# Patient Record
Sex: Female | Born: 1975 | Race: Black or African American | Hispanic: No | Marital: Married | Smoking: Never smoker
Health system: Southern US, Community
[De-identification: ages and names within clinical notes are randomized; demographics above are authoritative.]

## PROBLEM LIST (undated history)

## (undated) DIAGNOSIS — R599 Enlarged lymph nodes, unspecified: Secondary | ICD-10-CM

## (undated) DIAGNOSIS — N920 Excessive and frequent menstruation with regular cycle: Secondary | ICD-10-CM

## (undated) DIAGNOSIS — M1990 Unspecified osteoarthritis, unspecified site: Secondary | ICD-10-CM

## (undated) DIAGNOSIS — D367 Benign neoplasm of other specified sites: Secondary | ICD-10-CM

## (undated) DIAGNOSIS — I739 Peripheral vascular disease, unspecified: Secondary | ICD-10-CM

## (undated) DIAGNOSIS — I639 Cerebral infarction, unspecified: Secondary | ICD-10-CM

## (undated) DIAGNOSIS — K922 Gastrointestinal hemorrhage, unspecified: Secondary | ICD-10-CM

## (undated) DIAGNOSIS — D649 Anemia, unspecified: Secondary | ICD-10-CM

## (undated) DIAGNOSIS — I10 Essential (primary) hypertension: Secondary | ICD-10-CM

## (undated) DIAGNOSIS — F419 Anxiety disorder, unspecified: Secondary | ICD-10-CM

## (undated) DIAGNOSIS — I82409 Acute embolism and thrombosis of unspecified deep veins of unspecified lower extremity: Secondary | ICD-10-CM

## (undated) DIAGNOSIS — I6522 Occlusion and stenosis of left carotid artery: Secondary | ICD-10-CM

## (undated) HISTORY — PX: OTHER SURGICAL HISTORY: SHX169

## (undated) HISTORY — DX: Occlusion and stenosis of left carotid artery: I65.22

## (undated) HISTORY — PX: BREAST SURGERY: SHX581

## (undated) HISTORY — DX: Anemia, unspecified: D64.9

## (undated) HISTORY — DX: Excessive and frequent menstruation with regular cycle: N92.0

---

## 2004-11-26 DIAGNOSIS — R599 Enlarged lymph nodes, unspecified: Secondary | ICD-10-CM

## 2004-11-26 HISTORY — DX: Enlarged lymph nodes, unspecified: R59.9

## 2005-09-22 ENCOUNTER — Emergency Department (HOSPITAL_COMMUNITY): Admission: EM | Admit: 2005-09-22 | Discharge: 2005-09-22 | Payer: Self-pay | Admitting: Family Medicine

## 2005-10-01 ENCOUNTER — Emergency Department (HOSPITAL_COMMUNITY): Admission: EM | Admit: 2005-10-01 | Discharge: 2005-10-01 | Payer: Self-pay | Admitting: Family Medicine

## 2005-10-09 ENCOUNTER — Emergency Department (HOSPITAL_COMMUNITY): Admission: EM | Admit: 2005-10-09 | Discharge: 2005-10-09 | Payer: Self-pay | Admitting: Family Medicine

## 2005-10-19 ENCOUNTER — Ambulatory Visit (HOSPITAL_COMMUNITY): Admission: RE | Admit: 2005-10-19 | Discharge: 2005-10-19 | Payer: Self-pay | Admitting: Family Medicine

## 2005-10-19 ENCOUNTER — Ambulatory Visit: Payer: Self-pay | Admitting: Internal Medicine

## 2005-10-19 ENCOUNTER — Inpatient Hospital Stay (HOSPITAL_COMMUNITY): Admission: EM | Admit: 2005-10-19 | Discharge: 2005-10-25 | Payer: Self-pay | Admitting: Family Medicine

## 2005-10-22 ENCOUNTER — Encounter (INDEPENDENT_AMBULATORY_CARE_PROVIDER_SITE_OTHER): Payer: Self-pay | Admitting: *Deleted

## 2005-12-10 ENCOUNTER — Ambulatory Visit (HOSPITAL_COMMUNITY): Admission: RE | Admit: 2005-12-10 | Discharge: 2005-12-10 | Payer: Self-pay | Admitting: Hospitalist

## 2005-12-10 ENCOUNTER — Ambulatory Visit: Payer: Self-pay | Admitting: Hospitalist

## 2005-12-14 ENCOUNTER — Ambulatory Visit (HOSPITAL_COMMUNITY): Admission: RE | Admit: 2005-12-14 | Discharge: 2005-12-14 | Payer: Self-pay | Admitting: Cardiology

## 2005-12-14 ENCOUNTER — Encounter: Payer: Self-pay | Admitting: Cardiology

## 2005-12-14 ENCOUNTER — Ambulatory Visit: Payer: Self-pay | Admitting: Cardiology

## 2005-12-20 ENCOUNTER — Emergency Department (HOSPITAL_COMMUNITY): Admission: EM | Admit: 2005-12-20 | Discharge: 2005-12-20 | Payer: Self-pay | Admitting: Emergency Medicine

## 2005-12-21 ENCOUNTER — Inpatient Hospital Stay (HOSPITAL_COMMUNITY): Admission: EM | Admit: 2005-12-21 | Discharge: 2005-12-26 | Payer: Self-pay | Admitting: Emergency Medicine

## 2005-12-21 ENCOUNTER — Ambulatory Visit (HOSPITAL_COMMUNITY): Admission: RE | Admit: 2005-12-21 | Discharge: 2005-12-21 | Payer: Self-pay | Admitting: Emergency Medicine

## 2005-12-21 ENCOUNTER — Ambulatory Visit: Payer: Self-pay | Admitting: Internal Medicine

## 2005-12-31 ENCOUNTER — Ambulatory Visit: Payer: Self-pay | Admitting: Internal Medicine

## 2006-01-08 ENCOUNTER — Ambulatory Visit: Payer: Self-pay | Admitting: Internal Medicine

## 2006-01-14 ENCOUNTER — Ambulatory Visit: Payer: Self-pay | Admitting: Internal Medicine

## 2006-01-28 ENCOUNTER — Ambulatory Visit: Payer: Self-pay | Admitting: Hospitalist

## 2006-02-09 ENCOUNTER — Ambulatory Visit: Payer: Self-pay | Admitting: Internal Medicine

## 2006-02-09 ENCOUNTER — Inpatient Hospital Stay (HOSPITAL_COMMUNITY): Admission: EM | Admit: 2006-02-09 | Discharge: 2006-02-14 | Payer: Self-pay | Admitting: Emergency Medicine

## 2006-02-15 ENCOUNTER — Ambulatory Visit: Payer: Self-pay | Admitting: Internal Medicine

## 2006-02-18 ENCOUNTER — Ambulatory Visit: Payer: Self-pay | Admitting: Internal Medicine

## 2006-02-20 ENCOUNTER — Ambulatory Visit: Payer: Self-pay | Admitting: Internal Medicine

## 2006-02-25 ENCOUNTER — Ambulatory Visit: Payer: Self-pay | Admitting: Hospitalist

## 2006-03-07 ENCOUNTER — Ambulatory Visit: Payer: Self-pay | Admitting: Internal Medicine

## 2006-03-11 ENCOUNTER — Ambulatory Visit: Payer: Self-pay | Admitting: Internal Medicine

## 2006-03-18 ENCOUNTER — Ambulatory Visit: Payer: Self-pay | Admitting: Internal Medicine

## 2006-03-25 ENCOUNTER — Ambulatory Visit: Payer: Self-pay | Admitting: Internal Medicine

## 2006-04-01 ENCOUNTER — Ambulatory Visit: Payer: Self-pay | Admitting: Internal Medicine

## 2006-04-19 ENCOUNTER — Encounter: Admission: RE | Admit: 2006-04-19 | Discharge: 2006-04-19 | Payer: Self-pay

## 2006-04-30 ENCOUNTER — Ambulatory Visit: Payer: Self-pay | Admitting: Internal Medicine

## 2006-05-04 ENCOUNTER — Ambulatory Visit (HOSPITAL_COMMUNITY): Admission: RE | Admit: 2006-05-04 | Discharge: 2006-05-04 | Payer: Self-pay | Admitting: Emergency Medicine

## 2006-05-04 ENCOUNTER — Encounter: Payer: Self-pay | Admitting: Vascular Surgery

## 2006-06-08 ENCOUNTER — Encounter: Payer: Self-pay | Admitting: Vascular Surgery

## 2006-06-08 ENCOUNTER — Emergency Department (HOSPITAL_COMMUNITY): Admission: EM | Admit: 2006-06-08 | Discharge: 2006-06-08 | Payer: Self-pay | Admitting: Emergency Medicine

## 2006-06-10 ENCOUNTER — Ambulatory Visit: Payer: Self-pay | Admitting: Internal Medicine

## 2006-06-10 ENCOUNTER — Ambulatory Visit (HOSPITAL_COMMUNITY): Admission: RE | Admit: 2006-06-10 | Discharge: 2006-06-10 | Payer: Self-pay | Admitting: Internal Medicine

## 2006-09-04 DIAGNOSIS — N83209 Unspecified ovarian cyst, unspecified side: Secondary | ICD-10-CM | POA: Insufficient documentation

## 2006-09-04 DIAGNOSIS — F3289 Other specified depressive episodes: Secondary | ICD-10-CM | POA: Insufficient documentation

## 2006-09-04 DIAGNOSIS — I749 Embolism and thrombosis of unspecified artery: Secondary | ICD-10-CM | POA: Insufficient documentation

## 2006-09-04 DIAGNOSIS — K5900 Constipation, unspecified: Secondary | ICD-10-CM | POA: Insufficient documentation

## 2006-09-04 DIAGNOSIS — M314 Aortic arch syndrome [Takayasu]: Secondary | ICD-10-CM

## 2006-09-04 DIAGNOSIS — M216X9 Other acquired deformities of unspecified foot: Secondary | ICD-10-CM

## 2006-09-04 DIAGNOSIS — F329 Major depressive disorder, single episode, unspecified: Secondary | ICD-10-CM

## 2006-09-04 DIAGNOSIS — D509 Iron deficiency anemia, unspecified: Secondary | ICD-10-CM

## 2006-09-14 ENCOUNTER — Encounter: Payer: Self-pay | Admitting: Vascular Surgery

## 2006-09-14 ENCOUNTER — Ambulatory Visit (HOSPITAL_COMMUNITY): Admission: RE | Admit: 2006-09-14 | Discharge: 2006-09-14 | Payer: Self-pay | Admitting: Family Medicine

## 2006-10-29 ENCOUNTER — Ambulatory Visit (HOSPITAL_COMMUNITY): Admission: RE | Admit: 2006-10-29 | Discharge: 2006-10-29 | Payer: Self-pay | Admitting: Vascular Surgery

## 2006-11-05 ENCOUNTER — Ambulatory Visit (HOSPITAL_COMMUNITY): Admission: RE | Admit: 2006-11-05 | Discharge: 2006-11-05 | Payer: Self-pay | Admitting: Vascular Surgery

## 2006-11-07 ENCOUNTER — Emergency Department (HOSPITAL_COMMUNITY): Admission: EM | Admit: 2006-11-07 | Discharge: 2006-11-08 | Payer: Self-pay | Admitting: Emergency Medicine

## 2006-11-08 ENCOUNTER — Ambulatory Visit (HOSPITAL_COMMUNITY): Admission: RE | Admit: 2006-11-08 | Discharge: 2006-11-08 | Payer: Self-pay | Admitting: *Deleted

## 2007-09-19 ENCOUNTER — Telehealth: Payer: Self-pay | Admitting: *Deleted

## 2008-01-02 ENCOUNTER — Ambulatory Visit: Payer: Self-pay | Admitting: Vascular Surgery

## 2008-01-02 ENCOUNTER — Encounter (INDEPENDENT_AMBULATORY_CARE_PROVIDER_SITE_OTHER): Payer: Self-pay | Admitting: Emergency Medicine

## 2008-01-02 ENCOUNTER — Ambulatory Visit (HOSPITAL_COMMUNITY): Admission: RE | Admit: 2008-01-02 | Discharge: 2008-01-02 | Payer: Self-pay | Admitting: Emergency Medicine

## 2008-02-03 ENCOUNTER — Emergency Department (HOSPITAL_COMMUNITY): Admission: EM | Admit: 2008-02-03 | Discharge: 2008-02-03 | Payer: Self-pay | Admitting: Family Medicine

## 2008-05-18 ENCOUNTER — Emergency Department (HOSPITAL_COMMUNITY): Admission: EM | Admit: 2008-05-18 | Discharge: 2008-05-18 | Payer: Self-pay | Admitting: Emergency Medicine

## 2009-01-11 ENCOUNTER — Emergency Department (HOSPITAL_COMMUNITY): Admission: EM | Admit: 2009-01-11 | Discharge: 2009-01-11 | Payer: Self-pay | Admitting: Emergency Medicine

## 2009-01-16 ENCOUNTER — Emergency Department (HOSPITAL_COMMUNITY): Admission: EM | Admit: 2009-01-16 | Discharge: 2009-01-16 | Payer: Self-pay | Admitting: Emergency Medicine

## 2009-02-02 ENCOUNTER — Ambulatory Visit: Payer: Self-pay | Admitting: Vascular Surgery

## 2010-12-12 ENCOUNTER — Encounter (INDEPENDENT_AMBULATORY_CARE_PROVIDER_SITE_OTHER): Payer: Self-pay | Admitting: Internal Medicine

## 2010-12-12 ENCOUNTER — Encounter (INDEPENDENT_AMBULATORY_CARE_PROVIDER_SITE_OTHER): Payer: Self-pay | Admitting: Emergency Medicine

## 2010-12-12 ENCOUNTER — Inpatient Hospital Stay (HOSPITAL_COMMUNITY)
Admission: EM | Admit: 2010-12-12 | Discharge: 2010-12-13 | Payer: Self-pay | Source: Home / Self Care | Attending: Internal Medicine | Admitting: Internal Medicine

## 2010-12-13 LAB — LIPID PANEL
Cholesterol: 122 mg/dL (ref 0–200)
HDL: 63 mg/dL (ref >39)
LDL Cholesterol: 52 mg/dL (ref 0–99)
Total CHOL/HDL Ratio: 1.9 RATIO
Triglycerides: 35 mg/dL (ref <150)
VLDL: 7 mg/dL (ref 0–40)

## 2010-12-13 LAB — PROTIME-INR
INR: 1.2 (ref 0.00–1.49)
INR: 1.24 (ref 0.00–1.49)
Prothrombin Time: 15.5 seconds — ABNORMAL HIGH (ref 11.6–15.2)
Prothrombin Time: 15.8 seconds — ABNORMAL HIGH (ref 11.6–15.2)

## 2010-12-13 LAB — COMPREHENSIVE METABOLIC PANEL
ALT: <8 U/L (ref 0–35)
AST: 15 U/L (ref 0–37)
Albumin: 2.9 g/dL — ABNORMAL LOW (ref 3.5–5.2)
Alkaline Phosphatase: 66 U/L (ref 39–117)
BUN: 5 mg/dL — ABNORMAL LOW (ref 6–23)
CO2: 25 mEq/L (ref 19–32)
Calcium: 8.6 mg/dL (ref 8.4–10.5)
Chloride: 106 mEq/L (ref 96–112)
Creatinine, Ser: 0.71 mg/dL (ref 0.4–1.2)
GFR calc Af Amer: >60 mL/min (ref >60)
GFR calc non Af Amer: >60 mL/min (ref >60)
Glucose, Bld: 108 mg/dL — ABNORMAL HIGH (ref 70–99)
Potassium: 3.5 mEq/L (ref 3.5–5.1)
Sodium: 139 mEq/L (ref 135–145)
Total Bilirubin: 0.2 mg/dL — ABNORMAL LOW (ref 0.3–1.2)
Total Protein: 8 g/dL (ref 6.0–8.3)

## 2010-12-13 LAB — CK TOTAL AND CKMB (NOT AT ARMC)
CK, MB: 0.6 ng/mL (ref 0.3–4.0)
CK, MB: 0.6 ng/mL (ref 0.3–4.0)
CK, MB: 0.6 ng/mL (ref 0.3–4.0)
Relative Index: INVALID (ref 0.0–2.5)
Relative Index: INVALID (ref 0.0–2.5)
Relative Index: INVALID (ref 0.0–2.5)
Total CK: 90 U/L (ref 7–177)
Total CK: 85 U/L (ref 7–177)
Total CK: 81 U/L (ref 7–177)

## 2010-12-13 LAB — CBC
HCT: 25.7 % — ABNORMAL LOW (ref 36.0–46.0)
Hemoglobin: 7.9 g/dL — ABNORMAL LOW (ref 12.0–15.0)
MCH: 22.3 pg — ABNORMAL LOW (ref 26.0–34.0)
MCHC: 30.7 g/dL (ref 30.0–36.0)
MCV: 72.6 fL — ABNORMAL LOW (ref 78.0–100.0)
Platelets: 525 10*3/uL — ABNORMAL HIGH (ref 150–400)
RBC: 3.54 MIL/uL — ABNORMAL LOW (ref 3.87–5.11)
RDW: 19.4 % — ABNORMAL HIGH (ref 11.5–15.5)
WBC: 11.5 10*3/uL — ABNORMAL HIGH (ref 4.0–10.5)

## 2010-12-13 LAB — IRON AND TIBC
Iron: <10 ug/dL — ABNORMAL LOW (ref 42–135)
UIBC: 303 ug/dL

## 2010-12-13 LAB — DIFFERENTIAL
Basophils Absolute: 0 10*3/uL (ref 0.0–0.1)
Basophils Relative: 0 % (ref 0–1)
Eosinophils Absolute: 0.1 10*3/uL (ref 0.0–0.7)
Eosinophils Relative: 0 % (ref 0–5)
Lymphocytes Relative: 17 % (ref 12–46)
Lymphs Abs: 1.9 10*3/uL (ref 0.7–4.0)
Monocytes Absolute: 0.6 10*3/uL (ref 0.1–1.0)
Monocytes Relative: 5 % (ref 3–12)
Neutro Abs: 8.9 10*3/uL — ABNORMAL HIGH (ref 1.7–7.7)
Neutrophils Relative %: 77 % (ref 43–77)

## 2010-12-13 LAB — CROSSMATCH
ABO/RH(D): B POS
Antibody Screen: NEGATIVE

## 2010-12-13 LAB — FERRITIN: Ferritin: 7 ng/mL — ABNORMAL LOW (ref 10–291)

## 2010-12-13 LAB — TSH: TSH: 1.591 u[IU]/mL (ref 0.350–4.500)

## 2010-12-13 LAB — TROPONIN I
Troponin I: 0.01 ng/mL (ref 0.00–0.06)
Troponin I: 0.01 ng/mL (ref 0.00–0.06)
Troponin I: <0.01 ng/mL (ref 0.00–0.06)

## 2010-12-13 LAB — FOLATE: Folate: 8.4 ng/mL

## 2010-12-13 LAB — APTT: aPTT: 34 seconds (ref 24–37)

## 2010-12-13 LAB — BRAIN NATRIURETIC PEPTIDE: Pro B Natriuretic peptide (BNP): 103 pg/mL — ABNORMAL HIGH (ref 0.0–100.0)

## 2010-12-13 LAB — VITAMIN B12: Vitamin B-12: 464 pg/mL (ref 211–911)

## 2010-12-14 NOTE — Discharge Summary (Signed)
NAMEJULIANAH, [name]                  ACCOUNT NO.:  [id number]  MEDICAL RECORD NO.:  [id number]          PATIENT TYPE:  INP  LOCATION:  5522                         FACILITY:  [facility]  PHYSICIAN:  Lonia Blood, M.D.       DATE OF BIRTH:  [birth year]  DATE OF ADMISSION:  12/12/2010 DATE OF DISCHARGE:  12/13/2010                              DISCHARGE SUMMARY   PRIMARY CARE PHYSICIAN:  This patient goes to [facility].  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 3. Anemia most likely due to combination of iron deficiency and     chronic disease. 4. Recurrent deep venous thrombosis, status post inferior vena cava     filter placement.  The patient is to be on Coumadin, but she took     herself off Coumadin. 5. Chronic insomnia and anxiety.  DISCHARGE MEDICATIONS: 1. Tandem Plus one capsule by mouth three times a day. 2. Aspirin 81 mg daily. 3. Restoril 15 mg by mouth at bedtime as needed for sleep. 4. Xanax 0.5 mg by mouth twice a day as needed for anxiety.  CONDITION AT DISCHARGE:  [name] was discharged in good condition.  VITAL SIGNS AT TIME OF DISCHARGE:  Temperature 97.4, blood pressure 123/72, heart rate 94, respiration 18, saturating 97% on room air.  The patient will follow up with Dr. Alben Deeds from [facility] for rheumatology purposes and Takayasu arteritis. The patient will also follow up with her primary care physician for insomnia and chronic anxiety.  PROCEDURES DURING THIS ADMISSION: 1. The patient underwent CT scan of the chest with intravenous     contrast, which was negative for pulmonary emboli, progressive     circumferential thickening of the thoracic aorta compatible with     Takayasu arteritis. 2. MRA of the head, findings of occluded left internal carotid artery,     left vertebral artery with severe disease, occluded left common     artery  and left subclavian artery.  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 3. 2D echo - preserved EF, papillary musclein the LV HISTORY AND PHYSICAL:  Refer to the dictated H and P done by Dr. Mikeal Hawthorne.  HOSPITAL COURSE: 1. [name] is a 35 year old woman with known Takayasu arteritis,     chronically occluded left carotid and left subclavian, presented to     the emergency room with complaints of pain, headaches, and body     aches.  She had a CT scan of her chest, which showed thickening of     the thoracic aorta.  The patient was given IV steroids and on May 14, 2011, I personally discussed her case with Dr. Alben Deeds     from Rheumatology.  He recommended close followup in the office to     decide how much steroids to give if any in the future. 2. Anemia.  Studies are consistent with iron deficiency with a     ferritin level of 7.  The patient is a  young woman with heavy     menses, so she is going to be prescribed Tandem Plus iron therapy     and folic acid, to be able to make up for the menstrual blood loss. 3. History of recurrent deep venous thrombosis.  The patient has an     IVC filter in place.  She has not taken Coumadin in over 2 years.     Her INR on admission was 1.2.  I have discuss her case with Dr.     Alben Deeds and he is going to assess the patient in the office     and then probably refer her to Hematology as an outpatient. 4. Chronic insomnia and anxiety.  The patient will continue on Xanax     as needed and she was prescribed Restoril for sleep.     Lonia Blood, M.D.     SL/MEDQ  D:  12/13/2010  T:  12/14/2010  Job:  [id number]  cc:   Alben Deeds, MD  Electronically Signed by Lonia Blood M.D. on 12/14/2010 03:32:08 PM

## 2010-12-16 ENCOUNTER — Encounter: Payer: Self-pay | Admitting: Emergency Medicine

## 2010-12-18 LAB — CBC
HCT: 22.7 % — ABNORMAL LOW (ref 36.0–46.0)
MCH: 21.9 pg — ABNORMAL LOW (ref 26.0–34.0)
MCHC: 30.4 g/dL (ref 30.0–36.0)
Platelets: 484 10*3/uL — ABNORMAL HIGH (ref 150–400)
RBC: 3.15 MIL/uL — ABNORMAL LOW (ref 3.87–5.11)
WBC: 14.2 10*3/uL — ABNORMAL HIGH (ref 4.0–10.5)

## 2010-12-18 LAB — ANA: Anti Nuclear Antibody(ANA): NEGATIVE

## 2010-12-18 LAB — COMPREHENSIVE METABOLIC PANEL
ALT: <8 U/L (ref 0–35)
AST: 12 U/L (ref 0–37)
Alkaline Phosphatase: 54 U/L (ref 39–117)
BUN: 4 mg/dL — ABNORMAL LOW (ref 6–23)
Calcium: 8.4 mg/dL (ref 8.4–10.5)
Creatinine, Ser: 0.6 mg/dL (ref 0.4–1.2)
Potassium: 3.6 mEq/L (ref 3.5–5.1)
Total Bilirubin: 0.2 mg/dL — ABNORMAL LOW (ref 0.3–1.2)
Total Protein: 6.7 g/dL (ref 6.0–8.3)

## 2011-03-13 LAB — PROTIME-INR: INR: 1.8 — ABNORMAL HIGH (ref 0.00–1.49)

## 2011-04-10 NOTE — Assessment & Plan Note (Signed)
OFFICE VISIT   [name], [name]  DOB:  [birth year]                                       02/02/2009  ZHYQM#:[id number]   The patient is a 35 year old female with history of hypercoagulable  state and presumed Takayasu arteritis.  She was last seen in November of  2007.  At that time we placed an inferior vena cava filter due to the  fact that she had had multiple DVTs in her lower extremities previously.  She has had intermittent episodes of pain and swelling in her lower  extremities due to these DVTs.  She most likely has some component of  postphlebitic syndrome.  She states that currently she has occasional  pain in her legs.  This is made worse when she is standing on her legs  during the day.  She has been asymptomatic as far as her Takayasu is  concerned.  She has previously known occlusion of the left common  carotid and subclavian artery from her Takayasu.  She has been  asymptomatic from these.   On physical exam today she has 2+ femoral and pedal pulses bilaterally.  Her legs are symmetric in size with no significant edema today.  Abdomen  is soft, nontender with no pulsatile mass.   In summary, the patient has a history of what is thought to be Takayasu  arteritis with occlusion of her left common carotid and left subclavian  artery.  These are essentially asymptomatic.  Additionally, she probably  has some sort of hypercoagulable state which has caused her to have  multiple DVTs in the past.  She has previously had an inferior vena cava  filter placed for protection against pulmonary embolus if she develops  recurrent DVT.  She has some component of postphlebitic syndrome which  consists of intermittent leg swelling, heaviness and pain in her legs if  she is standing for lengthy period of times.  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  Although not debilitating  she will have some nuisance symptoms from this long-term and  this will  probably not improve significantly over time.  Primary treatment for  this will be elevation of her legs or compression stockings if she has  development of severe edema.  The patient would be able to do jobs that  do not require her to stand on her feet for lengthy periods of time or  if she has a job where she can elevate her legs intermittently or wear  compression stockings for some alleviation of her symptoms.  All of this  was discussed with the patient today during her office visit.  She will  follow up with me on an as-needed basis.   Janetta Hora. Fields, MD  Electronically Signed   CEF/MEDQ  D:  02/03/2009  T:  02/03/2009  Job:  [id number]   cc:   Nance Pew

## 2011-04-13 NOTE — Discharge Summary (Signed)
[name], [name]                  ACCOUNT NO.:  [id number]   MEDICAL RECORD NO.:  [id number]          PATIENT TYPE:  INP   LOCATION:  3002                         FACILITY:  [facility]   PHYSICIAN:  C. Ulyess Mort, M.D.DATE OF BIRTH:  Mar 05, 1976   DATE OF ADMISSION:  10/19/2005  DATE OF DISCHARGE:  10/25/2005                                 DISCHARGE SUMMARY   DISCHARGE DIAGNOSES:  1.  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  2.  Normocytic anemia.  3.  Thrombocytopenia (likely secondary to inflammatory process).  4.  Hypoalbuminemia, thought to be secondary to poor p.o. intake and chronic      disease.  5.  Hypokalemia, resolved.  6.  History of deep vein thrombosis on the left while pregnant in 1995      (treated with heparin for 7-1/2 months until she gave birth, and then      Coumadin for less than 1 year.  7.  History of one vaginal birth.  8.  History of multiple emergency room visits in October and November of      2006 for difficulty breathing, body aches, and chest pain associated      with the present illness.   DISCHARGE MEDICATIONS:  1.  Prednisone 60 mg p.o. daily.  2.  Aspirin 325 mg daily.  3.  Calcium with vitamin D 1000 mg daily.  4.  Protonix 40 mg daily.   PROCEDURES:  On October 22, 2005, she underwent bronchoscopy and  mediastinoscopy performed by Dr. Viviann Spare C. Hendrickson to evaluate a  mediastinal mass versus a vasculitic mass, and it was found to be  vasculitic.  Biopsies from the innominate artery and lymph nodes were sent  for analysis.  The biopsy showed no evidence of malignant tumor seen on any  of the lymph nodes, and a biopsy of the innominate artery adventitious,  showing fragments of fibroadipose tissue with mild chronic inflammation.  There was also a biopsy of the mediastinal soft tissue mass which showed  fragments of normal involuted thiamine, and the comments stated that some of  the  inflammatory infiltrate is concentrated about vessels, but there is no  clear cut evidence of vasculitis or infiltration of the vessel walls by the  inflammatory infiltrate.  The tissues are extensively destroyed by freezing  artifact.  There is no evidence of vasculitis.  The tissue has extensively  distorted by freezing artifact, as previously noted.  The case was forwarded  to Dr. Orlando Penner at [facility] for his expert opinion.  He  concurred with their initial evaluation.  He agrees that although the  material submitted was not diagnostic for Takayasu's arteritis, vascular  media where the pathologic changes would be expected to occur, had not  sampled due to obvious clinical limitations.  If lead biopsy is a concern in  the future, frozen section evaluation is not suggested in order to preserve  morphology.  There is no evidence of malignancy in the materials submitted.   On October 19, 2005, a CT angiogram  of the chest was performed which showed  no evidence of acute pulmonary embolism to the chest, but abnormal and  infiltrative mediastinal mass/adenopathy in the left paratracheal, left  hilum, and about the descending thoracic aorta was noted.  Associated  thickening of the thoracic aortic wall and occlusions of the left common  carotid and subclavian arteries were noted.  Again, considerations would  include an infiltrative neoplastic process through the mediastinum such as  small cell lung carcinoma, infiltrate of lymphadenopathy and vasculitic  etiologies including giant cell arteritis, and collagen vascular disease.  On October 22, 2005, a portable chest x-ray showed low volume films without  evidence of pneumothorax with a prominent gastric bubble.   CONSULTATIONS:  1.  Dr. Donata Clay of cardiothoracic surgery and Dr. Chrissie Noa T.  Rowe of      rheumatology.  For a full H&P, please consult the chart, but in brief,      Mr. Marden Noble is a 35 year old African-American  woman with a 5-week history      of upper back pain that was bilateral at first, but then became      predominantly left-sided, for which she visited urgent care multiple      times.  The pain started roughly 5 weeks ago, but on further recall, she      felt like it had been going on longer than that.  It had been constant      since then.  At one time, it worsened  with deep inspiration, but at the      time of presentation, it did not correlate, as well.  She noted an      occasional dry cough, constant fevers x5 weeks, night sweats, no acute      distress and weight loss secondary to loss of sensation of appetite.      She has also felt cold, and her hands stay cold.  She came into the ED      this morning because she had been taking so much Motrin, about 800 mg 3-      4 times a day, simply to control the pain.  It worked well, but she felt      like it was too much to be taking.  Sometimes, she can feel the back      pain retrosternally in her chest, and it will also radiate to her left      shoulder.  She complains that the left-sided pain is so severe she      cannot sleep on her left side.  She has no primary care physician.   SOCIAL HISTORY:  On presentation, this is a woman with no tobacco history,  no alcohol, no drugs.  She is married.  She is a Consulting civil engineer at [employer].  Her  husband is an active duty Medical laboratory scientific officer.   FAMILY HISTORY:  Her mother had a bypass in 2000, and has a cerebrovascular  accident.  Father had any aneurysm and arthritis.  He has one 73 year old  son, and a healthy sister is 71 yards old.   REVIEW OF SYSTEMS:  Positive for everything in the HPI including also  fatigue, diaphoresis, nose bleed, palpitations, headache, and blurry vision.  Her last menstrual period was September 26, 2005.   PHYSICAL EXAMINATION:  VITAL SIGNS:  Temperature ranged between 98.5 and  100.4 in the ED, blood pressure 137-152/76-109, pulse 90-120, respiration rte 16-20, oxygen saturation  99% on room air.  GENERAL:  She is alert  and oriented, in mild distress prior to repeating  Dilaudid in the emergency room, but then her pain decreased.  She was very  pleasant.  Eyes - pupils equal, round and reactive to light and  accommodation.  Extraocular muscles intake.  Sclerae were anicteric.  Oropharynx was clear with good.  NECK:  Supple with no lymphadenopathy.  LUNGS:  Clear to auscultation bilaterally.  No wheezes, no rhonchi.  No  tenderness on her back with percussion.  Please note - the patient had just  received Dilaudid prior to the exam.  Heart rate as tachycardic with no  murmurs, rubs, or gallops.  She had normal sinus rhythm on telemetry.  ABDOMEN:  Nontender, nondistended.  Positive bowel sounds.  EXTREMITIES:  Mild cyanosis in her hands.  No clubbing, no edema  bilaterally.  SKIN:  Cool and dry.  No lymphadenopathy cervically, post-auricularly, and  submandibularly.  MUSCULOSKELETAL:  She had a questionable 3 gm lump beneath the left scapula  that was only noticeable when pointed out by the patient.  It seemed  muscular.  NEUROLOGIC:  She was intact.  Cranial nerves II-XII - strength was 5/5  bilaterally in the upper and lower extremities, and sensation was intact to  light touch bilaterally.  She had a normal gait.  PSYCHIATRIC:  She was appropriate.   LABORATORY DATA:  Sodium was slightly depressed to 133, potassium 3.8,  chloride 103, bicarbonate 25, BUN 3, creatinine 0.7.  Glucose 94, bilirubin  0.4, alkaline phosphatase 66.  AST 13, ALT 8.  Protein 7.7, albumin  depressed at 2.4, calcium 8.7.  PTT 35, PT 14.3, INR 1.1.  D-dimer 1.07.  Hemoglobin 9.8, hematocrit 30.2, MCV 79, white blood cell count 12.7.  ANC  was 10.9, and platelets were elevated at 44.  She had 86% neutrophils, with  10% lymphocytes, and 3% monocytes.  A reticulocyte count was done which  showed 2.7%.  An erythrocyte sedimentation rate was 126, iron was 18,  slightly depressed, ferritin  97, Foley catheter 7.9.  Urinalysis showed only  a specific gravity that was slightly elevated at 1.046.  Urine drug screen  was positive for opiates, but she had received opiates in the emergency  room.   HOSPITAL COURSE:  1.  Mediastinal mass.  The differential diagnosis is large, and with her      history of, we were concerned about neoplasm, either benign or      malignant.  Therefore, cardiovascular surgery was consulted for a      biopsy, which was performed shortly after admission.  The surgeon      reported, after the mediastinoscopy, that the mass did not appear to be      neoplastic, and instead appeared to be vasculitic.  Therefore, we      pursued a rheumatology consultation after drawing various labs to work-      up a possible vasculitic process.  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     Nevertheless, the rarity of this condition, and the fact that this      patient is African-American and not [place], as is usually seen with      Takayasu's', we remained cautious in making this diagnosis.  The      pathology had to be sent out to an expert at [facility]  for      analysis due to its poor quality and the rarity of this disease.      Therefore, we conducted most of our work-up without having tissue      diagnosis.  The specific things that made [place] suspect Takayasu's' were      that the patient actually later on was found to have a decreased      brachial pulse on her left arm and absent carotid pulse on the left, and      that blood pressures were not obtainable on the left arm.  She states      that this has been this way for years.  She also met 3 or more out of 6      of the classification criteria for Takayasu's' arteritis, and if 3 out      of the 6 are met, there is a 90.5% sensitivity and a 97.8% specificity      for this  disease.  Specifically, (1) she is younger than 37 years old,      (2) she does have a systolic blood pressure difference in her arms with      inability to actually get a systolic blood pressure in her left arm, (3)      she has a decreased brachial artery pulse, and (4) there is a question      of claudication of her extremity.  She does have a DVT history, but we      question whether or not there was an arterial component at that time.      There is also in the criterion bruits, whether or not bruits are present      over the subclavian and whether or not an aortogram shows involvement of      the aorta.  Of course, we do not have an aortogram, but the CT does      stress involvement of the aorta, the subclavian artery, and t4he carotid      artery.  Therefore, she may even have more than just 4 out of the 6      criterion, possibly up to 5 or more.  Nevertheless, the pathology came      back to be nonspecific, as it was unable to sample the media of the      arterial wall, which would be the part that would be affected by      Takayasu's'.  Furthermore, the quality of the tissue was disrupted by      the fact that it was frozen.   We checked a vast array of immunologic studies to evaluate the possibility  that she might had Takayasu's' or some other autoimmune disorder, and while  they support this diagnosis for Takayasu's', there are certainly some  positive studies, but those are not entirely in line with the disease as it  is characterized.  I will describe these studies, namely - she had a normal  PT and PTT, which is usually abnormal on antiphospholipid antibody syndrome,  even though she did have positive antiphospholipid antibodies.  The PT TLA  was 45.2, which is elevated.  The PT TLA 4 to 1 mix was 45.5, which is  elevated.  The DRVVT was elevated at 43.2, and lupus anticoagulant was  affected.  Phosphatidyl serine IgG antibody was at level 3, as was the IgM antibody, which  basically means that they are absent.  The same applies for  the IgA antibody, which is  at a level 2, indicating that time was absent.  On the test, it is noted that the detection of anti-phosphatidyl serine  antibodies by ELIZA are recommended for the diagnosis of antiphospholipid  syndrome, and these were negative in her.  She also had negative cardiolipin  antibody IgG at level 8, and IgM, which was also at a level of 8, and IgA,  which was a level of 2 - so all those were negative, as well.   She did have signs of chronic inflammation with her thrombocytosis, her  elevated sedimentation rates, which, when she came in, was 126, and by the  time she was discharged was 106, and that was after IV Solu-Medrol.  Her  antinuclear antibody was negative, and her Smith (ENA) antibody IgG was  negative, as was her double-stranded DNA antibody; both of those were  negative.  The Ro (SSA) and La (SSB) antibodies were likewise negative, as  was the ENA (RNP) antibody.  These scleroderma antibody was also negative.  A protein electrophoresis was performed showing a total protein of 7.5  gm/dl, serum albumin of 16.1%, alpha I of 9%, which is elevated, alpha II of  17.1%, which is elevated, beta of 5.7%, which is normal, beta II of 10%,  which is elevated, and gamma globulin at 25.1%, which is elevated.  They  noted that the possibility of a faint restricted band cannot be completely  excluded in the gamma region.  IgA level was 483, which is elevated.  IgG  was 2060, which is elevated.  IgM was 122, which was normal.  The  interpretation of the immunofixation was that there was no monoclonal  protein identified.  C reactive protein was measured on the day of  discharge, and it was 1.5, which is elevated.  The erythrocyte sedimentation  rate was also unmeasured at that time, and, as previously mentioned, was  106.  Updates on the ANCA total autoantibody and antinuclear antibody tests  came on October 30, 2005, and no antibody was detected of either of these 2  types.   Despite the absence of a fixed diagnosis, the patient was treated for her  autoimmune disease with IV Solu-Medrol, and she was 40 mg q.6h. for 3 days;  it was then switched to prednisone 60 mg daily.  She also was treated with  an aspirin 325 mg p.o. daily, and we encouraged ambulation.  She was given  calcium with vitamin D 500 mg t.i.d. to protect her against the osteoporosis  that can come with high-dose steroid use.  These orders were implemented as  part of the plan discussed with Dr. Phylliss Bob.  He plans to follow up with her as  an outpatient and to initiate methotrexate at that time.  Also, if she is  tolerating her discharge medications, he plans to start Fosamax as further  osteoporosis prophylaxis for her anticipated long-term prednisone treatment.   [name] was unsure as to whether or not she will stay in the area.  She was  given follow up with Dr. Eliseo Gum so that she would have a primary care physician.  She may be moving back to [place], [place],  however.  She is married to a Hotel manager man.  She may find that she can visit  a rheumatologist in the area where she ends up.   Please also note that we started her on a proton pump inhibitor for GI  protection, since she will be on the long-term steroids.  We discussed with  her the side effects of chronic steroid use, and she expressed  understanding.  She was happy to be experiencing relief from her pain.  In  fact, the most relief came from Toradol, and she did use Dilaudid  occasionally for her pain.  At the time of discharge, she was pain free and  quite happy.   FOLLOW UP:  1.  Her follow up appointment with Dr. Phylliss Bob in rheumatology is October 31, 2005 at 11:30 a.m.  2.  Followed by Dr. Eliseo Gum in [facility] on      October 30, 2005 at 2:30 p.m.  3.  Follow up with Dr. Donata Clay in cardiothoracic surgery is  November 09, 2005 at 11 a.m.   IMPRESSION:  1.  Normocytic anemia.  [name] had relatively normal iron panel, except      for decreased iron, but the element of that pain, which is most specific      for iron-deficiency anemia, which is ferritin, was normal.  The ferritin      would be expected to be elevated in an inflammatory illness, and it is      possible that it could be softly elevated in person with baseline iron-      deficiency anemia.  However, she did have normocytic anemia, and it was      felt that her anemia, which had a baseline hemoglobin of about 9.2 to      about 10, largely reflected her chronic illness of this inflammation      that had been going on for at least 5 weeks, if not longer.  Therefore,      she was not sent out on iron, but that may be added as an outpatient, if      necessary.  She was treated with iron as an inpatient.  Of note, her      hemoglobin did drop as low at 8.5 while she was in the hospital, and on      discharge it bounced back up a little bit to 8.7.  She did remain      asymptomatic throughout at this time.  2.  Thrombocytosis.  This is felt to represent the general inflammatory      response, as platelets can be acute phase reactive.  They should be      followed as an outpatient.  They will be expected to fall over time with      treatment of her inflammatory illness with steroids.  3.  Hypoalbuminemia.  The patient did endorse poor p.o. intake for weeks      secondary to the malaise that she experienced with her chronic illness.      She was supplemented with protein drinks and encouraged to take in food.      She compiled willingly, and it was felt that she will respond, as her      inflammatory illness is controlled.   PHYSICAL EXAMINATION:  GENERAL:  On the day of discharge, [name] was  without any pain or discomfort.  She had no shortness of breath and no  overnight events.  She was alert and oriented, in no acute distress,  lying  in bed comfortably. VITAL SIGNS:  T-max was 97.9, blood pressure 126/75, pulse 86, respirations  20, satting 97% on room air.  CHEST:  Her incision from her mediastinoscopy was clean, dry, and  intact.  HEART:  Regular rate and rhythm.  No murmurs, rubs, or gallops.  LUNGS:  Clear to auscultation bilaterally.  ABDOMEN:  No abdominal tenderness.  No masses.  EXTREMITIES:  No edema.  No calf tenderness.  NEUROLOGIC:  She is alert and oriented x3.   LABORATORY DATA:  Hemoglobin 8.7, hematocrit 26.4, white count 19.5,  platelets 715.  Please note that this leukocytosis was noted after high-dose  IV Solu-Medrol was initiated, and was felt to be secondary to this  treatment.  Sodium 141, potassium 3.8, chloride 105, bicarbonate 30, BUN 10,  creatinine 0.7, glucose 120.  The only pending labs on discharge were the  biopsy pathology report, which is described in this discharge summary.      Clearance Coots, M.D.    ______________________________  C. Ulyess Mort, M.D.    IN/MEDQ  D:  11/01/2005  T:  11/01/2005  Job:  [id number]   cc:   Kerin Perna, M.D.  [address]  [place]  [place] [address]   Eliseo Gum, M.D.  Fax: [phone]   Areatha Keas, M.D.  Fax: [phone]

## 2011-04-13 NOTE — Discharge Summary (Signed)
[name], [name]                  ACCOUNT NO.:  [id number]   MEDICAL RECORD NO.:  [id number]          PATIENT TYPE:  INP   LOCATION:  2031                         FACILITY:  [facility]   PHYSICIAN:  Ileana Roup, M.D.  DATE OF BIRTH:  1976/08/21   DATE OF ADMISSION:  02/09/2006  DATE OF DISCHARGE:  02/14/2006                                 DISCHARGE SUMMARY   PRIMARY CARE Jakiyah Stepney:  Eliseo Gum, M.D.   DISCHARGE DIAGNOSES:  1.  Left calf deep venous thrombosis, recurrent (previous deep venous      thrombosis in January 2007 and 1995 during pregnancy).  2.  Left foot drop.  3.  Takayasu's arteritis.  4.  Iron-deficiency anemia.  5.  Depression.  6.  Constipation.  7.  Ovarian cyst.  8.  Menorrhagia.   DISCHARGE MEDICATIONS:  1.  Coumadin 5 mg p.o. daily, taken at 6 p.m.  2.  Calcium carbonate 500 mg p.o. daily.  3.  Folic acid 1 mg p.o. daily.  4.  Methotrexate 12.5 mg p.o. every Thursday.  5.  Prednisone 10 mg p.o. daily.  6.  Colace 100 mg p.o. daily.  7.  Ambien CR 12.5 mg p.o. at bedtime.  8.  Iron sulfate 325 mg p.o. t.i.d.  9.  Percocet 5/325 mg 1-2 tabs q.4h. p.r.n., dispense #30 for pain.   DISPOSITION:  At the time of discharge, [name] was not experiencing any  pain in her left ankle and calf at rest. She did have a moderate amount of  pain when moving, but this was much improved over the time of her admission.  She was able to ambulate well without crutches. The swelling in her left  ankle was improved compared to that at the time of her admission as well.   FOLLOWUP:  1.  The patient is scheduled to come to the internal medicine clinic      tomorrow (February 15, 2006) at 10 a.m. to have a CBC, protime, and INR      drawn. At that time she will also be given a prescription for Lexapro 10      mg p.o. daily. Adjustments to her Coumadin will be made if necessary at      that time.  2.  [name] is scheduled to be seen in the Coumadin Clinic with Dr. Alexandria Lodge   on March 26th at 10:45 a.m.  3.  [name] is scheduled for a follow-up appointment in the outpatient      clinic with Dr. Shannan Harper and Yvonne Kendall, MS-4, to evaluate her      cath on February 20, 2006, at 3 p.m. At that time an appointment for EMG      and nerve conduction velocities will be scheduled with [facility] per Dr. Imagene Gurney recommendations. The patient was      advised to find an OB/GYN in order to assess her menorrhagia now that      she will likely remain on lifetime Coumadin.  4.  The patient was provided with a prescription  for outpatient physical      therapy and occupational therapy. She was also given information about      physical and occupational therapist, as well as home health, by the care      manager. She was instructed to contact her insurance in order to find a      physical and occupational therapist who is covered by her plan.   PROCEDURE PERFORMED:  1.  Doppler ultrasound of the left leg on February 09, 2006, showed a deep      venous thrombosis in the popliteal and posterior tibial veins without      evidence of superficial thrombosis or Baker's cyst.  2.  Doppler ultrasound of the right leg was performed on February 12, 2006,      because the patient began to complain of right knee pain. No evidence of      thrombosis was seen.   CONSULTATIONS:  1.  Dr. Maryclare Bean, interventional radiology.  2.  Dr. Josephina Gip, CVTS.  3.  Dr. Fabienne Bruns, CVTS.  4.  Dr. Avie Echevaria, neurology.   ADMISSION HISTORY AND PHYSICAL:  The patient is a 35 year old female with a  history of DVT and a diagnosis of Takayasu's arteritis who presents with a  two-day history of left calf and ankle swelling as well as pain. The pain is  localized to the lateral left calf and is worsened by any dorsal flexion of  the foot. Yesterday, she was unable to bear any weight on this foot due to  pain. She had some relief with Percocet yesterday evening. She has been  on  Coumadin since her last discharge from [facility] on December 26, 2005, and  was followed in the outpatient clinic by Dr. Alexandria Lodge. She was previously  admitted on December 21, 2005, for similar symptoms and was found to have  extensive DVT extending from the proximal left external iliac vein to the  tibial veins. At that time she was treated by catheter thrombolysis at the  clot. She also reports having had a left lower extremity DVT during her  pregnancy in 1995 which was treated with heparin. For the rest of her  history please see the chart.   PHYSICAL EXAMINATION:  VITAL SIGNS: Pertinent physical findings were vital  signs with temperature of 99.2, blood pressure 145/104 and later 125/75,  pulse 103, respirations 12. Oxygen saturation 97% on room air.  GENERAL: The patient was found lying comfortably in bed, in no acute  distress.  RESPIRATORY: Clear lung sounds bilaterally.  CARDIOVASCULAR: Regular rate and rhythm without murmurs, rubs, or gallops.  EXTREMITIES: Examination of the left leg shows 1+ posterior tibial and  dorsalis pedis pulses. Left calf tenderness along the lateral aspect of the  calf up to the knee was present. Evaluation of the swelling was not possible  because the patient had been wearing a compression stocking.  MUSCULOSKELETAL: Limited dorsal flexion of the left foot primarily because  of limited range of motion and not pain.  NEUROLOGIC: The patient was alert and oriented times three. Her cranial  nerves were grossly intact and she did not display any focal deficits.   ADMISSION LABORATORY:  White blood cell count 8.4, hemoglobin 11.2, platelet  count 199,000. Sodium 137, potassium 3.4, chloride 109, CO2 25, BUN 5,  creatinine 0.8, glucose 5, calcium 7.9, INR 2.1.   HOSPITAL COURSE:  Problem #1:  DVT. The patient was found to have a left  calf DVT despite being  adequately anticoagulated on Coumadin since her last discharge on December 26, 2005. After  consulting with Dr. Bonnielee Haff and Dr.  Hart Rochester, the patient's Coumadin was held and she was started on a heparin  drip. She also received 1 mg of vitamin K to lower her INR in preparation  for possible intervention the following day. The patient was seen on the  morning after her admission by Dr. Bonnielee Haff and Dr. Hart Rochester who felt that an  intervention with thrombolytics was not indicated at that time due to the  localization of the clot distal to the knee. In their opinion, the DVT that  prompted this admission was likely a residual clot from her previous DVT.  The patient was restarted on Coumadin and continued on heparin until the day  of discharge. Given the patient's repeat DVT and history of positive lupus  anticoagulant, the pharmacy recommended that her INR be maintained in a  range from 2.5 to 3.5.  The patient reached an INR of 2.5 on the morning of  February 13, 2006, and had an INR of 3.1 at the time of discharge. She will  return to the clinic tomorrow (February 15, 2006) to have her INR and CBC  checked and will see Dr. Alexandria Lodge in the Coumadin Clinic on Monday. The pain  and swelling in her left calf gradually improved during her hospitalization  to the point where she was able to ambulate well with crutches. She was  given a prescription for 30 Percocet in order to manage her pain symptoms.  At no point during the hospitalization did the patient exhibit any signs or  symptoms of pulmonary embolism, including chest pain and shortness of  breath. On February 12, 2006, the patient began to complain of right knee, with  patellar tenderness. She did not exhibit any signs of septic arthritis or  DVT. A Doppler ultrasound of the right leg was ordered and showed no clot.  This pain was most likely due to joint stiffness from being immobilized for  several days and had resolved at the time of discharge.   Problem #2:  Left foot drop. On admission the patient complained of a  decreased ability to dorsal  flex her left foot ever since the last admission  for deep venous thrombosis in January 2007. At that time she required  thrombectomy and thrombolytic infusion performed by interventional  radiology. Following this procedure a large hematoma formed in the posterior  compartment of her left calf. Please see the discharge summary from the  previous admission for further details. On initial examination the patient  had decreased active and passive dorsal flexion of the left foot, but did  not complain of any pain with these movements. Muscle strength in the left  foot was normal. Dr. Sandria Manly was asked to see the patient to evaluate if her  foot drop was the result of nerve damage. In his opinion, these findings  were most likely due to a calf hematoma rather than peroneal nerve damage.  Please see his dictated consultation note for further details. The patient  was seen by physical therapy who provided her with a boot to minimize her foot drop. She was also evaluated by occupational therapy and was given a  prescription to continue physical therapy and occupational therapy as an  outpatient. At her follow-up appointment next week, an EMG and nerve  conduction velocity studies will be scheduled with Dr. Imagene Gurney office.   Problem #3:  Takayasu's arteritis. Please see the  discharge summary dated  November 01, 2005, for the details of the patient's workup and diagnosis. Due  to weak pulses on the patient's left side secondary to this condition, her  pulses were monitored in the left foot by palpitation and Doppler in order  to ensure adequate perfusion of her foot. The patient was kept on her home  regimen of prednisone and methotrexate as prescribed by her rheumatologist.  She will continue her regular follow-up with Dr. Phylliss Bob. We will defer Dr.  Renaldo Reel recommendations of factor-V Leiden and prothrombin-2 studies, as  these tests may have already been completed in her prior extensive workup by  Dr.  Phylliss Bob.   Problem #4:  Anemia. The patient has a history of iron-deficiency anemia and  required a transfusion for a hemoglobin of 7.7 on her last admission in  January. On this admission, Ms. Josephine Cables hemoglobin was found to be 13.7. On  the subsequent day it had fallen to 11.2. It continued to trend downward  reaching a minimum of 9.5 on the morning of February 13, 2006. At no time did  the patient complain of chest pain, shortness of breath, or lightheadedness.  Of note, [name] began her menstrual period on the day prior to admission  and she stated that her bleeding was considerably heavier than before she  began Coumadin in January. She reported using 8-10 tampons per day for the  first four days of her admission. Haptoglobin and LDH studies were obtained  and were found to be normal at 142 and 138 respectively, which decreases the  likelihood of hemolysis.  Fecal occult blood tests were also negative on  March 21st. The patient will continue on iron sulfate and folic acid after  discharge. At the time of discharge, her menstrual bleeding had decreased  significantly and her hemoglobin was 10.0. She will return to the clinic on  March 23rd for a CBC to ensure that her hemoglobin has not fallen. The  patient was advised to see an OB/GYN to evaluate and possibly reduce her  menorrhagia.   Problem #5:  Depression. On the day following admission, the patient  expressed a depressed mood and recent ups and downs surrounding her  marriage and her husband's recent deployment to [place]. She was started on  Lexapro 10 mg daily, which she will continue as an outpatient. She will be  given a prescription for this when she comes to the clinic tomorrow (February 15, 2006) for her blood test.   DISCHARGE LABORATORIES:  White blood cell count 7.3, hemoglobin 10.0,  hematocrit 28.9, MCV 91.4, platelet count 267,000.  Protime 32.1, INR 3.1.  Heparin level 0.51. Sodium 141, potassium 3.6, chloride 112, CO2  27, glucose 76, BUN 3, creatinine 0.7, calcium 8.3. LDH 138. Haptoglobin 142. Creatine  kinase 47.  Fecal occult blood test negative.      Judie Grieve, MD    ______________________________  Ileana Roup, M.D.    SY/MEDQ  D:  02/14/2006  T:  02/16/2006  Job:  [id number]   cc:   Eliseo Gum, M.D.  Fax: [phone]   Genene Churn. Love, M.D.  Fax: [phone]   Areatha Keas, M.D.  Fax: [phone]

## 2011-04-13 NOTE — Consult Note (Signed)
[name], [name] NO.:  [id number]   MEDICAL RECORD NO.:  [id number]          PATIENT TYPE:  INP   LOCATION:  3002                         FACILITY:  [facility]   PHYSICIAN:  Kerin Perna, M.D.  DATE OF BIRTH:  1976/09/21   DATE OF CONSULTATION:  DATE OF DISCHARGE:                                   CONSULTATION   PHYSICIAN REQUESTING CONSULTATION:  Lowella Bandy, M.D.   CONSULTANT:  Kerin Perna, M.D.   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   CHIEF COMPLAINT:  Chest and back pain.   HISTORY OF PRESENT ILLNESS:  I was asked to evaluate this 35 year old  African-American female for evaluation and therapy of a recently diagnosed  invasive mediastinal mass.  The patient has been experiencing upper chest  and back discomfort with some shortness of breath for the past four weeks.  She had been treated for upper respiratory infection, chest wall pain with  Flexeril and Motrin, and returned with further symptoms.  A CT scan and  chest x-ray demonstrated a superior mediastinal mass measuring 4 x 3 cm in  the left peritracheal region extending tot he AP window.  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  The patient has experienced a dissociated weight loss and  is having night sweats but denies fever.  She states her left hand is  usually cold.   PAST MEDICAL HISTORY:  1.  History of DVT and pulmonary embolus during pregnancy 11 years ago      treated with heparin and postpartum Coumadin, now inactive.  2.  No previous operations.  3.  No known drug allergies.   HOME MEDICATIONS:  Motrin, Flexeril, ibuprofen.   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  She is  married and her husband is in the marines in training in [place].  She has  an 63 year old son from a first marriage.  He is in good health.  She has  never smoked and  she does not use alcohol.   FAMILY HISTORY:  The mother had a heart bypass surgery and the stroke.  Her  father had an abdominal aneurysm.  No history of lymphoma or mediastinal  tumors in the family.   REVIEW OF SYSTEMS:  Constitutional review is positive for night sweats and  weight loss.  ENT review is negative for difficulty swallowing, change in  vision, or dental complaints.  Thoracic review is positive for her upper  posterior back pain and some intermittent palpitations and shortness of  breath.  Cardiac review is negative for a history of murmur or heart  disease.  Urologic review is negative for kidney stones or UTI.  GI review  is negative for change of bowel habits, blood per rectum, or jaundice.  Hematologic review is negative for bleeding disorder or previous adenopathy.  Neurologic review is negative for a stroke, positive for a headache.  Musculoskeletal review is negative for fractures but positive for  back pain.   PHYSICAL EXAMINATION:  GENERAL:  The patient is a pleasant young African-  American female in no acute distress.  VITAL SIGNS:  Her temperature is 99.9, blood pressure 130/70 in the right  arm, pulse 90 and regular, saturation 99% on room air.  HEENT:  Normocephalic.  Pupils equal and reactive.  Pharynx is clear.  Dentition is good.  NECK:  Without JVD, mass, or carotid bruit.  Left carotid pulse is  diminished.  LYMPH:  Lymphatics reveal no palpable supraclavicular or axillary  adenopathy.  THORACIC:  Review reveals no deformity.  She has some tenderness under the  left scapula.  Breath sounds are clear and equal bilaterally.  CARDIAC:  Exam reveals regular rhythm without S3 gallop or murmur.  ABDOMEN:  Abdominal exam is negative for mass, organomegaly, or tenderness.  EXTREMITIES:  Reveal no clubbing, cyanosis, or edema.  Peripheral pulses are  normal except for the left upper extremity which has diminished pulses.  SKIN:  Without rash or lesion.   NEUROLOGIC:  Exam is alert and oriented without focal motor deficit.   LABORATORY DATA:  She is anemic with a hematocrit of 30% and a white count  is 12.7 thousand.  Her sodium is 133, her potassium 3.8.  LFTs are normal  and her albumin is diminished at 2.4.  Coags are normal.   I reviewed the CT scan and she has an infiltrating mediastinal tumor along  the left paratracheal region extending to the AP window.  There is no  evidence of pulmonary emboli.  There is no evidence of parenchymal pulmonary  mass.   IMPRESSION AND PLAN:  The patient has an infiltrating invasive mediastinal  tumor which, by CT scan, does not look resectable.  Biopsy via a  mediastinoscopy or mediastinotomy would be indicated to establish a  diagnosis to direct therapy.  I have discussed the procedure with the  patient and her family and will schedule the operation for November 27.  Thank you for the consultation.      Kerin Perna, M.D.  Electronically Signed     PV/MEDQ  D:  10/20/2005  T:  10/20/2005  Job:  [id number]

## 2011-04-13 NOTE — Op Note (Signed)
[name], DYSART                  ACCOUNT NO.:  [id number]   MEDICAL RECORD NO.:  [id number]          PATIENT TYPE:  INP   LOCATION:  3002                         FACILITY:  [facility]   PHYSICIAN:  Salvatore Decent. Dorris Fetch, M.D.DATE OF BIRTH:  [birth year]   DATE OF PROCEDURE:  10/22/2005  DATE OF DISCHARGE:                                 OPERATIVE REPORT   PREOPERATIVE DIAGNOSIS:  Mediastinal mass versus vasculitis.   POSTOPERATIVE DIAGNOSIS:  Vasculitis.   PROCEDURE:  Bronchoscopy and mediastinoscopy.   SURGEON:  Salvatore Decent. Dorris Fetch, M.D.   ASSISTANT:  None.   ANESTHESIA:  General.   FINDINGS:  Fibrotic reaction in mediastinum.  Small lymph nodes seen did not  appear pathologic.  Palpable mass-like thickening and hardening of the wall  of the innominate artery, frozen section consistent with vasculitis.   CLINICAL NOTE:  [name] is a 35 year old African American female who  presented with a complaint of upper chest and back discomfort and shortness  of breath for 4 weeks.  She had been treated symptomatically and  subsequently a CT scan was done which showed a mass-like area in the  mediastinum.  On close review, this appeared to track the descending,  transverse and arch and descending thoracic aorta.  There was occlusion of  her subclavian on the left as well as the left carotid and there was more of  a mass-like effect of the process in that area.  The patient was advised to  undergo bronchoscopy and mediastinoscopy for diagnosis, with the  differential being vasculitis versus lymphoma versus an invasive germ cell  tumor.  The indications, risks, benefits and alternatives have been  discussed with the patient by Dr. Donata Clay and I once again discussed these  issues with her the morning of surgery.  She understood and accepted the  risks and agrees to proceed.   OPERATIVE NOTE:  [name] was brought to the preop holding area on October 22, 2005.  There, the Anesthesia  Service placed an arterial blood pressure  monitoring catheter as well as lines for intravenous access.  Intravenous  antibiotics were administered.  She was taken to the operating room,  anesthetized and intubated.  A flexible fiberoptic bronchoscopy was  performed via the endotracheal tube; it revealed normal endobronchial  anatomy.  There was some nonspecific thickening of the membranous trachea  and extending into the membranous portion of the right and left main stem  bronchi.  There was no mass lesion seen.   A transverse incision was made 1 fingerbreadths above the sternal notch and  carried through the skin and subcutaneous tissue.  The strap muscles were  separated in the midline and the pretracheal fascia was identified and  incised.  The pretracheal plane was developed bluntly into the mediastinum.  There was a fibrotic reaction in the area and made the blunt dissection  difficult.  The mediastinoscope was inserted and an attempt was made to  systematically inspect the lymph node station.  This also was very difficult  due to intense fibrotic reaction in the area.  The  arterial blood pressure  monitoring catheter was in the right arm and care was taken not to occlude  the innominate artery with the mediastinoscope.  Two small lymph nodes were  identified and biopsied; these were normal to slightly inflammatory in  appearance, but did not appear tumorous.  On withdrawing the  mediastinoscope, the finger was reinserted and palpation revealed a mass-  like thickening of the proximal portion of the innominate artery.  The more  distal portion of the artery had a normal appearance and fill to it.  An  [employer] retractor was placed on the manubrium and the innominate artery  could be seen within the field with direct vision.  On direct inspection, it  was obvious that proximally there was a mass-like thickening of the arterial  wall.  Tissue from this area was taken and sent for  frozen section, and was  consistent with avascular tissue with inflammatory changes consistent with a  vasculitis.  There was no evidence of tumor seen.  This was consistent with  the clinical presentation as well as with the CT scan findings.   Additional tissue was shaved from the innominate artery, taking care not to  compromise the wall of the vessel; this was sent for permanent sections  only.  After inspection for hemostasis, the wound was closed in 2 layers  with a subcuticular closure for the skin.  All sponge, needle and instrument  counts were correct at the end of the procedure and the patient was  transported from the operating room to the postanesthetic care unit,  extubated and in stable condition.           ______________________________  Salvatore Decent Dorris Fetch, M.D.     SCH/MEDQ  D:  10/23/2005  T:  10/24/2005  Job:  [id number]   cc:   C. Ulyess Mort, M.D.  Fax: [phone]

## 2011-04-13 NOTE — Consult Note (Signed)
[name], [name]                  ACCOUNT NO.:  [id number]   MEDICAL RECORD NO.:  [id number]          PATIENT TYPE:  INP   LOCATION:  2031                         FACILITY:  [facility]   PHYSICIAN:  Genene Churn. Love, M.D.    DATE OF BIRTH:  February 20, 1976   DATE OF CONSULTATION:  02/11/2006  DATE OF DISCHARGE:                                   CONSULTATION   This 35 year old right handed black female is seen for evaluation of left  foot drop.   HISTORY OF PRESENT ILLNESS:  [name] has a past history of phlebitis and a  miscarriage during pregnancy.  Her sisters also had a history of  miscarriage.  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  She had elevated  sed rate, elevated CRP, and was seen by rheumatology.  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  She was readmitted December 21, 2005, for deep venous  thrombosis involving the left leg.  During her hospitalization, she was  evaluated with anti-phosopholipid antibodies which were positive but had a  negative anti-cardiolipid antibody test.  She underwent a trans-catheter  thrombolysis for deep venous thrombosis and following the procedure, had a  left calf hematoma and difficulty in dorsiflexing her left foot because of  dicomfort and tightness in her left calf.  She was discharged on Coumadin  therapy and was using a walker and crutches, walking on her left toe mostly  until the last three weeks, when she began walking on her left foot and was  no longer using the walker or the crutches.  Several days prior to  admission, she developed left foot and leg pain and was admitted with  evidence of phlebitis in the popliteal vessels.  She has had pain on  dorsiflexing her left foot with tightness of her left calf.  She has not had  any numbness.  She denies any lower back pain radiating to her legs.  She  denies any posterior knee pain.   PHYSICAL EXAMINATION:  GENERAL:  Well developed female.  VITAL SIGNS:  Blood pressure right arm 130/80, left arm 0, she had a good  common carotid pulse on the right and absent common carotid pulse on the  left, heart rate 96.  NEUROLOGICAL:  Mental status exam reveals she is alert ands oriented x 3,  follows three step commands.  Cranial nerve examination reveals visual  fields full, discs flat, extraocular movements are full, corneal present, no  seventh nerve palsy, tongue midline, uvula midline, gag was present,  sternocleidomastoid and trapezius testing normal.  Motor examination  revealed 5/5 strength proximally and distally in the upper extremities.  She  had an out-stretched hand arm tremor.  In the lower extremities, she was  4+/5 in the left leg in dorsiflexion but very good EHL,  very good perinea  and very good posterior tibialis and gastrocsoleus.  She had reflexes at the  left knee and at the left ankle and no evidence of any web space, decreased  pin prick.  She had significant tightness of her left leg and the calf.   IMPRESSION:  1.  Left foot drop, most likely representing left calf hematoma rather than      perineal nerve palsy, 255.3.  2.  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  3.  History of phlebitis, 345.19, with past history of spontaneous abortion      and phlebitis in pregnancy, positive family history of spontaneous      abortion.   PLAN:  EMG nerve conductions as an outpatient, factor V liden, and  prothrombin II studies for further evaluation.           ______________________________  Genene Churn. Sandria Manly, M.D.     JML/MEDQ  D:  02/11/2006  T:  02/12/2006  Job:  [id number]

## 2011-04-13 NOTE — Discharge Summary (Signed)
[name], [name]                  ACCOUNT NO.:  [id number]   MEDICAL RECORD NO.:  [id number]          PATIENT TYPE:  INP   LOCATION:  6734                         FACILITY:  [facility]   PHYSICIAN:  Duncan Dull, M.D.     DATE OF BIRTH:  [birth year]   DATE OF ADMISSION:  12/21/2005  DATE OF DISCHARGE:  12/26/2005                                 DISCHARGE SUMMARY   DISCHARGE DIAGNOSES:  1.  Extensive deep venous thrombosis of the left lower extremity extending      from the tibial veins to the proximal external iliac vein., status post      thrombolysis and continued anticoagulation.  2.  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  3.  iron deficiency anemia.  4.  Leukocytosis.  5.  Restlessness/insomnia.  6.  Hypokalemia.  7.  Constipation.  8.  Large ovarian cyst.   DISCHARGE MEDICATIONS:  1.  Calcium 500 mg daily.  2.  Folic acid 1 mg daily.  3.  Methotrexate 12.5 mg every Thursday.  4.  Prednisone 20 mg daily.  5.  Coumadin 5 mg daily.  6.  Colace 100 mg b.i.d.  7.  Ambien CR 12.5 mg at bedtime as needed.  8.  Lovenox 70 mg subcu every 12 hours.  9.  Darvocet one to two tablets p.o. every four hours for moderate pain.  10. Percocet one tablet p.o. every four hours  p.r.n. severe pain, not to be      taken with Darvocet.  11. Iron sulfate 325 mg p.o. t.i.d.   CONDITION AT DISCHARGE:  The patient is much improved at the time of her  discharge following thrombolytics.  The patient had resolution of her left  leg DVT and swelling.  She did have some continued pain and difficulty with  ambulation but was able to ambulate with a walker at the time of discharge.  She will follow up with Dr. Alexandria Lodge on February1 at 3 p.m. a check of her INR  and for management of her Coumadin.  She will follow up with outpatient  clinic on Monday, February5 at 11:30 a.m. for followup on her anemia and her  hematoma as well as leg swelling.  She will follow up  with Dr. Phylliss Bob, her  rheumatologist in one to two weeks.   PROCEDURES:  1.  Doppler ultrasound of her left lower extremity showed extensive DVT      extending from the tibial vein proximally towards the proximal external      iliac vein.  2.  A CT angiogram of her left lower extremity done on January27,2007 shows      normal patency of the abdominal aorta, iliac arteries, and lower      extremity arterial supply.  It also showed extensive DVT of the left      lower extremity with an occlusive popliteal and superficial femoral vein      clot.  Nonocclusive clot in the common femoral vein and external iliac      vein.  Superior aspect of the  clot extends to the proximal external      iliac vein.  There are incidental bilateral adnexal cysts noted.  3.  The patient underwent ultrasound guidance placement of a transcatheter      in her popliteal vein by interventional radiology on January28,2007.  4.  A venogram of the left lower extremity done on January28, 2007 shows      transcatheter venous thrombolysis begun for left lower extremity DVT      with initiation of thrombolytics.  5.  A venogram of the left lower extremity done on January29,2007 shows      clearance of the acute component of the left lower extremity DVT.      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   CONSULTATIONS:  1.  Dr. Darrick Penna of [facility].  2.  Dr. Fredia Sorrow of interventional radiology.   BRIEF ADMITTING HISTORY AND PHYSICAL:  [name] is a 35 year old African-  American woman with a history of a DVT in 1995 in her left leg secondary to  pregnancy and treated with heparin during her pregnancy until the birth of  her child when she was switched to Coumadin and maintained on that for less  than one year.  She also has a history of positive lupus anticoagulant and a  history of Takayasu's arteritis. She presents to the ED with a one- to two-  day history of left lower extremity  pain and swelling predominantly in her  calf.  Please see full admission H&P for full details.   Physical exam on admission showed a temperature of 98.5, blood pressure  146/84, pulse of 99, respirations 20.  She was 100% on room air.  Notable  findings on the physical exam were in regards to her left lower extremity.  Her left leg was edematous with tenderness to palpation over the calf.  She  had no palpable cords.  Her Denna Haggard' sign was negative.  She had no pain on  dorsiflexion.  The rest of her exam was unremarkable.   Notable laboratory findings on admission included a white blood cell count  of 15.3 and a hemoglobin of 10.8.  Left leg Dopplers at the time of  admission did show evidence for acute extensive DVT extending from her calf  veins through the popliteal and femoral veins.   HOSPITAL COURSE BY PROBLEM:  1.  Extensive DVT of the left lower extremity.  [name] presented with pain      and swelling in her left lower extremity that was found to be secondary      to a very extensive DVT extending from her tibial veins to her proximal      external iliac vein.  She was initially started on full dose heparin for      anticoagulation. [facility] was consulted and Dr. Darrick Penna evaluated the patient      for possible thrombolytics. Initially there was some concern given her      absent pulses in her left lower extremity that there was some component      of her Takayasu's arteritis contributing to the pain in her extremity      with claudication; however, a CT angiogram of the left lower extremity      and pelvis showed no significant arterial occlusive disease.  Therefore,      it was felt that all her symptoms were attributable to her extensive      DVT.  Dr.  Fields recommended thrombolytics and consulted Dr. Fredia Sorrow of      interventional radiology.  A popliteal transcatheter was placed and      thrombolytics were initiated.  Venogram did show resolution of the acute     component of  her clot.  At that time, thrombolytics were discontinued.      She was resumed on full dose Lovenox and started on Coumadin.  Post      thrombolytic therapy she did develop a hematoma in her left calf at the      site of the transcatheter placement.  She was followed by serial calf      and thigh measurements, and the swelling in her left calf did continue      to decrease.  She did have some difficulty ambulating secondary to the      residual pain in her left lower extremity.  PT was consulted and      recommended a rolling walker which was delivered to the room prior to      her discharge.  The patient was able to ambulate with the walker.  She      was also given a temporary handicap parking sticker.  Because this is      her second DVT, It is felt that she will need lifelong anticoagulation      given the nature of this DVT in a patient with positive lupus      anticoagulant.  She is being discharged on Lovenox bridge therapy while      her Coumadin becomes therapeutic.  She is quite familiar with all of      this, having had a DVT during her pregnancy approximately ten years ago.      She will follow up with Dr. Alexandria Lodge tomorrow to follow her PT and INR.  2.  Takayasu's arteritis. This was diagnosed at a previous hospitalization      in November2006.  She is being followed by Dr. Phylliss Bob of rheumatology.      During this hospitalization she was maintained on her prednisone,      methotrexate and folic acid.  Dr. Phylliss Bob was made aware of the patient's      hospitalization and would like the patient has follow up in one to two      weeks for continued tapering of her prednisone.  #3.  Anemia.  The patient's hemoglobin of 10.8 at admission suggested  response to iron supplementation, given that her hemoglobin in November 2006  was 9.8.  She did develop some bleeding at the site of her catheter during  thrombolysis and developed a large hematoma in the area of her calf.  She  also began her  menstrual cycle while in house and eventually dropped her  hemoglobin to 7.6.  She was tachycardic during this time and did endorse  fatigue with minimal exertion.  For that reason she was transfused two units  of packed red blood cells.  A repeat hemoglobin was 11.9.  The rest of her  anemia workup was negative with the exception of iron deficiency.  She was  guaiac negative; therefore, her iron deficiency anemia was felt to be most  likely secondary to menstruation with a baseline anemia in the background.  She was discharged on iron supplementation, and her CBC should be checked at  the time of her hospital followup.  1.  Leukocytosis.  The patient was admitted with a leukocytosis.  Her white  blood cell count was 15.3 on admission.  She had no clinical signs of      infection, and she was afebrile during the course of her      hospitalization.  Her leukocytosis was felt to be due to her acute DVT.      Her white blood cell count at the time of discharge was 11.7.  Again,     this should be followed up on as an outpatient at the time of her      hospital followup.  2.  Restlessness/insomnia.  The patient came in with a history of      restlessness and insomnia.  Previously she was taking Lunesta which was      not helping.  She was started on Ambien in house and seemed to do well      with this medication.  She was discharged home with a prescription for      Ambien CR.  6..  Hypokalemia.  Initially the patient's potassium was normal; however, it  dropped to 3.0 in the middle of the hospitalization.  Her potassium was  repleted, and at the time of discharge her potassium had normalized to 3.9.  This may be checked at the time of her hospital followup.  There was no  clear etiology to her hypokalemia.  She did have a history of being on Lasix  prior to admission; however, this medication was held while she was in house  and she was not discharged on this medication.  1.  Chronic  constipation.  The patient did say that she has constipation      from time to time and uses a suppository.  She required a suppository in      the hospitalization.  She was discharged on Colace as I did start her on      iron supplementation and this will complicate her constipation.  She may      continue to use suppositories as needed.  2.  Large ovarian cyst noted incidentally on CT angiogram of the pelvis.      There was consideration the cyst on the left may be compressing her      iliac vein contributing to her DVT.  However, she will be on lifelong      anticoagulation so the compression aspect is not as worrisome.  She will      need serial ultrasound followup after discharge.   There are no pending labs at the time of this dictation.   DISCHARGE LABS AND VITAL SIGNS:  Her vital signs on the day of discharge  showed a temperature of 98.4, pulse of 91, respirations 20, blood pressure  124/76.  Her oxygen saturations were 98% on room air.  Her last set of labs  included a white blood cell count of 11.7.  Her post transfusion hemoglobin  was 11.9 and hematocrit was 35.0. Her last BMP showed a potassium of 3.9,  otherwise normal.  Other significant labs during this hospitalization  included an erythropoietin level of 142, an RBC folate of 503, a vitamin B12  level of 461.  Of note, all of these were within normal limits, and the  erythropoietin level is actually elevated.  Her ferritin was 23.  Reticulocyte count was  86.4.  Her immature reticulocyte fraction was 37.2 which was elevated.  Her  fecal Hemoccult test was negative.  Iron was 11, percent saturation was 4,  and total iron binding capacity was 312.  Her PT-INR  on the day of discharge  was 14.6 and 1.1 respectively. Again, there are no pending labs at the time  of this dictation.      Inis Sizer, M.D.      Duncan Dull, M.D.  Electronically Signed    DC/MEDQ  D:  12/29/2005  T:  12/29/2005  Job:  [id number]    cc:   Janetta Hora. Fields, MD  [address], [place] [address]  Jodi Marble. Fredia Sorrow, M.D.  Fax: [phone]

## 2011-04-13 NOTE — Op Note (Signed)
NAMEMARLY, SCHULD                  ACCOUNT NO.:  [id number]   MEDICAL RECORD NO.:  [id number]          PATIENT TYPE:  AMB   LOCATION:  SDS                          FACILITY:  [facility]   PHYSICIAN:  Charles E. Fields, MD  DATE OF BIRTH:  Oct 01, 1976   DATE OF PROCEDURE:  11/05/2006  DATE OF DISCHARGE:                               OPERATIVE REPORT   PROCEDURE:  1. Inferior vena cavogram.  2. Placement of the inferior vena cava filter.   PREOPERATIVE DIAGNOSIS:  Recurrent deep venous thrombosis.Marland Kitchen   POSTOPERATIVE DIAGNOSIS:  Recurrent deep venous thrombosis..   ANESTHESIA:  Local.   INDICATIONS:  The patient is an 35 year old female with history of  multiple DVTs.  She has had a recent episode of superficial  thrombophlebitis and DVT despite therapeutic Coumadin and Lovenox.  She  presents for placement of elective filter.  Filter was placed for  prevention of pulmonary embolus.   OPERATIVE FINDINGS:  1. Trapeze filter, inferior vena cava.   OPERATIVE DETAIL:  After obtaining informed consent, the patient taken  to the PV lab.  The patient placed supine position on the angio table.  Next, local anesthesia was infiltrated in the right femoral region.  A  Majestic needle was used to cannulate the right common femoral vein.  A  0.035 J-tip guidewire was threaded into the right common iliac system  under fluoroscopic guidance.  A 6-French long sheath for the filter was  placed over the guidewire.  However, this would not advance easily  through the right common iliac system and up into the cava.  Therefore a  right iliac contrast injection was performed.  This shows wide patency  of the right common iliac artery as well as the inferior vena cava.  There is a small cul-de-sac that seemed to be where the wire was headed  initially.  Therefore a short right-angled catheter was placed over the  guidewire up towards the iliac system.  A 0.035 Wholey wire was then  easily advanced into the  inferior vena cava under fluoroscopic guidance.  Inferior vena cavogram was then obtained.  This showed the level of the  left renal vein which is patent.  There is no thrombus within the  inferior vena cava.  A trapeze filter was then brought up on the  operative field after the sheath had been advanced into position around  the L4 vertebral body.  The IVC filter was then deployed the usual  fashion.  Sheath and guidewire were removed.  Hemostasis was obtained  with direct pressure.  The patient tolerate procedure well and  complications.  The patient was taken to the holding area in stable  condition.      Janetta Hora. Fields, MD  Electronically Signed    CEF/MEDQ  D:  11/05/2006  T:  11/05/2006  Job:  [id number]

## 2012-11-03 ENCOUNTER — Encounter (HOSPITAL_COMMUNITY): Payer: Self-pay | Admitting: Emergency Medicine

## 2012-11-03 ENCOUNTER — Emergency Department (HOSPITAL_COMMUNITY): Payer: Medicare Other

## 2012-11-03 ENCOUNTER — Emergency Department (HOSPITAL_COMMUNITY)
Admission: EM | Admit: 2012-11-03 | Discharge: 2012-11-03 | Disposition: A | Discharge status: Home / Self Care | Payer: Medicare Other | Attending: Emergency Medicine | Admitting: Emergency Medicine

## 2012-11-03 DIAGNOSIS — D509 Iron deficiency anemia, unspecified: Secondary | ICD-10-CM | POA: Insufficient documentation

## 2012-11-03 DIAGNOSIS — M25559 Pain in unspecified hip: Secondary | ICD-10-CM | POA: Insufficient documentation

## 2012-11-03 DIAGNOSIS — M549 Dorsalgia, unspecified: Secondary | ICD-10-CM | POA: Insufficient documentation

## 2012-11-03 DIAGNOSIS — Z86718 Personal history of other venous thrombosis and embolism: Secondary | ICD-10-CM | POA: Insufficient documentation

## 2012-11-03 DIAGNOSIS — Z79899 Other long term (current) drug therapy: Secondary | ICD-10-CM | POA: Insufficient documentation

## 2012-11-03 DIAGNOSIS — E876 Hypokalemia: Secondary | ICD-10-CM | POA: Insufficient documentation

## 2012-11-03 DIAGNOSIS — M314 Aortic arch syndrome [Takayasu]: Secondary | ICD-10-CM | POA: Insufficient documentation

## 2012-11-03 DIAGNOSIS — R0602 Shortness of breath: Secondary | ICD-10-CM | POA: Insufficient documentation

## 2012-11-03 DIAGNOSIS — R079 Chest pain, unspecified: Secondary | ICD-10-CM

## 2012-11-03 DIAGNOSIS — Z85828 Personal history of other malignant neoplasm of skin: Secondary | ICD-10-CM | POA: Insufficient documentation

## 2012-11-03 DIAGNOSIS — R599 Enlarged lymph nodes, unspecified: Secondary | ICD-10-CM | POA: Insufficient documentation

## 2012-11-03 DIAGNOSIS — Z3202 Encounter for pregnancy test, result negative: Secondary | ICD-10-CM | POA: Insufficient documentation

## 2012-11-03 DIAGNOSIS — I739 Peripheral vascular disease, unspecified: Secondary | ICD-10-CM | POA: Insufficient documentation

## 2012-11-03 DIAGNOSIS — M542 Cervicalgia: Secondary | ICD-10-CM | POA: Insufficient documentation

## 2012-11-03 HISTORY — DX: Peripheral vascular disease, unspecified: I73.9

## 2012-11-03 HISTORY — DX: Enlarged lymph nodes, unspecified: R59.9

## 2012-11-03 HISTORY — DX: Acute embolism and thrombosis of unspecified deep veins of unspecified lower extremity: I82.409

## 2012-11-03 HISTORY — DX: Benign neoplasm of other specified sites: D36.7

## 2012-11-03 LAB — CBC WITH DIFFERENTIAL/PLATELET
Basophils Absolute: 0 10*3/uL (ref 0.0–0.1)
Eosinophils Absolute: 0.1 10*3/uL (ref 0.0–0.7)
Eosinophils Relative: 1 % (ref 0–5)
Lymphocytes Relative: 15 % (ref 12–46)
MCH: 25.4 pg — ABNORMAL LOW (ref 26.0–34.0)
MCV: 78.6 fL (ref 78.0–100.0)
Platelets: 537 10*3/uL — ABNORMAL HIGH (ref 150–400)
RDW: 18.3 % — ABNORMAL HIGH (ref 11.5–15.5)
WBC: 11.1 10*3/uL — ABNORMAL HIGH (ref 4.0–10.5)

## 2012-11-03 LAB — COMPREHENSIVE METABOLIC PANEL
ALT: 5 U/L (ref 0–35)
AST: 10 U/L (ref 0–37)
Albumin: 2.9 g/dL — ABNORMAL LOW (ref 3.5–5.2)
Calcium: 9.1 mg/dL (ref 8.4–10.5)
Sodium: 138 mEq/L (ref 135–145)
Total Protein: 7.9 g/dL (ref 6.0–8.3)

## 2012-11-03 LAB — URINALYSIS, ROUTINE W REFLEX MICROSCOPIC
Glucose, UA: NEGATIVE mg/dL
pH: 7 (ref 5.0–8.0)

## 2012-11-03 LAB — URINE MICROSCOPIC-ADD ON

## 2012-11-03 LAB — D-DIMER, QUANTITATIVE: D-Dimer, Quant: 1.84 ug/mL-FEU — ABNORMAL HIGH (ref 0.00–0.48)

## 2012-11-03 MED ORDER — POTASSIUM CHLORIDE 20 MEQ/15ML (10%) PO LIQD
40.0000 meq | Freq: Once | ORAL | Status: AC
  Administered 2012-11-03: 40 meq via ORAL
  Filled 2012-11-03: qty 30

## 2012-11-03 MED ORDER — POTASSIUM CHLORIDE CRYS ER 20 MEQ PO TBCR: 40.0000 meq | EXTENDED_RELEASE_TABLET | Freq: Once | ORAL | Status: DC

## 2012-11-03 MED ORDER — IOHEXOL 350 MG/ML SOLN
80.0000 mL | Freq: Once | INTRAVENOUS | Status: AC | PRN
  Administered 2012-11-03: 80 mL via INTRAVENOUS

## 2012-11-03 MED ORDER — HYDROCODONE-ACETAMINOPHEN 5-500 MG PO TABS: 1.0000 | ORAL_TABLET | Freq: Four times a day (QID) | ORAL | Status: DC | PRN

## 2012-11-03 MED ORDER — OXYCODONE-ACETAMINOPHEN 5-325 MG PO TABS: 2.0000 | ORAL_TABLET | Freq: Once | ORAL | Status: DC

## 2012-11-03 MED ORDER — KETOROLAC TROMETHAMINE 60 MG/2ML IM SOLN: 60.0000 mg | Freq: Once | INTRAMUSCULAR | Status: DC

## 2012-11-03 MED ORDER — KETOROLAC TROMETHAMINE 30 MG/ML IJ SOLN
INTRAMUSCULAR | Status: AC
  Administered 2012-11-03: 30 mg via INTRAMUSCULAR
  Filled 2012-11-03: qty 2

## 2012-11-03 MED ORDER — PREDNISONE 20 MG PO TABS: 40.0000 mg | ORAL_TABLET | Freq: Every day | ORAL | Status: DC

## 2012-11-03 NOTE — ED Notes (Signed)
PT. REPORTS INTERMITTENT RIGHT CHEST PAIN FOR 2 WEEKS WORSE THIS MORNING , WITH SOB , DENIES COUGH OR CONGESTION ,NO NAUSEA /SLIGHT DIAPHORESIS . PT. TOOK 3 ADVIL WITH SLIGHT RELIEF. RATES PAIN 8/10.

## 2012-11-03 NOTE — ED Notes (Signed)
Pt states she has been feeling R arm, chest, back and neck pain x 2 weeks.  The pain is reproducible and pt cannot sleep on back, but has to sleep on pillows on stomach. States hx of lymphadenopathy with benign biopsy in 2006.  Lymphadenopathy was tx with methotrexate and steroids.

## 2012-11-03 NOTE — ED Notes (Addendum)
PA-C in room at this time. Pt SpO2 will drop to 94% when talking. At rest SpO2 100%

## 2012-11-03 NOTE — ED Provider Notes (Signed)
History     CSN: [id number]  Arrival date & time 11/03/12  0620   First MD Initiated Contact with Patient 11/03/12 [phone]      Chief Complaint  Patient presents with  . Chest Pain    (Consider location/radiation/quality/duration/timing/severity/associated sxs/prior treatment) The history is provided by the patient and medical records.    [name] is a 36 y.o. female  with a hx of benign lymphadenopathy presents to the Emergency Department complaining of gradual, persistent, progressively worsening chest pain onset 2 weeks ago. Associated symptoms include shortness of breath, back pain, right arm pain, and right axilla pain.  Ibuprofen and prednisone (when she can get it) makes it better and nothing makes it worse.  Pt denies fever, chills, headache, abdominal pain, nausea, vomiting, diarrhea, weakness, dizziness, syncope, dysuria, hematuria, blood in stools.  Patient states she has seen a rheumatologist in the past for this problem. She states that for time she took methotrexate and high-dose prednisone which the problematic day she has since stopped taking these medications because she was concerned about the impact on her body. She states she tried to see her primary care doctor last Sunday but they were unable to verify her insurance and therefore were unable to give her prednisone prescription.  Pt reports that this pain and associated symptoms are consistent with symptoms during past "flares" of her swollen lymph nodes.   On record review, Pt with Hx of DVT, IVC filter and non-compliant with her anticoagulation.     Past Medical History  Diagnosis Date  . PVD (peripheral vascular disease)   . Benign tumor of back   . Swelling, lymph nodes 2006    intermittant, benign  . DVT (deep venous thrombosis)     Past Surgical History  Procedure Date  . Ivc filter placement   . Breast surgery     bil breast implants    No family history on file.  History  Substance Use Topics   . Smoking status: Never Smoker   . Smokeless tobacco: Not on file  . Alcohol Use: Yes    OB History    Grav Para Term Preterm Abortions TAB SAB Ect Mult Living                  Review of Systems  Constitutional: Negative for fever, diaphoresis, appetite change, fatigue and unexpected weight change.  HENT: Positive for neck pain. Negative for mouth sores and neck stiffness.   Eyes: Negative for visual disturbance.  Respiratory: Positive for shortness of breath. Negative for cough, chest tightness and wheezing.   Cardiovascular: Positive for chest pain.  Gastrointestinal: Negative for nausea, vomiting, abdominal pain, diarrhea and constipation.  Genitourinary: Negative for dysuria, urgency, frequency and hematuria.  Musculoskeletal: Positive for back pain and arthralgias.  Skin: Negative for rash.  Neurological: Negative for syncope, light-headedness and headaches.  Hematological: Positive for adenopathy.  Psychiatric/Behavioral: Negative for sleep disturbance. The patient is not nervous/anxious.   All other systems reviewed and are negative.    Allergies  Review of patient's allergies indicates no known allergies.  Home Medications   Current Outpatient Rx  Name  Route  Sig  Dispense  Refill  . ALPRAZOLAM 1 MG PO TABS   Oral   Take 1 mg by mouth at bedtime.         . IBUPROFEN 200 MG PO TABS   Oral   Take 600 mg by mouth every 6 (six) hours as needed. For pain         .  ZOLPIDEM TARTRATE 10 MG PO TABS   Oral   Take 10 mg by mouth at bedtime.         Marland Kitchen HYDROCODONE-ACETAMINOPHEN 5-500 MG PO TABS   Oral   Take 1-2 tablets by mouth every 6 (six) hours as needed for pain.   15 tablet   0   . PREDNISONE 20 MG PO TABS   Oral   Take 2 tablets (40 mg total) by mouth daily. Take 40 mg by mouth daily for 3 days, then 20mg  by mouth daily for 3 days, then 10mg  daily for 3 days   12 tablet   0     BP 132/80  Pulse 81  Temp 98.3 F (36.8 C) (Oral)  Resp 24   SpO2 99%  LMP 10/28/2012  Physical Exam  Nursing note and vitals reviewed. Constitutional: She is oriented to person, place, and time. She appears well-developed and well-nourished. No distress.  HENT:  Head: Normocephalic and atraumatic.  Mouth/Throat: Oropharynx is clear and moist. No oropharyngeal exudate.  Eyes: Conjunctivae normal are normal. Pupils are equal, round, and reactive to light. No scleral icterus.  Neck: Normal range of motion. Neck supple.  Cardiovascular: Normal rate, regular rhythm, normal heart sounds and intact distal pulses.  Exam reveals no gallop and no friction rub.   No murmur heard. Pulmonary/Chest: Effort normal. No respiratory distress. She has decreased breath sounds in the right upper field, the right middle field and the right lower field. She has no wheezes. She has no rhonchi. She has no rales. She exhibits no tenderness.  Abdominal: Soft. Bowel sounds are normal. She exhibits no distension and no mass. There is no tenderness. There is no rebound and no guarding.  Musculoskeletal: Normal range of motion. She exhibits no edema and no tenderness.  Lymphadenopathy:    She has no cervical adenopathy.  Neurological: She is alert and oriented to person, place, and time. She exhibits normal muscle tone. Coordination normal.       Speech is clear and goal oriented Moves extremities without ataxia  Skin: Skin is warm and dry. No rash noted. She is not diaphoretic. No erythema. There is pallor.  Psychiatric: She has a normal mood and affect.    ED Course  Procedures (including critical care time)  Labs Reviewed  CBC WITH DIFFERENTIAL - Abnormal; Notable for the following:    WBC 11.1 (*)     Hemoglobin 10.1 (*)     HCT 31.2 (*)     MCH 25.4 (*)     RDW 18.3 (*)     Platelets 537 (*)     Neutrophils Relative 78 (*)     Neutro Abs 8.7 (*)     All other components within normal limits  COMPREHENSIVE METABOLIC PANEL - Abnormal; Notable for the following:     Potassium 3.4 (*)     Glucose, Bld 115 (*)     Albumin 2.9 (*)     Total Bilirubin 0.2 (*)     All other components within normal limits  URINALYSIS, ROUTINE W REFLEX MICROSCOPIC - Abnormal; Notable for the following:    Hgb urine dipstick MODERATE (*)     Leukocytes, UA SMALL (*)     All other components within normal limits  D-DIMER, QUANTITATIVE - Abnormal; Notable for the following:    D-Dimer, Quant 1.84 (*)     All other components within normal limits  URINE MICROSCOPIC-ADD ON - Abnormal; Notable for the following:    Bacteria,  UA FEW (*)     All other components within normal limits  POCT I-STAT TROPONIN I  PREGNANCY, URINE  URINE CULTURE   Dg Chest 2 View  11/03/2012  *RADIOLOGY REPORT*  Clinical Data: Chest pain and shortness of breath.  CHEST - 2 VIEW  Comparison: 12/12/2010  Findings: Normal heart size and pulmonary vascularity.  Breast attenuation over the lower lung fields.  Bilateral breast implants. No focal airspace consolidation.  No blunting of costophrenic angles.  No pneumothorax.  Mediastinal contours appear intact. Tortuous aorta with focal ectasia.  No significant change since previous study.  IMPRESSION: No evidence of active pulmonary disease.  Stable appearance since previous study.   Original Report Authenticated By: Burman Nieves, M.D.    Ct Angio Chest Pe W/cm &/or Wo Cm  11/03/2012  *RADIOLOGY REPORT*  Clinical Data: Chest and back pain.  Takayasu's arteritis.  CT ANGIOGRAPHY CHEST  Technique:  Multidetector CT imaging of the chest using the standard protocol during bolus administration of intravenous contrast. Multiplanar reconstructed images including MIPs were obtained and reviewed to evaluate the vascular anatomy.  Contrast: 80mL OMNIPAQUE IOHEXOL 350 MG/ML SOLN  Comparison: 12/12/2010  Findings: Satisfactory opacification of the pulmonary arteries noted, and there is no evidence of pulmonary emboli.  Again demonstrated is diffuse soft tissue thickening  within the mediastinum surrounding the thoracic aorta.  The extent of soft tissue thickening surrounding the ascending thoracic aorta has increased to 2.0 cm compared to 1.6 cm previously.  This is consistent with worsening vasculitis involving the thoracic aorta. There is no evidence of thoracic aortic dissection or mediastinal hematoma.  No other mediastinal masses or lymphadenopathy identified. 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  No evidence of pleural or pericardial effusion.  Both lungs are clear.  No central endobronchial lesion identified.  IMPRESSION:  1.  No evidence of pulmonary embolism. 2.  Diffuse soft tissue thickening surrounding the thoracic aorta, which shows mild increase in the ascending aorta compared to previous study.  This is consistent with progressive vasculitis of the thoracic aorta. 3.  No evidence of thoracic aortic dissection. 4.  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   Original Report Authenticated By: Myles Rosenthal, M.D.     ECG:  Date: 11/03/2012  Rate: 99  Rhythm: normal sinus rhythm  QRS Axis: normal  Intervals: normal  ST/T Wave abnormalities: normal  Conduction Disutrbances:none  Narrative Interpretation: non-ischemic ECG  Old EKG Reviewed: none available    1. Hypokalemia   2. History of DVT (deep vein thrombosis)   3. TAKAYASU'S ARTERITIS   4. ANEMIA, IRON DEFICIENCY   5. Chest pain       MDM  Luci Bank presents with chest pain similar to past episodes.  Pt with elevated d-dimer.  CT angio pending.  CT angio with findings consistent with progressive vasculitis of the thoracic aorta but no evidence of PE.  No evidence of ACS.  Will give prednisone and pain medication and have pt follow-up with rheumatology.    Dr. Wylie Hail was consulted, evaluated this patient with me and agrees with the plan.     1. Medications: prednisone, vicodin, usual home medications 2.  Treatment: rest, drink plenty of fluids, take medication as prescribed 3. Follow Up: Please followup with your primary doctor for discussion of your diagnoses and further evaluation after today's visit; if you do not have a primary care doctor use the resource guide provided to find  one; f/u with rheumatology per instructions       Dierdre Forth, PA-C 11/03/12 1810

## 2012-11-04 LAB — URINE CULTURE
Colony Count: NO GROWTH
Culture: NO GROWTH

## 2012-11-05 NOTE — ED Provider Notes (Signed)
Medical screening examination/treatment/procedure(s) were performed by non-physician practitioner and as supervising physician I was immediately available for consultation/collaboration.   Letonia Stead L Michael Ventresca, MD 11/05/12 1405 

## 2012-12-05 ENCOUNTER — Emergency Department (HOSPITAL_COMMUNITY): Payer: Medicare Other

## 2012-12-05 ENCOUNTER — Observation Stay (HOSPITAL_COMMUNITY): Payer: Medicare Other

## 2012-12-05 ENCOUNTER — Encounter (HOSPITAL_COMMUNITY): Payer: Self-pay | Admitting: Emergency Medicine

## 2012-12-05 ENCOUNTER — Inpatient Hospital Stay (HOSPITAL_COMMUNITY)
Admission: EM | Admit: 2012-12-05 | Discharge: 2012-12-07 | DRG: 065 | Disposition: A | Discharge status: Home / Self Care | Payer: Medicare Other | Attending: Internal Medicine | Admitting: Internal Medicine

## 2012-12-05 DIAGNOSIS — I749 Embolism and thrombosis of unspecified artery: Secondary | ICD-10-CM

## 2012-12-05 DIAGNOSIS — I6340 Cerebral infarction due to embolism of unspecified cerebral artery: Principal | ICD-10-CM | POA: Diagnosis present

## 2012-12-05 DIAGNOSIS — H53469 Homonymous bilateral field defects, unspecified side: Secondary | ICD-10-CM | POA: Diagnosis present

## 2012-12-05 DIAGNOSIS — M314 Aortic arch syndrome [Takayasu]: Secondary | ICD-10-CM

## 2012-12-05 DIAGNOSIS — Z79899 Other long term (current) drug therapy: Secondary | ICD-10-CM

## 2012-12-05 DIAGNOSIS — F3289 Other specified depressive episodes: Secondary | ICD-10-CM

## 2012-12-05 DIAGNOSIS — N83209 Unspecified ovarian cyst, unspecified side: Secondary | ICD-10-CM

## 2012-12-05 DIAGNOSIS — F329 Major depressive disorder, single episode, unspecified: Secondary | ICD-10-CM

## 2012-12-05 DIAGNOSIS — K5900 Constipation, unspecified: Secondary | ICD-10-CM

## 2012-12-05 DIAGNOSIS — Z8673 Personal history of transient ischemic attack (TIA), and cerebral infarction without residual deficits: Secondary | ICD-10-CM | POA: Diagnosis present

## 2012-12-05 DIAGNOSIS — I739 Peripheral vascular disease, unspecified: Secondary | ICD-10-CM | POA: Diagnosis present

## 2012-12-05 DIAGNOSIS — M216X9 Other acquired deformities of unspecified foot: Secondary | ICD-10-CM

## 2012-12-05 DIAGNOSIS — I6350 Cerebral infarction due to unspecified occlusion or stenosis of unspecified cerebral artery: Secondary | ICD-10-CM

## 2012-12-05 DIAGNOSIS — D509 Iron deficiency anemia, unspecified: Secondary | ICD-10-CM

## 2012-12-05 DIAGNOSIS — R791 Abnormal coagulation profile: Secondary | ICD-10-CM

## 2012-12-05 DIAGNOSIS — Z7901 Long term (current) use of anticoagulants: Secondary | ICD-10-CM

## 2012-12-05 DIAGNOSIS — Z86718 Personal history of other venous thrombosis and embolism: Secondary | ICD-10-CM

## 2012-12-05 DIAGNOSIS — IMO0002 Reserved for concepts with insufficient information to code with codable children: Secondary | ICD-10-CM

## 2012-12-05 DIAGNOSIS — I639 Cerebral infarction, unspecified: Secondary | ICD-10-CM

## 2012-12-05 DIAGNOSIS — G4700 Insomnia, unspecified: Secondary | ICD-10-CM | POA: Diagnosis present

## 2012-12-05 LAB — CBC WITH DIFFERENTIAL/PLATELET
Basophils Absolute: 0 10*3/uL (ref 0.0–0.1)
Basophils Relative: 0 % (ref 0–1)
Eosinophils Absolute: 0 10*3/uL (ref 0.0–0.7)
Eosinophils Relative: 0 % (ref 0–5)
Lymphocytes Relative: 10 % — ABNORMAL LOW (ref 12–46)
MCV: 78.5 fL (ref 78.0–100.0)
Platelets: 402 10*3/uL — ABNORMAL HIGH (ref 150–400)
RDW: 17.8 % — ABNORMAL HIGH (ref 11.5–15.5)
WBC: 14.7 10*3/uL — ABNORMAL HIGH (ref 4.0–10.5)

## 2012-12-05 LAB — BASIC METABOLIC PANEL
CO2: 28 mEq/L (ref 19–32)
Calcium: 9.2 mg/dL (ref 8.4–10.5)
GFR calc Af Amer: >90 mL/min (ref >90)
GFR calc non Af Amer: >90 mL/min (ref >90)
Sodium: 140 mEq/L (ref 135–145)

## 2012-12-05 MED ORDER — PANTOPRAZOLE SODIUM 40 MG IV SOLR
40.0000 mg | INTRAVENOUS | Status: DC
  Administered 2012-12-06: 40 mg via INTRAVENOUS
  Filled 2012-12-05 (×2): qty 40

## 2012-12-05 MED ORDER — MORPHINE SULFATE 4 MG/ML IJ SOLN
4.0000 mg | Freq: Once | INTRAMUSCULAR | Status: AC
  Administered 2012-12-06: 4 mg via INTRAVENOUS
  Filled 2012-12-05: qty 1

## 2012-12-05 MED ORDER — SENNOSIDES-DOCUSATE SODIUM 8.6-50 MG PO TABS
1.0000 | ORAL_TABLET | Freq: Every evening | ORAL | Status: DC | PRN
  Filled 2012-12-05: qty 1

## 2012-12-05 MED ORDER — SODIUM CHLORIDE 0.9 % IV SOLN
INTRAVENOUS | Status: AC
  Administered 2012-12-06: via INTRAVENOUS

## 2012-12-05 NOTE — ED Notes (Signed)
Patient to MRI.

## 2012-12-05 NOTE — ED Provider Notes (Signed)
History     CSN: [id number]  Arrival date & time 12/05/12  1724   First MD Initiated Contact with Patient 12/05/12 1832      Chief Complaint  Patient presents with  . Headache    (Consider location/radiation/quality/duration/timing/severity/associated sxs/prior treatment) HPI  37 year old female with history of Takayasu's arteritis, history of DVT currently on Coumadin, presents complaining of headache. Patient reports she felt sick last week with body aches, generalized fatigue, and decrease in appetite. She has not taken her Coumadin for the past 5 days. For the past 3 days she has had pain to the L side of her head.   Describe pain as gradual in onset, pressure sensation, persistent, but improving. Pt also c/o having difficulty with thinking and concentrating.  Last night while studying she had a brief episode of transient vision loss to the left eye which lasted for about 10 minutes and resolved. Today she noticed that the outer peripheral vision of her right eye is blurry and dark. This has been persistent for the past several hours. Her vision changes is new to her. She does not wear any prescription glasses. She denies any recent trauma patient denies fever, chills, neck stiffness, chest pain, shortness of breath, abdominal pain, nausea, vomiting, or diarrhea, rash.  Past Medical History  Diagnosis Date  . PVD (peripheral vascular disease)   . Benign tumor of back   . Swelling, lymph nodes 2006    intermittant, benign  . DVT (deep venous thrombosis)     Past Surgical History  Procedure Date  . Ivc filter placement   . Breast surgery     bil breast implants    No family history on file.  History  Substance Use Topics  . Smoking status: Never Smoker   . Smokeless tobacco: Not on file  . Alcohol Use: Yes    OB History    Grav Para Term Preterm Abortions TAB SAB Ect Mult Living                  Review of Systems  Constitutional:       10 Systems reviewed and all  are negative for acute change except as noted in the HPI.     Allergies  Review of patient's allergies indicates no known allergies.  Home Medications   Current Outpatient Rx  Name  Route  Sig  Dispense  Refill  . HYDROCODONE-ACETAMINOPHEN 5-500 MG PO TABS   Oral   Take 1-2 tablets by mouth every 6 (six) hours as needed for pain.   15 tablet   0   . PREDNISONE 20 MG PO TABS   Oral   Take 2 tablets (40 mg total) by mouth daily. Take 40 mg by mouth daily for 3 days, then 20mg  by mouth daily for 3 days, then 10mg  daily for 3 days   12 tablet   0     BP 132/65  Pulse 110  Temp 99.5 F (37.5 C) (Oral)  Resp 14  SpO2 97%  LMP 11/04/2012  Physical Exam  Nursing note and vitals reviewed. Constitutional: She is oriented to person, place, and time. She appears well-developed and well-nourished. No distress.  HENT:  Head: Normocephalic and atraumatic.  Right Ear: External ear normal.  Left Ear: External ear normal.  Nose: Nose normal.  Mouth/Throat: Oropharynx is clear and moist. No oropharyngeal exudate.  Eyes: Conjunctivae normal and EOM are normal. Pupils are equal, round, and reactive to light. Right eye exhibits no nystagmus. Left  eye exhibits no nystagmus.  Neck: Neck supple. No JVD present. No tracheal deviation present.       Tenderness to L lateral aspect of neck on palpation without carotid bruits or overlying skin changes  Cardiovascular: Normal rate and regular rhythm.   Pulmonary/Chest: Effort normal and breath sounds normal. No respiratory distress. She exhibits no tenderness.  Abdominal: Soft. There is no tenderness.  Musculoskeletal: Normal range of motion. She exhibits no edema and no tenderness.  Lymphadenopathy:    She has no cervical adenopathy.  Neurological: She is alert and oriented to person, place, and time. She has normal strength. No cranial nerve deficit or sensory deficit. She displays a negative Romberg sign. Coordination and gait normal. GCS eye  subscore is 4. GCS verbal subscore is 5. GCS motor subscore is 6.  Reflex Scores:      Patellar reflexes are 2+ on the right side and 2+ on the left side. Skin: Skin is warm. No rash noted.  Psychiatric: She has a normal mood and affect.    ED Course  Procedures (including critical care time)  Results for orders placed during the hospital encounter of 12/05/12  PROTIME-INR      Component Value Range   Prothrombin Time 17.8 (*) 11.6 - 15.2 seconds   INR 1.51 (*) 0.00 - 1.49  CBC WITH DIFFERENTIAL      Component Value Range   WBC 14.7 (*) 4.0 - 10.5 K/uL   RBC 4.13  3.87 - 5.11 MIL/uL   Hemoglobin 10.3 (*) 12.0 - 15.0 g/dL   HCT 16.1 (*) 09.6 - 04.5 %   MCV 78.5  78.0 - 100.0 fL   MCH 24.9 (*) 26.0 - 34.0 pg   MCHC 31.8  30.0 - 36.0 g/dL   RDW 40.9 (*) 81.1 - 91.4 %   Platelets 402 (*) 150 - 400 K/uL   Neutrophils Relative 83 (*) 43 - 77 %   Neutro Abs 12.3 (*) 1.7 - 7.7 K/uL   Lymphocytes Relative 10 (*) 12 - 46 %   Lymphs Abs 1.4  0.7 - 4.0 K/uL   Monocytes Relative 7  3 - 12 %   Monocytes Absolute 1.0  0.1 - 1.0 K/uL   Eosinophils Relative 0  0 - 5 %   Eosinophils Absolute 0.0  0.0 - 0.7 K/uL   Basophils Relative 0  0 - 1 %   Basophils Absolute 0.0  0.0 - 0.1 K/uL  BASIC METABOLIC PANEL      Component Value Range   Sodium 140  135 - 145 mEq/L   Potassium 3.5  3.5 - 5.1 mEq/L   Chloride 102  96 - 112 mEq/L   CO2 28  19 - 32 mEq/L   Glucose, Bld 108 (*) 70 - 99 mg/dL   BUN 6  6 - 23 mg/dL   Creatinine, Ser 7.82  0.50 - 1.10 mg/dL   Calcium 9.2  8.4 - 95.6 mg/dL   GFR calc non Af Amer >90  >90 mL/min   GFR calc Af Amer >90  >90 mL/min   Ct Head Wo Contrast  12/05/2012  *RADIOLOGY REPORT*  Clinical Data: Headache.  History of Takayasu's arteritis.  CT HEAD WITHOUT CONTRAST  Technique:  Contiguous axial images were obtained from the base of the skull through the vertex without contrast.  Comparison: Brain MRI 12/12/2010.  Findings: In the medial aspect of the left  occipital lobe there is a relatively well-defined area of low attenuation, concerning for late  subacute cerebral ischemia.  No evidence of hemorrhagic transformation.  No acute intracerebral hemorrhage.  No definite mass, mass effect, hydrocephalus or abnormal intra or extra-axial fluid collections.  Visualized paranasal sinuses and mastoids are well pneumatized.  No acute displaced skull fractures are identified.  IMPRESSION: 1.  Findings, as above, concerning for a late subacute ischemia in the medial aspect of the left occipital lobe.  This could be confirmed with contrast enhanced brain MRI if clinically indicated.  These results were called by telephone on 12/05/2012 at 07:42 p.m. to Dr. Laveda Norman, who verbally acknowledged these results.   Original Report Authenticated By: Trudie Reed, M.D.    19:53:14 Visual Acuity BJ Visual Acuity - R Distance: 20/40 ; L Distance: 20/20    1. Subacute stroke, L occipital    MDM  Pt with hx of takayasu arteritis currently taking prednisone presents with L sided headache, difficulty thinking and also R side hemianopsia.  No other focal neuro deficits.  Does have hx of DVT on coumadin, but have not been taking it for the past 5 days.  Will obtain head CT, PT/INR.    8:13 PM Head CT shows late subacute ischemia in the medial aspect of the left occipital lobe.  Result discussed with my attending and with patient.  Will call for admission.    9:06 PM Consult with neuro, Dr. Amada Jupiter appreciated.  He acknowledge and will see pt.  I have consulted [facility], Dr. Lovell Sheehan, who will admit pt to obs, tele, team 10, under her care.  Pt is aware of plan.    BP 124/78  Pulse 104  Temp 98.8 F (37.1 C) (Oral)  Resp 20  SpO2 100%  LMP 11/04/2012  I have reviewed nursing notes and vital signs. I personally reviewed the imaging tests through PACS system  I reviewed available ER/hospitalization records thought the EMR        Fayrene Helper,  [place] 12/05/12 2109

## 2012-12-05 NOTE — ED Notes (Signed)
Patient returned from CT

## 2012-12-05 NOTE — ED Notes (Signed)
Patient states that she has trouble with her peripheral vision to her right eye

## 2012-12-05 NOTE — ED Notes (Addendum)
Pt c/o headache x's 5 days.  St's she had nausea and vomiting on Mon but none now.  Also c/o visual disturbance.  Pt alert and oriented x's 3. Pt describes pain as a pressure

## 2012-12-05 NOTE — Progress Notes (Signed)
ANTICOAGULATION CONSULT NOTE - Initial Consult  Pharmacy Consult for heparin, Coumadin Indication: H/o DVT, r/o CVA   No Known Allergies  Patient Measurements: Height: 5\' 8"  (172.7 cm) Weight: 142 lb (64.411 kg) (Per patient) IBW/kg (Calculated) : 63.9  Heparin Dosing Weight: 64 kg  Vital Signs: Temp: 99.8 F (37.7 C) (01/10 2213) Temp src: Oral (01/10 2213) BP: 133/81 mmHg (01/10 2213) Pulse Rate: 98  (01/10 2213)  Labs:  Basename 12/05/12 1953 12/05/12 1856  HGB 10.3* --  HCT 32.4* --  PLT 402* --  APTT -- --  LABPROT -- 17.8*  INR -- 1.51*  HEPARINUNFRC -- --  CREATININE 0.78 --  CKTOTAL -- --  CKMB -- --  TROPONINI -- --    Estimated Creatinine Clearance: 96.2 ml/min (by C-G formula based on Cr of 0.78).   Medical History: Past Medical History  Diagnosis Date  . PVD (peripheral vascular disease)   . Benign tumor of back   . Swelling, lymph nodes 2006    intermittant, benign  . DVT (deep venous thrombosis)     Medications:  Scheduled:    .  morphine injection  4 mg Intravenous Once  . pantoprazole (PROTONIX) IV  40 mg Intravenous Q24H    Assessment: 37 yo female with h/o DVT on Coumadin admitted with r/o CVA. CT head showed late subacute ischemia. Patient reports not taking Coumadin for past few days. INR (1.51) on admit is below-goal. Pharmacy to manage Coumadin and heparin (no bolus).  Confirmed with Dr. Amada Jupiter okay for heparin using CVA-dosing.   Goal of Therapy:  Heparin level 0.3 - 0.5 units/ml INR 2 - 3  Monitor platelets by anticoagulation protocol: Yes   Plan:  1. Heparin IV infusion of 850 units/hr 2. Heparin level in 6 hours.  3. Daily CBC, heparin level, PT / INR 4. Coumadin 7.5mg  po now. 5. Coumadin education with pharmacist.   Emeline Gins 12/05/2012,11:59 PM

## 2012-12-06 ENCOUNTER — Encounter (HOSPITAL_COMMUNITY): Payer: Self-pay | Admitting: *Deleted

## 2012-12-06 DIAGNOSIS — M314 Aortic arch syndrome [Takayasu]: Secondary | ICD-10-CM

## 2012-12-06 DIAGNOSIS — R791 Abnormal coagulation profile: Secondary | ICD-10-CM

## 2012-12-06 DIAGNOSIS — I6789 Other cerebrovascular disease: Secondary | ICD-10-CM

## 2012-12-06 LAB — PROTIME-INR
INR: 1.27 (ref 0.00–1.49)
Prothrombin Time: 15.6 seconds — ABNORMAL HIGH (ref 11.6–15.2)

## 2012-12-06 LAB — HOMOCYSTEINE: Homocysteine: 7.6 umol/L (ref 4.0–15.4)

## 2012-12-06 LAB — CBC
Platelets: 340 10*3/uL (ref 150–400)
RDW: 18 % — ABNORMAL HIGH (ref 11.5–15.5)
WBC: 12.8 10*3/uL — ABNORMAL HIGH (ref 4.0–10.5)

## 2012-12-06 LAB — HEPARIN LEVEL (UNFRACTIONATED): Heparin Unfractionated: <0.1 IU/mL — ABNORMAL LOW (ref 0.30–0.70)

## 2012-12-06 LAB — HEMOGLOBIN A1C: Hgb A1c MFr Bld: 6.2 % — ABNORMAL HIGH (ref <5.7)

## 2012-12-06 LAB — LIPID PANEL: LDL Cholesterol: 56 mg/dL (ref 0–99)

## 2012-12-06 MED ORDER — PANTOPRAZOLE SODIUM 40 MG PO TBEC
40.0000 mg | DELAYED_RELEASE_TABLET | Freq: Every day | ORAL | Status: DC
  Administered 2012-12-06 – 2012-12-07 (×2): 40 mg via ORAL

## 2012-12-06 MED ORDER — STROKE: EARLY STAGES OF RECOVERY BOOK
Freq: Once | Status: DC
  Filled 2012-12-06: qty 1

## 2012-12-06 MED ORDER — WARFARIN - PHARMACIST DOSING INPATIENT
Freq: Every day | Status: DC
  Administered 2012-12-06: 18:00:00

## 2012-12-06 MED ORDER — ZOLPIDEM TARTRATE 5 MG PO TABS
10.0000 mg | ORAL_TABLET | Freq: Every evening | ORAL | Status: DC | PRN
  Administered 2012-12-06: 5 mg via ORAL
  Filled 2012-12-06: qty 1

## 2012-12-06 MED ORDER — DIAZEPAM 2 MG PO TABS: 1.0000 mg | ORAL_TABLET | Freq: Every evening | ORAL | Status: DC | PRN

## 2012-12-06 MED ORDER — ZOLPIDEM TARTRATE 5 MG PO TABS
5.0000 mg | ORAL_TABLET | Freq: Every evening | ORAL | Status: DC | PRN
  Administered 2012-12-06: 5 mg via ORAL
  Filled 2012-12-06: qty 1

## 2012-12-06 MED ORDER — WARFARIN SODIUM 7.5 MG PO TABS
7.5000 mg | ORAL_TABLET | Freq: Once | ORAL | Status: AC
  Administered 2012-12-06: 7.5 mg via ORAL
  Filled 2012-12-06: qty 1

## 2012-12-06 MED ORDER — MORPHINE SULFATE 2 MG/ML IJ SOLN
2.0000 mg | INTRAMUSCULAR | Status: DC | PRN
  Administered 2012-12-06 (×2): 2 mg via INTRAVENOUS
  Filled 2012-12-06 (×2): qty 1

## 2012-12-06 MED ORDER — HEPARIN (PORCINE) IN NACL 100-0.45 UNIT/ML-% IJ SOLN
1100.0000 [IU]/h | INTRAMUSCULAR | Status: DC
  Administered 2012-12-06: 1050 [IU]/h via INTRAVENOUS
  Administered 2012-12-06: 1100 [IU]/h via INTRAVENOUS
  Administered 2012-12-06: 850 [IU]/h via INTRAVENOUS
  Filled 2012-12-06 (×3): qty 250

## 2012-12-06 NOTE — Progress Notes (Signed)
ANTICOAGULATION CONSULT NOTE - Follow Up Consult  Pharmacy Consult for Heparin, Coumadin Indication: history of recurrent DVTs  No Known Allergies  Patient Measurements: Height: 5\' 8"  (172.7 cm) Weight: 141 lb 8 oz (64.184 kg) IBW/kg (Calculated) : 63.9   Vital Signs: Temp: 98.1 F (36.7 C) (01/11 1500) BP: 118/73 mmHg (01/11 1500) Pulse Rate: 81  (01/11 1500)  Labs:  Basename 12/06/12 1654 12/06/12 0726 12/05/12 1953 12/05/12 1856  HGB -- 8.9* 10.3* --  HCT -- 29.0* 32.4* --  PLT -- 340 402* --  APTT -- -- -- --  LABPROT -- 15.6* -- 17.8*  INR -- 1.27 -- 1.51*  HEPARINUNFRC 0.23* <0.10* -- --  CREATININE -- -- 0.78 --  CKTOTAL -- -- -- --  CKMB -- -- -- --  TROPONINI -- -- -- --    Estimated Creatinine Clearance: 96.2 ml/min (by C-G formula based on Cr of 0.78).   Medications:  Infusions:     . heparin 1,050 Units/hr (12/06/12 0854)    Assessment: 37 year old female admitted with a subacute CVA.  She is on chronic anticoagulation with Coumadin for history of DVT and her INR was subtherapeutic on admission due to several missed doses of Coumadin.  She is being bridged with Heparin (ok per Neuro) at an adjusted therapeutic range due to her recent CVA.  Her heparin level remains sub therapeutic but is much closer to goal on 1050 units/hr, infusing without problem, per nurse and patients headache is much better than this am.    Goal of Therapy:  Heparin level 0.3-0.5 units/ml Monitor platelets by anticoagulation protocol: Yes   Plan:  Increase Heparin to 1100units/hr Recheck Heparin level with am labs  Thank you for allowing pharmacy to be a part of this patients care team.  Lovenia Kim Pharm.D., BCPS Clinical Pharmacist 12/06/2012 5:51 PM Pager: [phone] Phone: [phone]

## 2012-12-06 NOTE — Evaluation (Addendum)
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  Pt has no other residual deficits.  She is functioning at baseline in regards to mobility.  Will not follow and pt needs no other PT.      PT Assessment  Patent does not need any further PT services    Follow Up Recommendations  No PT follow up                Equipment Recommendations  None recommended by PT               Precautions / Restrictions Precautions Precautions: None Restrictions Weight Bearing Restrictions: No   Pertinent Vitals/Pain VSS, No pain      Mobility  Bed Mobility Bed Mobility: Rolling Left;Left Sidelying to Sit Rolling Left: 7: Independent Left Sidelying to Sit: 7: Independent Transfers Transfers: Sit to Stand;Stand to Sit Sit to Stand: 7: Independent Stand to Sit: 7: Independent Ambulation/Gait Ambulation/Gait Assistance: 7: Independent Ambulation Distance (Feet): 85 Feet Assistive device: None Ambulation/Gait Assistance Details: No LOB with challenges Gait Pattern: Step-through pattern Stairs: No Wheelchair Mobility Wheelchair Mobility: No    PT Treatment Interventions: Gait training;Balance training;Patient/family education   PT Goals    Visit Information  Last PT Received On: 12/06/12 Assistance Needed: +1    Subjective Data  Subjective: "I feel like everything is back to normal except my vision is a little messed up in my right eye." Patient Stated Goal: To go home   Prior Functioning  Home Living Lives With: Alone Available Help at Discharge: Available 24 hours/day (pts son going back to college, mom can assist prn) Type of Home: House Home Access: Stairs to enter [employer] of Steps: 3 Entrance Stairs-Rails: None Home Layout: Two level;Bed/bath upstairs Alternate Level Stairs-Number of Steps: flight Alternate Level Stairs-Rails: Right Bathroom Shower/Tub: Health visitor: Standard Home Adaptive Equipment: None Prior Function Level of Independence: Independent Able to Take Stairs?: Yes Driving: Yes Communication Communication: No difficulties Dominant Hand: Right    Cognition  Overall Cognitive Status: Appears within functional limits for tasks assessed/performed Arousal/Alertness: Awake/alert Orientation Level: Appears intact for tasks assessed Behavior During Session: [facility] for tasks performed    Extremity/Trunk Assessment Right Lower Extremity Assessment RLE ROM/Strength/Tone: [facility] for tasks assessed Left Lower Extremity Assessment LLE ROM/Strength/Tone: [facility] for tasks assessed Trunk Assessment Trunk Assessment: Normal   Balance Static Standing Balance Single Leg Stance - Left Leg: 15  Tandem Stance - Left Leg: 35  High Level Balance High Level Balance Activites: Direction changes;Backward walking;Side stepping;Turns;Sudden stops;Head turns High Level Balance Comments: Performs all of the above with independence without LOB  End of Session PT - End of Session Equipment Utilized During Treatment: Gait belt Activity Tolerance: Patient tolerated treatment well Patient left: in bed;with call bell/phone within reach Nurse Communication: Mobility status  GP Functional Assessment Tool Used: Clinical judgment Functional Limitation: Mobility: Walking and moving around Mobility: Walking and Moving Around Current Status (N5621): 0 percent impaired, limited or restricted Mobility: Walking and Moving Around Goal Status (H0865): 0 percent impaired, limited or restricted Mobility: Walking and Moving Around Discharge Status [phone]): 0 percent impaired, limited or restricted   INGOLD,Lashauna Arpin 12/06/2012, 10:08 AM  [facility] [phone] (pager)

## 2012-12-06 NOTE — Progress Notes (Signed)
[facility] PROGRESS NOTE  [name] QIO:[id number] DOB: October 08, 1976 DOA: 12/05/2012 PCP: Egbert Garibaldi, NP  Assessment/Plan: 1. CVA- heparin gtt to coumadin, if patient does ok with heparin will change to lovenox tomm and d/c home.  No PT or SLP follow up needed, await echo, carotids, HgbA1C and FLP, neuro recommends checking hypercoagulable work up  Code Status: full Family Communication: patient at bedside Disposition Plan: home tomm/Monday   Consultants:  neurology  Procedures:    Antibiotics:    HPI/Subjective: Patient c/o headache and not being able to sleep Take valuim and Ambien together every night to sleep at home   Objective: Filed Vitals:   12/06/12 0124 12/06/12 0324 12/06/12 0400 12/06/12 0600  BP: 127/73 133/75 122/74 130/79  Pulse: 88 87 91 92  Temp: 98.3 F (36.8 C)     TempSrc: Oral     Resp: 16 16 20 20   Height: 5\' 8"  (1.727 m)     Weight: 64.411 kg (142 lb) 64.184 kg (141 lb 8 oz)    SpO2: 99% 99%     No intake or output data in the 24 hours ending 12/06/12 1037 Filed Weights   12/05/12 2300 12/06/12 0124 12/06/12 0324  Weight: 64.411 kg (142 lb) 64.411 kg (142 lb) 64.184 kg (141 lb 8 oz)    Exam:   General:  A+Ox3, NAD  Cardiovascular: rrr  Respiratory: clear anterior  Abdomen: +BS, soft, NT/ND  Data Reviewed: Basic Metabolic Panel:  Lab 12/05/12 3244  NA 140  K 3.5  CL 102  CO2 28  GLUCOSE 108*  BUN 6  CREATININE 0.78  CALCIUM 9.2  MG --  PHOS --   Liver Function Tests: No results found for this basename: AST:5,ALT:5,ALKPHOS:5,BILITOT:5,PROT:5,ALBUMIN:5 in the last 168 hours No results found for this basename: LIPASE:5,AMYLASE:5 in the last 168 hours No results found for this basename: AMMONIA:5 in the last 168 hours CBC:  Lab 12/06/12 0726 12/05/12 1953  WBC 12.8* 14.7*  NEUTROABS -- 12.3*  HGB 8.9* 10.3*  HCT 29.0* 32.4*  MCV 79.7 78.5  PLT 340 402*   Cardiac Enzymes: No results found for  this basename: CKTOTAL:5,CKMB:5,CKMBINDEX:5,TROPONINI:5 in the last 168 hours BNP (last 3 results) No results found for this basename: PROBNP:3 in the last 8760 hours CBG: No results found for this basename: GLUCAP:5 in the last 168 hours  No results found for this or any previous visit (from the past 240 hour(s)).   Studies: Dg Chest 2 View  12/05/2012  *RADIOLOGY REPORT*  Clinical Data: Shortness of breath, fever, nausea, vomiting.  CHEST - 2 VIEW  Comparison: 11/03/2012  Findings: Heart is normal size.  Prominence of the proximal descending thoracic aorta.  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  No change in the appearance since prior study.  Lungs are clear.  No effusions.  No acute bony abnormality.  IMPRESSION: No acute cardiopulmonary disease.   Original Report Authenticated By: Charlett Nose, M.D.    Ct Head Wo Contrast  12/05/2012  *RADIOLOGY REPORT*  Clinical Data: Headache.  History of Takayasu's arteritis.  CT HEAD WITHOUT CONTRAST  Technique:  Contiguous axial images were obtained from the base of the skull through the vertex without contrast.  Comparison: Brain MRI 12/12/2010.  Findings: In the medial aspect of the left occipital lobe there is a relatively well-defined area of low attenuation, concerning for late subacute cerebral ischemia.  No evidence  of hemorrhagic transformation.  No acute intracerebral hemorrhage.  No definite mass, mass effect, hydrocephalus or abnormal intra or extra-axial fluid collections.  Visualized paranasal sinuses and mastoids are well pneumatized.  No acute displaced skull fractures are identified.  IMPRESSION: 1.  Findings, as above, concerning for a late subacute ischemia in the medial aspect of the left occipital lobe.  This could be confirmed with contrast enhanced brain MRI if clinically indicated.  These results were called by telephone on 12/05/2012  at 07:42 p.m. to Dr. Laveda Norman, who verbally acknowledged these results.   Original Report Authenticated By: Trudie Reed, M.D.    Mr Brain Wo Contrast  12/06/2012  *RADIOLOGY REPORT*  Clinical Data:  37 year old female with history of Takayasu's arteritis.  Headache, nausea, vomiting.  Visual disturbance, evidence of left PCA infarct on CT.  Comparison: Head CT 12/05/2012.  Brain MRI and MRA 12/12/2010.  MRI HEAD WITHOUT CONTRAST  Technique: Multiplanar, multiecho pulse sequences of the brain and surrounding structures were obtained according to standard protocol without intravenous contrast.  Findings: Confluent restricted diffusion in the dorsal left thalamus and medial left occipital lobe, PCA territory.  No definite brain stem or cerebellar involvement.  Associated cytotoxic edema in the affected parenchyma.  No evidence of associated hemorrhage.  No significant mass effect.  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  Chronic diminutive distal left vertebral artery.  MRA findings are below.  Outside of the left PCA territory, gray and white matter signal is within normal limits for age.  Normal cerebral volume.  No ventriculomegaly.  No midline shift.  Negative pituitary, cervicomedullary junction and visualized cervical spine. Visualized bone marrow signal is within normal limits.  Visualized orbit soft tissues are within normal limits.  Visualized paranasal sinuses and mastoids are clear.  Negative scalp soft tissues.  IMPRESSION: 1.  Confluent acute left PCA infarct affecting the dorsal left thalamus and medial left occipital lobe.  Edema without significant mass effect or evidence of associated hemorrhage. 2.  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 Chronic diminutive left vertebral artery.  See MRA findings below.  MRA HEAD WITHOUT CONTRAST  Technique: Angiographic images of the Circle of  [place] were obtained using MRA technique without  intravenous contrast.  Findings: Stable antegrade flow in the distal right vertebral artery.  Right PICA origin is stable.  Incidental linear flow signal re-identified through the right glossopharyngeal canal.  The right vertebral artery primarily supplies the basilar.  There is some flow signal in a diminutive distal left vertebral artery and the left PICA, stable.  The basilar artery is somewhat gracile but stable without focal stenosis.  Dominant right AICA again noted.  SCA origins and basilar tip are patent.  Fetal type PCA origins, more so the left.  The superior division of the left PCA is occluded.  Previously this was patent.  The left P1 and T2 segments are minimally irregular compared the prior.  Right PCA branches appears stable and within normal limits.  Stable antegrade flow in the distal cervical right ICA and right ICA siphon.  No flow signal detected in the distal cervical left ICA, or in the left ICA siphon proximal to the cavernous segment. Scant reconstituted flow in the cavernous segment and anterior genu.  More robust reconstitution of the supraclinoid left ICA from the left posterior communicating artery as before.  No right ICA stenosis.  Stable and fairly symmetric  appearing flow at both carotid termini. MCA and ACA origins are stable.  The anterior communicating artery is ectatic and gives rise to a medium artery of the corpus callosum.  2-3 mm vessel diameter in this region appears related to ectasia and infundibulum rather than discrete saccular aneurysm.  This finding is stable.  Other visualized ACA branches are stable and within normal limits. Visualized bilateral MCA branches are stable and within normal limits.  IMPRESSION: 1.  Newly occluded left PCA superior division. Minimal irregularity left P1 and P2 segment. 2.  Otherwise stable posterior circulation including chronically diminutive distal left vertebral artery and fetal type  PCA origins. 3.  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 4.  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  No discrete aneurysm.  Attention directed on follow-up.   Original Report Authenticated By: Erskine Speed, M.D.    Mr Mra Head/brain Wo Cm  12/06/2012  *RADIOLOGY REPORT*  Clinical Data:  37 year old female with history of Takayasu's arteritis.  Headache, nausea, vomiting.  Visual disturbance, evidence of left PCA infarct on CT.  Comparison: Head CT 12/05/2012.  Brain MRI and MRA 12/12/2010.  MRI HEAD WITHOUT CONTRAST  Technique: Multiplanar, multiecho pulse sequences of the brain and surrounding structures were obtained according to standard protocol without intravenous contrast.  Findings: Confluent restricted diffusion in the dorsal left thalamus and medial left occipital lobe, PCA territory.  No definite brain stem or cerebellar involvement.  Associated cytotoxic edema in the affected parenchyma.  No evidence of associated hemorrhage.  No significant mass effect.  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  Chronic diminutive distal left vertebral artery.  MRA findings are below.  Outside of the left PCA territory, gray and white matter signal is within normal limits for age.  Normal cerebral volume.  No ventriculomegaly.  No midline shift.  Negative pituitary, cervicomedullary junction and visualized cervical spine. Visualized bone marrow signal is within normal limits.  Visualized orbit soft tissues are within normal limits.  Visualized paranasal sinuses and mastoids are clear.  Negative scalp soft tissues.  IMPRESSION: 1.  Confluent acute left PCA infarct affecting the dorsal left thalamus and medial left  occipital lobe.  Edema without significant mass effect or evidence of associated hemorrhage. 2.  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 Chronic diminutive left vertebral artery.  See MRA findings below.  MRA HEAD WITHOUT CONTRAST  Technique: Angiographic images of the Circle of [place] were obtained using MRA technique without  intravenous contrast.  Findings: Stable antegrade flow in the distal right vertebral artery.  Right PICA origin is stable.  Incidental linear flow signal re-identified through the right glossopharyngeal canal.  The right vertebral artery primarily supplies the basilar.  There is some flow signal in a diminutive distal left vertebral artery and the left PICA, stable.  The basilar artery is somewhat gracile but stable without focal stenosis.  Dominant right AICA again noted.  SCA origins and basilar tip are patent.  Fetal type PCA origins, more so the left.  The superior division of the left PCA is occluded.  Previously this was patent.  The left P1 and T2 segments are minimally irregular compared the prior.  Right PCA branches appears stable and within normal limits.  Stable antegrade flow in  the distal cervical right ICA and right ICA siphon.  No flow signal detected in the distal cervical left ICA, or in the left ICA siphon proximal to the cavernous segment. Scant reconstituted flow in the cavernous segment and anterior genu.  More robust reconstitution of the supraclinoid left ICA from the left posterior communicating artery as before.  No right ICA stenosis.  Stable and fairly symmetric appearing flow at both carotid termini. MCA and ACA origins are stable.  The anterior communicating artery is ectatic and gives rise to a medium artery of the corpus callosum.  2-3 mm vessel diameter in this region appears related to ectasia and infundibulum rather than discrete saccular aneurysm.  This finding is stable.  Other visualized ACA branches are stable and within normal limits.  Visualized bilateral MCA branches are stable and within normal limits.  IMPRESSION: 1.  Newly occluded left PCA superior division. Minimal irregularity left P1 and P2 segment. 2.  Otherwise stable posterior circulation including chronically diminutive distal left vertebral artery and fetal type PCA origins. 3.  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 4.  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  No discrete aneurysm.  Attention directed on follow-up.   Original Report Authenticated By: Erskine Speed, M.D.     Scheduled Meds:   .  stroke: mapping our early stages of recovery book   Does not apply Once  . sodium chloride   Intravenous STAT  . pantoprazole  40 mg Oral Daily  . warfarin  7.5 mg Oral ONCE-1800  . Warfarin - Pharmacist Dosing Inpatient   Does not apply q1800   Continuous Infusions:   . heparin 1,050 Units/hr (12/06/12 0854)    Principal Problem:  *CVA (cerebral infarction) Active Problems:  TAKAYASU'S ARTERITIS  THROMBOSIS, VENOUS  Subtherapeutic international normalized ratio (INR)    Time spent: 35    [facility], [place]  [facility] Pager [phone]. If 8PM-8AM, please contact night-coverage at [email or web address], password [facility] 12/06/2012, 10:37 AM  LOS: 1 day

## 2012-12-06 NOTE — Progress Notes (Signed)
Stroke Team Progress Note  HISTORY  [name] is a 37 y.o. 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 She has a subtheraputic INR at 1.5. She noted yesterday that she had some perippheral vision loss in her right eye. Then yesterday, for about ten minutes she lost all vision in her left eye. She denies any weakness or numbness, but has had some difficulty concentrating. She recently restarted prednisone for her Takayasu's arteritis Slight improvement in her vision today.    OBJECTIVE Most recent Vital Signs: Filed Vitals:   12/06/12 0124 12/06/12 0324 12/06/12 0400 12/06/12 0600  BP: 127/73 133/75 122/74 130/79  Pulse: 88 87 91 92  Temp: 98.3 F (36.8 C)     TempSrc: Oral     Resp: 16 16 20 20   Height: 5\' 8"  (1.727 m)     Weight: 64.411 kg (142 lb) 64.184 kg (141 lb 8 oz)    SpO2: 99% 99%     CBG (last 3)  No results found for this basename: GLUCAP:3 in the last 72 hours  IV Fluid Intake:     . heparin 1,050 Units/hr (12/06/12 0854)    MEDICATIONS    .  stroke: mapping our early stages of recovery book   Does not apply Once  . sodium chloride   Intravenous STAT  . pantoprazole  40 mg Oral Daily  . warfarin  7.5 mg Oral ONCE-1800  . Warfarin - Pharmacist Dosing Inpatient   Does not apply q1800   PRN:  morphine injection, senna-docusate  Diet:  NPO  Activity:  Bedrest CLINICALLY SIGNIFICANT STUDIES Basic Metabolic Panel:  Lab 12/05/12 4540  NA 140  K 3.5  CL 102  CO2 28  GLUCOSE 108*  BUN 6  CREATININE 0.78  CALCIUM 9.2  MG --  PHOS --   Liver Function Tests: No results found for this basename: AST:2,ALT:2,ALKPHOS:2,BILITOT:2,PROT:2,ALBUMIN:2 in the last 168 hours CBC:  Lab 12/06/12 0726 12/05/12 1953  WBC 12.8* 14.7*  NEUTROABS -- 12.3*  HGB 8.9* 10.3*  HCT 29.0* 32.4*  MCV 79.7 78.5  PLT 340 402*   Coagulation:    Lab 12/06/12 0726 12/05/12 1856  LABPROT 15.6* 17.8*  INR 1.27 1.51*   Cardiac Enzymes: No results found for this basename: CKTOTAL:3,CKMB:3,CKMBINDEX:3,TROPONINI:3 in the last 168 hours Urinalysis: No results found for this basename: COLORURINE:2,APPERANCEUR:2,LABSPEC:2,PHURINE:2,GLUCOSEU:2,HGBUR:2,BILIRUBINUR:2,KETONESUR:2,PROTEINUR:2,UROBILINOGEN:2,NITRITE:2,LEUKOCYTESUR:2 in the last 168 hours Lipid Panel    Component Value Date/Time   CHOL 123 12/06/2012 0726   TRIG 67 12/06/2012 0726   HDL 54 12/06/2012 0726   CHOLHDL 2.3 12/06/2012 0726   VLDL 13 12/06/2012 0726   LDLCALC 56 12/06/2012 0726   HgbA1C  No results found for this basename: HGBA1C    Urine Drug Screen:   No results found for this basename: labopia, cocainscrnur, labbenz, amphetmu, thcu, labbarb    Alcohol Level: No results found for this basename: ETH:2 in the last 168 hours  Dg Chest 2 View  12/05/2012  *RADIOLOGY REPORT*  Clinical Data: Shortness of breath, fever, nausea, vomiting.  CHEST - 2 VIEW  Comparison: 11/03/2012  Findings: Heart is normal size.  Prominence of the proximal descending thoracic aorta.  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  No change in the appearance since prior study.  Lungs are  clear.  No effusions.  No acute bony abnormality.  IMPRESSION: No acute cardiopulmonary disease.   Original Report Authenticated By: Charlett Nose, M.D.    Ct Head Wo Contrast  12/05/2012  *RADIOLOGY REPORT*  Clinical Data: Headache.  History of Takayasu's arteritis.  CT HEAD WITHOUT CONTRAST  Technique:  Contiguous axial images were obtained from the base of the skull through the vertex without contrast.  Comparison: Brain MRI 12/12/2010.  Findings: In the medial aspect of the left occipital lobe there is a relatively well-defined area of low attenuation, concerning for late subacute cerebral ischemia.  No evidence of  hemorrhagic transformation.  No acute intracerebral hemorrhage.  No definite mass, mass effect, hydrocephalus or abnormal intra or extra-axial fluid collections.  Visualized paranasal sinuses and mastoids are well pneumatized.  No acute displaced skull fractures are identified.  IMPRESSION: 1.  Findings, as above, concerning for a late subacute ischemia in the medial aspect of the left occipital lobe.  This could be confirmed with contrast enhanced brain MRI if clinically indicated.  These results were called by telephone on 12/05/2012 at 07:42 p.m. to Dr. Laveda Norman, who verbally acknowledged these results.   Original Report Authenticated By: Trudie Reed, M.D.    Mr Brain Wo Contrast  12/06/2012  *RADIOLOGY REPORT*  Clinical Data:  37 year old female with history of Takayasu's arteritis.  Headache, nausea, vomiting.  Visual disturbance, evidence of left PCA infarct on CT.  Comparison: Head CT 12/05/2012.  Brain MRI and MRA 12/12/2010.  MRI HEAD WITHOUT CONTRAST  Technique: Multiplanar, multiecho pulse sequences of the brain and surrounding structures were obtained according to standard protocol without intravenous contrast.  Findings: Confluent restricted diffusion in the dorsal left thalamus and medial left occipital lobe, PCA territory.  No definite brain stem or cerebellar involvement.  Associated cytotoxic edema in the affected parenchyma.  No evidence of associated hemorrhage.  No significant mass effect.  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  Chronic diminutive distal left vertebral artery.  MRA findings are below.  Outside of the left PCA territory, gray and white matter signal is within normal limits for age.  Normal cerebral volume.  No ventriculomegaly.  No midline shift.  Negative pituitary, cervicomedullary junction and visualized cervical spine. Visualized bone marrow signal is within normal  limits.  Visualized orbit soft tissues are within normal limits.  Visualized paranasal sinuses and mastoids are clear.  Negative scalp soft tissues.  IMPRESSION: 1.  Confluent acute left PCA infarct affecting the dorsal left thalamus and medial left occipital lobe.  Edema without significant mass effect or evidence of associated hemorrhage. 2.  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 Chronic diminutive left vertebral artery.  See MRA findings below.  MRA HEAD WITHOUT CONTRAST  Technique: Angiographic images of the Circle of Willis were obtained using MRA technique without  intravenous contrast.  Findings: Stable antegrade flow in the distal right vertebral artery.  Right PICA origin is stable.  Incidental linear flow signal re-identified through the right glossopharyngeal canal.  The right vertebral artery primarily supplies the basilar.  There is some flow signal in a diminutive distal left vertebral artery and the left PICA, stable.  The basilar artery is somewhat gracile but stable without focal stenosis.  Dominant right AICA again noted.  SCA origins and basilar tip are patent.  Fetal type PCA origins, more so the left.  The superior division of the left PCA  is occluded.  Previously this was patent.  The left P1 and T2 segments are minimally irregular compared the prior.  Right PCA branches appears stable and within normal limits.  Stable antegrade flow in the distal cervical right ICA and right ICA siphon.  No flow signal detected in the distal cervical left ICA, or in the left ICA siphon proximal to the cavernous segment. Scant reconstituted flow in the cavernous segment and anterior genu.  More robust reconstitution of the supraclinoid left ICA from the left posterior communicating artery as before.  No right ICA stenosis.  Stable and fairly symmetric appearing flow at both carotid termini. MCA and ACA origins are stable.  The anterior communicating artery is ectatic and gives rise to a  medium artery of the corpus callosum.  2-3 mm vessel diameter in this region appears related to ectasia and infundibulum rather than discrete saccular aneurysm.  This finding is stable.  Other visualized ACA branches are stable and within normal limits. Visualized bilateral MCA branches are stable and within normal limits.  IMPRESSION: 1.  Newly occluded left PCA superior division. Minimal irregularity left P1 and P2 segment. 2.  Otherwise stable posterior circulation including chronically diminutive distal left vertebral artery and fetal type PCA origins. 3.  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 4.  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  No discrete aneurysm.  Attention directed on follow-up.   Original Report Authenticated By: Erskine Speed, M.D.    Mr Mra Head/brain Wo Cm  12/06/2012  *RADIOLOGY REPORT*  Clinical Data:  37 year old female with history of Takayasu's arteritis.  Headache, nausea, vomiting.  Visual disturbance, evidence of left PCA infarct on CT.  Comparison: Head CT 12/05/2012.  Brain MRI and MRA 12/12/2010.  MRI HEAD WITHOUT CONTRAST  Technique: Multiplanar, multiecho pulse sequences of the brain and surrounding structures were obtained according to standard protocol without intravenous contrast.  Findings: Confluent restricted diffusion in the dorsal left thalamus and medial left occipital lobe, PCA territory.  No definite brain stem or cerebellar involvement.  Associated cytotoxic edema in the affected parenchyma.  No evidence of associated hemorrhage.  No significant mass effect.  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  Chronic diminutive distal left vertebral artery.  MRA findings are  below.  Outside of the left PCA territory, gray and white matter signal is within normal limits for age.  Normal cerebral volume.  No ventriculomegaly.  No midline shift.  Negative pituitary, cervicomedullary junction and visualized cervical spine. Visualized bone marrow signal is within normal limits.  Visualized orbit soft tissues are within normal limits.  Visualized paranasal sinuses and mastoids are clear.  Negative scalp soft tissues.  IMPRESSION: 1.  Confluent acute left PCA infarct affecting the dorsal left thalamus and medial left occipital lobe.  Edema without significant mass effect or evidence of associated hemorrhage. 2.  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 Chronic diminutive left vertebral artery.  See MRA findings below.  MRA HEAD WITHOUT CONTRAST  Technique: Angiographic images of the Circle of Willis were obtained using MRA technique without  intravenous contrast.  Findings: Stable antegrade flow in the distal right vertebral artery.  Right PICA origin is stable.  Incidental linear flow signal re-identified through the right glossopharyngeal canal.  The  right vertebral artery primarily supplies the basilar.  There is some flow signal in a diminutive distal left vertebral artery and the left PICA, stable.  The basilar artery is somewhat gracile but stable without focal stenosis.  Dominant right AICA again noted.  SCA origins and basilar tip are patent.  Fetal type PCA origins, more so the left.  The superior division of the left PCA is occluded.  Previously this was patent.  The left P1 and T2 segments are minimally irregular compared the prior.  Right PCA branches appears stable and within normal limits.  Stable antegrade flow in the distal cervical right ICA and right ICA siphon.  No flow signal detected in the distal cervical left ICA, or in the left ICA siphon proximal to the cavernous segment. Scant reconstituted flow in the cavernous segment and anterior genu.  More robust  reconstitution of the supraclinoid left ICA from the left posterior communicating artery as before.  No right ICA stenosis.  Stable and fairly symmetric appearing flow at both carotid termini. MCA and ACA origins are stable.  The anterior communicating artery is ectatic and gives rise to a medium artery of the corpus callosum.  2-3 mm vessel diameter in this region appears related to ectasia and infundibulum rather than discrete saccular aneurysm.  This finding is stable.  Other visualized ACA branches are stable and within normal limits. Visualized bilateral MCA branches are stable and within normal limits.  IMPRESSION: 1.  Newly occluded left PCA superior division. Minimal irregularity left P1 and P2 segment. 2.  Otherwise stable posterior circulation including chronically diminutive distal left vertebral artery and fetal type PCA origins. 3.  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 4.  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  No discrete aneurysm.  Attention directed on follow-up.   Original Report Authenticated By: Erskine Speed, M.D.     CT of the brain  subacute ischemia in  the medial aspect of the left occipital lobe   MRI of the brain  acute left PCA infarct affecting the dorsal left  thalamus and medial left occipital lobe. Edema without significant  mass effect or evidence of associated hemorrhage   MRA of the brain  Newly occluded left PCA superior division. Minimal irregularity left P1 and P2 segment.   2D Echocardiogram    Carotid Doppler    CXR    EKG  normal EKG, normal sinus rhythm, unchanged from previous tracings.   Therapy Recommendations pt/ot  Physical Exam   General: In bed, NAD  CV: RRR  Mental Status:  Patient is awake, alert, oriented to person, place, month, year,  and situation.  Immediate and remote memory are intact.  Patient is able to give a clear and coherent history.  Able to spell world backwards without difficulty   Cranial Nerves:  II: Visual Fields are notable for a right HH.  Pupils are equal, round, and reactive to light. Discs are difficult to visualize.  III,IV, VI: EOMI without ptosis or diploplia.  V: Facial sensation is symmetric to temperature  VII: Facial movement is symmetric.  VIII: hearing is intact to voice  X: Uvula elevates symmetrically  XI: Shoulder shrug is symmetric.  XII: tongue is midline without atrophy or fasciculations.  Motor:  Tone is normal. Bulk is normal. 5/5 strength was present in all four extremities.  Sensory:  Sensation is symmetric to light touch and temperature in the arms and legs.  Deep Tendon Reflexes:  2+ and symmetric in the biceps and patellae.  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 On coumadin from chronic LE dvt's that was sub therapeutic. Pt is on Heparin gtt with bridging to coumadin. She also has IVC filter.     Hospital day # 1  TREATMENT/PLAN  Con't coumadin  Pt states she is back to baseline, if so and she passes speech and swallow eval as well as PT she can most likely be d/c home on lovenox injections with coumadin bridging. Pt is aware of how to do lovenox injections.    She passed her speech eval  Finish rest of stroke work up, make sure it is done such as carotids, 2d echo, HbA1c, lipids.   Would also check hypercoagulable profile.   Will s/off please call with questions.    I have personally obtained a history, examined the patient, evaluated imaging results, and formulated the assessment and plan of care. I agree with the above.   Pauletta Browns

## 2012-12-06 NOTE — Evaluation (Signed)
Speech Language Pathology Evaluation Patient Details Name: [name] MRN: [id number] DOB: February 12, 1976 Today's Date: 12/06/2012 Time: 4098-1191 SLP Time Calculation (min): 17 min  Problem List:  Patient Active Problem List  Diagnosis  . ANEMIA, IRON DEFICIENCY  . DEPRESSION  . TAKAYASU'S ARTERITIS  . THROMBOSIS, VENOUS  . CONSTIPATION  . OVARIAN CYST  . FOOT DROP, LEFT  . CVA (cerebral infarction)  . Subtherapeutic international normalized ratio (INR)   Past Medical History:  Past Medical History  Diagnosis Date  . PVD (peripheral vascular disease)   . Benign tumor of back   . Swelling, lymph nodes 2006    intermittant, benign  . DVT (deep venous thrombosis)    Past Surgical History:  Past Surgical History  Procedure Date  . Ivc filter placement   . Breast surgery     bil breast implants   HPI:  37 y/o female with history Takaysaus's Arteritis and history of recurrent DVTs admitted in ED with reports of 3 days of severe, dull headache in back and left side of her head.  Patient reports transitent loss of vision in her left eye for 1 day prior to admission that lasted 10 minutes.  Reports of increased nausea and vomiting with inability to take her prednisone or coumadin for 4 days.  In ED patient found to have a subacute Left Occipital Infarct.  Patient referred for Cognitive Linguistics Evaluation per stroke protocol.    Assessment / Plan / Recommendation Clinical Impression  Cognitive Linguistic skills judged to be baseline and functional.  No further Speech Pathology services warranted at this time.  ST to sign off as education complete.     SLP Assessment  Patient does not need any further Speech Lanaguage Pathology Services    Follow Up Recommendations  None        SLP Evaluation Prior Functioning  Cognitive/Linguistic Baseline: Within functional limits Type of Home: House Lives With: Alone Available Help at Discharge: Family;Available 24  hours/day Education: Taking college classes on line.    Cognition  Overall Cognitive Status: Appears within functional limits for tasks assessed Arousal/Alertness: Awake/alert    Comprehension  Auditory Comprehension Overall Auditory Comprehension: Appears within functional limits for tasks assessed    Expression Expression Primary Mode of Expression: Verbal Verbal Expression Overall Verbal Expression: Appears within functional limits for tasks assessed   Oral / Motor Oral Motor/Sensory Function Overall Oral Motor/Sensory Function: Appears within functional limits for tasks assessed   GO    Moreen Fowler MS, CCC-SLP [phone] [facility] 12/06/2012, 2:53 PM

## 2012-12-06 NOTE — Consult Note (Signed)
Reason for Consult: Stroke Referring Physician: Fayrene Helper  CC: Peripheral vision loss  History is obtained from:Patient  HPI: [name] is a 37 y.o. 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 She has a subtheraputic INR at 1.5. She noted yesterday that she had some perippheral vision loss in her right eye. Then yesterday, for about ten minutes she lost all vision in her left eye. She denies any weakness or numbness, but has had some difficulty concentrating. She recently restarted prednisone for her Takayasu's arteritis.   ROS: A 14 point ROS was performed and is negative except as noted in the HPI.  Past Medical History  Diagnosis Date  . PVD (peripheral vascular disease)   . Benign tumor of back   . Swelling, lymph nodes 2006    intermittant, benign  . DVT (deep venous thrombosis)     Family History: Mother - CVA  Social History: Tob: denies  Exam: Current vital signs: BP 133/81  Pulse 98  Temp 99.8 F (37.7 C) (Oral)  Resp 20  Ht 5\' 8"  (1.727 m)  Wt 64.411 kg (142 lb)  BMI 21.59 kg/m2  SpO2 100%  LMP 11/04/2012 Vital signs in last 24 hours: Temp:  [98.8 F (37.1 C)-99.8 F (37.7 C)] 99.8 F (37.7 C) (01/10 2213) Pulse Rate:  [98-110] 98  (01/10 2213) Resp:  [14-20] 20  (01/10 2213) BP: (124-133)/(65-81) 133/81 mmHg (01/10 2213) SpO2:  [97 %-100 %] 100 % (01/10 2213) Weight:  [64.411 kg (142 lb)] 64.411 kg (142 lb) (01/10 2300)  General: In bed, NAD CV: RRR Mental Status: Patient is awake, alert, oriented to person, place, month, year, and situation. Immediate and remote memory are intact. Patient is able to give a clear and coherent history. Able to spell world backwards without difficulty  # of quarters in $2.75 given as 7 Cranial Nerves: II: Visual Fields are notable for a right upper quadrantanopia. Pupils are  equal, round, and reactive to light.  Discs are difficult to visualize. III,IV, VI: EOMI without ptosis or diploplia.  V: Facial sensation is symmetric to temperature VII: Facial movement is symmetric.  VIII: hearing is intact to voice X: Uvula elevates symmetrically XI: Shoulder shrug is symmetric. XII: tongue is midline without atrophy or fasciculations.  Motor: Tone is normal. Bulk is normal. 5/5 strength was present in all four extremities.  Sensory: Sensation is symmetric to light touch and temperature in the arms and legs. Deep Tendon Reflexes: 2+ and symmetric in the biceps and patellae.  Cerebellar: FNF  intact bilaterally Gait: Did not assess due to mulitiple monitors in the ER setting  I have reviewed labs in epic and the results pertinent to this consultation are: BMP - wnl CBC - elevated WBC  I have reviewed the images obtained:MRI brain moderate sized infarct in the left PCA territory. Old left ICA occlusion.   Impression: 37 yo F with at least two events, one in anterior circulation(transient vision loss in single eye) and one in the posterior circulation(PCA) in the setting of recently becoming subtheraputic on coumadin. Given the different circulation, this would be suggestive of cardiac source, or possibly PFO with DVT.   Recommendations: 1. HgbA1c, fasting lipid panel 2. MRI, MRA  of the brain without contrast 3. PT consult, OT consult, Speech consult 4. Echocardiogram 5. Carotid dopplers 6. Risk factor modification  7. Telemetry monitoring 8. Frequent neuro checks 9. Given that she has had two likely embolic events in the past 24h, it may be reasonable to start low dose heparin until Theraputic on coumadin. There is some risk of intracranial bleeding, but with a moderate sized infarct, I feel this to be relatively low.   Ritta Slot, MD [facility] [phone]  If 7pm- 7am, please page neurology on call at [phone].

## 2012-12-06 NOTE — Progress Notes (Signed)
ANTICOAGULATION CONSULT NOTE - Follow Up Consult  Pharmacy Consult for Heparin, Coumadin Indication: history of recurrent DVTs  No Known Allergies  Patient Measurements: Height: 5\' 8"  (172.7 cm) Weight: 141 lb 8 oz (64.184 kg) IBW/kg (Calculated) : 63.9   Vital Signs: Temp: 98.3 F (36.8 C) (01/11 0124) Temp src: Oral (01/11 0124) BP: 130/79 mmHg (01/11 0600) Pulse Rate: 92  (01/11 0600)  Labs:  Basename 12/06/12 0726 12/05/12 1953 12/05/12 1856  HGB 8.9* 10.3* --  HCT 29.0* 32.4* --  PLT 340 402* --  APTT -- -- --  LABPROT 15.6* -- 17.8*  INR 1.27 -- 1.51*  HEPARINUNFRC <0.10* -- --  CREATININE -- 0.78 --  CKTOTAL -- -- --  CKMB -- -- --  TROPONINI -- -- --    Estimated Creatinine Clearance: 96.2 ml/min (by C-G formula based on Cr of 0.78).   Medications:  Infusions:    . heparin 850 Units/hr (12/06/12 0029)    Assessment: 37 year old female admitted with a subacute CVA.  She is on chronic anticoagulation with Coumadin for history of DVT and her INR was subtherapeutic on admission due to several missed doses of Coumadin.  She is being bridged with Heparin (ok per Neuro) at an adjusted therapeutic range due to her recent CVA.  Her initial heparin level is subtherapeutic. Her INR remains subtherapeutic as would be expected after missing 4 days of Coumadin.  Will continue with her home dosage.  Goal of Therapy:  Heparin level 0.3-0.5 units/ml Monitor platelets by anticoagulation protocol: Yes   Plan:  Increase Heparin to 1050 units/hr Recheck Heparin level in 6 hours Coumadin 7.5mg  PO x 1 today Daily PT/INR monitoring  Estella Husk, Pharm.D., BCPS Clinical Pharmacist  Phone [phone] Pager [phone] 12/06/2012, 8:46 AM

## 2012-12-06 NOTE — H&P (Signed)
[facility] History and Physical  [name] ZOX:[id number] DOB: 04-Aug-1976 DOA: 12/05/2012  Referring physician:  PCP: Egbert Garibaldi, NP  Specialists:   Chief Complaint: Headache and Loss of Vision  HPI: [name] is a 37 y.o. female with a history of Takayasu's Arteritis and history of recurrent DVTs who presents to the ED with complaints of 3 days of a severe  Dull headache in the back and left side of her head and trouble thinking.   She also had transient loss of vision in her left eye 1 day ago that lasted 10 minutes, and today she had blurry vision and decreased peripheral vision in her right eye.   She reports approximately 4 days ago having increased nausea and vomiting and not being able to take her prednisone or her coumadin therapy, and has not had any since.   In the ED she was found to have a subacute Left occipital infarct. And she was referred for admission.      Review of Systems: The patient denies anorexia, fever, weight loss,, vision loss, decreased hearing, hoarseness, chest pain, syncope, dyspnea on exertion, peripheral edema, balance deficits, hemoptysis, abdominal pain, melena, hematochezia, severe indigestion/heartburn, hematuria, incontinence, genital sores, muscle weakness, suspicious skin lesions, transient blindness, difficulty walking, depression, unusual weight change, abnormal bleeding, enlarged lymph nodes, angioedema, and breast masses.    Past Medical History  Diagnosis Date  . PVD (peripheral vascular disease)   . Benign tumor of back   . Swelling, lymph nodes 2006    intermittant, benign  . DVT (deep venous thrombosis)    Past Surgical History  Procedure Date  . Ivc filter placement   . Breast surgery     bil breast implants    Medications:  HOME MEDS: Prior to Admission medications   Medication Sig Start Date End Date Taking? Authorizing Provider  HYDROcodone-acetaminophen (VICODIN) 5-500 MG per tablet Take 1-2 tablets by  mouth every 6 (six) hours as needed. For pain 11/03/12  Yes Hannah Muthersbaugh, PA-C  pred niSONE (DELTASONE) 20 MG tablet Take 40 mg by mouth daily. 11/03/12  Yes Hannah Muthersbaugh, PA-C  traMADol (ULTRAM) 50 MG tablet Take 50 mg by mouth every 6 (six) hours as needed. For pain   Yes Historical Provider, MD  warfarin (COUMADIN) 7.5 MG tablet Take 7.5 mg by mouth daily.   Yes Historical Provider, MD    Allergies:  No Known Allergies  Social History:   reports that she has never smoked. She has never used smokeless tobacco. She reports that she drinks alcohol. She reports that she does not use illicit drugs.  Family History: Family History  Problem Relation Age of Onset  . Cancer - Other Father   . Coronary artery disease Mother   . Stroke Mother   . Hypertension Mother   . Hypertension Sister      Physical Exam:  GEN:  Pleasant 37 year old well nourished and well developed African American Femaleexamined  and in no acute distress; cooperative with exam Filed Vitals:   12/06/12 0124 12/06/12 0324 12/06/12 0400 12/06/12 0600  BP: 127/73 133/75 122/74 130/79  Pulse: 88 87 91 92  Temp: 98.3 F (36.8 C)     TempSrc: Oral     Resp: 16 16 20 20   Height: 5\' 8"  (1.727 m)     Weight: 64.411 kg (142 lb) 64.184 kg (141 lb 8 oz)    SpO2: 99% 99%     Blood pressure 130/79, pulse 92,  temperature 98.3 F (36.8 C), temperature source Oral, resp. rate 20, height 5\' 8"  (1.727 m), weight 64.184 kg (141 lb 8 oz), last menstrual period 11/04/2012, SpO2 99.00%. PSYCH: SHe is alert and oriented x4; does not appear anxious does not appear depressed; affect is normal HEENT: Normocephalic and Atraumatic, Mucous membranes pink; PERRLA; EOM intact; Fundi:  Benign;  No scleral icterus, Nares: Patent, Oropharynx: Clear, Edentulous or Fair Dentition, Neck:  FROM, no cervical lymphadenopathy nor thyromegaly or carotid bruit; no JVD; Breasts:: Not examined CHEST WALL: No tenderness CHEST: Normal  respiration, clear to auscultation bilaterally HEART: Regular rate and rhythm; no murmurs rubs or gallops BACK: No kyphosis or scoliosis; no CVA tenderness ABDOMEN: Positive Bowel Sounds, Scaphoid, Obese, soft non-tender; no masses, no organomegaly, no pannus; no intertriginous candida. Rectal Exam: Not done EXTREMITIES: No bone or joint deformity; age-appropriate arthropathy of the hands and knees; no cyanosis, clubbing or edema; no ulcerations. Genitalia: not examined PULSES: 2+ and symmetric SKIN: Normal hydration no rash or ulceration CNS: Cranial nerves 2-12 grossly intact no focal neurologic deficit except for decrease peripheral vision in Right Eye, Otherwise, EOMI, and PERRL and fundi Benign,   No pronator drift, Grips 5/5 and symetric,  No ataxia, Cerebellar function intact, DTRs 2/4 and symmetric. Gait was not assessed.         Labs on Admission:  Basic Metabolic Panel:  Lab 12/05/12 1610  NA 140  K 3.5  CL 102  CO2 28  GLUCOSE 108*  BUN 6  CREATININE 0.78  CALCIUM 9.2  MG --  PHOS --   Liver Function Tests: No results found for this basename: AST:5,ALT:5,ALKPHOS:5,BILITOT:5,PROT:5,ALBUMIN:5 in the last 168 hours No results found for this basename: LIPASE:5,AMYLASE:5 in the last 168 hours No results found for this basename: AMMONIA:5 in the last 168 hours CBC:  Lab 12/05/12 1953  WBC 14.7*  NEUTROABS 12.3*  HGB 10.3*  HCT 32.4*  MCV 78.5  PLT 402*   Cardiac Enzymes: No results found for this basename: CKTOTAL:5,CKMB:5,CKMBINDEX:5,TROPONINI:5 in the last 168 hours  BNP (last 3 results) No results found for this basename: PROBNP:3 in the last 8760 hours CBG: No results found for this basename: GLUCAP:5 in the last 168 hours  Radiological Exams on Admission: Dg Chest 2 View  12/05/2012  *RADIOLOGY REPORT*  Clinical Data: Shortness of breath, fever, nausea, vomiting.  CHEST - 2 VIEW  Comparison: 11/03/2012  Findings: Heart is normal size.  Prominence of the  proximal descending thoracic aorta.  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  No change in the appearance since prior study.  Lungs are clear.  No effusions.  No acute bony abnormality.  IMPRESSION: No acute cardiopulmonary disease.   Original Report Authenticated By: Charlett Nose, M.D.    Ct Head Wo Contrast  12/05/2012  *RADIOLOGY REPORT*  Clinical Data: Headache.  History of Takayasu's arteritis.  CT HEAD WITHOUT CONTRAST  Technique:  Contiguous axial images were obtained from the base of the skull through the vertex without contrast.  Comparison: Brain MRI 12/12/2010.  Findings: In the medial aspect of the left occipital lobe there is a relatively well-defined area of low attenuation, concerning for late subacute cerebral ischemia.  No evidence of hemorrhagic transformation.  No acute intracerebral hemorrhage.  No definite mass, mass effect, hydrocephalus or abnormal intra or extra-axial fluid collections.  Visualized paranasal sinuses and mastoids are well pneumatized.  No acute displaced skull fractures  are identified.  IMPRESSION: 1.  Findings, as above, concerning for a late subacute ischemia in the medial aspect of the left occipital lobe.  This could be confirmed with contrast enhanced brain MRI if clinically indicated.  These results were called by telephone on 12/05/2012 at 07:42 p.m. to Dr. Laveda Norman, who verbally acknowledged these results.   Original Report Authenticated By: Trudie Reed, M.D.       Assessment: Principal Problem:  *CVA (cerebral infarction) Active Problems:  TAKAYASU'S ARTERITIS  THROMBOSIS, VENOUS  Subtherapeutic international normalized ratio (INR)    Plan:      Admit to telemetry Bed CVA Protocol, MRI, Carotid US, 2 D-ECHO ordered, Neuro checks, ASA rx IV Heparin for coumadin bridgiing due to subtherapeutic INR, Pharmacy adjustment and management of  coumadin Reconcile Home Medications Neuro saw in ED.       Code Status:  FULL CODE Family Communication:   Mother at Bedside Disposition Plan:  Return to Home on Discharge  Time spent: 74 Minutes  Ron Parker [facility] Pager [phone]  If 7PM-7AM, please contact night-coverage [email or web address] Password [facility] 12/06/2012, 7:22 AM

## 2012-12-06 NOTE — Evaluation (Signed)
Clinical/Bedside Swallow Evaluation Patient Details  Name: [name] MRN: [id number] Date of Birth: 1976/06/08  Today's Date: 12/06/2012 Time: 4098-1191 SLP Time Calculation (min): 23 min  Past Medical History:  Past Medical History  Diagnosis Date  . PVD (peripheral vascular disease)   . Benign tumor of back   . Swelling, lymph nodes 2006    intermittant, benign  . DVT (deep venous thrombosis)    Past Surgical History:  Past Surgical History  Procedure Date  . Ivc filter placement   . Breast surgery     bil breast implants   HPI:  [name] is a 37 y.o. female with a history of Takayasu's Arteritis and history of recurrent DVTs who presents to the ED with complaints of 3 days of a severe  Dull headache in the back and left side of her head and trouble thinking.   She also had transient loss of vision in her left eye 1 day ago that lasted 10 minutes, and today she had blurry vision and decreased peripheral vision in her right eye.   She reports approximately 4 days ago having increased nausea and vomiting and not being able to take her prednisone or her coumadin therapy, and has not had any since.   In the ED she was found to have a subacute Left occipital infarct. And she was referred for admission.  Patient referred for BSE per stroke protocol.   Assessment / Plan / Recommendation Clinical Impression  Oropharyngeal swallow WFL for regular consistency and thin liquids.  No s/s of aspiration noted throughout evaluation.  ST to sign off as no dysphagia indicated and education completed.       Diet Recommendation Regular   Liquid Administration via: Cup;Straw Medication Administration: Whole meds with liquid Supervision: Patient able to self feed    Other  Recommendations Oral Care Recommendations: Oral care BID   Follow Up Recommendations  None                Swallow Study Prior Functional Status  Type of Home: House Lives With: Alone Available Help at  Discharge: Available 24 hours/day (pts son going back to college, mom can assist prn)    General Date of Onset: 12/05/12 HPI: [name] is a 37 y.o. female with a history of Takayasu's Arteritis and history of recurrent DVTs who presents to the ED with complaints of 3 days of a severe  Dull headache in the back and left side of her head and trouble thinking.   She also had transient loss of vision in her left eye 1 day ago that lasted 10 minutes, and today she had blurry vision and decreased peripheral vision in her right eye.   She reports approximately 4 days ago having increased nausea and vomiting and not being able to take her prednisone or her coumadin therapy, and has not had any since.   In the ED she was found to have a subacute Left occipital infarct. And she was referred for admission.   Type of Study: Bedside swallow evaluation Diet Prior to this Study: NPO Temperature Spikes Noted: No Respiratory Status: Room air History of Recent Intubation: No Behavior/Cognition: Alert;Cooperative Patient Positioning: Upright in bed Baseline Vocal Quality: Clear    Oral/Motor/Sensory Function Overall Oral Motor/Sensory Function: Appears within functional limits for tasks assessed   Ice Chips Ice chips: Within functional limits Presentation: Self Fed   Thin Liquid Thin Liquid: Within functional limits Presentation: Cup;Straw    Nectar Thick  Nectar Thick Liquid: Not tested   Honey Thick Honey Thick Liquid: Not tested   Puree Puree: Within functional limits Presentation: Self Fed   Solid   GO    Solid: Within functional limits Presentation: Self Lorretta Harp M.S, CCC-SLP [phone] [facility] 12/06/2012,9:59 AM

## 2012-12-06 NOTE — Progress Notes (Signed)
VASCULAR LAB PRELIMINARY  PRELIMINARY  PRELIMINARY  PRELIMINARY  Carotid Doppler completed.    Preliminary report:  There is no significant ICA stenosis.  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  The left vertebral artery appears occluded. Right vertebral flow is antegrade.  Myrikal Messmer, RVT 12/06/2012, 10:58 AM

## 2012-12-07 LAB — CBC
HCT: 28.3 % — ABNORMAL LOW (ref 36.0–46.0)
Hemoglobin: 9 g/dL — ABNORMAL LOW (ref 12.0–15.0)
MCH: 24.9 pg — ABNORMAL LOW (ref 26.0–34.0)
RBC: 3.62 MIL/uL — ABNORMAL LOW (ref 3.87–5.11)

## 2012-12-07 LAB — PROTIME-INR: INR: 1.66 — ABNORMAL HIGH (ref 0.00–1.49)

## 2012-12-07 MED ORDER — ENOXAPARIN SODIUM 60 MG/0.6ML ~~LOC~~ SOLN
60.0000 mg | Freq: Two times a day (BID) | SUBCUTANEOUS | Status: DC
  Administered 2012-12-07: 60 mg via SUBCUTANEOUS
  Filled 2012-12-07 (×2): qty 0.6

## 2012-12-07 MED ORDER — ENOXAPARIN SODIUM 60 MG/0.6ML ~~LOC~~ SOLN: 60.0000 mg | Freq: Two times a day (BID) | SUBCUTANEOUS | Status: DC

## 2012-12-07 MED ORDER — ZOLPIDEM TARTRATE 10 MG PO TABS: 10.0000 mg | ORAL_TABLET | Freq: Every evening | ORAL | Status: DC | PRN

## 2012-12-07 MED ORDER — WARFARIN SODIUM 7.5 MG PO TABS
7.5000 mg | ORAL_TABLET | Freq: Every day | ORAL | Status: DC
  Filled 2012-12-07: qty 1

## 2012-12-07 MED ORDER — ALPRAZOLAM 0.5 MG PO TABS: 1.0000 mg | ORAL_TABLET | Freq: Every evening | ORAL | Status: DC | PRN

## 2012-12-07 MED ORDER — ALPRAZOLAM 1 MG PO TABS: 1.0000 mg | ORAL_TABLET | Freq: Every evening | ORAL | Status: DC | PRN

## 2012-12-07 NOTE — ED Provider Notes (Signed)
Medical screening examination/treatment/procedure(s) were conducted as a shared visit with non-physician practitioner(s) and myself.  I personally evaluated the patient during the encounter Pt c/o visual changes, on and off headaches in past day. Visual changes constant for past day. No numbness/weakness. Ct/mri, admit.   Suzi Roots, MD 12/07/12 [phone]

## 2012-12-07 NOTE — Progress Notes (Signed)
Pt correctly demonstrated administering Lovenox, she has been on Lovenox multiple times in the past.

## 2012-12-07 NOTE — Progress Notes (Signed)
NCM spoke to pt and states she has drug coverage for her medications. She has administered the Lovenox injections in the past. She goes to [facility] on [address] # [phone]. She goes under [place] with insurance. Spoke to attending MD to have Rx go to pharmacy. [facility] has Lovenox in stock. Pt states she does not need any DME or HH at this time.  Isidoro Donning RN CCM Case Mgmt phone [phone]

## 2012-12-07 NOTE — Discharge Summary (Signed)
Physician Discharge Summary  [name] WUJ:[id number] DOB: October 13, 1976 DOA: 12/05/2012  PCP: Egbert Garibaldi, NP  Admit date: 12/05/2012 Discharge date: 12/07/2012  Time spent: 35 minutes  Recommendations for Outpatient Follow-up:  1. Keep appointment for echo on 1/20 with Dr. Jacinto Halim 2. No driving 3. lovenox- coumadin (has appointment 1/14 with Dr. Jason Fila)  Discharge Diagnoses:  Principal Problem:  *CVA (cerebral infarction) Active Problems:  TAKAYASU'S ARTERITIS  THROMBOSIS, VENOUS  Subtherapeutic international normalized ratio (INR)   Discharge Condition: improved  Diet recommendation: cardiac  Filed Weights   12/06/12 0124 12/06/12 0324 12/07/12 0711  Weight: 64.411 kg (142 lb) 64.184 kg (141 lb 8 oz) 65.182 kg (143 lb 11.2 oz)    History of present illness:  [name] is a 37 y.o. female with a history of Takayasu's Arteritis and history of recurrent DVTs who presents to the ED with complaints of 3 days of a severe Dull headache in the back and left side of her head and trouble thinking. She also had transient loss of vision in her left eye 1 day ago that lasted 10 minutes, and today she had blurry vision and decreased peripheral vision in her right eye. She reports approximately 4 days ago having increased nausea and vomiting and not being able to take her prednisone or her coumadin therapy, and has not had any since. In the ED she was found to have a subacute Left occipital infarct. And she was referred for admission.      Hospital Course:  CVA- lovenox to coumadin bridge, U/s ok, echo has already been arranged at dr. Jacinto Halim- patient threatening to leave today so she could see her son off to college, HgbA1C: 6.2, FLP: LDL 56.- was not taking coumadin secondary to N/V- spoke a length with patient that next time she is unable to take coumadin, she needs to see her PCP or come to the ER Takayasu Arteritis on prednisone- sees Rheumatology on Monday 1/13 Insomnia- Remus Loffler  given as patient was out Headache resolved    Consultations: neuro  Discharge Exam: Filed Vitals:   12/06/12 1500 12/06/12 2100 12/07/12 0026 12/07/12 0711  BP: 118/73 111/72 114/76 117/65  Pulse: 81 85 104 98  Temp: 98.1 F (36.7 C) 98.7 F (37.1 C)  98.8 F (37.1 C)  TempSrc:  Oral  Oral  Resp: 18 18 18    Height:      Weight:    65.182 kg (143 lb 11.2 oz)  SpO2: 100% 98% 98% 100%    General: a+Ox3, NAD Cardiovascular: rrr Respiratory: clear  Discharge Instructions      Discharge Orders    Future Orders Please Complete By Expires   Diet general      Comments:   coumadin   Increase activity slowly      Discharge instructions      Comments:   Keep appointment for echo on 1/20 with Dr. Jacinto Halim PT/INr on TuesdayJudie Grieve bray CBC 1 week re Hgb   Driving Restrictions      Comments:   No driving until seen and cleared by PCP       Medication List     As of 12/07/2012  5:23 PM    TAKE these medications         ALPRAZolam 1 MG tablet   Commonly known as: XANAX   Take 1 tablet (1 mg total) by mouth at bedtime as needed for anxiety.      enoxaparin 60 MG/0.6ML injection   Commonly  known as: LOVENOX   Inject 0.6 mLs (60 mg total) into the skin every 12 (twelve) hours.      HYDROcodone-acetaminophen 5-500 MG per tablet   Commonly known as: VICODIN   Take 1-2 tablets by mouth every 6 (six) hours as needed. For pain      predniSONE 20 MG tablet   Commonly known as: DELTASONE   Take 40 mg by mouth daily.      traMADol 50 MG tablet   Commonly known as: ULTRAM   Take 50 mg by mouth every 6 (six) hours as needed. For pain      warfarin 7.5 MG tablet   Commonly known as: COUMADIN   Take 7.5 mg by mouth daily.      zolpidem 10 MG tablet   Commonly known as: AMBIEN   Take 1 tablet (10 mg total) by mouth at bedtime as needed for sleep.        Follow-up Information    Follow up with Millsaps, Joelene Millin, NP. In 1 week.   Contact information:   [facility] [address] [place] [address] [phone]       Follow up with Loletta Parish. (on Tuesday)       Follow up with Gates Rigg, MD. Schedule an appointment as soon as possible for a visit in 2 months.   Contact information:   [address], [address] [facility] [place] [address] [phone]           The results of significant diagnostics from this hospitalization (including imaging, microbiology, ancillary and laboratory) are listed below for reference.    Significant Diagnostic Studies: Dg Chest 2 View  12/05/2012  *RADIOLOGY REPORT*  Clinical Data: Shortness of breath, fever, nausea, vomiting.  CHEST - 2 VIEW  Comparison: 11/03/2012  Findings: Heart is normal size.  Prominence of the proximal descending thoracic aorta.  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  No change in the appearance since prior study.  Lungs are clear.  No effusions.  No acute bony abnormality.  IMPRESSION: No acute cardiopulmonary disease.   Original Report Authenticated By: Charlett Nose, M.D.    Ct Head Wo Contrast  12/05/2012  *RADIOLOGY REPORT*  Clinical Data: Headache.  History of Takayasu's arteritis.  CT HEAD WITHOUT CONTRAST  Technique:  Contiguous axial images were obtained from the base of the skull through the vertex without contrast.  Comparison: Brain MRI 12/12/2010.  Findings: In the medial aspect of the left occipital lobe there is a relatively well-defined area of low attenuation, concerning for late subacute cerebral ischemia.  No evidence of hemorrhagic transformation.  No acute intracerebral hemorrhage.  No definite mass, mass effect, hydrocephalus or abnormal intra or extra-axial fluid collections.  Visualized paranasal sinuses and mastoids are well pneumatized.  No acute displaced skull fractures are identified.  IMPRESSION: 1.   Findings, as above, concerning for a late subacute ischemia in the medial aspect of the left occipital lobe.  This could be confirmed with contrast enhanced brain MRI if clinically indicated.  These results were called by telephone on 12/05/2012 at 07:42 p.m. to Dr. Laveda Norman, who verbally acknowledged these results.   Original Report Authenticated By: Trudie Reed, M.D.    Mr Brain Wo Contrast  12/06/2012  *RADIOLOGY REPORT*  Clinical Data:  37 year old female with history of Takayasu's arteritis.  Headache, nausea, vomiting.  Visual disturbance, evidence  of left PCA infarct on CT.  Comparison: Head CT 12/05/2012.  Brain MRI and MRA 12/12/2010.  MRI HEAD WITHOUT CONTRAST  Technique: Multiplanar, multiecho pulse sequences of the brain and surrounding structures were obtained according to standard protocol without intravenous contrast.  Findings: Confluent restricted diffusion in the dorsal left thalamus and medial left occipital lobe, PCA territory.  No definite brain stem or cerebellar involvement.  Associated cytotoxic edema in the affected parenchyma.  No evidence of associated hemorrhage.  No significant mass effect.  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  Chronic diminutive distal left vertebral artery.  MRA findings are below.  Outside of the left PCA territory, gray and white matter signal is within normal limits for age.  Normal cerebral volume.  No ventriculomegaly.  No midline shift.  Negative pituitary, cervicomedullary junction and visualized cervical spine. Visualized bone marrow signal is within normal limits.  Visualized orbit soft tissues are within normal limits.  Visualized paranasal sinuses and mastoids are clear.  Negative scalp soft tissues.  IMPRESSION: 1.  Confluent acute left PCA infarct affecting the dorsal left thalamus and medial left occipital lobe.  Edema without significant mass  effect or evidence of associated hemorrhage. 2.  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 Chronic diminutive left vertebral artery.  See MRA findings below.  MRA HEAD WITHOUT CONTRAST  Technique: Angiographic images of the Circle of Willis were obtained using MRA technique without  intravenous contrast.  Findings: Stable antegrade flow in the distal right vertebral artery.  Right PICA origin is stable.  Incidental linear flow signal re-identified through the right glossopharyngeal canal.  The right vertebral artery primarily supplies the basilar.  There is some flow signal in a diminutive distal left vertebral artery and the left PICA, stable.  The basilar artery is somewhat gracile but stable without focal stenosis.  Dominant right AICA again noted.  SCA origins and basilar tip are patent.  Fetal type PCA origins, more so the left.  The superior division of the left PCA is occluded.  Previously this was patent.  The left P1 and T2 segments are minimally irregular compared the prior.  Right PCA branches appears stable and within normal limits.  Stable antegrade flow in the distal cervical right ICA and right ICA siphon.  No flow signal detected in the distal cervical left ICA, or in the left ICA siphon proximal to the cavernous segment. Scant reconstituted flow in the cavernous segment and anterior genu.  More robust reconstitution of the supraclinoid left ICA from the left posterior communicating artery as before.  No right ICA stenosis.  Stable and fairly symmetric appearing flow at both carotid termini. MCA and ACA origins are stable.  The anterior communicating artery is ectatic and gives rise to a medium artery of the corpus callosum.  2-3 mm vessel diameter in this region appears related to ectasia and infundibulum rather than discrete saccular aneurysm.  This finding is stable.  Other visualized ACA branches are stable and within normal limits. Visualized bilateral MCA branches are stable and  within normal limits.  IMPRESSION: 1.  Newly occluded left PCA superior division. Minimal irregularity left P1 and P2 segment. 2.  Otherwise stable posterior circulation including chronically diminutive distal left vertebral artery and fetal type PCA origins. 3.  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 4.  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  No discrete aneurysm.  Attention directed on follow-up.   Original Report Authenticated By: Erskine Speed, M.D.    Mr Mra Head/brain Wo Cm  12/06/2012  *RADIOLOGY REPORT*  Clinical Data:  37 year old female with history of Takayasu's arteritis.  Headache, nausea, vomiting.  Visual disturbance, evidence of left PCA infarct on CT.  Comparison: Head CT 12/05/2012.  Brain MRI and MRA 12/12/2010.  MRI HEAD WITHOUT CONTRAST  Technique: Multiplanar, multiecho pulse sequences of the brain and surrounding structures were obtained according to standard protocol without intravenous contrast.  Findings: Confluent restricted diffusion in the dorsal left thalamus and medial left occipital lobe, PCA territory.  No definite brain stem or cerebellar involvement.  Associated cytotoxic edema in the affected parenchyma.  No evidence of associated hemorrhage.  No significant mass effect.  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  Chronic diminutive distal left vertebral artery.  MRA findings are below.  Outside of the left PCA territory, gray and white matter signal is within normal limits for age.  Normal cerebral volume.  No ventriculomegaly.  No midline shift.  Negative pituitary, cervicomedullary junction and visualized cervical spine. Visualized bone marrow signal is within normal limits.   Visualized orbit soft tissues are within normal limits.  Visualized paranasal sinuses and mastoids are clear.  Negative scalp soft tissues.  IMPRESSION: 1.  Confluent acute left PCA infarct affecting the dorsal left thalamus and medial left occipital lobe.  Edema without significant mass effect or evidence of associated hemorrhage. 2.  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 Chronic diminutive left vertebral artery.  See MRA findings below.  MRA HEAD WITHOUT CONTRAST  Technique: Angiographic images of the Circle of Willis were obtained using MRA technique without  intravenous contrast.  Findings: Stable antegrade flow in the distal right vertebral artery.  Right PICA origin is stable.  Incidental linear flow signal re-identified through the right glossopharyngeal canal.  The right vertebral artery primarily supplies the basilar.  There is some flow signal in a diminutive distal left vertebral artery and the left PICA, stable.  The basilar artery is somewhat gracile but stable without focal stenosis.  Dominant right AICA again noted.  SCA origins and basilar tip are patent.  Fetal type PCA origins, more so the left.  The superior division of the left PCA is occluded.  Previously this was patent.  The left P1 and T2 segments are minimally irregular compared the prior.  Right PCA branches appears stable and within normal limits.  Stable antegrade flow in the distal cervical right ICA and right ICA siphon.  No flow signal detected in the distal cervical left ICA, or in the left ICA siphon proximal to the cavernous segment. Scant reconstituted flow in the cavernous segment and anterior genu.  More robust reconstitution of the supraclinoid left ICA from the left posterior communicating artery as before.  No right ICA stenosis.  Stable and fairly symmetric appearing flow at both carotid termini. MCA and ACA origins are stable.  The anterior communicating artery is ectatic and gives rise to a medium artery  of the corpus callosum.  2-3 mm vessel diameter in this region appears related to ectasia and infundibulum rather than discrete saccular aneurysm.  This finding is stable.  Other visualized ACA branches are stable and within normal limits. Visualized  bilateral MCA branches are stable and within normal limits.  IMPRESSION: 1.  Newly occluded left PCA superior division. Minimal irregularity left P1 and P2 segment. 2.  Otherwise stable posterior circulation including chronically diminutive distal left vertebral artery and fetal type PCA origins. 3.  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 4.  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  No discrete aneurysm.  Attention directed on follow-up.   Original Report Authenticated By: Erskine Speed, M.D.     Microbiology: No results found for this or any previous visit (from the past 240 hour(s)).   Labs: Basic Metabolic Panel:  Lab 12/05/12 4098  NA 140  K 3.5  CL 102  CO2 28  GLUCOSE 108*  BUN 6  CREATININE 0.78  CALCIUM 9.2  MG --  PHOS --   Liver Function Tests: No results found for this basename: AST:5,ALT:5,ALKPHOS:5,BILITOT:5,PROT:5,ALBUMIN:5 in the last 168 hours No results found for this basename: LIPASE:5,AMYLASE:5 in the last 168 hours No results found for this basename: AMMONIA:5 in the last 168 hours CBC:  Lab 12/07/12 0458 12/06/12 0726 12/05/12 1953  WBC 13.0* 12.8* 14.7*  NEUTROABS -- -- 12.3*  HGB 9.0* 8.9* 10.3*  HCT 28.3* 29.0* 32.4*  MCV 78.2 79.7 78.5  PLT 389 340 402*   Cardiac Enzymes: No results found for this basename: CKTOTAL:5,CKMB:5,CKMBINDEX:5,TROPONINI:5 in the last 168 hours BNP: BNP (last 3 results) No results found for this basename: PROBNP:3 in the last 8760 hours CBG: No results found for this basename:  GLUCAP:5 in the last 168 hours     Signed:  Marlin Canary  [facility] 12/07/2012, 5:23 PM

## 2012-12-07 NOTE — Progress Notes (Signed)
ANTICOAGULATION CONSULT NOTE - Follow Up Consult  Pharmacy Consult for Lovenox, Coumadin Indication: history of recurrent DVTs  No Known Allergies  Patient Measurements: Height: 5\' 8"  (172.7 cm) Weight: 143 lb 11.2 oz (65.182 kg) IBW/kg (Calculated) : 63.9   Vital Signs: Temp: 98.8 F (37.1 C) (01/12 0711) Temp src: Oral (01/12 0711) BP: 117/65 mmHg (01/12 0711) Pulse Rate: 98  (01/12 0711)  Labs:  Basename 12/07/12 0458 12/06/12 1654 12/06/12 0726 12/05/12 1953 12/05/12 1856  HGB 9.0* -- 8.9* -- --  HCT 28.3* -- 29.0* 32.4* --  PLT 389 -- 340 402* --  APTT -- -- -- -- --  LABPROT 19.1* -- 15.6* -- 17.8*  INR 1.66* -- 1.27 -- 1.51*  HEPARINUNFRC 0.21* 0.23* <0.10* -- --  CREATININE -- -- -- 0.78 --  CKTOTAL -- -- -- -- --  CKMB -- -- -- -- --  TROPONINI -- -- -- -- --    Estimated Creatinine Clearance: 96.2 ml/min (by C-G formula based on Cr of 0.78).   Medications:  Infusions:     . [DISCONTINUED] heparin 1,100 Units/hr (12/06/12 2151)    Assessment: 37 year old female admitted with a subacute CVA.  She is on chronic anticoagulation with Coumadin for history of DVT and her INR was subtherapeutic on admission due to several missed doses of Coumadin.  She is being bridged with Heparin (ok per Neuro) at an adjusted therapeutic range due to her recent CVA.  Request received per Dr. Benjamine Mola to convert her to Lovenox for discharge.  Her INR has responded well to 2 doses of Coumadin.  Per nurse patient's headache is much better than this am.    Goal of Therapy:  Monitor platelets by anticoagulation protocol: Yes INR 2-3   Plan:  Discontinue Heparin.   Lovenox 60mg  SQ q12h (~1mg /kg q12h).  May give 1st Lovenox dose 1 hour after heparin is stopped. Coumadin 7.5mg  daily If she is discharged, recommend restarting her home Coumadin dose of 7.5mg  daily and checking her INR on Tuesday 1/14.  Estella Husk, Pharm.D., BCPS Clinical Pharmacist  Phone [phone] Pager  [phone] 12/07/2012, 9:50 AM

## 2012-12-08 LAB — BETA-2-GLYCOPROTEIN I ABS, IGG/M/A
Beta-2-Glycoprotein I IgA: 15 A Units (ref <20)
Beta-2-Glycoprotein I IgM: 7 M Units (ref <20)

## 2012-12-08 LAB — LUPUS ANTICOAGULANT PANEL
PTT Lupus Anticoagulant: 58.2 secs — ABNORMAL HIGH (ref 28.0–43.0)
PTTLA 4:1 Mix: 56.5 secs — ABNORMAL HIGH (ref 28.0–43.0)
PTTLA Confirmation: 20.6 secs — ABNORMAL HIGH (ref <8.0)

## 2012-12-08 LAB — CARDIOLIPIN ANTIBODIES, IGG, IGM, IGA: Anticardiolipin IgG: 7 GPL U/mL — ABNORMAL LOW (ref <23)

## 2012-12-08 LAB — PROTEIN S ACTIVITY: Protein S Activity: 82 % (ref 69–129)

## 2012-12-09 LAB — FACTOR 5 LEIDEN

## 2012-12-09 LAB — PROTEIN C, TOTAL: Protein C, Total: 70 % — ABNORMAL LOW (ref 72–160)

## 2012-12-09 LAB — PROTEIN S, TOTAL: Protein S Ag, Total: 70 % (ref 60–150)

## 2012-12-11 ENCOUNTER — Other Ambulatory Visit: Payer: Self-pay

## 2012-12-11 ENCOUNTER — Emergency Department (HOSPITAL_COMMUNITY): Payer: Medicare Other

## 2012-12-11 ENCOUNTER — Emergency Department (HOSPITAL_COMMUNITY)
Admission: EM | Admit: 2012-12-11 | Discharge: 2012-12-11 | Disposition: A | Discharge status: Home Health | Payer: Medicare Other | Attending: Emergency Medicine | Admitting: Emergency Medicine

## 2012-12-11 ENCOUNTER — Encounter (HOSPITAL_COMMUNITY): Payer: Self-pay | Admitting: *Deleted

## 2012-12-11 DIAGNOSIS — Z8739 Personal history of other diseases of the musculoskeletal system and connective tissue: Secondary | ICD-10-CM | POA: Insufficient documentation

## 2012-12-11 DIAGNOSIS — R51 Headache: Secondary | ICD-10-CM | POA: Insufficient documentation

## 2012-12-11 DIAGNOSIS — H53149 Visual discomfort, unspecified: Secondary | ICD-10-CM | POA: Insufficient documentation

## 2012-12-11 DIAGNOSIS — Z8679 Personal history of other diseases of the circulatory system: Secondary | ICD-10-CM | POA: Insufficient documentation

## 2012-12-11 DIAGNOSIS — Z7901 Long term (current) use of anticoagulants: Secondary | ICD-10-CM | POA: Insufficient documentation

## 2012-12-11 DIAGNOSIS — IMO0002 Reserved for concepts with insufficient information to code with codable children: Secondary | ICD-10-CM | POA: Insufficient documentation

## 2012-12-11 DIAGNOSIS — Z79899 Other long term (current) drug therapy: Secondary | ICD-10-CM | POA: Insufficient documentation

## 2012-12-11 DIAGNOSIS — Z86718 Personal history of other venous thrombosis and embolism: Secondary | ICD-10-CM | POA: Insufficient documentation

## 2012-12-11 DIAGNOSIS — Z8673 Personal history of transient ischemic attack (TIA), and cerebral infarction without residual deficits: Secondary | ICD-10-CM | POA: Insufficient documentation

## 2012-12-11 HISTORY — DX: Cerebral infarction, unspecified: I63.9

## 2012-12-11 LAB — CBC WITH DIFFERENTIAL/PLATELET
Basophils Relative: 0 % (ref 0–1)
Eosinophils Absolute: 0.1 10*3/uL (ref 0.0–0.7)
Eosinophils Relative: 0 % (ref 0–5)
Hemoglobin: 11.3 g/dL — ABNORMAL LOW (ref 12.0–15.0)
MCH: 24.9 pg — ABNORMAL LOW (ref 26.0–34.0)
MCHC: 31.9 g/dL (ref 30.0–36.0)
MCV: 78.1 fL (ref 78.0–100.0)
Monocytes Absolute: 0.6 10*3/uL (ref 0.1–1.0)
Monocytes Relative: 5 % (ref 3–12)
Neutrophils Relative %: 78 % — ABNORMAL HIGH (ref 43–77)

## 2012-12-11 LAB — POCT I-STAT, CHEM 8
BUN: 5 mg/dL — ABNORMAL LOW (ref 6–23)
Calcium, Ion: 1.21 mmol/L (ref 1.12–1.23)
Chloride: 104 mEq/L (ref 96–112)
Creatinine, Ser: 0.8 mg/dL (ref 0.50–1.10)
Glucose, Bld: 97 mg/dL (ref 70–99)
TCO2: 26 mmol/L (ref 0–100)

## 2012-12-11 LAB — BASIC METABOLIC PANEL
BUN: 8 mg/dL (ref 6–23)
CO2: 25 mEq/L (ref 19–32)
Chloride: 98 mEq/L (ref 96–112)
Creatinine, Ser: 0.73 mg/dL (ref 0.50–1.10)
Glucose, Bld: 94 mg/dL (ref 70–99)
Potassium: 3.2 mEq/L — ABNORMAL LOW (ref 3.5–5.1)

## 2012-12-11 LAB — PROTIME-INR: Prothrombin Time: 34.2 seconds — ABNORMAL HIGH (ref 11.6–15.2)

## 2012-12-11 MED ORDER — WARFARIN SODIUM 7.5 MG PO TABS: 7.5000 mg | ORAL_TABLET | Freq: Every day | ORAL | Status: DC

## 2012-12-11 MED ORDER — SODIUM CHLORIDE 0.9 % IV BOLUS (SEPSIS): 1000.0000 mL | Freq: Once | INTRAVENOUS | Status: DC

## 2012-12-11 MED ORDER — DIPHENHYDRAMINE HCL 50 MG/ML IJ SOLN: 25.0000 mg | Freq: Once | INTRAMUSCULAR | Status: DC

## 2012-12-11 MED ORDER — DEXAMETHASONE SODIUM PHOSPHATE 10 MG/ML IJ SOLN: 10.0000 mg | Freq: Once | INTRAMUSCULAR | Status: DC

## 2012-12-11 MED ORDER — OXYCODONE-ACETAMINOPHEN 5-325 MG PO TABS
1.0000 | ORAL_TABLET | Freq: Once | ORAL | Status: DC
  Filled 2012-12-11: qty 1

## 2012-12-11 MED ORDER — HYDROCODONE-ACETAMINOPHEN 5-325 MG PO TABS
2.0000 | ORAL_TABLET | Freq: Once | ORAL | Status: AC
  Administered 2012-12-11: 2 via ORAL
  Filled 2012-12-11: qty 2

## 2012-12-11 MED ORDER — METOCLOPRAMIDE HCL 5 MG/ML IJ SOLN: 10.0000 mg | Freq: Once | INTRAMUSCULAR | Status: DC

## 2012-12-11 NOTE — ED Provider Notes (Signed)
History     CSN: [id number]  Arrival date & time 12/11/12  2725   First MD Initiated Contact with Patient 12/11/12 2003      Chief Complaint  Patient presents with  . 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 States headache feels like prior. Was admitted about one week ago. Discharged few days ago. Has subacute left occipital infarct at that time. This headache came on gradually last night. Intermittently improved with home tramadol. No visual changes. +photophobia. Is on her coumadin for h/o DVTs. Just finished lovenox bridge.  Patient is a 37 y.o. female presenting with headaches. The history is provided by the patient.  Headache  This is a new problem. The current episode started yesterday. The problem occurs constantly. The problem has not changed since onset.The headache is associated with nothing. The pain is located in the left unilateral region. The quality of the pain is described as throbbing and dull. The pain is moderate. The pain does not radiate. Pertinent negatives include no fever, no chest pressure, no near-syncope, no palpitations, no shortness of breath, no nausea and no vomiting. Treatments tried: tramadol. The treatment provided moderate relief.    Past Medical History  Diagnosis Date  . PVD (peripheral vascular disease)   . Benign tumor of back   . Swelling, lymph nodes 2006    intermittant, benign  . DVT (deep venous thrombosis)   . Stroke     last week here    Past Surgical History  Procedure Date  . Ivc filter placement   . Breast surgery     bil breast implants    Family History  Problem Relation Age of Onset  . Cancer - Other Father   . Coronary artery disease Mother   . Stroke Mother   . Hypertension Mother   . Hypertension Sister     History  Substance Use Topics    . Smoking status: Never Smoker   . Smokeless tobacco: Never Used  . Alcohol Use: Yes    OB History    Grav Para Term Preterm Abortions TAB SAB Ect Mult Living                  Review of Systems  Constitutional: Negative for fever and chills.  HENT: Negative for congestion, rhinorrhea, neck pain and neck stiffness.   Eyes: Positive for photophobia. Negative for pain and visual disturbance.  Respiratory: Negative for shortness of breath and wheezing.   Cardiovascular: Positive for chest pain (moderate right sided chest pain today. focal. hurts to push on it.). Negative for palpitations, leg swelling and near-syncope.  Gastrointestinal: Negative for nausea, vomiting, diarrhea and constipation.  Genitourinary: Negative for dysuria, flank pain and difficulty urinating.  Musculoskeletal: Negative for back pain.  Skin: Negative for color change and rash.  Neurological: Positive for weakness (generalized. no focal weakness), numbness (brief numbness to digits 4&5 on RUE last night while walking outside in cold. resolved with warmth. This all occurred prior to the headache.) and headaches. Negative for dizziness, facial asymmetry and speech difficulty.    Allergies  Review of patient's allergies indicates no known allergies.  Home Medications   Current Outpatient Rx  Name  Route  Sig  Dispense  Refill  . ALPRAZOLAM 1 MG PO TABS   Oral   Take 1 tablet (1 mg total) by mouth at bedtime as  needed for anxiety.   20 tablet   0   . ENOXAPARIN SODIUM 60 MG/0.6ML [place] SOLN   Subcutaneous   Inject 0.6 mLs (60 mg total) into the skin every 12 (twelve) hours.   10 Syringe   0   . PREDNISONE 20 MG PO TABS   Oral   Take 20 mg by mouth daily.          . TRAMADOL HCL 50 MG PO TABS   Oral   Take 50 mg by mouth every 6 (six) hours as needed. For pain         . WARFARIN SODIUM 7.5 MG PO TABS   Oral   Take 7.5 mg by mouth daily.         Marland Kitchen ZOLPIDEM TARTRATE 10 MG PO TABS   Oral    Take 1 tablet (10 mg total) by mouth at bedtime as needed for sleep.   20 tablet   0   . WARFARIN SODIUM 7.5 MG PO TABS   Oral   Take 1 tablet (7.5 mg total) by mouth daily.   10 tablet   0     BP 146/91  Pulse 114  Temp 98.9 F (37.2 C) (Oral)  Resp 16  SpO2 98%  LMP 11/04/2012  Physical Exam  Constitutional: She is oriented to person, place, and time. She appears well-developed and well-nourished. No distress.  HENT:  Head: Normocephalic and atraumatic.  Right Ear: External ear normal.  Left Ear: External ear normal.  Mouth/Throat: Oropharynx is clear and moist.  Eyes: Conjunctivae normal and EOM are normal. Pupils are equal, round, and reactive to light. Right eye exhibits no discharge. Left eye exhibits no discharge.  Neck: Normal range of motion. Neck supple. No tracheal deviation present.  Cardiovascular: Normal rate, regular rhythm, normal heart sounds and intact distal pulses.   Pulmonary/Chest: Breath sounds normal. No stridor. No respiratory distress. She has no wheezes. She has no rales. She exhibits tenderness (right chest wall. focal).  Abdominal: Soft. She exhibits no distension. There is no tenderness. There is no guarding.  Musculoskeletal: She exhibits no edema and no tenderness.  Neurological: She is alert and oriented to person, place, and time. She has normal strength. No cranial nerve deficit or sensory deficit. She displays a negative Romberg sign. Coordination and gait normal. GCS eye subscore is 4. GCS verbal subscore is 5. GCS motor subscore is 6.  Skin: Skin is warm and dry.    ED Course  Procedures (including critical care time)  Labs Reviewed  CBC WITH DIFFERENTIAL - Abnormal; Notable for the following:    WBC 11.5 (*)     Hemoglobin 11.3 (*)     HCT 35.4 (*)     MCH 24.9 (*)     RDW 18.0 (*)     Platelets 538 (*)     Neutrophils Relative 78 (*)     Neutro Abs 9.0 (*)     All other components within normal limits  APTT - Abnormal; Notable  for the following:    aPTT 59 (*)     All other components within normal limits  PROTIME-INR - Abnormal; Notable for the following:    Prothrombin Time 34.2 (*)     INR 3.65 (*)     All other components within normal limits  BASIC METABOLIC PANEL - Abnormal; Notable for the following:    Potassium 3.2 (*)     All other components within normal limits  POCT I-STAT, CHEM  8 - Abnormal; Notable for the following:    Potassium 3.4 (*)     BUN 5 (*)     All other components within normal limits  POCT I-STAT TROPONIN I   Dg Chest 2 View  12/11/2012  *RADIOLOGY REPORT*  Clinical Data: Chest pain.  CHEST - 2 VIEW  Comparison: 12/05/2012  Findings: IVC filter noted. Lungs clear.  Stable contour deformity of the proximal descending thoracic aorta.   Heart size and pulmonary vascularity normal.  No effusion.  Visualized bones unremarkable.  IMPRESSION: No acute disease   Original Report Authenticated By: D. Andria Rhein, MD    Ct Head Wo Contrast  12/11/2012  *RADIOLOGY REPORT*  Clinical Data: Headache and stroke last week.  Now again complaining of headache, light sensitivity, weakness, and general fatigue.  CT HEAD WITHOUT CONTRAST  Technique:  Contiguous axial images were obtained from the base of the skull through the vertex without contrast.  Comparison: CT head 12/05/2012.  MRI brain 12/05/2012.  Findings: Low attenuation in the left posterior thalamus and medial occipital lobe consistent with area of infarct seen on previous studies.  The area of infarct is less well demarcated than on the previous study.  There is no developing mass effect or midline shift.  No abnormal extra-axial fluid collections.  No evidence of acute intracranial hemorrhage.  No effacement of ventricles or sulci.  No ventricular dilatation.  Gray-white matter junctions are distinct.  Basal cisterns are not effaced.  No depressed skull fractures.  Visualized paranasal sinuses and mastoid air cells are not opacified.  IMPRESSION:  Low attenuation changes in the left thalamus and medial occipital region consistent with known infarct.  Infarct is less distinct than on previous study.  No developing mass effect.  No acute intracranial hemorrhage.   Original Report Authenticated By: Burman Nieves, M.D.      1. Headache      Date: 12/11/2012  Rate: 113  Rhythm: sinus tachycardia  QRS Axis: normal  Intervals: normal  ST/T Wave abnormalities: normal  Conduction Disutrbances:none  Narrative Interpretation:   Old EKG Reviewed: none available    MDM    37 y/o F p/w headache. Onset yesterday. H/o recent stroke. On coumadin. Just finished lovenox bridge today. Upon arrival, HDS, af. NAD. Neuro intact on exam. Well appearing. CT head as above. Nothing acute. INR elevated. Discussed with pharmacy. Plan to hold coumadin tomorrow (patient out of coumadin at home so given 10 day prescription).  Discussed with on call neurology. As CT head negative and patient not subtherapeutic. Doubt further imaging would be of any assistance. Patient given vicodin x1 for pain as she refused IV or IM pain meds.  Patient's chest pain is very focal to right aspect of chest. Reproducible on exam. Not pleuritic. EKG as above. Doubt ACS or PE.  Headache Doubt idiopathic intracranial hypertension, cerebral venous thrombosis, temporal arteritis, skull fracture, epidural hematoma, subdural hematoma, subarachnoid hemorrhage, or other intracranial hemorrhage, concussion, trigeminal neuralgia, cluster headache, eye pathology or other emergent pathology as this is an atypical history and physical, and primary diagnosis is much more likely.     Dc home. Return precautions given. Follow up with neurology and pharmacy. Patient in agreement with plan. Fall precautions discussed.   Labs and imaging reviewed by myself and considered in medical decision making if ordered. Imaging interpreted by radiology.   Discussed case with Dr. Bebe Shaggy who is in  agreement with assessment and plan.  Stevie Kern, MD 12/11/12 [phone]

## 2012-12-11 NOTE — ED Notes (Addendum)
Pt states that she was seen for a HA last week and diagnosed with a CVA. Pt states that last week she was have eye sight problems and left sided facial pain with the HA. Pt states that last night she started having a HA same left side, pt have light sensitivity, pt denies N/V. Pt states weakness and general fatigue. Pt alert and oriented x4. Pt has been taking coumadin and took a tramadol last night for the pain but it did not get rid of the HA just covered it up. Pt discharged from here on Sunday. Pt also feels like her heart is beating differently.

## 2012-12-13 NOTE — ED Provider Notes (Signed)
I have personally seen and examined the patient.  I have discussed the plan of care with the resident.  I have reviewed the documentation on PMH/FH/Soc. History.  I have reviewed the documentation of the resident and agree.  I have reviewed and agree with the ECG interpretation(s) documented by the resident.   Pt well appearing, no distress, no focal neuro deficits on my exam.  Appears appropriate for outpatient management   Joya Gaskins, MD 12/13/12 [phone]

## 2013-02-15 ENCOUNTER — Emergency Department (HOSPITAL_COMMUNITY): Payer: Medicare Other

## 2013-02-15 ENCOUNTER — Inpatient Hospital Stay (HOSPITAL_COMMUNITY)
Admission: EM | Admit: 2013-02-15 | Discharge: 2013-02-21 | DRG: 023 | Disposition: A | Discharge status: Rehabilitation Facility | Payer: Medicare Other | Attending: Neurology | Admitting: Neurology

## 2013-02-15 ENCOUNTER — Encounter (HOSPITAL_COMMUNITY): Payer: Self-pay | Admitting: Family Medicine

## 2013-02-15 DIAGNOSIS — Z7901 Long term (current) use of anticoagulants: Secondary | ICD-10-CM

## 2013-02-15 DIAGNOSIS — D509 Iron deficiency anemia, unspecified: Secondary | ICD-10-CM | POA: Diagnosis present

## 2013-02-15 DIAGNOSIS — I10 Essential (primary) hypertension: Secondary | ICD-10-CM | POA: Diagnosis not present

## 2013-02-15 DIAGNOSIS — Z91199 Patient's noncompliance with other medical treatment and regimen due to unspecified reason: Secondary | ICD-10-CM

## 2013-02-15 DIAGNOSIS — I6529 Occlusion and stenosis of unspecified carotid artery: Secondary | ICD-10-CM | POA: Diagnosis present

## 2013-02-15 DIAGNOSIS — D72829 Elevated white blood cell count, unspecified: Secondary | ICD-10-CM | POA: Diagnosis not present

## 2013-02-15 DIAGNOSIS — E876 Hypokalemia: Secondary | ICD-10-CM | POA: Diagnosis not present

## 2013-02-15 DIAGNOSIS — J95821 Acute postprocedural respiratory failure: Secondary | ICD-10-CM | POA: Diagnosis not present

## 2013-02-15 DIAGNOSIS — G8190 Hemiplegia, unspecified affecting unspecified side: Secondary | ICD-10-CM | POA: Diagnosis present

## 2013-02-15 DIAGNOSIS — R894 Abnormal immunological findings in specimens from other organs, systems and tissues: Secondary | ICD-10-CM | POA: Diagnosis present

## 2013-02-15 DIAGNOSIS — M314 Aortic arch syndrome [Takayasu]: Secondary | ICD-10-CM

## 2013-02-15 DIAGNOSIS — F3289 Other specified depressive episodes: Secondary | ICD-10-CM | POA: Diagnosis present

## 2013-02-15 DIAGNOSIS — F329 Major depressive disorder, single episode, unspecified: Secondary | ICD-10-CM | POA: Diagnosis present

## 2013-02-15 DIAGNOSIS — M216X9 Other acquired deformities of unspecified foot: Secondary | ICD-10-CM | POA: Diagnosis present

## 2013-02-15 DIAGNOSIS — I749 Embolism and thrombosis of unspecified artery: Secondary | ICD-10-CM

## 2013-02-15 DIAGNOSIS — I6340 Cerebral infarction due to embolism of unspecified cerebral artery: Principal | ICD-10-CM | POA: Diagnosis present

## 2013-02-15 DIAGNOSIS — I639 Cerebral infarction, unspecified: Secondary | ICD-10-CM

## 2013-02-15 DIAGNOSIS — D638 Anemia in other chronic diseases classified elsewhere: Secondary | ICD-10-CM | POA: Diagnosis present

## 2013-02-15 DIAGNOSIS — Z9119 Patient's noncompliance with other medical treatment and regimen: Secondary | ICD-10-CM

## 2013-02-15 DIAGNOSIS — J9600 Acute respiratory failure, unspecified whether with hypoxia or hypercapnia: Secondary | ICD-10-CM | POA: Diagnosis not present

## 2013-02-15 DIAGNOSIS — R000 Tachycardia, unspecified: Secondary | ICD-10-CM | POA: Diagnosis not present

## 2013-02-15 DIAGNOSIS — R58 Hemorrhage, not elsewhere classified: Secondary | ICD-10-CM | POA: Diagnosis present

## 2013-02-15 DIAGNOSIS — I776 Arteritis, unspecified: Secondary | ICD-10-CM | POA: Diagnosis present

## 2013-02-15 DIAGNOSIS — IMO0002 Reserved for concepts with insufficient information to code with codable children: Secondary | ICD-10-CM

## 2013-02-15 DIAGNOSIS — M329 Systemic lupus erythematosus, unspecified: Secondary | ICD-10-CM | POA: Diagnosis present

## 2013-02-15 LAB — DIFFERENTIAL
Basophils Relative: 0 % (ref 0–1)
Eosinophils Relative: 0 % (ref 0–5)
Monocytes Absolute: 1.1 10*3/uL — ABNORMAL HIGH (ref 0.1–1.0)
Monocytes Relative: 6 % (ref 3–12)
Neutro Abs: 15.3 10*3/uL — ABNORMAL HIGH (ref 1.7–7.7)

## 2013-02-15 LAB — COMPREHENSIVE METABOLIC PANEL
Albumin: 3.2 g/dL — ABNORMAL LOW (ref 3.5–5.2)
BUN: 8 mg/dL (ref 6–23)
Calcium: 9.1 mg/dL (ref 8.4–10.5)
Chloride: 101 mEq/L (ref 96–112)
Creatinine, Ser: 0.7 mg/dL (ref 0.50–1.10)
GFR calc non Af Amer: >90 mL/min (ref >90)
Total Bilirubin: 0.2 mg/dL — ABNORMAL LOW (ref 0.3–1.2)

## 2013-02-15 LAB — POCT I-STAT, CHEM 8
BUN: 7 mg/dL (ref 6–23)
Calcium, Ion: 1.15 mmol/L (ref 1.12–1.23)
Chloride: 102 mEq/L (ref 96–112)
Creatinine, Ser: 0.8 mg/dL (ref 0.50–1.10)
Glucose, Bld: 111 mg/dL — ABNORMAL HIGH (ref 70–99)
HCT: 39 % (ref 36.0–46.0)
Hemoglobin: 13.3 g/dL (ref 12.0–15.0)
Potassium: 3.7 meq/L (ref 3.5–5.1)
Sodium: 138 mEq/L (ref 135–145)
TCO2: 30 mmol/L (ref 0–100)

## 2013-02-15 LAB — TROPONIN I: Troponin I: <0.3 ng/mL (ref <0.30)

## 2013-02-15 LAB — CBC
HCT: 36.8 % (ref 36.0–46.0)
Hemoglobin: 12 g/dL (ref 12.0–15.0)
MCH: 25.3 pg — ABNORMAL LOW (ref 26.0–34.0)
MCHC: 32.6 g/dL (ref 30.0–36.0)
MCV: 77.6 fL — ABNORMAL LOW (ref 78.0–100.0)
RDW: 17.8 % — ABNORMAL HIGH (ref 11.5–15.5)

## 2013-02-15 LAB — POCT I-STAT TROPONIN I: Troponin i, poc: 0.02 ng/mL (ref 0.00–0.08)

## 2013-02-15 LAB — GLUCOSE, CAPILLARY: Glucose-Capillary: 112 mg/dL — ABNORMAL HIGH (ref 70–99)

## 2013-02-15 LAB — APTT: aPTT: 20 seconds — ABNORMAL LOW (ref 24–37)

## 2013-02-15 MED ORDER — HEPARIN SODIUM (PORCINE) 1000 UNIT/ML IJ SOLN
INTRAMUSCULAR | Status: AC
  Filled 2013-02-15: qty 1

## 2013-02-15 MED ORDER — FENTANYL CITRATE 0.05 MG/ML IJ SOLN
INTRAMUSCULAR | Status: DC | PRN
  Administered 2013-02-15: 25 ug via INTRAVENOUS

## 2013-02-15 MED ORDER — IOHEXOL 350 MG/ML SOLN
100.0000 mL | Freq: Once | INTRAVENOUS | Status: AC | PRN
  Administered 2013-02-15: 100 mL via INTRAVENOUS

## 2013-02-15 MED ORDER — MIDAZOLAM HCL 2 MG/2ML IJ SOLN
INTRAMUSCULAR | Status: AC
  Filled 2013-02-15: qty 2

## 2013-02-15 MED ORDER — MIDAZOLAM HCL 2 MG/2ML IJ SOLN
INTRAMUSCULAR | Status: DC | PRN
  Administered 2013-02-15: 0.5 mg via INTRAVENOUS

## 2013-02-15 MED ORDER — FENTANYL CITRATE 0.05 MG/ML IJ SOLN
INTRAMUSCULAR | Status: AC
  Administered 2013-02-16 (×2): 50 ug via INTRAVENOUS
  Administered 2013-02-16 (×2): 100 ug via INTRAVENOUS
  Administered 2013-02-16: 50 ug via INTRAVENOUS
  Administered 2013-02-16: 150 ug via INTRAVENOUS
  Filled 2013-02-15: qty 2

## 2013-02-15 NOTE — ED Notes (Signed)
EKG given to Dr. Oletta Lamas

## 2013-02-15 NOTE — ED Notes (Signed)
pending CT, CT contacted, pt up to b/r with assistance.

## 2013-02-15 NOTE — ED Notes (Signed)
Dr. Roseanne Reno in to room. Mother at [facility], VSS. C/o HA.

## 2013-02-15 NOTE — ED Notes (Signed)
Swallow screen failed, pt alert, but mildly lethargic, slow to respond, not following some commands.

## 2013-02-15 NOTE — ED Notes (Signed)
lovenox cancelled, pharm notified.

## 2013-02-15 NOTE — ED Notes (Signed)
Dr. Roseanne Reno at [facility], results and plan explained. No changes. Pt layed flat, O2 2L [place] applied, rationale explained. NPO explained.

## 2013-02-15 NOTE — ED Provider Notes (Addendum)
History     CSN: [id number]  Arrival date & time 02/15/13  4098   First MD Initiated Contact with Patient 02/15/13 1839      Chief Complaint  Patient presents with  . Cerebrovascular Accident    (Consider location/radiation/quality/duration/timing/severity/associated sxs/prior treatment) HPI Comments: Level 5 caveat due to possible stroke and difficult history and also change in baseline mentation.  Patient with prior history of stroke in January of this year. Quick review of medical record shows that she had a Cavernous venous thrombosis as well as a PCA strokes by MRI. Patient was previously being treated for a deep venous thrombosis on Coumadin. Per EMS, the patient had apparently driven to a fast food restaurant to bring back food. A neighbor noticed that the patient was staggering and had dropped most of everything that she had bought on the ground. However the patient also reports that she's had a migraine and headache for the last several days. Per EMS it is unclear when her symptoms began and that she was last seen normal. Patient lives alone.  Patient is a 37 y.o. female presenting with Acute Neurological Problem. The history is provided by the patient, medical records and the EMS personnel.  Cerebrovascular Accident    Past Medical History  Diagnosis Date  . PVD (peripheral vascular disease)   . Benign tumor of back   . Swelling, lymph nodes 2006    intermittant, benign  . DVT (deep venous thrombosis)   . Stroke     last week here    Past Surgical History  Procedure Laterality Date  . Ivc filter placement    . Breast surgery      bil breast implants    Family History  Problem Relation Age of Onset  . Cancer - Other Father   . Coronary artery disease Mother   . Stroke Mother   . Hypertension Mother   . Hypertension Sister     History  Substance Use Topics  . Smoking status: Never Smoker   . Smokeless tobacco: Never Used  . Alcohol Use: Yes    OB  History   Grav Para Term Preterm Abortions TAB SAB Ect Mult Living                  Review of Systems  Unable to perform ROS: Mental status change    Allergies  Review of patient's allergies indicates no known allergies.  Home Medications   Current Outpatient Rx  Name  Route  Sig  Dispense  Refill  . ALPRAZolam (XANAX) 1 MG tablet   Oral   Take 1 tablet (1 mg total) by mouth at bedtime as needed for anxiety.   20 tablet   0   . predniSONE (DELTASONE) 20 MG tablet   Oral   Take 20 mg by mouth daily.          Marland Kitchen warfarin (COUMADIN) 7.5 MG tablet   Oral   Take 3.75 mg by mouth daily.          Marland Kitchen zolpidem (AMBIEN) 10 MG tablet   Oral   Take 1 tablet (10 mg total) by mouth at bedtime as needed for sleep.   20 tablet   0     BP 134/75  Pulse 78  Temp(Src) 98.4 F (36.9 C) (Oral)  Resp 10  SpO2 98%  LMP 02/15/2013  Physical Exam  Nursing note and vitals reviewed. Constitutional: She appears well-developed and well-nourished. She appears listless.  Non-toxic appearance. No  distress.  HENT:  Head: Normocephalic and atraumatic.  Eyes: EOM are normal.  Neck: Neck supple.  Cardiovascular: Normal rate and regular rhythm.   Pulmonary/Chest: Effort normal. No respiratory distress.  Abdominal: Soft. She exhibits no distension. There is no tenderness. There is no rebound.  Neurological: She appears listless. A cranial nerve deficit and sensory deficit is present. She exhibits normal muscle tone. GCS eye subscore is 3. GCS verbal subscore is 4. GCS motor subscore is 6.  Left UE weakness 3/5.  Facial droop involving left face  Skin: Skin is warm.  Psychiatric: Her mood appears not anxious. Her affect is blunt. Her affect is not angry. Her speech is delayed and tangential. She is slowed. She does not exhibit a depressed mood. She is inattentive.    ED Course  Procedures (including critical care time)  CRITICAL CARE Performed by: Lear Ng.   Total critical  care time: *30 min Critical care time was exclusive of separately billable procedures and treating other patients.  Critical care was necessary to treat or prevent imminent or life-threatening deterioration.  Critical care was time spent personally by me on the following activities: development of treatment plan with patient and/or surrogate as well as nursing, discussions with consultants, evaluation of patient's response to treatment, examination of patient, obtaining history from patient or surrogate, ordering and performing treatments and interventions, ordering and review of laboratory studies, ordering and review of radiographic studies, pulse oximetry and re-evaluation of patient's condition.   Labs Reviewed  APTT - Abnormal; Notable for the following:    aPTT 20 (*)    All other components within normal limits  CBC - Abnormal; Notable for the following:    WBC 18.1 (*)    MCV 77.6 (*)    MCH 25.3 (*)    RDW 17.8 (*)    All other components within normal limits  DIFFERENTIAL - Abnormal; Notable for the following:    Neutrophils Relative 85 (*)    Neutro Abs 15.3 (*)    Lymphocytes Relative 9 (*)    Monocytes Absolute 1.1 (*)    All other components within normal limits  COMPREHENSIVE METABOLIC PANEL - Abnormal; Notable for the following:    Glucose, Bld 104 (*)    Albumin 3.2 (*)    Total Bilirubin 0.2 (*)    All other components within normal limits  GLUCOSE, CAPILLARY - Abnormal; Notable for the following:    Glucose-Capillary 112 (*)    All other components within normal limits  POCT I-STAT, CHEM 8 - Abnormal; Notable for the following:    Glucose, Bld 111 (*)    All other components within normal limits  PROTIME-INR  TROPONIN I  POCT I-STAT TROPONIN I   No results found.   1. Stroke     EKG performed at time 19:03, shows sinus rhythm at a rate of 78, normal axis, diffuse J-point elevation diffusely. Otherwise normal intervals. Similar in appearance to that from  12/11/2012 except rate is improved. Interpretation is likely normal EKG.  8:41 PM Head CT is still pending. I did discuss briefly with Dr. Roseanne Reno, neurology. He agrees that the patient is not a TPA candidate. He does feel that since she is not a TPA candidate, admission to step down under the medical service would be prudent. Neurology team will continue to follow will followup on CT scanning here.   9:16 PM INR is normal, likely not taking her coumadin.  Dr. Roseanne Reno reports pt will need therapeutic dose lovenox  until bridge to coumadin is achived pending results of CT scans.     9:51 PM Dr. Roseanne Reno reports interventional radiology may be able to perform intervention and to hold lovenox for now.  I expect pt to be admitted to NICU following intervention.  MDM   Patient with symptoms suggestive of right brain stroke. Time of onset is undetermined. Patient reportedly is supposed to be on Coumadin which may make her a non-TPA candidate as does unknown time of onset. Due to her age and her severity of symptoms, I did immediately consult with neurology who recommended CT perfusion studies and that they would evaluate the patient shortly in the emergency department. No airway compromise. Patient will require admission.        Gavin Pound. Oletta Lamas, MD 02/15/13 2116  Gavin Pound. Oletta Lamas, MD 02/15/13 2152

## 2013-02-15 NOTE — ED Notes (Signed)
Back from CT, Dr. Roseanne Reno aware.

## 2013-02-15 NOTE — ED Notes (Signed)
Pt to be going to IR, pending arrival, CN and RRT RN notified.

## 2013-02-15 NOTE — ED Notes (Signed)
To CT

## 2013-02-15 NOTE — ED Notes (Signed)
2nd PIV established after 2 more sticks, unable to obtain blood (clotted), lab at [facility], Dr. Roseanne Reno  (neuro) at [facility], family x2 at [facility], pt alert, NAD, calm, interactive, speech clear, soft spoken,  c/o "feeling cold".

## 2013-02-15 NOTE — ED Notes (Signed)
Per EMS, stroke x 1 month ago. Migraine HA and left side weakness, arm drift, leg weakness an facial droop. Pt ambulated with assistance on scene. Unknown of previous deficits from stroke. sts on blood thinners due to hx of blood clot. BP 144/72. Pulse 84. A&O but lethargic.

## 2013-02-15 NOTE — ED Notes (Addendum)
Report given to Marylene Land, RN in IR, no changes, pt alert, sitting upright, preparing to transfer to IR table, mother at [facility].

## 2013-02-16 ENCOUNTER — Inpatient Hospital Stay (HOSPITAL_COMMUNITY): Payer: Medicare Other

## 2013-02-16 ENCOUNTER — Encounter (HOSPITAL_COMMUNITY): Payer: Self-pay | Admitting: Radiology

## 2013-02-16 ENCOUNTER — Encounter (HOSPITAL_COMMUNITY): Payer: Self-pay | Admitting: Anesthesiology

## 2013-02-16 ENCOUNTER — Inpatient Hospital Stay (HOSPITAL_COMMUNITY): Payer: Medicare Other | Admitting: Anesthesiology

## 2013-02-16 DIAGNOSIS — J95821 Acute postprocedural respiratory failure: Secondary | ICD-10-CM | POA: Diagnosis not present

## 2013-02-16 DIAGNOSIS — I10 Essential (primary) hypertension: Secondary | ICD-10-CM

## 2013-02-16 DIAGNOSIS — J9589 Other postprocedural complications and disorders of respiratory system, not elsewhere classified: Secondary | ICD-10-CM

## 2013-02-16 DIAGNOSIS — M314 Aortic arch syndrome [Takayasu]: Secondary | ICD-10-CM

## 2013-02-16 DIAGNOSIS — I498 Other specified cardiac arrhythmias: Secondary | ICD-10-CM

## 2013-02-16 DIAGNOSIS — I6350 Cerebral infarction due to unspecified occlusion or stenosis of unspecified cerebral artery: Secondary | ICD-10-CM

## 2013-02-16 DIAGNOSIS — I749 Embolism and thrombosis of unspecified artery: Secondary | ICD-10-CM

## 2013-02-16 DIAGNOSIS — R000 Tachycardia, unspecified: Secondary | ICD-10-CM | POA: Diagnosis not present

## 2013-02-16 LAB — BASIC METABOLIC PANEL
BUN: 5 mg/dL — ABNORMAL LOW (ref 6–23)
CO2: 23 mEq/L (ref 19–32)
Chloride: 104 mEq/L (ref 96–112)
GFR calc non Af Amer: >90 mL/min (ref >90)
Glucose, Bld: 122 mg/dL — ABNORMAL HIGH (ref 70–99)
Potassium: 3.1 mEq/L — ABNORMAL LOW (ref 3.5–5.1)
Sodium: 137 mEq/L (ref 135–145)

## 2013-02-16 LAB — LIPID PANEL
Cholesterol: 151 mg/dL (ref 0–200)
LDL Cholesterol: 58 mg/dL (ref 0–99)
Total CHOL/HDL Ratio: 2.5 RATIO
VLDL: 33 mg/dL (ref 0–40)

## 2013-02-16 LAB — BLOOD GAS, ARTERIAL
Acid-base deficit: 1.5 mmol/L (ref 0.0–2.0)
Bicarbonate: 19.4 mEq/L — ABNORMAL LOW (ref 20.0–24.0)
Bicarbonate: 22.5 mEq/L (ref 20.0–24.0)
Drawn by: 347641
FIO2: 0.4 %
PEEP: 5 cmH2O
Patient temperature: 94.6
RATE: 14 resp/min
TCO2: 23.6 mmol/L (ref 0–100)
pCO2 arterial: 33.6 mmHg — ABNORMAL LOW (ref 35.0–45.0)
pH, Arterial: 7.56 — ABNORMAL HIGH (ref 7.350–7.450)
pH, Arterial: 7.432 (ref 7.350–7.450)
pO2, Arterial: 202 mmHg — ABNORMAL HIGH (ref 80.0–100.0)
pO2, Arterial: 187 mmHg — ABNORMAL HIGH (ref 80.0–100.0)

## 2013-02-16 LAB — CBC WITH DIFFERENTIAL/PLATELET
Eosinophils Absolute: 0 10*3/uL (ref 0.0–0.7)
Hemoglobin: 10.6 g/dL — ABNORMAL LOW (ref 12.0–15.0)
Lymphocytes Relative: 21 % (ref 12–46)
Lymphs Abs: 2.7 10*3/uL (ref 0.7–4.0)
Monocytes Relative: 5 % (ref 3–12)
Neutro Abs: 9.4 10*3/uL — ABNORMAL HIGH (ref 1.7–7.7)
Neutrophils Relative %: 74 % (ref 43–77)
Platelets: 295 10*3/uL (ref 150–400)
RBC: 4.18 MIL/uL (ref 3.87–5.11)
WBC: 12.7 10*3/uL — ABNORMAL HIGH (ref 4.0–10.5)

## 2013-02-16 LAB — GLUCOSE, CAPILLARY
Glucose-Capillary: 98 mg/dL (ref 70–99)
Glucose-Capillary: 97 mg/dL (ref 70–99)
Glucose-Capillary: 66 mg/dL — ABNORMAL LOW (ref 70–99)

## 2013-02-16 MED ORDER — IOHEXOL 300 MG/ML  SOLN
150.0000 mL | Freq: Once | INTRAMUSCULAR | Status: AC | PRN
  Administered 2013-02-16: 120 mL via INTRA_ARTERIAL

## 2013-02-16 MED ORDER — ACETAMINOPHEN 500 MG PO TABS: 1000.0000 mg | ORAL_TABLET | Freq: Four times a day (QID) | ORAL | Status: DC | PRN

## 2013-02-16 MED ORDER — ASPIRIN 300 MG RE SUPP
300.0000 mg | Freq: Every day | RECTAL | Status: DC
  Administered 2013-02-16: 300 mg via RECTAL
  Filled 2013-02-16 (×2): qty 1

## 2013-02-16 MED ORDER — CHLORHEXIDINE GLUCONATE CLOTH 2 % EX PADS
6.0000 | MEDICATED_PAD | Freq: Every day | CUTANEOUS | Status: AC
  Administered 2013-02-17 – 2013-02-21 (×5): 6 via TOPICAL

## 2013-02-16 MED ORDER — ONDANSETRON HCL 4 MG/2ML IJ SOLN: 4.0000 mg | Freq: Four times a day (QID) | INTRAMUSCULAR | Status: DC | PRN

## 2013-02-16 MED ORDER — ACETAMINOPHEN 650 MG RE SUPP: 650.0000 mg | Freq: Four times a day (QID) | RECTAL | Status: DC | PRN

## 2013-02-16 MED ORDER — SODIUM CHLORIDE 0.9 % IV SOLN
INTRAVENOUS | Status: DC
  Administered 2013-02-16: 75 mL/h via INTRAVENOUS
  Administered 2013-02-16 – 2013-02-17 (×2): via INTRAVENOUS

## 2013-02-16 MED ORDER — PROPOFOL 10 MG/ML IV EMUL
5.0000 ug/kg/min | INTRAVENOUS | Status: DC
  Administered 2013-02-16: 25 ug/kg/min via INTRAVENOUS
  Filled 2013-02-16: qty 100
  Filled 2013-02-16 (×11): qty 20

## 2013-02-16 MED ORDER — INSULIN ASPART 100 UNIT/ML ~~LOC~~ SOLN
0.0000 [IU] | SUBCUTANEOUS | Status: DC
  Administered 2013-02-18 (×2): 2 [IU] via SUBCUTANEOUS
  Administered 2013-02-18 – 2013-02-19 (×2): 3 [IU] via SUBCUTANEOUS
  Administered 2013-02-20 (×2): 2 [IU] via SUBCUTANEOUS
  Administered 2013-02-21: 3 [IU] via SUBCUTANEOUS
  Administered 2013-02-21: 2 [IU] via SUBCUTANEOUS

## 2013-02-16 MED ORDER — FENTANYL CITRATE 0.05 MG/ML IJ SOLN: 25.0000 ug | INTRAMUSCULAR | Status: DC | PRN

## 2013-02-16 MED ORDER — MUPIROCIN 2 % EX OINT
1.0000 "application " | TOPICAL_OINTMENT | Freq: Two times a day (BID) | CUTANEOUS | Status: DC
  Administered 2013-02-16 – 2013-02-20 (×10): 1 via NASAL
  Filled 2013-02-16: qty 22

## 2013-02-16 MED ORDER — ARTIFICIAL TEARS OP OINT
TOPICAL_OINTMENT | OPHTHALMIC | Status: DC | PRN
  Administered 2013-02-16: 1 via OPHTHALMIC

## 2013-02-16 MED ORDER — NICARDIPINE HCL IN NACL 20-0.86 MG/200ML-% IV SOLN
5.0000 mg/h | INTRAVENOUS | Status: DC
  Administered 2013-02-16: 5 mg/h via INTRAVENOUS

## 2013-02-16 MED ORDER — OXYCODONE HCL 5 MG/5ML PO SOLN: 5.0000 mg | Freq: Once | ORAL | Status: DC | PRN

## 2013-02-16 MED ORDER — SODIUM CHLORIDE 0.9 % IV SOLN
INTRAVENOUS | Status: DC | PRN
  Administered 2013-02-16 (×2): via INTRAVENOUS

## 2013-02-16 MED ORDER — PROPOFOL 10 MG/ML IV EMUL
5.0000 ug/kg/min | INTRAVENOUS | Status: DC
  Administered 2013-02-16: 70 ug/kg/min via INTRAVENOUS
  Administered 2013-02-16: 60 ug/kg/min via INTRAVENOUS
  Administered 2013-02-16: 50 ug/kg/min via INTRAVENOUS
  Administered 2013-02-16: 70 ug/kg/min via INTRAVENOUS
  Administered 2013-02-16 – 2013-02-17 (×2): 60 ug/kg/min via INTRAVENOUS
  Filled 2013-02-16 (×6): qty 100

## 2013-02-16 MED ORDER — METOPROLOL TARTRATE 1 MG/ML IV SOLN
5.0000 mg | Freq: Once | INTRAVENOUS | Status: AC
  Administered 2013-02-16: 5 mg via INTRAVENOUS

## 2013-02-16 MED ORDER — OXYCODONE HCL 5 MG PO TABS: 5.0000 mg | ORAL_TABLET | Freq: Once | ORAL | Status: DC | PRN

## 2013-02-16 MED ORDER — PROPOFOL 10 MG/ML IV BOLUS
INTRAVENOUS | Status: DC | PRN
  Administered 2013-02-16: 200 mg via INTRAVENOUS
  Administered 2013-02-16: 120 mg via INTRAVENOUS

## 2013-02-16 MED ORDER — SUCCINYLCHOLINE CHLORIDE 20 MG/ML IJ SOLN
INTRAMUSCULAR | Status: DC | PRN
  Administered 2013-02-16: 100 mg via INTRAVENOUS

## 2013-02-16 MED ORDER — METHYLPREDNISOLONE SODIUM SUCC 40 MG IJ SOLR
20.0000 mg | Freq: Every day | INTRAMUSCULAR | Status: DC
  Administered 2013-02-16: 13:00:00 via INTRAVENOUS
  Filled 2013-02-16 (×2): qty 0.5

## 2013-02-16 MED ORDER — NITROGLYCERIN 1 MG/10 ML FOR IR/CATH LAB
INTRA_ARTERIAL | Status: AC
  Filled 2013-02-16: qty 10

## 2013-02-16 MED ORDER — CHLORHEXIDINE GLUCONATE 0.12 % MT SOLN
15.0000 mL | Freq: Two times a day (BID) | OROMUCOSAL | Status: DC
  Administered 2013-02-16 – 2013-02-17 (×4): 15 mL via OROMUCOSAL
  Filled 2013-02-16 (×5): qty 15

## 2013-02-16 MED ORDER — PANTOPRAZOLE SODIUM 40 MG IV SOLR
40.0000 mg | Freq: Every day | INTRAVENOUS | Status: DC
  Administered 2013-02-16: 40 mg via INTRAVENOUS
  Filled 2013-02-16 (×2): qty 40

## 2013-02-16 MED ORDER — POTASSIUM CHLORIDE 20 MEQ/15ML (10%) PO LIQD
40.0000 meq | ORAL | Status: AC
  Administered 2013-02-16 (×2): 40 meq
  Filled 2013-02-16 (×2): qty 30

## 2013-02-16 MED ORDER — ROCURONIUM BROMIDE 100 MG/10ML IV SOLN
INTRAVENOUS | Status: DC | PRN
  Administered 2013-02-16 (×2): 50 mg via INTRAVENOUS

## 2013-02-16 MED ORDER — BIOTENE DRY MOUTH MT LIQD
15.0000 mL | Freq: Four times a day (QID) | OROMUCOSAL | Status: DC
  Administered 2013-02-16 – 2013-02-17 (×7): 15 mL via OROMUCOSAL

## 2013-02-16 MED ORDER — METOPROLOL TARTRATE 1 MG/ML IV SOLN
INTRAVENOUS | Status: AC
  Filled 2013-02-16: qty 5

## 2013-02-16 NOTE — Progress Notes (Signed)
Right:  No evidence of hemodynamically significant internal carotid artery stenosis.  Left:  CCA appears occluded.  The ECA appears to be feeding the ICA.  Vertebral artery appears occluded.

## 2013-02-16 NOTE — ED Notes (Signed)
Anesthesia arrived

## 2013-02-16 NOTE — Progress Notes (Signed)
INITIAL NUTRITION ASSESSMENT  DOCUMENTATION CODES Per approved criteria  -Not Applicable   INTERVENTION: - If prolonged intubated expected, recommend initiation of EN within 24-48 hours of intubation - If EN desired, recommend Promote @ 10 ml/hr via EN tube. Add 30 ml PS to tube 5x daily. This regimen + current propofol rate provides 1358 kcal (96%), 90 grams of protein (100%) and 201 ml of free water.    NUTRITION DIAGNOSIS: Inadequate oral intake related to inability to eat as evidenced by NPO status.   Goal: Pt to meet >/= 90% of estimated needs   Monitor:  Vent settings, weight trends   Reason for Assessment: ventilator   37 y.o. female  Admitting Dx: stroke    ASSESSMENT: Pt is 37 yo female with a hx of Takayasu's arteritis, stroke in January 2014 and deep vein thrombosis, presenting with complaint of headache for several days and apparent new onset of left facial and extremity weakness. Onset is unclear. Pt underwent right ICA arteriogram with partial recanalization. She was intubated for the procedure and brought to the ICU for monitoring afterwards.   Dietetic Intern spoke with RN regarding pt. Pt continues to need propofol.  Unclear when pt will be extubated.   Pt is currently sedated, intubated and on ventilator support.  MV: 6.1  Temp: 36.3 Propofol: 23.4 ml/hr providing 618 kcal daily    Height: Ht Readings from Last 1 Encounters:  02/16/13 5\' 8"  (1.727 m)    Weight: Wt Readings from Last 1 Encounters:  02/16/13 153 lb 3.5 oz (69.5 kg)    Ideal Body Weight: 140 lbs   % Ideal Body Weight: 109%  Wt Readings from Last 10 Encounters:  02/16/13 153 lb 3.5 oz (69.5 kg)  12/07/12 143 lb 11.2 oz (65.182 kg)    Usual Body Weight: n/a   % Usual Body Weight: n/a   BMI:  Body mass index is 23.3 kg/(m^2). WNL   Estimated Nutritional Needs: Kcal: 1406  Protein: 90-105 gm Fluid: >1.5 L   Skin: incisions right and left groin   Diet Order:  NPO  EDUCATION NEEDS: -No education needs identified at this time   Intake/Output Summary (Last 24 hours) at 02/16/13 0839 Last data filed at 02/16/13 0800  Gross per 24 hour  Intake 1760.85 ml  Output   1650 ml  Net 110.85 ml    Last BM: none documented    Labs:   Recent Labs Lab 02/15/13 1943 02/15/13 1957 02/16/13 0500  NA 138 138 137  K 3.8 3.7 3.1*  CL 101 102 104  CO2 25  --  23  BUN 8 7 5*  CREATININE 0.70 0.80 0.57  CALCIUM 9.1  --  8.1*  GLUCOSE 104* 111* 122*    CBG (last 3)   Recent Labs  02/15/13 1852 02/16/13 0430  GLUCAP 112* 98    Scheduled Meds: . antiseptic oral rinse  15 mL Mouth Rinse QID  . chlorhexidine  15 mL Mouth Rinse BID  . [START ON 02/17/2013] Chlorhexidine Gluconate Cloth  6 each Topical Q0600  . insulin aspart  0-15 Units Subcutaneous Q4H  . midazolam      . mupirocin ointment  1 application Nasal BID  . nitroGLYCERIN      . pantoprazole (PROTONIX) IV  40 mg Intravenous Daily  . potassium chloride  40 mEq Per Tube Q4H    Continuous Infusions: . sodium chloride 75 mL/hr at 02/16/13 0800  . niCARDipine Stopped (02/16/13 0500)  . propofol 40  mcg/kg/min (02/16/13 0800)    Past Medical History  Diagnosis Date  . PVD (peripheral vascular disease)   . Benign tumor of back   . Swelling, lymph nodes 2006    intermittant, benign  . DVT (deep venous thrombosis)   . Stroke     last week here    Past Surgical History  Procedure Laterality Date  . Ivc filter placement    . Breast surgery      bil breast implants    Belenda Cruise  Dietetic Intern Pager: [phone]   Student note/chart reviewed. Kendell Bane RD, LDN, CNSC [phone] Pager [phone] After Hours Pager

## 2013-02-16 NOTE — Progress Notes (Signed)
Stroke Team Progress Note  HISTORY [name] is an 37 y.o. 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 Onset is unclear. Deficits were noted early this afternoon 02/15/2013 when the mother went over to check on her because of apparent confusion described by neighbors. Patient has been on anticoagulation with Coumadin and has a history of less than ideal compliance. She states that she's been taking Coumadin. However, INR today was 1.14. No speech changes of the noted by the patient nor patient's mother. CT scan of the head showed an area of acute ischemia involving the right MCA territory, the majority of which appear to be penumbra and potentially reversible. 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 Filling defect involving the innominate artery was noted and thought to likely represent thrombus. The innominate artery was patent along with right common carotid artery. There was also occlusion of the right cavernous carotid and right M1 segment indicative embolic phenomena. NIH stroke scale was 8. Patient was not a TPA candidate secondary to delay in arrival as well as stroke within the past 3 months. She was admitted to the neuro ICU for further evaluation and treatment.  SUBJECTIVE   OBJECTIVE Most recent Vital Signs: Filed Vitals:   02/16/13 0700 02/16/13 0745 02/16/13 0800 02/16/13 0822  BP: 134/94 130/106 112/79 158/82  Pulse: 132 115 96 94  Temp:   97.4 F (36.3 C)   TempSrc:   Axillary   Resp: 23 18 14 12   Height:      Weight:      SpO2: 96% 100% 100% 100%   CBG (last 3)   Recent Labs  02/15/13 1852 02/16/13 0430  GLUCAP 112* 98   IV Fluid Intake:   . sodium chloride 75 mL/hr at 02/16/13 0800  . niCARDipine Stopped (02/16/13 0500)  . propofol 70  mcg/kg/min (02/16/13 0919)   MEDICATIONS  . antiseptic oral rinse  15 mL Mouth Rinse QID  . chlorhexidine  15 mL Mouth Rinse BID  . [START ON 02/17/2013] Chlorhexidine Gluconate Cloth  6 each Topical Q0600  . insulin aspart  0-15 Units Subcutaneous Q4H  . methylPREDNISolone (SOLU-MEDROL) injection  20 mg Intravenous Daily  . midazolam      . mupirocin ointment  1 application Nasal BID  . nitroGLYCERIN      . pantoprazole (PROTONIX) IV  40 mg Intravenous Daily   PRN:  acetaminophen, acetaminophen, fentaNYL, ondansetron (ZOFRAN) IV  Diet:  NPO  Activity:  Bedrest DVT Prophylaxis:  SCDs   CLINICALLY SIGNIFICANT STUDIES Basic Metabolic Panel:   Recent Labs Lab 02/15/13 1943 02/15/13 1957 02/16/13 0500  NA 138 138 137  K 3.8 3.7 3.1*  CL 101 102 104  CO2 25  --  23  GLUCOSE 104* 111* 122*  BUN 8 7 5*  CREATININE 0.70 0.80 0.57  CALCIUM 9.1  --  8.1*   Liver Function Tests:   Recent Labs Lab 02/15/13 1943  AST 18  ALT 8  ALKPHOS 80  BILITOT 0.2*  PROT 7.6  ALBUMIN 3.2*   CBC:   Recent Labs Lab 02/15/13 1943 02/15/13 1957 02/16/13 0500  WBC 18.1*  --  12.7*  NEUTROABS 15.3*  --  9.4*  HGB 12.0 13.3 10.6*  HCT 36.8 39.0 31.7*  MCV 77.6*  --  75.8*  PLT 284  --  295   Coagulation:   Recent Labs Lab 02/15/13 1943  LABPROT 14.4  INR 1.14   Cardiac Enzymes:   Recent Labs Lab 02/15/13 1944  TROPONINI <0.30   Urinalysis: No results found for this basename: COLORURINE, APPERANCEUR, LABSPEC, PHURINE, GLUCOSEU, HGBUR, BILIRUBINUR, KETONESUR, PROTEINUR, UROBILINOGEN, NITRITE, LEUKOCYTESUR,  in the last 168 hours Lipid Panel    Component Value Date/Time   CHOL 151 02/16/2013 0500   TRIG 166* 02/16/2013 0500   HDL 60 02/16/2013 0500   CHOLHDL 2.5 02/16/2013 0500   VLDL 33 02/16/2013 0500   LDLCALC 58 02/16/2013 0500   HgbA1C  Lab Results  Component Value Date   HGBA1C 6.2* 12/06/2012   Urine Drug Screen:   No results found for this basename: labopia,   cocainscrnur,  labbenz,  amphetmu,  thcu,  labbarb    Alcohol Level: No results found for this basename: ETH,  in the last 168 hours  CT of the brain  02/16/2013  Low attenuation in the right middle cerebral artery distribution compatible with acute / subacute infarction.  No definite hemorrhage.  Probable small area of contrast staining in the insula.  Redundant loop of the nasogastric tube is present in the hypopharynx.  Adjustment is recommended.     Ct Angio Head W/cm &/or Wo Cm 02/15/2013  CT indicates  low density edema in the right insula suggestive of acute infarct.  Occlusion of the right cavernous carotid and right M1 segment suggestive of acute embolus.  There is a filling defect in the innominate artery which may be the source of embolus.  Chronic occlusion left carotid artery with reconstitution at the skull base and good perfusion of left M1 and A1 segments on the left.  Small left vertebral artery which is occluded proximally but reconstitutes in the mid neck.    CT Angio Neck W/cm &/or Wo/cm 02/15/2013 Chronic occlusion of the left carotid artery, left subclavian artery, and left vertebral artery as seen on prior studies due to arteritis.  Filling defect in the innominate artery may be due to fresh thrombus.  The right carotid bifurcation is widely patent.  There is occlusion of the right cavernous carotid and right M1 segment suggesting an embolus.  There is flow and right middle cerebral artery branches.    CT Cerebral Perfusion W/cm 02/15/2013  Acute infarct right MCA territory.  Findings suggest majority of this may be penumbra and could potentially be reversible.    Cerebral Angiogrampartial recanalization of occluded distal RT ICA Using trevoprovue thrombectomy. device.  MRI of the brain    MRA of the brain    2D Echocardiogram    Carotid Doppler  Right: No evidence of hemodynamically significant internal carotid artery stenosis. Left: CCA appears occluded. The ECA appears to be  feeding the ICA. Vertebral artery appears occluded.   CXR  02/16/2013   1.  Endotracheal tube 45 mm from the carina.  Good position. 2.  Nasogastric tube proximal side port at the gastroesophageal junction.  Consider advancing the NG tube without 3 cm to prevent gastroesophageal reflux. 3.  No active cardiopulmonary disease.   EKG  normal sinus rhythm.   Therapy Recommendations   Physical Exam   Patient sedated on Propofol drip.intubated. Afebrile. Head is nontraumatic. Neck is supple without bruit.  Cardiac exam no murmur or gallop. Lungs are clear to auscultation. Distal pulses are well felt.bilateral groin arterial sheaths present.  Neurological Exam : stuporous.Eyes are closed. Right gaze deviation. Doll's  eye movements absent to the left. Pupils 4 mm equal reactive. Corneal reflexes are present. Has weak cough and gag. Left lower facial weakness. Tongue is midline. She has purposeful and localizing movements on the right side to sternal rub and noxious stimuli. No left upper extremity movement to pain. Partial left lower extremity withdrawal to painful stimuli. Left plantar is upgoing. Right is downgoing. ASSESSMENT [name] is a 37 y.o. female presenting with confusion,  headache for several days and new onset left facial and extremity weakness. Imaging confirms a right MCA territory infarct in the setting of Takayasu's's arteritis and hypercoagulability from lupus anticoagulant. MRI pending. Infarct felt to be  thrombotic with occlusion of the right cavernous carotid and right M1 segment. On warfarin prior to admission; subtherapeutic INR on arrival 1.14 Now on aspirin suppository for secondary stroke prevention. Patient with resultant VDRF, left hemiparesis. Work up underway.   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 Highest documented BY 178/77  Hospital day # 1  TREATMENT/PLAN  Continue aspirin suppository for secondary stroke prevention.  D/c bilateral sheaths  F/u MRI, MRA, 2D, HgbA1c  Restrain LEs post sheath removal due to constant movement to prevent hemorrhage  Annie Main, MSN, RN, ANVP-BC, ANP-BC, GNP-BC [facility] Pager: [phone] 02/16/2013 9:41 AM  I have personally obtained a history, examined the patient, evaluated imaging results, and formulated the assessment and plan of care. I agree with the above. This patient is critically ill and at significant risk of neurological worsening, death and care requires constant monitoring of vital signs, hemodynamics,respiratory and cardiac monitoring,review of multiple databases, neurological assessment, discussion with family, other specialists and medical decision making of high complexity. I spent 30 minutes of neurocritical care time  in the care of  this patient. Delia Heady, MD

## 2013-02-16 NOTE — ED Notes (Signed)
Anesthesia notified.

## 2013-02-16 NOTE — Anesthesia Preprocedure Evaluation (Signed)
Anesthesia Evaluation  Patient identified by MRN, date of birth, ID band Patient confused    Reviewed: Allergy & Precautions, H&P , NPO status , Patient's Chart, lab work & pertinent test results  Airway Mallampati: II  Neck ROM: full    Dental   Pulmonary          Cardiovascular + Peripheral Vascular Disease     Neuro/Psych Depression ACUTE CVA CVA, Residual Symptoms    GI/Hepatic   Endo/Other    Renal/GU      Musculoskeletal   Abdominal   Peds  Hematology  (+) Blood dyscrasia, anemia ,   Anesthesia Other Findings   Reproductive/Obstetrics                           Anesthesia Physical Anesthesia Plan  ASA: II and emergent  Anesthesia Plan: General   Post-op Pain Management:    Induction: Intravenous  Airway Management Planned: Oral ETT  Additional Equipment: Arterial line  Intra-op Plan:   Post-operative Plan: Possible Post-op intubation/ventilation  Informed Consent: I have reviewed the patients History and Physical, chart, labs and discussed the procedure including the risks, benefits and alternatives for the proposed anesthesia with the patient or authorized representative who has indicated his/her understanding and acceptance.     Plan Discussed with: CRNA and Surgeon  Anesthesia Plan Comments:         Anesthesia Quick Evaluation

## 2013-02-16 NOTE — ED Notes (Signed)
Patient is slow to verbal response.  Does answer appropriately and follows commands.  Has left arm weakness and left facial droop. Pupils are equal and reactive.

## 2013-02-16 NOTE — Progress Notes (Signed)
SLP reviewed and agree with student findings.   Oswin Griffith MA, CCC-SLP [phone]    

## 2013-02-16 NOTE — Progress Notes (Signed)
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 altho sub therapeutic New onset confusion and facial droop Cerebral arteriogram with R ICA thrombectomy early this am  Objective: Vital signs in last 24 hours: Temp:  [94.6 F (34.8 C)-98.7 F (37.1 C)] 97.4 F (36.3 C) (03/24 0800) Pulse Rate:  [62-132] 96 (03/24 0800) Resp:  [10-29] 14 (03/24 0800) BP: (80-178)/(40-114) 112/79 mmHg (03/24 0800) SpO2:  [96 %-100 %] 100 % (03/24 0800) Arterial Line BP: (95-212)/(55-102) 126/82 mmHg (03/24 0800) FiO2 (%):  [30 %-40 %] 40 % (03/24 0800) Weight:  [143 lb (64.864 kg)-153 lb 3.5 oz (69.5 kg)] 153 lb 3.5 oz (69.5 kg) (03/24 0315)    Intake/Output from previous day: 03/23 0701 - 03/24 0700 In: 1671.8 [I.V.:1571.8; NG/GT:100] Out: 1650 [Urine:1650] Intake/Output this shift: Total I/O In: 89 [I.V.:89] Out: -   PE: afeb 150s/ 80s cardene off Still on diprovan Pt moving legs and rt arm spontaneously Tries to sit up in bed On vent B groin: no bleeding; no hematoma Sheaths intact B feet: 1+ pulses: rt better than lt K: 3.1    Lab Results:   Recent Labs  02/15/13 1943 02/15/13 1957 02/16/13 0500  WBC 18.1*  --  12.7*  HGB 12.0 13.3 10.6*  HCT 36.8 39.0 31.7*  PLT 284  --  295   BMET  Recent Labs  02/15/13 1943 02/15/13 1957 02/16/13 0500  NA 138 138 137  K 3.8 3.7 3.1*  CL 101 102 104  CO2 25  --  23  GLUCOSE 104* 111* 122*  BUN 8 7 5*  CREATININE 0.70 0.80 0.57  CALCIUM 9.1  --  8.1*   PT/INR  Recent Labs  02/15/13 1943  LABPROT 14.4  INR 1.14   ABG  Recent Labs  02/16/13 0420 02/16/13 0600  PHART 7.560* 7.432  HCO3 19.4* 22.5    Studies/Results: Ct Angio Head W/cm &/or Wo Cm  02/15/2013  *RADIOLOGY REPORT*  Clinical Data:  Acute onset left-sided weakness and confusion. Takayasu arteritis  CT ANGIOGRAPHY HEAD AND NECK  Technique:  Multidetector CT imaging of the head and neck was performed using the standard protocol during bolus  administration of intravenous contrast.  Multiplanar CT image reconstructions including MIPs were obtained to evaluate the vascular anatomy. Carotid stenosis measurements (when applicable) are obtained utilizing NASCET criteria, using the distal internal carotid diameter as the denominator.  Contrast: OMNIPAQUE IOHEXOL 350 MG/ML SOLN  Comparison:  MRI 12/05/2012  CTA NECK  Findings:  There is marked thickening of the aortic arch compatible with arteritis.  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  There is a filling defect in the innominate artery which appears to be thrombus.  The right innominate artery is patent.  The right common carotid artery is patent.  The carotid bifurcation is patent on the right.  There is occlusion of the right cavernous carotid artery which was widely patent on 12/05/2012. There is occlusion of most of the right M1 segment with some patent flow in the midportion.  There is flow in the right middle cerebral artery branches.  The findings suggest an embolus from the right innominate artery and to the right cavernous carotid and right M1 segment.   Review of the MIP images confirms the above findings.  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  Filling defect in the innominate artery may be due to fresh thrombus.  The right carotid bifurcation is widely patent.  There is occlusion of the right cavernous carotid and right M1 segment suggesting an embolus.  There is flow and right middle cerebral artery branches.  CTA HEAD  Findings:  Subtle hypodensity in the right insula compatible with acute infarct.  No associated hemorrhage. 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   Ventricles are not enlarged.  No midline shift.  Small area of chronic infarct left medial occipital lobe and left thalamus. 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  This vessel was patent on 12/05/2012.  There is partial occlusion of the right middle cerebral artery M1 segment.  There is fairly good flow in the right middle cerebral artery branches.  The right A1 segment and A2 segments are patent without filling defect.  The left internal carotid artery is occluded with reconstitution at the skull base via collaterals.  The left A1 segment and left anterior cerebral arteries are patent.  Left middle cerebral artery M1 and branches are patent.  Both vertebral arteries are patent to the basilar.  Small left vertebral reconstitutes in the mid neck.  The basilar is patent. Superior cerebellar and posterior cerebral arteries are patent bilaterally.  Negative for cerebral aneurysm.   Review of the MIP images confirms the above findings.  IMPRESSION: CT indicates  low density edema in the right insula suggestive of acute infarct.  Occlusion of the right cavernous carotid and right M1 segment suggestive of acute embolus.  There is a filling defect in the innominate artery which may be the source of embolus.  Chronic occlusion left carotid artery with reconstitution at the skull base and good perfusion of left M1 and A1 segments on the left.  Small left vertebral artery which is occluded proximally but reconstitutes in the mid neck.  I discussed the findings by telephone with Dr. Corliss Skains  at 2127 hours and with Dr. Noel Christmas at 2145 hours.   Original Report Authenticated By: Janeece Riggers, M.D.    Ct Head Wo Contrast  02/16/2013  *RADIOLOGY REPORT*  Clinical Data: Right CVA.  Post intervention CT.  CT HEAD WITHOUT CONTRAST  Technique:  Contiguous axial images were obtained from the base of the skull through the vertex without contrast.  Comparison: 02/15/2013.  Findings: There is loss of the insular  ribbon on the right, with sub insular edema compatible with acute or subacute infarction. High density is present in the intravascular compartment compatible with recent iodinated contrast administration.  The venous sinuses also demonstrate increased attenuation.  Left cerebral hemisphere appears normal.  Posterior fossa structures appear normal.  There may be a tiny amount of contrast staining along the course of the right MCA (image number 11 series 2).  Petechial hemorrhage considered less likely.  Paranasal sinuses appear within normal limits. No midline shift, hydrocephalus or definite evidence of hemorrhage.  IMPRESSION: Low attenuation in the right middle cerebral artery distribution compatible with acute / subacute infarction.  No definite hemorrhage.  Probable small area of contrast staining in the insula.  Redundant loop of the nasogastric tube is present in the hypopharynx.  Adjustment is recommended.  Finding was called to nurse Barbarann Ehlers at [phone] hours   Original Report Authenticated By: Andreas Newport, M.D.    Ct Angio Neck W/cm &/or Wo/cm  02/15/2013  *RADIOLOGY REPORT*  Clinical Data:  Acute onset left-sided weakness and confusion. Takayasu arteritis  CT ANGIOGRAPHY HEAD AND NECK  Technique:  Multidetector CT imaging of the head and neck was performed using the standard protocol during bolus administration of intravenous contrast.  Multiplanar CT image reconstructions including MIPs were obtained to evaluate the vascular anatomy. Carotid stenosis measurements (when applicable) are obtained utilizing NASCET criteria, using the distal internal carotid diameter as the denominator.  Contrast: OMNIPAQUE IOHEXOL 350 MG/ML SOLN  Comparison:  MRI 12/05/2012  CTA NECK  Findings:  There is marked thickening of the aortic arch compatible with arteritis.  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  There is a filling defect in the innominate artery which appears to be thrombus.  The right innominate artery is patent.  The right common carotid artery is patent.  The carotid bifurcation is patent on the right.  There is occlusion of the right cavernous carotid artery which was widely patent on 12/05/2012. There is occlusion of most of the right M1 segment with some patent flow in the midportion.  There is flow in the right middle cerebral artery branches.  The findings suggest an embolus from the right innominate artery and to the right cavernous carotid and right M1 segment.   Review of the MIP images confirms the above findings.  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  Filling defect in the innominate artery may be due to fresh thrombus.  The right carotid bifurcation is widely patent.  There is occlusion of the right cavernous carotid and right M1 segment suggesting an embolus.  There is flow and right middle cerebral artery branches.  CTA HEAD  Findings:  Subtle hypodensity in the right insula compatible with acute infarct.  No associated hemorrhage. 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  Ventricles are not enlarged.  No midline shift.  Small area of chronic infarct left medial occipital lobe and left thalamus. 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  This vessel was patent on 12/05/2012.  There is partial occlusion of the right middle cerebral artery M1 segment.  There is fairly good flow in the right middle cerebral artery branches.  The right A1 segment and A2 segments are patent without filling defect.  The left internal carotid artery is occluded with reconstitution at the skull base via collaterals.  The left A1 segment and left anterior cerebral arteries are patent.  Left middle  cerebral artery M1 and branches are patent.  Both vertebral arteries are patent to the basilar.  Small left vertebral reconstitutes in the mid neck.  The basilar is patent. Superior cerebellar and posterior cerebral arteries are patent bilaterally.  Negative for cerebral aneurysm.   Review of the MIP images confirms the above findings.  IMPRESSION: CT indicates  low density edema in the right insula suggestive of acute infarct.  Occlusion of the right cavernous carotid and right M1 segment suggestive of acute embolus.  There is a filling defect in the innominate artery which may be the source of embolus.  Chronic occlusion left carotid artery with reconstitution at the skull base and good perfusion of left M1 and A1 segments on the left.  Small left vertebral artery which is occluded proximally but reconstitutes in the mid neck.  I discussed the findings by telephone with Dr. Corliss Skains  at 2127 hours and with Dr. Noel Christmas at 2145 hours.   Original Report Authenticated By: Janeece Riggers, M.D.    Ct Cerebral Perfusion W/cm  02/15/2013  *RADIOLOGY REPORT*  Clinical Data:  Takayasu  arteritis.  Acute onset left-sided weakness and confusion.  CT CEREBRAL PERFUSION WITH CONTRAST  Contrast: OMNIPAQUE IOHEXOL 350 MG/ML SOLN  Comparison: MRI 12/05/2012  Findings: Decreased perfusion in the right MCA territory.  This involves the insula as well as the temporal parietal lobe.  There is decreased cerebral blood volume and decreased cerebral blood flow.  There is increased mean transit time.  Based on quantitative ROI assessment, much this territory may be at risk for infarction but not a complete infarction.  Cerebral blood flow is above 12 ml per 100 grams per minute.  Cerebral blood volume is greater than 2.5 ml per 100 grams.  IMPRESSION: Acute infarct right MCA territory.  Findings suggest majority of this may be penumbra and could potentially be reversible.   Original Report Authenticated By: Janeece Riggers,  M.D.    Portable Chest Xray  02/16/2013  *RADIOLOGY REPORT*  Clinical Data: Endotracheal tube placement.  PORTABLE CHEST - 1 VIEW  Comparison: 12/12/2011 chest radiograph.  Findings: Nasogastric tube is present with the tip in the stomach. The proximal side port is at the level of the left hemidiaphragm and the tube could be advanced about 3 cm to prevent gastroesophageal reflux.  There is no airspace disease or pleural effusion.  The cardiopericardial silhouette appears within normal limits.  Endotracheal tube tip is 45 mm from the carina. Monitoring leads are projected over the chest.  IMPRESSION:  1.  Endotracheal tube 45 mm from the carina.  Good position. 2.  Nasogastric tube proximal side port at the gastroesophageal junction.  Consider advancing the NG tube without 3 cm to prevent gastroesophageal reflux. 3.  No active cardiopulmonary disease.   Original Report Authenticated By: Andreas Newport, M.D.    Dg Abd Portable 1v  02/16/2013  *RADIOLOGY REPORT*  Clinical Data: Gastric tube placement.  PORTABLE ABDOMEN - 1 VIEW  Comparison: Chest radiograph same day.  Findings: Tip of nasogastric tube is in the gastric fundus.  IVC filter is present.  The right femoral vascular sheath is noted. There is also a smaller bore left femoral vascular sheath.  Bowel gas pattern appears within normal limits.  IMPRESSION: Nasogastric tube tip in the stomach.  See chest radiograph today.   Original Report Authenticated By: Andreas Newport, M.D.     Anti-infectives: Anti-infectives   None      Assessment/Plan: s/p  Cerebral arteriogram R ICA thrombectomy with Dr Corliss Skains early this am On vent For sheath pulls today MRI ordered Replete K per Stroke MD Will report to Dr Corliss Skains  LOS: 1 day    [name] A 02/16/2013

## 2013-02-16 NOTE — Procedures (Signed)
S/P rtCommon carotid arteriogram followed by partial recanalization of occluded distal RT ICA  Using trevoprovue thrombectomy. device.

## 2013-02-16 NOTE — Progress Notes (Signed)
PT Cancellation Note  Patient Details Name: [name] MRN: [id number] DOB: 09-25-1976   Cancelled Treatment:    Reason Eval/Treat Not Completed: Patient not medically ready (on vent with sheath) PT/OT to check back tomorrow.   Rollene Rotunda Trinette Vera, PT, DPT [phone]   02/16/2013, 9:10 AM

## 2013-02-16 NOTE — Progress Notes (Signed)
SLP Cancellation Note  Patient Details Name: [name] MRN: [id number] DOB: Sep 17, 1976   Cancelled treatment:       Reason Eval/Treat Not Completed: Medical issues which prohibited therapy. Patient on vent, will f/u when medically ready.  Berdine Dance SLP student Berdine Dance 02/16/2013, 8:20 AM

## 2013-02-16 NOTE — Transfer of Care (Signed)
Immediate Anesthesia Transfer of Care Note  Patient: [name]  Procedure(s) Performed: * No procedures listed *  Patient Location: NICU  Anesthesia Type:General  Level of Consciousness: Patient remains intubated per anesthesia plan  Airway & Oxygen Therapy: Patient remains intubated per anesthesia plan and Patient placed on Ventilator (see vital sign flow sheet for setting)  Post-op Assessment: Report given to PACU RN and Post -op Vital signs reviewed and stable  Post vital signs: Reviewed and stable  Complications: No apparent anesthesia complications

## 2013-02-16 NOTE — Progress Notes (Signed)
Spoke with MRI.  Prefers this patient's MRI to be done tomorrow due to a lot of patient's needing MRI and this one not being urgent.  Spoke with Annie Main, NP on whether or not to schedule CT for am.  She stated it is not needed and MRI is fine to be done tomorrow.

## 2013-02-16 NOTE — Anesthesia Preprocedure Evaluation (Deleted)
Anesthesia Evaluation  Patient identified by MRN, date of birth, ID band  Reviewed: Allergy & Precautions, H&P , Patient's Chart, lab work & pertinent test results, Unable to perform ROS - Chart review only  Airway Mallampati: II TM Distance: >3 FB Neck ROM: Full    Dental  (+) Teeth Intact   Pulmonary          Cardiovascular + Peripheral Vascular Disease     Neuro/Psych CVA    GI/Hepatic   Endo/Other    Renal/GU      Musculoskeletal   Abdominal   Peds  Hematology  (+) Blood dyscrasia, ,   Anesthesia Other Findings   Reproductive/Obstetrics                           Anesthesia Physical Anesthesia Plan  ASA: III and emergent  Anesthesia Plan: General   Post-op Pain Management:    Induction: Intravenous, Rapid sequence and Cricoid pressure planned  Airway Management Planned: Oral ETT  Additional Equipment: Arterial line  Intra-op Plan:   Post-operative Plan: Post-operative intubation/ventilation  Informed Consent:   History available from chart only and Only emergency history available  Plan Discussed with: Anesthesiologist and Surgeon  Anesthesia Plan Comments:        Anesthesia Quick Evaluation

## 2013-02-16 NOTE — Progress Notes (Signed)
eLink Physician-Brief Progress Note Patient Name: [name] DOB: Jul 10, 1976 MRN: [id number]  Date of Service  02/16/2013   HPI/Events of Note  Resp alkalosis on vent   eICU Interventions  Plan: Decrease TV to 450 cc Decrease RR to 10 ABG in 1 hour post vent change   Intervention Category Major Interventions: Acid-Base disturbance - evaluation and management  DETERDING,ELIZABETH 02/16/2013, 4:51 AM

## 2013-02-16 NOTE — Progress Notes (Signed)
eLink Physician-Brief Progress Note Patient Name: [name] DOB: [birth year] MRN: [id number]  Date of Service  02/16/2013   HPI/Events of Note   Hypokalemia  eICU Interventions  Potassium replaced   Intervention Category Minor Interventions: Electrolytes abnormality - evaluation and management  DETERDING,ELIZABETH 02/16/2013, 6:03 AM

## 2013-02-16 NOTE — Anesthesia Postprocedure Evaluation (Signed)
  Anesthesia Post-op Note  Patient: [name]  Procedure(s) Performed: * No procedures listed *  Patient Location: ICU  Anesthesia Type:Regional  Level of Consciousness: sedated  Airway and Oxygen Therapy: Patient remains intubated per anesthesia plan  Post-op Pain: none  Post-op Assessment: Post-op Vital signs reviewed, Patient's Cardiovascular Status Stable and Respiratory Function Stable  Post-op Vital Signs: Reviewed and stable  Complications: No apparent anesthesia complications

## 2013-02-16 NOTE — Preoperative (Signed)
Beta Blockers   Reason not to administer Beta Blockers:Not Applicable 

## 2013-02-16 NOTE — Progress Notes (Signed)
I agree with the following treatment note after reviewing documentation.   Johnston, Dene Landsberg Brynn   OTR/L Pager: [phone] Office: [phone] .   

## 2013-02-16 NOTE — H&P (Addendum)
Admission H&P    Chief Complaint: Headache with new onset left-sided weakness.  HPI: [name] is an 37 y.o. 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 Onset is unclear. Deficits were noted early this afternoon when the mother went over to check on her because of apparent confusion described by neighbors. Patient has been on anticoagulation with Coumadin and has a history of less than ideal compliance. She states that she's been taking Coumadin. However, INR today was 1.14. No speech changes of the noted by the patient nor patient's mother. CT scan of the head showed an area of acute ischemia involving the right MCA territory, the majority of which appear to be penumbra and potentially reversible. 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 Filling defect involving the innominate artery was noted and thought to likely represent thrombus. The innominate artery was patent along with right common carotid artery. There was also occlusion of the right cavernous carotid and right M1 segment indicative embolic phenomena. NIH stroke scale was 8.    LSN: Unclear tPA Given: No: Beyond time under for treatment consideration, and stroke 2 months ago. MRankin: 2  Past Medical History  Diagnosis Date  . PVD (peripheral vascular disease)   . Benign tumor of back   . Swelling, lymph nodes 2006    intermittant, benign  . DVT (deep venous thrombosis)   . Stroke     last week here    Past Surgical History  Procedure Laterality Date  . Ivc filter placement    . Breast surgery      bil breast implants    Family History  Problem Relation Age of Onset  . Cancer - Other Father   . Coronary artery disease Mother   . Stroke Mother   . Hypertension Mother   . Hypertension Sister     Social History:  reports that she has never smoked. She has never used smokeless tobacco. She reports that  drinks alcohol. She reports that she does not use illicit drugs.  Allergies: No Known Allergies  Medications: I have reviewed the patient's current medications.  Prior to Admission:  Xanax 1 mg at bedtime as needed for anxiety  Coumadin 3.75 mg daily  Ambien 10 mg at bedtime as needed  Prednisone 20 mg per day   ROS: Noncontributory except for presenting deficits, including headache for 2-3 days prior to onset of left-sided weakness which apparently started at some point today.  Physical Examination: Blood pressure 138/70, pulse 71, temperature 98.7 F (37.1 C), temperature source Oral, resp. rate 14, last menstrual period 02/15/2013, SpO2 100.00%.  HEENT-  Normocephalic, no lesions, without obvious abnormality.  Normal external eye and conjunctiva.  Normal TM's bilaterally.  Normal auditory canals and external ears. Normal external nose, mucus membranes and septum.  Normal pharynx. Neck supple with no masses, nodes, nodules or enlargement. Cardiovascular - regular rate and rhythm, S1, S2 normal, no murmur, click, rub or gallop Lungs - chest clear, no wheezing, rales, normal symmetric air entry, Heart exam - S1, S2 normal, no murmur, no gallop, rate regular Abdomen - soft, non-tender; bowel sounds normal; no masses,  no organomegaly Extremities - no joint deformities, effusion, or inflammation, no edema and no skin discoloration  Neurologic Examination: Mental Status:  Alert, disoriented to correct month. Slightly slurred  speech without evidence of aphasia. Able to follow commands without difficulty.  Cranial Nerves:  II-Visual fields were normal.  III/IV/VI-Pupils were equal and reacted. Extraocular movements were full and conjugate.  V/VII-no facial numbness; moderate right lower facial weakness  VIII-normal.  X-slightly dysarthric speech; symmetrical palatal movement.   XII-midline tongue extension  Motor: Moderately severe weakness of left upper extremity proximally and to a slightly lesser extent distally; mild weakness with drift of left lower extremity.  Sensory: Normal throughout.  Deep Tendon Reflexes: 1+ and symmetric.  Plantars: Flexor bilaterally  Cerebellar: Normal finger-to-nose testing and impaired on the left, commensurate with left upper extremity weakness.  Carotid auscultation: Bilateral carotid bruits, right greater than left.   Results for orders placed during the hospital encounter of 02/15/13 (from the past 48 hour(s))  GLUCOSE, CAPILLARY     Status: Abnormal   Collection Time    02/15/13  6:52 PM      Result Value Range   Glucose-Capillary 112 (*) 70 - 99 mg/dL  PROTIME-INR     Status: None   Collection Time    02/15/13  7:43 PM      Result Value Range   Prothrombin Time 14.4  11.6 - 15.2 seconds   INR 1.14  0.00 - 1.49  APTT     Status: Abnormal   Collection Time    02/15/13  7:43 PM      Result Value Range   aPTT 20 (*) 24 - 37 seconds  CBC     Status: Abnormal   Collection Time    02/15/13  7:43 PM      Result Value Range   WBC 18.1 (*) 4.0 - 10.5 K/uL   RBC 4.74  3.87 - 5.11 MIL/uL   Hemoglobin 12.0  12.0 - 15.0 g/dL   HCT 40.9  81.1 - 91.4 %   MCV 77.6 (*) 78.0 - 100.0 fL   MCH 25.3 (*) 26.0 - 34.0 pg   MCHC 32.6  30.0 - 36.0 g/dL   RDW 78.2 (*) 95.6 - 21.3 %   Platelets 284  150 - 400 K/uL  DIFFERENTIAL     Status: Abnormal   Collection Time    02/15/13  7:43 PM      Result Value Range   Neutrophils Relative 85 (*) 43 - 77 %   Neutro Abs 15.3 (*) 1.7 - 7.7 K/uL   Lymphocytes Relative 9 (*) 12 - 46 %   Lymphs Abs 1.6  0.7 - 4.0 K/uL   Monocytes Relative 6  3 - 12 %   Monocytes Absolute 1.1 (*) 0.1 - 1.0 K/uL   Eosinophils Relative 0  0 - 5 %   Eosinophils Absolute 0.0  0.0 - 0.7 K/uL   Basophils Relative 0  0 - 1 %   Basophils Absolute 0.0  0.0 - 0.1 K/uL  COMPREHENSIVE METABOLIC PANEL     Status:  Abnormal   Collection Time    02/15/13  7:43 PM      Result Value Range   Sodium 138  135 - 145 mEq/L   Potassium 3.8  3.5 - 5.1 mEq/L   Chloride 101  96 - 112 mEq/L   CO2 25  19 - 32 mEq/L   Glucose, Bld 104 (*) 70 - 99 mg/dL   BUN 8  6 - 23 mg/dL   Creatinine, Ser 0.86  0.50 - 1.10 mg/dL   Calcium 9.1  8.4 - 57.8 mg/dL   Total Protein 7.6  6.0 - 8.3 g/dL   Albumin 3.2 (*) 3.5 - 5.2 g/dL   AST 18  0 - 37 U/L   ALT 8  0 - 35 U/L   Alkaline Phosphatase 80  39 - 117 U/L   Total Bilirubin 0.2 (*) 0.3 - 1.2 mg/dL   GFR calc non Af Amer >90  >90 mL/min   GFR calc Af Amer >90  >90 mL/min   Comment:            The eGFR has been calculated     using the CKD EPI equation.     This calculation has not been     validated in all clinical     situations.     eGFR's persistently     <90 mL/min signify     possible Chronic Kidney Disease.  TROPONIN I     Status: None   Collection Time    02/15/13  7:44 PM      Result Value Range   Troponin I <0.30  <0.30 ng/mL   Comment:            Due to the release kinetics of cTnI,     a negative result within the first hours     of the onset of symptoms does not rule out     myocardial infarction with certainty.     If myocardial infarction is still suspected,     repeat the test at appropriate intervals.  POCT I-STAT TROPONIN I     Status: None   Collection Time    02/15/13  7:55 PM      Result Value Range   Troponin i, poc 0.02  0.00 - 0.08 ng/mL   Comment 3            Comment: Due to the release kinetics of cTnI,     a negative result within the first hours     of the onset of symptoms does not rule out     myocardial infarction with certainty.     If myocardial infarction is still suspected,     repeat the test at appropriate intervals.  POCT I-STAT, CHEM 8     Status: Abnormal   Collection Time    02/15/13  7:57 PM      Result Value Range   Sodium 138  135 - 145 mEq/L   Potassium 3.7  3.5 - 5.1 mEq/L   Chloride 102  96 - 112 mEq/L    BUN 7  6 - 23 mg/dL   Creatinine, Ser 4.54  0.50 - 1.10 mg/dL   Glucose, Bld 098 (*) 70 - 99 mg/dL   Calcium, Ion 1.19  1.47 - 1.23 mmol/L   TCO2 30  0 - 100 mmol/L   Hemoglobin 13.3  12.0 - 15.0 g/dL   HCT 82.9  56.2 - 13.0 %   Ct Angio Head W/cm &/or Wo Cm  02/15/2013  *RADIOLOGY REPORT*  Clinical Data:  Acute onset left-sided weakness and confusion. Takayasu arteritis  CT ANGIOGRAPHY HEAD AND NECK  Technique:  Multidetector CT imaging of the head and neck was performed using the standard protocol during bolus administration of intravenous contrast.  Multiplanar CT image reconstructions including MIPs were obtained to evaluate the vascular anatomy. Carotid stenosis measurements (when applicable) are obtained utilizing NASCET criteria, using the distal internal carotid diameter as the denominator.  Contrast: OMNIPAQUE IOHEXOL 350 MG/ML SOLN  Comparison:  MRI 12/05/2012  CTA NECK  Findings:  There  is marked thickening of the aortic arch compatible with arteritis.  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  There is a filling defect in the innominate artery which appears to be thrombus.  The right innominate artery is patent.  The right common carotid artery is patent.  The carotid bifurcation is patent on the right.  There is occlusion of the right cavernous carotid artery which was widely patent on 12/05/2012. There is occlusion of most of the right M1 segment with some patent flow in the midportion.  There is flow in the right middle cerebral artery branches.  The findings suggest an embolus from the right innominate artery and to the right cavernous carotid and right M1 segment.   Review of the MIP images confirms the above findings.  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  Filling defect in the innominate artery may be due to fresh thrombus.  The right carotid bifurcation is widely patent.  There is occlusion of the right cavernous carotid and right M1 segment suggesting an embolus.  There is flow and right middle cerebral artery branches.  CTA HEAD  Findings:  Subtle hypodensity in the right insula compatible with acute infarct.  No associated hemorrhage. 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  Ventricles are not enlarged.  No midline shift.  Small area of chronic infarct left medial occipital lobe and left thalamus. 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  This vessel was patent on 12/05/2012.  There is partial occlusion of the right middle cerebral artery M1 segment.  There is fairly good flow in the right middle cerebral artery branches.  The right A1 segment and A2 segments are patent without filling defect.  The left internal carotid artery is occluded with reconstitution at the skull base via collaterals.  The left A1 segment and left anterior cerebral arteries are patent.  Left middle cerebral artery M1 and branches are patent.  Both vertebral arteries are patent to the basilar.  Small left vertebral reconstitutes in the mid neck.  The basilar is patent. Superior cerebellar and posterior cerebral arteries are patent bilaterally.  Negative for cerebral aneurysm.   Review of the MIP images confirms the above findings.  IMPRESSION: CT indicates  low density edema in the right insula suggestive of acute infarct.  Occlusion of the right cavernous carotid and right M1 segment suggestive of acute embolus.  There is a filling defect in the innominate artery which may be the source of embolus.  Chronic occlusion left carotid artery with reconstitution at the skull base and good perfusion of left M1 and A1 segments on the left.  Small left vertebral artery which is occluded proximally  but reconstitutes in the mid neck.  I discussed the findings by telephone with Dr. Corliss Skains  at 2127 hours and with Dr. Noel Christmas at 2145 hours.   Original Report Authenticated By: Janeece Riggers, M.D.    Ct Angio Neck W/cm &/or Wo/cm  02/15/2013  *RADIOLOGY REPORT*  Clinical Data:  Acute onset left-sided weakness and confusion. Takayasu arteritis  CT ANGIOGRAPHY HEAD AND NECK  Technique:  Multidetector CT imaging of the head and neck was performed using the standard protocol during bolus administration of  intravenous contrast.  Multiplanar CT image reconstructions including MIPs were obtained to evaluate the vascular anatomy. Carotid stenosis measurements (when applicable) are obtained utilizing NASCET criteria, using the distal internal carotid diameter as the denominator.  Contrast: OMNIPAQUE IOHEXOL 350 MG/ML SOLN  Comparison:  MRI 12/05/2012  CTA NECK  Findings:  There is marked thickening of the aortic arch compatible with arteritis.  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  There is a filling defect in the innominate artery which appears to be thrombus.  The right innominate artery is patent.  The right common carotid artery is patent.  The carotid bifurcation is patent on the right.  There is occlusion of the right cavernous carotid artery which was widely patent on 12/05/2012. There is occlusion of most of the right M1 segment with some patent flow in the midportion.  There is flow in the right middle cerebral artery branches.  The findings suggest an embolus from the right innominate artery and to the right cavernous carotid and right M1 segment.   Review of the MIP images confirms the above findings.  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  Filling defect in the innominate artery may be due to fresh thrombus.  The right carotid bifurcation is widely patent.  There is occlusion of the right cavernous carotid and right M1 segment suggesting an embolus.  There is flow and right middle cerebral artery branches.  CTA HEAD  Findings:  Subtle hypodensity in the right insula compatible with acute infarct.  No associated hemorrhage. 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  Ventricles are not enlarged.  No midline shift.  Small area of chronic infarct left medial occipital lobe and left thalamus. 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  This vessel was patent on 12/05/2012.  There is partial occlusion of the right middle cerebral artery M1 segment.  There is fairly good flow in the right middle cerebral artery branches.  The right A1 segment and A2 segments are patent without filling defect.  The left internal carotid artery is occluded with reconstitution at the skull base via collaterals.  The left A1 segment and left anterior cerebral arteries are patent.  Left middle cerebral artery M1 and branches are patent.  Both vertebral arteries are patent to the basilar.  Small left vertebral reconstitutes in the mid neck.  The basilar is patent. Superior cerebellar and posterior cerebral arteries are patent bilaterally.  Negative for cerebral aneurysm.   Review of the MIP images confirms the above findings.  IMPRESSION: CT indicates  low density edema in the right insula suggestive of acute infarct.  Occlusion of the right cavernous carotid and right M1 segment suggestive of acute embolus.  There is a filling defect in the innominate artery which may be the source of embolus.  Chronic occlusion left carotid artery with reconstitution at the skull base and good perfusion of left M1 and A1 segments on the left.  Small left vertebral artery which is occluded proximally but reconstitutes in the mid  neck.  I discussed the findings by telephone with Dr. Corliss Skains  at 2127 hours and with Dr. Noel Christmas at 2145 hours.   Original Report Authenticated By: Onalee Hua  Chestine Spore, M.D.    Ct Cerebral Perfusion W/cm  02/15/2013  *RADIOLOGY REPORT*  Clinical Data:  Takayasu  arteritis.  Acute onset left-sided weakness and confusion.  CT CEREBRAL PERFUSION WITH CONTRAST  Contrast: OMNIPAQUE IOHEXOL 350 MG/ML SOLN  Comparison: MRI 12/05/2012  Findings: Decreased perfusion in the right MCA territory.  This involves the insula as well as the temporal parietal lobe.  There is decreased cerebral blood volume and decreased cerebral blood flow.  There is increased mean transit time.  Based on quantitative ROI assessment, much this territory may be at risk for infarction but not a complete infarction.  Cerebral blood flow is above 12 ml per 100 grams per minute.  Cerebral blood volume is greater than 2.5 ml per 100 grams.  IMPRESSION: Acute infarct right MCA territory.  Findings suggest majority of this may be penumbra and could potentially be reversible.   Original Report Authenticated By: Janeece Riggers, M.D.     Assessment: 37 y.o. female history of Takayasu's's arteritis and previous stroke presenting with new right MCA territory stroke as well as new thrombus formation involving the innominate artery with likely emboli to the right internal carotid and M1 branch of right MCA. CT shows a mass of the area of ischemia in the right MCA territory is likely penumbra and potentially reversible. Patient's INR was subtherapeutic (1.14).  Stroke Risk Factors - carotid stenosis  Plan:l  1. Interventional radiology for cerebral angiogram and possible thrombectomy  2. Admission to neuro intensive care unit following interventional radiology procedures  3. PT consult, OT consult, Speech consult  4. Prophylactic therapy-Anticoagulation: To be determined by stroke team  5. Risk factor modification  6 Telemetry monitoring    Patient's evaluation and management required complex diagnostic studies as well as complex decision making and consultation with patient and family regarding treatment options. Total critical care time was 90 minutes.  C.R. Roseanne Reno, MD [facility] [phone]  02/16/2013, 12:22 AM

## 2013-02-16 NOTE — Progress Notes (Signed)
PULMONARY  / CRITICAL CARE MEDICINE  Name: [name] MRN: [id number] DOB: Dec 23, 1975    ADMISSION DATE:  02/15/2013 CONSULTATION DATE:  02/17/12  REFERRING MD :  Noel Christmas, Neurology  CHIEF COMPLAINT:  Vent management  BRIEF PATIENT DESCRIPTION:  37 yo with h/o Takayasu's arteritis and stroke a/w with acute stroke in R MCA distribution s/p carotid arteriogram with partial recanalization of the distal R ICA.  Remained on vent post-procedure and PCCM consulted.  SIGNIFICANT EVENTS / STUDIES:  3/24 R ICA arteriogram with partial recanalization  LINES / TUBES: R and L arterial sheath 3/24 >>>  SUBJECTIVE:  Tolerates pressure support.  Remains on propofol.    VITAL SIGNS: Temp:  [94.6 F (34.8 C)-98.7 F (37.1 C)] 97.4 F (36.3 C) (03/24 0800) Pulse Rate:  [62-132] 94 (03/24 0822) Resp:  [10-29] 12 (03/24 0822) BP: (80-178)/(40-114) 158/82 mmHg (03/24 0822) SpO2:  [96 %-100 %] 100 % (03/24 0822) Arterial Line BP: (95-212)/(55-102) 126/82 mmHg (03/24 0800) FiO2 (%):  [30 %-40 %] 40 % (03/24 0822) Weight:  [143 lb (64.864 kg)-153 lb 3.5 oz (69.5 kg)] 153 lb 3.5 oz (69.5 kg) (03/24 0315) VENTILATOR SETTINGS: Vent Mode:  [-] PRVC FiO2 (%):  [30 %-40 %] 40 % Set Rate:  [10 bmp-14 bmp] 10 bmp Vt Set:  [450 mL-510 mL] 450 mL PEEP:  [5 cmH20] 5 cmH20 Plateau Pressure:  [16 cmH20-17 cmH20] 16 cmH20 INTAKE / OUTPUT: Intake/Output     03/23 0701 - 03/24 0700 03/24 0701 - 03/25 0700   I.V. (mL/kg) 1571.8 (22.6) 89 (1.3)   NG/GT 100    Total Intake(mL/kg) 1671.8 (24.1) 89 (1.3)   Urine (mL/kg/hr) 1650    Total Output 1650     Net +21.8 +89          PHYSICAL EXAMINATION: General: Agitated with WUA Neuro: Sedated, non following commands, moves Rt side, moves Lt shoulder HEENT: ETT in place Cardiovascular: regular, tachycardic Lungs: no wheeze Abdomen: soft, non tender Musculoskeletal: no edema Skin:  No rash  LABS:  Recent Labs Lab 02/15/13 1943 02/15/13 1944  02/15/13 1957 02/16/13 0420 02/16/13 0500 02/16/13 0600  HGB 12.0  --  13.3  --  10.6*  --   WBC 18.1*  --   --   --  12.7*  --   PLT 284  --   --   --  295  --   NA 138  --  138  --  137  --   K 3.8  --  3.7  --  3.1*  --   CL 101  --  102  --  104  --   CO2 25  --   --   --  23  --   GLUCOSE 104*  --  111*  --  122*  --   BUN 8  --  7  --  5*  --   CREATININE 0.70  --  0.80  --  0.57  --   CALCIUM 9.1  --   --   --  8.1*  --   AST 18  --   --   --   --   --   ALT 8  --   --   --   --   --   ALKPHOS 80  --   --   --   --   --   BILITOT 0.2*  --   --   --   --   --  PROT 7.6  --   --   --   --   --   ALBUMIN 3.2*  --   --   --   --   --   APTT 20*  --   --   --   --   --   INR 1.14  --   --   --   --   --   TROPONINI  --  <0.30  --   --   --   --   PHART  --   --   --  7.560*  --  7.432  PCO2ART  --   --   --  21.2*  --  33.6*  PO2ART  --   --   --  202.0*  --  187.0*    Recent Labs Lab 02/15/13 1852 02/16/13 0430  GLUCAP 112* 98    Imaging: Ct Angio Head W/cm &/or Wo Cm  02/15/2013  *RADIOLOGY REPORT*  Clinical Data:  Acute onset left-sided weakness and confusion. Takayasu arteritis  CT ANGIOGRAPHY HEAD AND NECK  Technique:  Multidetector CT imaging of the head and neck was performed using the standard protocol during bolus administration of intravenous contrast.  Multiplanar CT image reconstructions including MIPs were obtained to evaluate the vascular anatomy. Carotid stenosis measurements (when applicable) are obtained utilizing NASCET criteria, using the distal internal carotid diameter as the denominator.  Contrast: OMNIPAQUE IOHEXOL 350 MG/ML SOLN  Comparison:  MRI 12/05/2012  CTA NECK  Findings:  There is marked thickening of the aortic arch compatible with arteritis.  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  There is a filling defect in the innominate artery which appears to be thrombus.  The right innominate artery is patent.  The right common carotid artery is patent.  The carotid bifurcation is patent on the right.  There is occlusion of the right cavernous carotid artery which was widely patent on 12/05/2012. There is occlusion of most of the right M1 segment with some patent flow in the midportion.  There is flow in the right middle cerebral artery branches.  The findings suggest an embolus from the right innominate artery and to the right cavernous carotid and right M1 segment.   Review of the MIP images confirms the above findings.  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  Filling defect in the innominate artery may be due to fresh thrombus.  The right carotid bifurcation is widely patent.  There is occlusion of the right cavernous carotid and right M1 segment suggesting an embolus.  There is flow and right middle cerebral artery branches.  CTA HEAD  Findings:  Subtle hypodensity in the right insula compatible with acute infarct.  No associated hemorrhage. 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  Ventricles are not enlarged.  No midline shift.  Small area of chronic infarct left medial occipital lobe and left thalamus. 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  This vessel was patent on 12/05/2012.  There is partial occlusion of the right middle cerebral artery M1 segment.  There is fairly good flow in the right middle cerebral artery branches.  The right A1 segment and A2 segments are patent  without filling defect.  The left internal carotid artery is occluded with reconstitution at the skull base via collaterals.  The left A1 segment and left anterior cerebral arteries are patent.  Left middle cerebral artery M1 and branches are patent.   Both vertebral arteries are patent to the basilar.  Small left vertebral reconstitutes in the mid neck.  The basilar is patent. Superior cerebellar and posterior cerebral arteries are patent bilaterally.  Negative for cerebral aneurysm.   Review of the MIP images confirms the above findings.  IMPRESSION: CT indicates  low density edema in the right insula suggestive of acute infarct.  Occlusion of the right cavernous carotid and right M1 segment suggestive of acute embolus.  There is a filling defect in the innominate artery which may be the source of embolus.  Chronic occlusion left carotid artery with reconstitution at the skull base and good perfusion of left M1 and A1 segments on the left.  Small left vertebral artery which is occluded proximally but reconstitutes in the mid neck.  I discussed the findings by telephone with Dr. Corliss Skains  at 2127 hours and with Dr. Noel Christmas at 2145 hours.   Original Report Authenticated By: Janeece Riggers, M.D.    Ct Head Wo Contrast  02/16/2013  *RADIOLOGY REPORT*  Clinical Data: Right CVA.  Post intervention CT.  CT HEAD WITHOUT CONTRAST  Technique:  Contiguous axial images were obtained from the base of the skull through the vertex without contrast.  Comparison: 02/15/2013.  Findings: There is loss of the insular ribbon on the right, with sub insular edema compatible with acute or subacute infarction. High density is present in the intravascular compartment compatible with recent iodinated contrast administration.  The venous sinuses also demonstrate increased attenuation.  Left cerebral hemisphere appears normal.  Posterior fossa structures appear normal.  There may be a tiny amount of contrast staining along the course of the right MCA (image number 11 series 2).  Petechial hemorrhage considered less likely.  Paranasal sinuses appear within normal limits. No midline shift, hydrocephalus or definite evidence of hemorrhage.  IMPRESSION: Low attenuation in the right  middle cerebral artery distribution compatible with acute / subacute infarction.  No definite hemorrhage.  Probable small area of contrast staining in the insula.  Redundant loop of the nasogastric tube is present in the hypopharynx.  Adjustment is recommended.  Finding was called to nurse Barbarann Ehlers at [phone] hours   Original Report Authenticated By: Andreas Newport, M.D.    Ct Angio Neck W/cm &/or Wo/cm  02/15/2013  *RADIOLOGY REPORT*  Clinical Data:  Acute onset left-sided weakness and confusion. Takayasu arteritis  CT ANGIOGRAPHY HEAD AND NECK  Technique:  Multidetector CT imaging of the head and neck was performed using the standard protocol during bolus administration of intravenous contrast.  Multiplanar CT image reconstructions including MIPs were obtained to evaluate the vascular anatomy. Carotid stenosis measurements (when applicable) are obtained utilizing NASCET criteria, using the distal internal carotid diameter as the denominator.  Contrast: OMNIPAQUE IOHEXOL 350 MG/ML SOLN  Comparison:  MRI 12/05/2012  CTA NECK  Findings:  There is marked thickening of the aortic arch compatible with arteritis.  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  There is a filling defect in the innominate artery which appears to be thrombus.  The  right innominate artery is patent.  The right common carotid artery is patent.  The carotid bifurcation is patent on the right.  There is occlusion of the right cavernous carotid artery which was widely patent on 12/05/2012. There is occlusion of most of the right M1 segment with some patent flow in the midportion.  There is flow in the right middle cerebral artery branches.  The findings suggest an embolus from the right innominate artery and to the right cavernous carotid and right M1 segment.    Review of the MIP images confirms the above findings.  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  Filling defect in the innominate artery may be due to fresh thrombus.  The right carotid bifurcation is widely patent.  There is occlusion of the right cavernous carotid and right M1 segment suggesting an embolus.  There is flow and right middle cerebral artery branches.  CTA HEAD  Findings:  Subtle hypodensity in the right insula compatible with acute infarct.  No associated hemorrhage. 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  Ventricles are not enlarged.  No midline shift.  Small area of chronic infarct left medial occipital lobe and left thalamus. 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  This vessel was patent on 12/05/2012.  There is partial occlusion of the right middle cerebral artery M1 segment.  There is fairly good flow in the right middle cerebral artery branches.  The right A1 segment and A2 segments are patent without filling defect.  The left internal carotid artery is occluded with reconstitution at the skull base via collaterals.  The left A1 segment and left anterior cerebral arteries are patent.  Left middle cerebral artery M1 and branches are patent.  Both vertebral arteries are patent to the basilar.  Small left vertebral reconstitutes in the mid neck.  The basilar is patent. Superior cerebellar and posterior cerebral arteries are patent bilaterally.  Negative for cerebral aneurysm.   Review of the MIP images confirms the above findings.  IMPRESSION: CT indicates  low density edema in the right insula suggestive of acute infarct.  Occlusion of the right cavernous carotid and right M1 segment suggestive of acute embolus.  There is a filling defect in the innominate artery which may be the source of embolus.  Chronic occlusion left carotid artery with  reconstitution at the skull base and good perfusion of left M1 and A1 segments on the left.  Small left vertebral artery which is occluded proximally but reconstitutes in the mid neck.  I discussed the findings by telephone with Dr. Corliss Skains  at 2127 hours and with Dr. Noel Christmas at 2145 hours.   Original Report Authenticated By: Janeece Riggers, M.D.    Ct Cerebral Perfusion W/cm  02/15/2013  *RADIOLOGY REPORT*  Clinical Data:  Takayasu  arteritis.  Acute onset left-sided weakness and confusion.  CT CEREBRAL PERFUSION WITH CONTRAST  Contrast: OMNIPAQUE IOHEXOL 350 MG/ML SOLN  Comparison: MRI 12/05/2012  Findings: Decreased perfusion in the right MCA territory.  This involves the insula as well as the temporal parietal lobe.  There is decreased cerebral blood volume and decreased cerebral blood flow.  There is increased mean transit time.  Based on quantitative ROI assessment, much this territory may be at risk for infarction but not a complete infarction.  Cerebral blood flow is above 12 ml per 100 grams per minute.  Cerebral blood volume  is greater than 2.5 ml per 100 grams.  IMPRESSION: Acute infarct right MCA territory.  Findings suggest majority of this may be penumbra and could potentially be reversible.   Original Report Authenticated By: Janeece Riggers, M.D.    Portable Chest Xray  02/16/2013  *RADIOLOGY REPORT*  Clinical Data: Endotracheal tube placement.  PORTABLE CHEST - 1 VIEW  Comparison: 12/12/2011 chest radiograph.  Findings: Nasogastric tube is present with the tip in the stomach. The proximal side port is at the level of the left hemidiaphragm and the tube could be advanced about 3 cm to prevent gastroesophageal reflux.  There is no airspace disease or pleural effusion.  The cardiopericardial silhouette appears within normal limits.  Endotracheal tube tip is 45 mm from the carina. Monitoring leads are projected over the chest.  IMPRESSION:  1.  Endotracheal tube 45 mm from the carina.   Good position. 2.  Nasogastric tube proximal side port at the gastroesophageal junction.  Consider advancing the NG tube without 3 cm to prevent gastroesophageal reflux. 3.  No active cardiopulmonary disease.   Original Report Authenticated By: Andreas Newport, M.D.    Dg Abd Portable 1v  02/16/2013  *RADIOLOGY REPORT*  Clinical Data: Gastric tube placement.  PORTABLE ABDOMEN - 1 VIEW  Comparison: Chest radiograph same day.  Findings: Tip of nasogastric tube is in the gastric fundus.  IVC filter is present.  The right femoral vascular sheath is noted. There is also a smaller bore left femoral vascular sheath.  Bowel gas pattern appears within normal limits.  IMPRESSION: Nasogastric tube tip in the stomach.  See chest radiograph today.   Original Report Authenticated By: Andreas Newport, M.D.       ASSESSMENT / PLAN:  NEUROLOGIC A:  Acute R CVA with h/o CVA P:   - Propofol for sedation  - remove arterial sheaths 3/24 per neuro IR - Post stroke management per Neurology - f/u MRI brain 3/24  PULMONARY A: Respiratory failure post-procedure P:   - Pressure support wean as tolerated >> okay for extubation when neuro status more stable - f/u CXR  CARDIOVASCULAR A: Hypertension with acute stroke. Hx of DVT on chronic coumadin. Sinus tachycardia developed 3/24. P:  - Nicardipine gtt per neuro - lopressor 5 mg IV x 1 on 3/24 for tachycardia - resume anti-coagulation when okay with neuro  RHEUMATOLOGY  A: Hx of Takayasu's arteritis >> on chronic prednisone as outpt. P: - add IV solumedrol 20 mg daily 3/24  RENAL  A: Hypokalemia P: - F/u and replace electrolytes as needed  HEMATOLOGY  A: Anemia of chronic disease P: - F/u CBC intermittently  GASTROINTESTINAL A:  Nutrition P:   - swallow evaluation after extubation  INFECTIOUS DISEASE  A: MRSA swab positive P: - Contact isolation  D/w Dr. Pearlean Brownie  CC time 35 minutes.  Coralyn Helling, MD [facility] Pulmonary/Critical  Care 02/16/2013, 9:25 AM Pager:  [phone] After 3pm call: [phone]

## 2013-02-16 NOTE — Consult Note (Signed)
PULMONARY  / CRITICAL CARE MEDICINE  Name: [name] MRN: [id number] DOB: 1976/05/28    ADMISSION DATE:  02/15/2013 CONSULTATION DATE:  02/17/12  REFERRING MD :  Noel Christmas, Neurology  CHIEF COMPLAINT:  Vent management  BRIEF PATIENT DESCRIPTION: 37 yo with h/o Takayasu's arteritis and stroke a/w with acute stroke in R MCA distribution s/p carotid arteriogram with partial recanalization of the distal R ICA.  SIGNIFICANT EVENTS / STUDIES:  3/24 R ICA arteriogram with partial recanalization  LINES / TUBES: R and L arterial sheath 3/24 >>>  CULTURES: N/A  ANTIBIOTICS: N/A  HISTORY OF PRESENT ILLNESS:  37 yo F with h/o stroke brought to the ED with several day of headache and left facial droop with weakness.  She is on coumadin but INR was subtherapeutic.  CT head suggested area of acute ischemia in R MCA region.  She was not considered for TPA given duration of symptoms. She was did undergo R ICA arteriogram with partial recanalization.  She was intubated for the procedure and brought to the ICU for monitoring afterwards.  PAST MEDICAL HISTORY :  Past Medical History  Diagnosis Date  . PVD (peripheral vascular disease)   . Benign tumor of back   . Swelling, lymph nodes 2006    intermittant, benign  . DVT (deep venous thrombosis)   . Stroke     last week here   Past Surgical History  Procedure Laterality Date  . Ivc filter placement    . Breast surgery      bil breast implants   Prior to Admission medications   Medication Sig Start Date End Date Taking? Authorizing Provider  ALPRAZolam Prudy Feeler) 1 MG tablet Take 1 tablet (1 mg total) by mouth at bedtime as needed for anxiety. 12/07/12  Yes Joseph Art, DO  predniSONE (DELTASONE) 20 MG tablet Take 20 mg by mouth daily.  11/03/12  Yes Hannah Muthersbaugh, PA-C  warfarin (COUMADIN) 7.5 MG tablet Take 3.75 mg by mouth daily.    Yes Historical Provider, MD  zolpidem (AMBIEN) 10 MG tablet Take 1 tablet (10 mg total) by  mouth at bedtime as needed for sleep. 12/07/12  Yes Joseph Art, DO   No Known Allergies  FAMILY HISTORY:  Family History  Problem Relation Age of Onset  . Cancer - Other Father   . Coronary artery disease Mother   . Stroke Mother   . Hypertension Mother   . Hypertension Sister    SOCIAL HISTORY:  reports that she has never smoked. She has never used smokeless tobacco. She reports that  drinks alcohol. She reports that she does not use illicit drugs.  REVIEW OF SYSTEMS:  Unable to obtain as patient is sedated and intubated  SUBJECTIVE:   VITAL SIGNS: Temp:  [98.4 F (36.9 C)-98.7 F (37.1 C)] 98.7 F (37.1 C) (03/23 2021) Pulse Rate:  [62-103] 71 (03/24 0010) Resp:  [10-25] 14 (03/24 0010) BP: (118-178)/(57-91) 138/70 mmHg (03/24 0010) SpO2:  [98 %-100 %] 100 % (03/24 0010) FiO2 (%):  [40 %] 40 % (03/24 0302) Weight:  [64.864 kg (143 lb)] 64.864 kg (143 lb) (03/24 0110) HEMODYNAMICS:   VENTILATOR SETTINGS: Vent Mode:  [-] PRVC FiO2 (%):  [40 %] 40 % Set Rate:  [14 bmp] 14 bmp Vt Set:  [510 mL] 510 mL PEEP:  [5 cmH20] 5 cmH20 Plateau Pressure:  [17 cmH20] 17 cmH20 INTAKE / OUTPUT: Intake/Output     03/23 0701 - 03/24 0700   I.V. (mL/kg)  1100 (17)   Total Intake(mL/kg) 1100 (17)   Urine (mL/kg/hr) 400   Total Output 400   Net +700         PHYSICAL EXAMINATION: General:  Lying in bet, intubated and sedated Neuro:  Sedated, semi purposeful movements of R>L extremities HEENT:  Pupils pinpoint with slight reaction Cardiovascular: RRR Lungs:  CTAB Abdomen: soft, NT, ND Musculoskeletal:  Joints wnl Skin:  No rash  LABS:  Recent Labs Lab 02/15/13 1943 02/15/13 1944 02/15/13 1957  HGB 12.0  --  13.3  WBC 18.1*  --   --   PLT 284  --   --   NA 138  --  138  K 3.8  --  3.7  CL 101  --  102  CO2 25  --   --   GLUCOSE 104*  --  111*  BUN 8  --  7  CREATININE 0.70  --  0.80  CALCIUM 9.1  --   --   AST 18  --   --   ALT 8  --   --   ALKPHOS 80  --    --   BILITOT 0.2*  --   --   PROT 7.6  --   --   ALBUMIN 3.2*  --   --   APTT 20*  --   --   INR 1.14  --   --   TROPONINI  --  <0.30  --     Recent Labs Lab 02/15/13 1852  GLUCAP 112*    CXR: No pulmonary disease. ETT ~ 4 cm from carina.  CT Head (post procedure) Low attenuation in the right middle cerebral artery distribution  compatible with acute / subacute infarction. No definite  hemorrhage. Probable small area of contrast staining in the  insula.    ASSESSMENT / PLAN:  PULMONARY A: Respiratory failure post-procedure P:   - Check ABG - Cont vent support - Hold propofol and eval for extubation in the AM.  CARDIOVASCULAR A: Hypertension with acute stroke P:  - Nicardipine gtt, wean as tolerated   GASTROINTESTINAL A:  No issues P:   - Hold tube feeds for possible quick extubation - Will need swallow eval prior to starting diet.   NEUROLOGIC A:  Acute R CVA with h/o CVA P:   - S/p VIR procedure - Propofol for sedation - Post stroke management per Neurology.  TODAY'S SUMMARY: Vent support overnight following VIR R ICA arteriogram with intervention  I have personally obtained a history, examined the patient, evaluated laboratory and imaging results, formulated the assessment and plan and placed orders. CRITICAL CARE: The patient is critically ill with multiple organ systems failure and requires high complexity decision making for assessment and support, frequent evaluation and titration of therapies, application of advanced monitoring technologies and extensive interpretation of multiple databases. Critical Care Time devoted to patient care services described in this note is 45 minutes.   Calden Dorsey M.D. Pulmonary and Critical Care Medicine [facility] Pager: [phone]  02/16/2013, 4:20 AM

## 2013-02-16 NOTE — Progress Notes (Signed)
4 fr LFA sheath removed at 1000.  Manual pressure with hemostasis pad used.  Hemostasis obtained at 1010.  No hematoma noted in Lt Groin.  Tegaderm with gauze.   Ophia Shamoon RT R CV 9 Fr RFA sheath removed at 955 by Magda Bernheim RT R. Manual pressure with hemostasis pad used.  Hemostasis obtained at 1020.  While reviewing site with RN, re-bleed occurred.  Manual pressure resumed.  Distal pulses intact bilaterally. Hemostasis obtained in Rt Groin at 1035.  Groin reviewed with Katie RN.   Upon arrival, PT HOB 30 degrees.  Pt very active, moving both legs, and bending right knee.  IR staff requested additional sedation along with restraints.  10 lb sandbag and pressure dressing applied to Rt groin.  Shella Lahman RT R CV [employer] RT R

## 2013-02-17 ENCOUNTER — Inpatient Hospital Stay (HOSPITAL_COMMUNITY): Payer: Medicare Other

## 2013-02-17 LAB — BASIC METABOLIC PANEL
BUN: 6 mg/dL (ref 6–23)
Chloride: 107 mEq/L (ref 96–112)
GFR calc non Af Amer: >90 mL/min (ref >90)
Glucose, Bld: 106 mg/dL — ABNORMAL HIGH (ref 70–99)
Potassium: 3.7 mEq/L (ref 3.5–5.1)

## 2013-02-17 LAB — CBC
HCT: 28.3 % — ABNORMAL LOW (ref 36.0–46.0)
Hemoglobin: 9 g/dL — ABNORMAL LOW (ref 12.0–15.0)
MCH: 24.4 pg — ABNORMAL LOW (ref 26.0–34.0)
MCHC: 31.8 g/dL (ref 30.0–36.0)
MCV: 76.7 fL — ABNORMAL LOW (ref 78.0–100.0)

## 2013-02-17 LAB — GLUCOSE, CAPILLARY
Glucose-Capillary: 105 mg/dL — ABNORMAL HIGH (ref 70–99)
Glucose-Capillary: 115 mg/dL — ABNORMAL HIGH (ref 70–99)
Glucose-Capillary: 114 mg/dL — ABNORMAL HIGH (ref 70–99)

## 2013-02-17 MED ORDER — PREDNISONE 5 MG/5ML PO SOLN
20.0000 mg | Freq: Every day | ORAL | Status: DC
  Filled 2013-02-17 (×2): qty 20

## 2013-02-17 MED ORDER — PANTOPRAZOLE SODIUM 40 MG PO PACK
40.0000 mg | PACK | ORAL | Status: DC
  Filled 2013-02-17: qty 20

## 2013-02-17 MED ORDER — ASPIRIN 325 MG PO TABS
325.0000 mg | ORAL_TABLET | Freq: Every day | ORAL | Status: DC
  Filled 2013-02-17: qty 1

## 2013-02-17 MED ORDER — SODIUM CHLORIDE 0.9 % IV BOLUS (SEPSIS)
500.0000 mL | Freq: Once | INTRAVENOUS | Status: AC
  Administered 2013-02-17: 500 mL via INTRAVENOUS

## 2013-02-17 MED ORDER — FENTANYL CITRATE 0.05 MG/ML IJ SOLN
25.0000 ug | INTRAMUSCULAR | Status: DC | PRN
  Administered 2013-02-17 (×2): 50 ug via INTRAVENOUS
  Administered 2013-02-18: 25 ug via INTRAVENOUS
  Filled 2013-02-17 (×3): qty 2

## 2013-02-17 MED ORDER — METHYLPREDNISOLONE SODIUM SUCC 40 MG IJ SOLR
20.0000 mg | Freq: Every day | INTRAMUSCULAR | Status: DC
  Administered 2013-02-17 – 2013-02-18 (×2): 20 mg via INTRAVENOUS
  Filled 2013-02-17 (×2): qty 0.5

## 2013-02-17 MED ORDER — HYDROCORTISONE SOD SUCCINATE 100 MG IJ SOLR
100.0000 mg | Freq: Once | INTRAMUSCULAR | Status: AC
  Administered 2013-02-17: 100 mg via INTRAVENOUS
  Filled 2013-02-17: qty 2

## 2013-02-17 MED ORDER — ASPIRIN 300 MG RE SUPP
300.0000 mg | Freq: Every day | RECTAL | Status: DC
  Administered 2013-02-17: 300 mg via RECTAL
  Filled 2013-02-17 (×4): qty 1

## 2013-02-17 MED ORDER — PANTOPRAZOLE SODIUM 40 MG IV SOLR
40.0000 mg | Freq: Every day | INTRAVENOUS | Status: DC
  Administered 2013-02-17: 40 mg via INTRAVENOUS
  Filled 2013-02-17 (×2): qty 40

## 2013-02-17 MED ORDER — ASPIRIN 325 MG PO TABS
325.0000 mg | ORAL_TABLET | Freq: Every day | ORAL | Status: DC
  Administered 2013-02-18 – 2013-02-19 (×2): 325 mg via ORAL
  Filled 2013-02-17 (×4): qty 1

## 2013-02-17 NOTE — Progress Notes (Signed)
eLink Physician-Brief Progress Note Patient Name: [name] DOB: 03/27/1976 MRN: [id number]  Date of Service  02/17/2013   HPI/Events of Note  Patient remains intubated s/p R MCA stroke and removal of thrombus.  Is maxed out on propofol for sedation and continued to move non-affected side.  Does not follow commands.  Is tachycardic and hypertensive on cardene gtt.   eICU Interventions  Plan: Suspect some of these findings are related to inadequate sedation/pain control - Order for fentanyl 25 to 100 mcg IV q2 hours prn. Will continue to monitor.   Intervention Category Minor Interventions: Routine modifications to care plan (e.g. PRN medications for pain, fever)  DETERDING,ELIZABETH 02/17/2013, 2:54 AM

## 2013-02-17 NOTE — Progress Notes (Signed)
Stroke Team Progress Note  HISTORY [name] is an 37 y.o. 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 Onset is unclear. Deficits were noted early this afternoon 02/15/2013 when the mother went over to check on her because of apparent confusion described by neighbors. Patient has been on anticoagulation with Coumadin and has a history of less than ideal compliance. She states that she's been taking Coumadin. However, INR today was 1.14. No speech changes of the noted by the patient nor patient's mother. CT scan of the head showed an area of acute ischemia involving the right MCA territory, the majority of which appear to be penumbra and potentially reversible. 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 Filling defect involving the innominate artery was noted and thought to likely represent thrombus. The innominate artery was patent along with right common carotid artery. There was also occlusion of the right cavernous carotid and right M1 segment indicative embolic phenomena. NIH stroke scale was 8. Patient was not a TPA candidate secondary to delay in arrival as well as stroke within the past 3 months. She was admitted to the neuro ICU for further evaluation and treatment.  SUBJECTIVE Family at the bedside. Pt off sedation and wants extubation.  OBJECTIVE Most recent Vital Signs: Filed Vitals:   02/17/13 0600 02/17/13 0700 02/17/13 0751 02/17/13 0800  BP: 91/69 86/64 87/72  96/77  Pulse: 78 76 93 94  Temp:      TempSrc:      Resp: 11 11 15 15   Height:      Weight:      SpO2: 100% 100% 100% 100%   CBG (last 3)   Recent Labs  02/16/13 1933 02/17/13 0017 02/17/13 0352  GLUCAP 71 89 98   IV Fluid Intake:   . sodium chloride 50 mL/hr at 02/17/13 0824  . niCARDipine  Stopped (02/16/13 0500)  . propofol 10 mcg/kg/min (02/17/13 0755)   MEDICATIONS  . antiseptic oral rinse  15 mL Mouth Rinse QID  . aspirin  325 mg Oral Daily  . chlorhexidine  15 mL Mouth Rinse BID  . Chlorhexidine Gluconate Cloth  6 each Topical Q0600  . insulin aspart  0-15 Units Subcutaneous Q4H  . mupirocin ointment  1 application Nasal BID  . pantoprazole sodium  40 mg Per Tube Q24H  . predniSONE  20 mg Per Tube Q breakfast   PRN:  acetaminophen, acetaminophen, fentaNYL, ondansetron (ZOFRAN) IV  Diet:  NPO  Activity:  Bedrest DVT Prophylaxis:  SCDs   CLINICALLY SIGNIFICANT STUDIES Basic Metabolic Panel:   Recent Labs Lab 02/16/13 0500 02/17/13 0350  NA 137 139  K 3.1* 3.7  CL 104 107  CO2 23 21  GLUCOSE 122* 106*  BUN 5* 6  CREATININE 0.57 0.76  CALCIUM 8.1* 8.3*   Liver Function Tests:   Recent Labs Lab 02/15/13 1943  AST 18  ALT 8  ALKPHOS 80  BILITOT 0.2*  PROT 7.6  ALBUMIN 3.2*   CBC:   Recent Labs Lab 02/15/13 1943  02/16/13 0500 02/17/13 0350  WBC 18.1*  --  12.7* 16.0*  NEUTROABS 15.3*  --  9.4*  --   HGB 12.0  < > 10.6* 9.0*  HCT 36.8  < > 31.7* 28.3*  MCV 77.6*  --  75.8* 76.7*  PLT 284  --  295 275  < > = values in this interval not displayed. Coagulation:   Recent Labs Lab 02/15/13 1943  LABPROT 14.4  INR 1.14   Cardiac Enzymes:   Recent Labs Lab 02/15/13 1944  TROPONINI <0.30   Urinalysis: No results found for this basename: COLORURINE, APPERANCEUR, LABSPEC, PHURINE, GLUCOSEU, HGBUR, BILIRUBINUR, KETONESUR, PROTEINUR, UROBILINOGEN, NITRITE, LEUKOCYTESUR,  in the last 168 hours Lipid Panel    Component Value Date/Time   CHOL 151 02/16/2013 0500   TRIG 166* 02/16/2013 0500   HDL 60 02/16/2013 0500   CHOLHDL 2.5 02/16/2013 0500   VLDL 33 02/16/2013 0500   LDLCALC 58 02/16/2013 0500   HgbA1C  Lab Results  Component Value Date   HGBA1C 6.0* 02/16/2013   Urine Drug Screen:   No results found for this basename: labopia,   cocainscrnur,  labbenz,  amphetmu,  thcu,  labbarb    Alcohol Level: No results found for this basename: ETH,  in the last 168 hours  CT of the brain  02/16/2013  Low attenuation in the right middle cerebral artery distribution compatible with acute / subacute infarction.  No definite hemorrhage.  Probable small area of contrast staining in the insula.  Redundant loop of the nasogastric tube is present in the hypopharynx.  Adjustment is recommended.     Ct Angio Head W/cm &/or Wo Cm 02/15/2013  CT indicates  low density edema in the right insula suggestive of acute infarct.  Occlusion of the right cavernous carotid and right M1 segment suggestive of acute embolus.  There is a filling defect in the innominate artery which may be the source of embolus.  Chronic occlusion left carotid artery with reconstitution at the skull base and good perfusion of left M1 and A1 segments on the left.  Small left vertebral artery which is occluded proximally but reconstitutes in the mid neck.    CT Angio Neck W/cm &/or Wo/cm 02/15/2013 Chronic occlusion of the left carotid artery, left subclavian artery, and left vertebral artery as seen on prior studies due to arteritis.  Filling defect in the innominate artery may be due to fresh thrombus.  The right carotid bifurcation is widely patent.  There is occlusion of the right cavernous carotid and right M1 segment suggesting an embolus.  There is flow and right middle cerebral artery branches.    CT Cerebral Perfusion W/cm 02/15/2013  Acute infarct right MCA territory.  Findings suggest majority of this may be penumbra and could potentially be reversible.    Cerebral Angiogrampartial recanalization of occluded distal RT ICA Using trevoprovue thrombectomy. device.  MRI of the brain    MRA of the brain    2D Echocardiogram    Carotid Doppler  Right: No evidence of hemodynamically significant internal carotid artery stenosis. Left: CCA appears occluded. The ECA appears to be  feeding the ICA. Vertebral artery appears occluded.   CXR  02/17/2013 1. Stable support apparatus. 2. Mild right basilar atelectasis.  02/16/2013   1.  Endotracheal tube 45 mm from the carina.  Good position. 2.  Nasogastric tube proximal side port at the gastroesophageal junction.  Consider advancing the NG tube without 3 cm to prevent gastroesophageal reflux. 3.  No active cardiopulmonary disease.   EKG  normal sinus rhythm.   Therapy Recommendations   Physical Exam   Patient off Propofol drip.intubated. Afebrile. Head is nontraumatic. Neck is supple without bruit.  Cardiac exam no murmur or gallop. Lungs are clear to auscultation. Distal pulses are well felt.bilateral groin  arterial sheaths present.  Neurological Exam :restless.Eyes are open Right gaze deviation. Doll's eye movements absent to the left. Pupils 4 mm equal reactive. Corneal reflexes are present. Has weak cough and gag. Left lower facial weakness. Tongue is midline. She has purposeful and localizing movements on the right side to sternal rub and noxious stimuli. No left upper extremity movement to pain. Partial left lower extremity withdrawal to painful stimuli. Left plantar is upgoing. Right is downgoing.  ASSESSMENT [name] is a 37 y.o. female presenting with confusion,  headache for several days and new onset left facial and extremity weakness. Imaging confirms a right MCA territory infarct in the setting of Takayasu's's arteritis and hypercoagulability from lupus anticoagulant. MRI pending. Infarct felt to be  thrombotic with occlusion of the right cavernous carotid and right M1 segment. On warfarin prior to admission; subtherapeutic INR on arrival 1.14 Now on aspirin suppository for secondary stroke prevention. Patient with resultant VDRF, left hemiparesis. Work up underway.   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 Urine cx neg. CXR R atx.  Microcytic anemia, Hgb 9  Hypertension, placed on cardene drip, discontinued during the night. Highest documented BY 178/77  MRSA positive nasal swab, on isolation  LDL 58  HgbA1c 6.0  Hospital day # 2  TREATMENT/PLAN  Continue aspirin suppository for secondary stroke prevention. Consider anticoagulation in the future.  Ok for extubation now  After extubation, MRI, MRA  After extubation, check swallow, therapy evals  Annie Main, MSN, RN, ANVP-BC, ANP-BC, GNP-BC [facility] Pager: [phone] 02/17/2013 8:59 AM  I have personally obtained a history, examined the patient, evaluated imaging results, and formulated the assessment and plan of care. I agree with the above. This patient is critically ill and at significant risk of neurological worsening, death and care requires constant monitoring of vital signs, hemodynamics,respiratory and cardiac monitoring,review of multiple databases, neurological assessment, discussion with family, other specialists and medical decision making of high complexity. I spent 33 minutes of neurocritical care time  in the care of  this patient.  Delia Heady, MD

## 2013-02-17 NOTE — Progress Notes (Signed)
SLP Cancellation Note  Patient Details Name: [name] MRN: [id number] DOB: 05-02-1976   Cancelled treatment:       Reason Eval/Treat Not Completed: Medical issues which prohibited therapy. Patient on vent, will f/u went medically ready  Berdine Dance SLP student Berdine Dance 02/17/2013, 8:04 AM

## 2013-02-17 NOTE — Progress Notes (Signed)
UR completed 

## 2013-02-17 NOTE — Progress Notes (Signed)
OT PT Cancellation Note  Patient Details Name: [name] MRN: [id number] DOB: 1976/08/27   Cancelled Treatment:    Reason Eval/Treat Not Completed: Patient not medically ready (currently on strict bedrest)  Lucile Shutters Pager: [phone]  02/17/2013, 7:57 AM

## 2013-02-17 NOTE — Progress Notes (Signed)
SLP reviewed and agree with student findings.   Lucy Boardman MA, CCC-SLP [phone]    

## 2013-02-17 NOTE — Progress Notes (Signed)
NUTRITION FOLLOW UP  Intervention:   - Diet advancement per MD - RD to continue to monitor   Nutrition Dx:   Inadequate oral intake related to inability to eat as evidenced by NPO status. - ongoing   Goal:   Pt to meet >/= 90% of estimated needs - unmet   Monitor:   Toleration of diet advancement, po intake, weight trends   Assessment:   Pt is 37 yo female with a hx of Takayasu's arteritis, stroke in January 2014 and deep vein thrombosis, presenting with complaint of headache for several days and apparent new onset of left facial and extremity weakness. Onset is unclear. Pt underwent right ICA arteriogram with partial recanalization. She was intubated for the procedure and brought to the ICU for monitoring afterwards.   3/25 Pt extubated this morning.    Height: Ht Readings from Last 1 Encounters:  02/16/13 5\' 8"  (1.727 m)    Weight Status:   Wt Readings from Last 1 Encounters:  02/16/13 153 lb 3.5 oz (69.5 kg)    Re-estimated needs:  Kcal: 1700 - 1900  Protein: 90-105 gm  Fluid: >1.5 L   Skin:   Diet Order: NPO   Intake/Output Summary (Last 24 hours) at 02/17/13 1105 Last data filed at 02/17/13 0900  Gross per 24 hour  Intake 2044.38 ml  Output   1400 ml  Net 644.38 ml    Last BM: none documented    Labs:   Recent Labs Lab 02/15/13 1943 02/15/13 1957 02/16/13 0500 02/17/13 0350  NA 138 138 137 139  K 3.8 3.7 3.1* 3.7  CL 101 102 104 107  CO2 25  --  23 21  BUN 8 7 5* 6  CREATININE 0.70 0.80 0.57 0.76  CALCIUM 9.1  --  8.1* 8.3*  GLUCOSE 104* 111* 122* 106*    CBG (last 3)   Recent Labs  02/17/13 0017 02/17/13 0352 02/17/13 0804  GLUCAP 89 98 99    Scheduled Meds: . antiseptic oral rinse  15 mL Mouth Rinse QID  . aspirin  325 mg Oral Daily   Or  . aspirin  300 mg Rectal Daily  . chlorhexidine  15 mL Mouth Rinse BID  . Chlorhexidine Gluconate Cloth  6 each Topical Q0600  . insulin aspart  0-15 Units Subcutaneous Q4H  .  methylPREDNISolone (SOLU-MEDROL) injection  20 mg Intravenous Daily  . mupirocin ointment  1 application Nasal BID  . pantoprazole (PROTONIX) IV  40 mg Intravenous QHS    Continuous Infusions: . sodium chloride 50 mL/hr at 02/17/13 0900  . niCARDipine Stopped (02/16/13 0500)    Belenda Cruise  Dietetic Intern Pager: [phone]    Student note/chart reviewed. Kendell Bane RD, LDN, CNSC [phone] Pager [phone] After Hours Pager

## 2013-02-17 NOTE — Progress Notes (Signed)
Subjective: R ICA thrombectomy 3/24 with Dr Corliss Skains Extubated Responds slightly  Objective: Vital signs in last 24 hours: Temp:  [98.3 F (36.8 C)-98.7 F (37.1 C)] 98.3 F (36.8 C) (03/25 0357) Pulse Rate:  [76-132] 82 (03/25 1100) Resp:  [0-29] 16 (03/25 1100) BP: (73-167)/(47-113) 89/66 mmHg (03/25 1100) SpO2:  [97 %-100 %] 100 % (03/25 1100) FiO2 (%):  [39.1 %-40.5 %] 40.1 % (03/25 0900)    Intake/Output from previous day: 03/24 0701 - 03/25 0700 In: 2201 [I.V.:2201] Out: 1800 [Urine:1800] Intake/Output this shift: Total I/O In: 324 [I.V.:324] Out: -   PE:  afeb UOP 1.8 L yellow 80-90s/60s She will squeeze rt hand well to command Moves rt leg and foot to command well Only withdraws from pain on left arm/foot B groins are NT; soft; no bleeding; no hematoma 1+ pulses feet Will not look AT me Will not open mouth  Lab Results:   Recent Labs  02/16/13 0500 02/17/13 0350  WBC 12.7* 16.0*  HGB 10.6* 9.0*  HCT 31.7* 28.3*  PLT 295 275   BMET  Recent Labs  02/16/13 0500 02/17/13 0350  NA 137 139  K 3.1* 3.7  CL 104 107  CO2 23 21  GLUCOSE 122* 106*  BUN 5* 6  CREATININE 0.57 0.76  CALCIUM 8.1* 8.3*   PT/INR  Recent Labs  02/15/13 1943  LABPROT 14.4  INR 1.14   ABG  Recent Labs  02/16/13 0420 02/16/13 0600  PHART 7.560* 7.432  HCO3 19.4* 22.5    Studies/Results: Ct Angio Head W/cm &/or Wo Cm  02/15/2013  *RADIOLOGY REPORT*  Clinical Data:  Acute onset left-sided weakness and confusion. Takayasu arteritis  CT ANGIOGRAPHY HEAD AND NECK  Technique:  Multidetector CT imaging of the head and neck was performed using the standard protocol during bolus administration of intravenous contrast.  Multiplanar CT image reconstructions including MIPs were obtained to evaluate the vascular anatomy. Carotid stenosis measurements (when applicable) are obtained utilizing NASCET criteria, using the distal internal carotid diameter as the denominator.   Contrast: OMNIPAQUE IOHEXOL 350 MG/ML SOLN  Comparison:  MRI 12/05/2012  CTA NECK  Findings:  There is marked thickening of the aortic arch compatible with arteritis.  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  There is a filling defect in the innominate artery which appears to be thrombus.  The right innominate artery is patent.  The right common carotid artery is patent.  The carotid bifurcation is patent on the right.  There is occlusion of the right cavernous carotid artery which was widely patent on 12/05/2012. There is occlusion of most of the right M1 segment with some patent flow in the midportion.  There is flow in the right middle cerebral artery branches.  The findings suggest an embolus from the right innominate artery and to the right cavernous carotid and right M1 segment.   Review of the MIP images confirms the above findings.  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  Filling defect in the innominate artery may be due to fresh thrombus.  The right carotid bifurcation is widely patent.  There is occlusion of the right cavernous carotid and right M1 segment suggesting an embolus.  There is  flow and right middle cerebral artery branches.  CTA HEAD  Findings:  Subtle hypodensity in the right insula compatible with acute infarct.  No associated hemorrhage. 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  Ventricles are not enlarged.  No midline shift.  Small area of chronic infarct left medial occipital lobe and left thalamus. 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  This vessel was patent on 12/05/2012.  There is partial occlusion of the right middle cerebral artery M1 segment.  There is  fairly good flow in the right middle cerebral artery branches.  The right A1 segment and A2 segments are patent without filling defect.  The left internal carotid artery is occluded with reconstitution at the skull base via collaterals.  The left A1 segment and left anterior cerebral arteries are patent.  Left middle cerebral artery M1 and branches are patent.  Both vertebral arteries are patent to the basilar.  Small left vertebral reconstitutes in the mid neck.  The basilar is patent. Superior cerebellar and posterior cerebral arteries are patent bilaterally.  Negative for cerebral aneurysm.   Review of the MIP images confirms the above findings.  IMPRESSION: CT indicates  low density edema in the right insula suggestive of acute infarct.  Occlusion of the right cavernous carotid and right M1 segment suggestive of acute embolus.  There is a filling defect in the innominate artery which may be the source of embolus.  Chronic occlusion left carotid artery with reconstitution at the skull base and good perfusion of left M1 and A1 segments on the left.  Small left vertebral artery which is occluded proximally but reconstitutes in the mid neck.  I discussed the findings by telephone with Dr. Corliss Skains  at 2127 hours and with Dr. Noel Christmas at 2145 hours.   Original Report Authenticated By: Janeece Riggers, M.D.    Ct Head Wo Contrast  02/16/2013  *RADIOLOGY REPORT*  Clinical Data: Right CVA.  Post intervention CT.  CT HEAD WITHOUT CONTRAST  Technique:  Contiguous axial images were obtained from the base of the skull through the vertex without contrast.  Comparison: 02/15/2013.  Findings: There is loss of the insular ribbon on the right, with sub insular edema compatible with acute or subacute infarction. High density is present in the intravascular compartment compatible with recent iodinated contrast administration.  The venous sinuses also demonstrate increased attenuation.  Left cerebral hemisphere appears  normal.  Posterior fossa structures appear normal.  There may be a tiny amount of contrast staining along the course of the right MCA (image number 11 series 2).  Petechial hemorrhage considered less likely.  Paranasal sinuses appear within normal limits. No midline shift, hydrocephalus or definite evidence of hemorrhage.  IMPRESSION: Low attenuation in the right middle cerebral artery distribution compatible with acute / subacute infarction.  No definite hemorrhage.  Probable small area of contrast staining in the insula.  Redundant loop of the nasogastric tube is present in the hypopharynx.  Adjustment is recommended.  Finding was called to nurse Barbarann Ehlers at [phone] hours   Original Report Authenticated By: Andreas Newport, M.D.    Ct Angio Neck W/cm &/or Wo/cm  02/15/2013  *RADIOLOGY REPORT*  Clinical Data:  Acute onset left-sided weakness and confusion. Takayasu arteritis  CT ANGIOGRAPHY HEAD AND NECK  Technique:  Multidetector CT imaging of the head and neck was performed using the standard protocol during bolus administration of intravenous contrast.  Multiplanar CT image reconstructions including MIPs were obtained to evaluate the vascular  anatomy. Carotid stenosis measurements (when applicable) are obtained utilizing NASCET criteria, using the distal internal carotid diameter as the denominator.  Contrast: OMNIPAQUE IOHEXOL 350 MG/ML SOLN  Comparison:  MRI 12/05/2012  CTA NECK  Findings:  There is marked thickening of the aortic arch compatible with arteritis.  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  There is a filling defect in the innominate artery which appears to be thrombus.  The right innominate artery is patent.  The right common carotid artery is patent.  The carotid bifurcation is patent on the right.  There  is occlusion of the right cavernous carotid artery which was widely patent on 12/05/2012. There is occlusion of most of the right M1 segment with some patent flow in the midportion.  There is flow in the right middle cerebral artery branches.  The findings suggest an embolus from the right innominate artery and to the right cavernous carotid and right M1 segment.   Review of the MIP images confirms the above findings.  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  Filling defect in the innominate artery may be due to fresh thrombus.  The right carotid bifurcation is widely patent.  There is occlusion of the right cavernous carotid and right M1 segment suggesting an embolus.  There is flow and right middle cerebral artery branches.  CTA HEAD  Findings:  Subtle hypodensity in the right insula compatible with acute infarct.  No associated hemorrhage. 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  Ventricles are not enlarged.  No midline shift.  Small area of chronic infarct left medial occipital lobe and left thalamus. 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  This vessel was patent on 12/05/2012.  There is partial occlusion of the right middle cerebral artery M1 segment.  There is fairly good flow in the right middle cerebral artery branches.  The right A1 segment and A2 segments are patent without filling defect.  The left internal carotid artery is occluded with reconstitution at the skull base via collaterals.  The left A1 segment and left anterior cerebral arteries are patent.  Left middle cerebral artery M1 and branches are patent.  Both vertebral arteries are patent to the basilar.  Small left vertebral reconstitutes in the mid neck.  The basilar is patent. Superior cerebellar and posterior cerebral arteries are patent bilaterally.  Negative for cerebral aneurysm.   Review of the MIP  images confirms the above findings.  IMPRESSION: CT indicates  low density edema in the right insula suggestive of acute infarct.  Occlusion of the right cavernous carotid and right M1 segment suggestive of acute embolus.  There is a filling defect in the innominate artery which may be the source of embolus.  Chronic occlusion left carotid artery with reconstitution at the skull base and good perfusion of left M1 and A1 segments on the left.  Small left vertebral artery which is occluded proximally but reconstitutes in the mid neck.  I discussed the findings by telephone with Dr. Corliss Skains  at 2127 hours and with Dr. Noel Christmas at 2145 hours.   Original Report Authenticated By: Janeece Riggers, M.D.    Ct Cerebral Perfusion W/cm  02/15/2013  *RADIOLOGY REPORT*  Clinical Data:  Takayasu  arteritis.  Acute onset left-sided weakness and confusion.  CT CEREBRAL PERFUSION WITH CONTRAST  Contrast: OMNIPAQUE IOHEXOL 350 MG/ML SOLN  Comparison: MRI 12/05/2012  Findings: Decreased perfusion in the right MCA territory.  This involves the insula as well as the temporal parietal lobe.  There is decreased cerebral blood volume and decreased cerebral blood flow.  There is increased mean transit time.  Based on quantitative ROI assessment, much this territory may be at risk for infarction but not a complete infarction.  Cerebral blood flow is above 12 ml per 100 grams per minute.  Cerebral blood volume is greater than 2.5 ml per 100 grams.  IMPRESSION: Acute infarct right MCA territory.  Findings suggest majority of this may be penumbra and could potentially be reversible.   Original Report Authenticated By: Janeece Riggers, M.D.    Dg Chest Port 1 View  02/17/2013  *RADIOLOGY REPORT*  Clinical Data: Follow up atelectasis.  PORTABLE CHEST - 1 VIEW  Comparison: 02/16/2013.  Findings: Endotracheal tube and nasogastric tube appears similar to prior.  Mild right basilar atelectasis appears similar.  No airspace disease.  No  effusion.  No pneumothorax.  IMPRESSION:  1.  Stable support apparatus. 2.  Mild right basilar atelectasis.   Original Report Authenticated By: Andreas Newport, M.D.    Portable Chest Xray  02/16/2013  *RADIOLOGY REPORT*  Clinical Data: Endotracheal tube placement.  PORTABLE CHEST - 1 VIEW  Comparison: 12/12/2011 chest radiograph.  Findings: Nasogastric tube is present with the tip in the stomach. The proximal side port is at the level of the left hemidiaphragm and the tube could be advanced about 3 cm to prevent gastroesophageal reflux.  There is no airspace disease or pleural effusion.  The cardiopericardial silhouette appears within normal limits.  Endotracheal tube tip is 45 mm from the carina. Monitoring leads are projected over the chest.  IMPRESSION:  1.  Endotracheal tube 45 mm from the carina.  Good position. 2.  Nasogastric tube proximal side port at the gastroesophageal junction.  Consider advancing the NG tube without 3 cm to prevent gastroesophageal reflux. 3.  No active cardiopulmonary disease.   Original Report Authenticated By: Andreas Newport, M.D.    Dg Abd Portable 1v  02/16/2013  *RADIOLOGY REPORT*  Clinical Data: Gastric tube placement.  PORTABLE ABDOMEN - 1 VIEW  Comparison: Chest radiograph same day.  Findings: Tip of nasogastric tube is in the gastric fundus.  IVC filter is present.  The right femoral vascular sheath is noted. There is also a smaller bore left femoral vascular sheath.  Bowel gas pattern appears within normal limits.  IMPRESSION: Nasogastric tube tip in the stomach.  See chest radiograph today.   Original Report Authenticated By: Andreas Newport, M.D.     Anti-infectives: Anti-infectives   None      Assessment/Plan: s/p CVA; R ICA thrombectomy 3/24 Extubated Will report to Dr Corliss Skains   LOS: 2 days    [name] A 02/17/2013

## 2013-02-17 NOTE — Clinical Documentation Improvement (Signed)
GENERIC DOCUMENTATION CLARIFICATION QUERY  THIS DOCUMENT IS NOT A PERMANENT PART OF THE MEDICAL RECORD  Please update your documentation within the medical record to reflect your response to this query.                                                                                        02/17/13   Dear Jasmine December,  In a better effort to capture your patient's severity of illness, reflect appropriate length of stay and utilization of resources, a review of the patient medical record has revealed the following indicators.   Based on your clinical judgment, please clarify and document in a progress note and/or discharge summary the clinical condition associated with the following supporting information: In responding to this query please exercise your independent judgment.  The fact that a query is asked, does not imply that any particular answer is desired or expected.  Hello Jasmine December!  [name] was admitted acute CVA. Per CT Head 3/24: "There is loss of the insular ribbon on the right, with  sub insular edema compatible with acute or subacute infarction.". If possible, please help clarify the suspected diagnosis causing related to these findings. Thank you!  Possible Clinical Conditions?  - Sub Insular Edema  - Cerebral Edema   - Other condition (please document in the progress notes and/or discharge summary)  - Cannot Clinically determine at this time   Supporting Information:  - Acute CVA "H&P"   - "Headache with new onset left-sided weakness." H&P    Reviewed: additional documentation in the medical record , see note 3/26   Thank You,  Saul Fordyce  Clinical Documentation Specialist: [phone] Pager  Health Information Management [place]

## 2013-02-17 NOTE — Progress Notes (Addendum)
PULMONARY  / CRITICAL CARE MEDICINE  Name: [name] MRN: [id number] DOB: 1976-08-13    ADMISSION DATE:  02/15/2013 CONSULTATION DATE:  02/17/12  REFERRING MD :  Noel Christmas, Neurology  CHIEF COMPLAINT:  Vent management  BRIEF PATIENT DESCRIPTION:  37 yo with h/o Takayasu's arteritis and stroke a/w with acute stroke in R MCA distribution s/p carotid arteriogram with partial recanalization of the distal R ICA.  Remained on vent post-procedure and PCCM consulted.  SIGNIFICANT EVENTS / STUDIES:  3/24 R ICA arteriogram with partial recanalization  LINES / TUBES: R and L arterial sheath 3/24 >> 3/24 ETT 3/24 >>   SUBJECTIVE:  Tolerates pressure support.  Remains on propofol.    VITAL SIGNS: Temp:  [98.3 F (36.8 C)-98.7 F (37.1 C)] 98.3 F (36.8 C) (03/25 0357) Pulse Rate:  [76-127] 93 (03/25 0751) Resp:  [0-34] 15 (03/25 0751) BP: (73-167)/(47-105) 87/72 mmHg (03/25 0751) SpO2:  [98 %-100 %] 100 % (03/25 0751) Arterial Line BP: (124)/(79) 124/79 mmHg (03/24 0900) FiO2 (%):  [39.1 %-40.5 %] 40 % (03/25 0751) VENTILATOR SETTINGS: Vent Mode:  [-] CPAP;PSV FiO2 (%):  [39.1 %-40.5 %] 40 % Set Rate:  [10 bmp] 10 bmp Vt Set:  [450 mL] 450 mL PEEP:  [5 cmH20] 5 cmH20 Pressure Support:  [5 cmH20] 5 cmH20 Plateau Pressure:  [12 cmH20-16 cmH20] 13 cmH20 INTAKE / OUTPUT: Intake/Output     03/24 0701 - 03/25 0700 03/25 0701 - 03/26 0700   I.V. (mL/kg) 2201 (31.7)    NG/GT     Total Intake(mL/kg) 2201 (31.7)    Urine (mL/kg/hr) 1800 (1.1)    Total Output 1800     Net +401            PHYSICAL EXAMINATION: General: No distress Neuro: Sedated, not following commands HEENT: ETT in place Cardiovascular: s1s2 regular, no murmur Lungs: no wheeze Abdomen: soft, non tender Musculoskeletal: no edema Skin:  No rash  LABS:  Recent Labs Lab 02/15/13 1943 02/15/13 1944 02/15/13 1957 02/16/13 0420 02/16/13 0500 02/16/13 0600 02/17/13 0350  HGB 12.0  --  13.3  --   10.6*  --  9.0*  WBC 18.1*  --   --   --  12.7*  --  16.0*  PLT 284  --   --   --  295  --  275  NA 138  --  138  --  137  --  139  K 3.8  --  3.7  --  3.1*  --  3.7  CL 101  --  102  --  104  --  107  CO2 25  --   --   --  23  --  21  GLUCOSE 104*  --  111*  --  122*  --  106*  BUN 8  --  7  --  5*  --  6  CREATININE 0.70  --  0.80  --  0.57  --  0.76  CALCIUM 9.1  --   --   --  8.1*  --  8.3*  AST 18  --   --   --   --   --   --   ALT 8  --   --   --   --   --   --   ALKPHOS 80  --   --   --   --   --   --   BILITOT 0.2*  --   --   --   --   --   --  PROT 7.6  --   --   --   --   --   --   ALBUMIN 3.2*  --   --   --   --   --   --   APTT 20*  --   --   --   --   --   --   INR 1.14  --   --   --   --   --   --   TROPONINI  --  <0.30  --   --   --   --   --   PHART  --   --   --  7.560*  --  7.432  --   PCO2ART  --   --   --  21.2*  --  33.6*  --   PO2ART  --   --   --  202.0*  --  187.0*  --     Recent Labs Lab 02/16/13 1239 02/16/13 1617 02/16/13 1933 02/17/13 0017 02/17/13 0352  GLUCAP 66* 90 71 89 98    Imaging: Ct Angio Head W/cm &/or Wo Cm  02/15/2013  *RADIOLOGY REPORT*  Clinical Data:  Acute onset left-sided weakness and confusion. Takayasu arteritis  CT ANGIOGRAPHY HEAD AND NECK  Technique:  Multidetector CT imaging of the head and neck was performed using the standard protocol during bolus administration of intravenous contrast.  Multiplanar CT image reconstructions including MIPs were obtained to evaluate the vascular anatomy. Carotid stenosis measurements (when applicable) are obtained utilizing NASCET criteria, using the distal internal carotid diameter as the denominator.  Contrast: OMNIPAQUE IOHEXOL 350 MG/ML SOLN  Comparison:  MRI 12/05/2012  CTA NECK  Findings:  There is marked thickening of the aortic arch compatible with arteritis.  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  There is a filling defect in the innominate artery which appears to be thrombus.  The right innominate artery is patent.  The right common carotid artery is patent.  The carotid bifurcation is patent on the right.  There is occlusion of the right cavernous carotid artery which was widely patent on 12/05/2012. There is occlusion of most of the right M1 segment with some patent flow in the midportion.  There is flow in the right middle cerebral artery branches.  The findings suggest an embolus from the right innominate artery and to the right cavernous carotid and right M1 segment.   Review of the MIP images confirms the above findings.  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  Filling defect in the innominate artery may be due to fresh thrombus.  The right carotid bifurcation is widely patent.  There is occlusion of the right cavernous carotid and right M1 segment suggesting an embolus.  There is flow and right middle cerebral artery branches.  CTA HEAD  Findings:  Subtle hypodensity in the right insula compatible with acute infarct.  No associated hemorrhage. 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  Ventricles are not enlarged.  No midline shift.  Small area of chronic infarct left medial occipital lobe and left thalamus. 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  This vessel was patent on 12/05/2012.  There is  partial occlusion of the right middle cerebral artery M1 segment.  There is fairly good flow in the right middle cerebral artery branches.  The right A1 segment and A2 segments are patent without filling defect.  The left internal carotid artery is occluded with reconstitution at the skull base via collaterals.  The left A1 segment and left anterior cerebral arteries  are patent.  Left middle cerebral artery M1 and branches are patent.  Both vertebral arteries are patent to the basilar.  Small left vertebral reconstitutes in the mid neck.  The basilar is patent. Superior cerebellar and posterior cerebral arteries are patent bilaterally.  Negative for cerebral aneurysm.   Review of the MIP images confirms the above findings.  IMPRESSION: CT indicates  low density edema in the right insula suggestive of acute infarct.  Occlusion of the right cavernous carotid and right M1 segment suggestive of acute embolus.  There is a filling defect in the innominate artery which may be the source of embolus.  Chronic occlusion left carotid artery with reconstitution at the skull base and good perfusion of left M1 and A1 segments on the left.  Small left vertebral artery which is occluded proximally but reconstitutes in the mid neck.  I discussed the findings by telephone with Dr. Corliss Skains  at 2127 hours and with Dr. Noel Christmas at 2145 hours.   Original Report Authenticated By: Janeece Riggers, M.D.    Ct Head Wo Contrast  02/16/2013  *RADIOLOGY REPORT*  Clinical Data: Right CVA.  Post intervention CT.  CT HEAD WITHOUT CONTRAST  Technique:  Contiguous axial images were obtained from the base of the skull through the vertex without contrast.  Comparison: 02/15/2013.  Findings: There is loss of the insular ribbon on the right, with sub insular edema compatible with acute or subacute infarction. High density is present in the intravascular compartment compatible with recent iodinated contrast administration.  The venous sinuses also demonstrate increased attenuation.  Left cerebral hemisphere appears normal.  Posterior fossa structures appear normal.  There may be a tiny amount of contrast staining along the course of the right MCA (image number 11 series 2).  Petechial hemorrhage considered less likely.  Paranasal sinuses appear within normal limits. No midline shift, hydrocephalus or definite  evidence of hemorrhage.  IMPRESSION: Low attenuation in the right middle cerebral artery distribution compatible with acute / subacute infarction.  No definite hemorrhage.  Probable small area of contrast staining in the insula.  Redundant loop of the nasogastric tube is present in the hypopharynx.  Adjustment is recommended.  Finding was called to nurse Barbarann Ehlers at [phone] hours   Original Report Authenticated By: Andreas Newport, M.D.    Ct Angio Neck W/cm &/or Wo/cm  02/15/2013  *RADIOLOGY REPORT*  Clinical Data:  Acute onset left-sided weakness and confusion. Takayasu arteritis  CT ANGIOGRAPHY HEAD AND NECK  Technique:  Multidetector CT imaging of the head and neck was performed using the standard protocol during bolus administration of intravenous contrast.  Multiplanar CT image reconstructions including MIPs were obtained to evaluate the vascular anatomy. Carotid stenosis measurements (when applicable) are obtained utilizing NASCET criteria, using the distal internal carotid diameter as the denominator.  Contrast: OMNIPAQUE IOHEXOL 350 MG/ML SOLN  Comparison:  MRI 12/05/2012  CTA NECK  Findings:  There is marked thickening of the aortic arch compatible with arteritis.  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 There is chronic  occlusion of the left subclavian artery and left vertebral artery as noted on the prior MRA.  There is a filling defect in the innominate artery which appears to be thrombus.  The right innominate artery is patent.  The right common carotid artery is patent.  The carotid bifurcation is patent on the right.  There is occlusion of the right cavernous carotid artery which was widely patent on 12/05/2012. There is occlusion of most of the right M1 segment with some patent flow in the midportion.  There is flow in the right middle cerebral artery branches.  The findings suggest an embolus from the right  innominate artery and to the right cavernous carotid and right M1 segment.   Review of the MIP images confirms the above findings.  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  Filling defect in the innominate artery may be due to fresh thrombus.  The right carotid bifurcation is widely patent.  There is occlusion of the right cavernous carotid and right M1 segment suggesting an embolus.  There is flow and right middle cerebral artery branches.  CTA HEAD  Findings:  Subtle hypodensity in the right insula compatible with acute infarct.  No associated hemorrhage. 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  Ventricles are not enlarged.  No midline shift.  Small area of chronic infarct left medial occipital lobe and left thalamus. 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  This vessel was patent on 12/05/2012.  There is partial occlusion of the right middle cerebral artery M1 segment.  There is fairly good flow in the right middle cerebral artery branches.  The right A1 segment and A2 segments are patent without filling defect.  The left internal carotid artery is occluded with reconstitution at the skull base via collaterals.  The left A1 segment and left anterior cerebral arteries are patent.  Left middle cerebral artery M1 and branches are patent.  Both vertebral arteries are patent to the basilar.  Small left vertebral reconstitutes in the mid neck.  The basilar is patent. Superior cerebellar and posterior cerebral arteries are patent bilaterally.  Negative for cerebral aneurysm.   Review of the MIP images confirms the above findings.  IMPRESSION: CT indicates  low density edema in the right insula suggestive of acute infarct.  Occlusion of the right cavernous carotid and right M1 segment suggestive of acute embolus.  There is a filling defect in the innominate artery which may be the  source of embolus.  Chronic occlusion left carotid artery with reconstitution at the skull base and good perfusion of left M1 and A1 segments on the left.  Small left vertebral artery which is occluded proximally but reconstitutes in the mid neck.  I discussed the findings by telephone with Dr. Corliss Skains  at 2127 hours and with Dr. Noel Christmas at 2145 hours.   Original Report Authenticated By: Janeece Riggers, M.D.    Ct Cerebral Perfusion W/cm  02/15/2013  *RADIOLOGY REPORT*  Clinical Data:  Takayasu  arteritis.  Acute onset left-sided weakness and confusion.  CT CEREBRAL PERFUSION WITH CONTRAST  Contrast: OMNIPAQUE IOHEXOL 350 MG/ML SOLN  Comparison: MRI 12/05/2012  Findings: Decreased perfusion in the right MCA territory.  This involves the insula as well as the temporal parietal lobe.  There is decreased cerebral blood volume and decreased cerebral blood flow.  There is increased mean transit time.  Based on quantitative  ROI assessment, much this territory may be at risk for infarction but not a complete infarction.  Cerebral blood flow is above 12 ml per 100 grams per minute.  Cerebral blood volume is greater than 2.5 ml per 100 grams.  IMPRESSION: Acute infarct right MCA territory.  Findings suggest majority of this may be penumbra and could potentially be reversible.   Original Report Authenticated By: Janeece Riggers, M.D.    Dg Chest Port 1 View  02/17/2013  *RADIOLOGY REPORT*  Clinical Data: Follow up atelectasis.  PORTABLE CHEST - 1 VIEW  Comparison: 02/16/2013.  Findings: Endotracheal tube and nasogastric tube appears similar to prior.  Mild right basilar atelectasis appears similar.  No airspace disease.  No effusion.  No pneumothorax.  IMPRESSION:  1.  Stable support apparatus. 2.  Mild right basilar atelectasis.   Original Report Authenticated By: Andreas Newport, M.D.    Portable Chest Xray  02/16/2013  *RADIOLOGY REPORT*  Clinical Data: Endotracheal tube placement.  PORTABLE CHEST - 1 VIEW   Comparison: 12/12/2011 chest radiograph.  Findings: Nasogastric tube is present with the tip in the stomach. The proximal side port is at the level of the left hemidiaphragm and the tube could be advanced about 3 cm to prevent gastroesophageal reflux.  There is no airspace disease or pleural effusion.  The cardiopericardial silhouette appears within normal limits.  Endotracheal tube tip is 45 mm from the carina. Monitoring leads are projected over the chest.  IMPRESSION:  1.  Endotracheal tube 45 mm from the carina.  Good position. 2.  Nasogastric tube proximal side port at the gastroesophageal junction.  Consider advancing the NG tube without 3 cm to prevent gastroesophageal reflux. 3.  No active cardiopulmonary disease.   Original Report Authenticated By: Andreas Newport, M.D.    Dg Abd Portable 1v  02/16/2013  *RADIOLOGY REPORT*  Clinical Data: Gastric tube placement.  PORTABLE ABDOMEN - 1 VIEW  Comparison: Chest radiograph same day.  Findings: Tip of nasogastric tube is in the gastric fundus.  IVC filter is present.  The right femoral vascular sheath is noted. There is also a smaller bore left femoral vascular sheath.  Bowel gas pattern appears within normal limits.  IMPRESSION: Nasogastric tube tip in the stomach.  See chest radiograph today.   Original Report Authenticated By: Andreas Newport, M.D.       ASSESSMENT / PLAN:  NEUROLOGIC A:  Acute R CVA with h/o CVA P:   - Propofol for sedation  - Post stroke management per Neurology - f/u MRI brain 3/25  PULMONARY A: Respiratory failure post-procedure P:   - Pressure support wean as tolerated >> mental status precludes extubation - f/u CXR  CARDIOVASCULAR A: Hypertension with acute stroke. Hx of DVT on chronic coumadin. Sinus tachycardia developed 3/24 >> improved 3/25. P:  - Nicardipine gtt as needed - resume anti-coagulation when okay with neuro - f/u Echo  RHEUMATOLOGY  A: Hx of Takayasu's arteritis >> on chronic prednisone as  outpt. P: - change solumedrol to prednisone 20 mg per tube 3/25  RENAL  A: Hypokalemia >> improved 3/25. P: - F/u and replace electrolytes as needed  HEMATOLOGY  A: Anemia of chronic disease P: - F/u CBC  - transfuse for Hb < 7  GASTROINTESTINAL A:  Nutrition P:   - add tube feeds 3/25 - swallow evaluation after extubation  INFECTIOUS DISEASE  A: MRSA swab positive P: - Contact isolation  CC time 35 minutes.  Coralyn Helling, MD [facility] Pulmonary/Critical Care 02/17/2013,  8:05 AM Pager:  [phone] After 3pm call: [phone]  Pt mental status much improved, and now following commands off sedation.  Tolerating SBT.  D/w Dr. Pearlean Brownie.  Okay to proceed with extubation.  Coralyn Helling, MD [facility] Pulmonary/Critical Care 02/17/2013, 9:07 AM Pager:  [phone] After 3pm call: [phone]

## 2013-02-17 NOTE — Progress Notes (Signed)
Teryn Boerema B. Lakesia Dahle, PT, DPT #[phone]   

## 2013-02-17 NOTE — Significant Event (Signed)
Called by RN. Pt is asymptomatic per RN. Noted to be on chronic steroids.  Plan: Hydrocortisone 100 mg X 1 NS 500 cc bolus X 1  Billy Fischer, MD ; [facility] [phone].  After 5:30 PM or weekends, call [phone]

## 2013-02-17 NOTE — Procedures (Signed)
Extubation Procedure Note  Patient Details:   Name: [name] DOB: 06/07/1976 MRN: [id number]   Pt extubated to RA [facility] BS if needed) after successful SBT.  Pt unable to speak due to stroke.  Pt has good cough.  Will continue to monitor.   Evaluation  O2 sats: stable throughout Complications: No apparent complications Patient did tolerate procedure well. Bilateral Breath Sounds: Clear;Diminished Suctioning: Oral;Airway No  Alisi Lupien Apple 02/17/2013, 9:34 AM

## 2013-02-18 ENCOUNTER — Inpatient Hospital Stay (HOSPITAL_COMMUNITY): Payer: Medicare Other

## 2013-02-18 DIAGNOSIS — I6789 Other cerebrovascular disease: Secondary | ICD-10-CM

## 2013-02-18 LAB — CBC
MCH: 24.9 pg — ABNORMAL LOW (ref 26.0–34.0)
MCHC: 32.2 g/dL (ref 30.0–36.0)
Platelets: 276 10*3/uL (ref 150–400)
RDW: 18 % — ABNORMAL HIGH (ref 11.5–15.5)

## 2013-02-18 LAB — GLUCOSE, CAPILLARY: Glucose-Capillary: 94 mg/dL (ref 70–99)

## 2013-02-18 LAB — URINE MICROSCOPIC-ADD ON

## 2013-02-18 LAB — URINALYSIS, ROUTINE W REFLEX MICROSCOPIC
Bilirubin Urine: NEGATIVE
Glucose, UA: NEGATIVE mg/dL
Protein, ur: NEGATIVE mg/dL
Specific Gravity, Urine: 1.017 (ref 1.005–1.030)
Urobilinogen, UA: 1 mg/dL (ref 0.0–1.0)
pH: 6 (ref 5.0–8.0)

## 2013-02-18 LAB — BASIC METABOLIC PANEL
BUN: 7 mg/dL (ref 6–23)
Calcium: 8.9 mg/dL (ref 8.4–10.5)
GFR calc Af Amer: >90 mL/min (ref >90)
GFR calc non Af Amer: >90 mL/min (ref >90)
Glucose, Bld: 109 mg/dL — ABNORMAL HIGH (ref 70–99)

## 2013-02-18 MED ORDER — SODIUM CHLORIDE 0.9 % IV BOLUS (SEPSIS)
500.0000 mL | Freq: Once | INTRAVENOUS | Status: AC
  Administered 2013-02-18: 500 mL via INTRAVENOUS

## 2013-02-18 MED ORDER — ACETAMINOPHEN 325 MG PO TABS
650.0000 mg | ORAL_TABLET | ORAL | Status: DC | PRN
  Administered 2013-02-18 – 2013-02-19 (×3): 650 mg via ORAL
  Filled 2013-02-18 (×4): qty 2

## 2013-02-18 MED ORDER — PREDNISONE 20 MG PO TABS
20.0000 mg | ORAL_TABLET | Freq: Every day | ORAL | Status: DC
  Administered 2013-02-19 – 2013-02-21 (×3): 20 mg via ORAL
  Filled 2013-02-18 (×4): qty 1

## 2013-02-18 NOTE — Evaluation (Signed)
Clinical/Bedside Swallow Evaluation Patient Details  Name: [name] MRN: [id number] Date of Birth: 1976-03-14  Today's Date: 02/18/2013 Time: 4098-1191 SLP Time Calculation (min): 15 min  Past Medical History:  Past Medical History  Diagnosis Date  . PVD (peripheral vascular disease)   . Benign tumor of back   . Swelling, lymph nodes 2006    intermittant, benign  . DVT (deep venous thrombosis)   . Stroke     last week here   Past Surgical History:  Past Surgical History  Procedure Laterality Date  . Ivc filter placement    . Breast surgery      bil breast implants   HPI:  36 yo with h/o Takayasu's arteritis and stroke a/w with acute stroke in R MCA distribution s/p carotid arteriogram with partial recanalization of the distal R ICA.  Remained on vent post-procedure 3/24-2/25.    Assessment / Plan / Recommendation Clinical Impression  Patient presents with a mild-moderate primarily oral phase dysphagia characterized by reduced left sided labial seal and oral weakness resulting in anterior labial spillage of bolus and a delayed but functional oral transit. SLP cueing provided (moderate) for increased awareness of labial spillage, worsened due to left sided inattention. Recommend initiation of a mechanical soft diet, thin liquids with aspiration precautions and full supervision. Prognosis to advance good. SLP will f/u.     Aspiration Risk  Mild    Diet Recommendation Dysphagia 3 (Mechanical Soft);Thin liquid   Liquid Administration via: Cup;Straw Medication Administration: Whole meds with puree Supervision: Patient able to self feed;Full supervision/cueing for compensatory strategies Compensations: Slow rate;Small sips/bites;Check for pocketing;Check for anterior loss Postural Changes and/or Swallow Maneuvers: Seated upright 90 degrees    Other  Recommendations Oral Care Recommendations: Oral care BID   Follow Up Recommendations  Inpatient Rehab    Frequency and  Duration min 2x/week  2 weeks   Pertinent Vitals/Pain None reported    SLP Swallow Goals Patient will utilize recommended strategies during swallow to increase swallowing safety with: Minimal assistance Swallow Study Goal #2 - Progress:  (new goal)   Swallow Study Prior Functional Status  Vocation: Student    General HPI: 38 yo with h/o Takayasu's arteritis and stroke a/w with acute stroke in R MCA distribution s/p carotid arteriogram with partial recanalization of the distal R ICA.  Remained on vent post-procedure 3/24-2/25.  Type of Study: Bedside swallow evaluation Previous Swallow Assessment: none Diet Prior to this Study: NPO Temperature Spikes Noted: No Respiratory Status: Room air History of Recent Intubation: Yes Length of Intubations (days): 1 days Date extubated: 02/17/13 Behavior/Cognition: Alert;Cooperative;Distractible;Requires cueing;Decreased sustained attention Oral Cavity - Dentition: Adequate natural dentition Self-Feeding Abilities: Able to feed self Patient Positioning: Upright in bed Baseline Vocal Quality: Clear Volitional Cough: Cognitively unable to elicit Volitional Swallow: Able to elicit    Oral/Motor/Sensory Function Overall Oral Motor/Sensory Function: Impaired Labial ROM: Reduced left Labial Symmetry: Abnormal symmetry left Labial Strength: Reduced Labial Sensation: Reduced Lingual ROM: Reduced left Lingual Symmetry: Abnormal symmetry left Lingual Strength: Within Functional Limits Lingual Sensation: Within Functional Limits Facial ROM: Reduced left Facial Symmetry: Left droop Facial Strength: Within Functional Limits Facial Sensation: Reduced Velum:  (NT) Mandible: Within Functional Limits   Ice Chips Ice chips: Within functional limits Presentation: Spoon   Thin Liquid Thin Liquid: Impaired Presentation: Cup;Self Fed;Straw Oral Phase Impairments: Reduced labial seal Oral Phase Functional Implications: Left anterior spillage     Nectar Thick Nectar Thick Liquid: Not tested   Honey Thick Honey Thick  Liquid: Not tested   Puree Puree: Impaired Presentation: Self Fed Oral Phase Impairments: Reduced labial seal Oral Phase Functional Implications: Left anterior spillage;Right anterior spillage   Solid   GO   Sara Jerde MA, CCC-SLP [phone]  Solid: Impaired Presentation: Self Fed Oral Phase Impairments: Impaired anterior to posterior transit (mild but functional) Oral Phase Functional Implications: Left anterior spillage;Oral residue (mild, clears with min cues)       [name] 02/18/2013,12:02 PM

## 2013-02-18 NOTE — Evaluation (Signed)
Speech Language Pathology Evaluation Patient Details Name: [name] MRN: [id number] DOB: June 29, 1976 Today's Date: 02/18/2013 Time: 0940-1010 SLP Time Calculation (min): 30 min  Problem List:  Patient Active Problem List  Diagnosis  . ANEMIA, IRON DEFICIENCY  . DEPRESSION  . TAKAYASU'S ARTERITIS  . THROMBOSIS, VENOUS  . CONSTIPATION  . OVARIAN CYST  . FOOT DROP, LEFT  . CVA (cerebral infarction)  . Subtherapeutic international normalized ratio (INR)  . Postprocedural respiratory failure  . Essential hypertension, malignant  . Sinus tachycardia   Past Medical History:  Past Medical History  Diagnosis Date  . PVD (peripheral vascular disease)   . Benign tumor of back   . Swelling, lymph nodes 2006    intermittant, benign  . DVT (deep venous thrombosis)   . Stroke     last week here   Past Surgical History:  Past Surgical History  Procedure Laterality Date  . Ivc filter placement    . Breast surgery      bil breast implants   HPI:  37 yo with h/o Takayasu's arteritis and stroke a/w with acute stroke in R MCA distribution s/p carotid arteriogram with partial recanalization of the distal R ICA.  Remained on vent post-procedure 3/24-2/25.    Assessment / Plan / Recommendation Clinical Impression  Patient presents with cognitive linguistic impairements effecting initiation, attention, awareness, problem solving, judgement, and speech intelligibility. Diagnostic treatment included SLP providing max verbal cueing for initiation of verbal output, max cues for attention to left visual field and sustained attention to a basic functional ADL. Noted that frequency and appropriateness of verbal communication and interactions increased as evaluation and treatment progressed. Patient will benefit from skilled SLP f/u in the acute care setting as well as CIR after d/c.     SLP Assessment  Patient needs continued Speech Lanaguage Pathology Services    Follow Up Recommendations  Inpatient Rehab    Frequency and Duration min 2x/week  2 weeks   Pertinent Vitals/Pain None reported   SLP Goals  SLP Goals Potential to Achieve Goals: Good Progress/Goals/Alternative treatment plan discussed with pt/caregiver and they: Agree SLP Goal #1: Patient will initiate verbal communication with communication partner in response to questions with min clinician prompting. SLP Goal #1 - Progress:  (new goal) SLP Goal #2: Patient will attend to left visual field during a functional ADL with moderate cues.  SLP Goal #2 - Progress:  (new goal) SLP Goal #3: Patient will sustain attention to basic functional ADL for 15 minutes with moderate cues.  SLP Goal #3 - Progress:  (new goal) SLP Goal #4: Patient will demonstrate intellectual awareness of cognitive-linguistic impairements with moderate cues.  SLP Goal #4 - Progress:  (new goal)  SLP Evaluation Prior Functioning  Cognitive/Linguistic Baseline: Within functional limits Education:  (currently in PHD program) Vocation: Student   Cognition  Overall Cognitive Status: Impaired Arousal/Alertness: Lethargic Orientation Level: Oriented X4 Attention: Focused;Sustained Focused Attention: Impaired Focused Attention Impairment: Verbal basic;Functional basic Sustained Attention: Impaired Sustained Attention Impairment: Verbal basic;Functional basic Memory: Impaired Memory Impairment: Decreased recall of new information;Decreased short term memory Decreased Short Term Memory: Verbal complex Awareness: Impaired Awareness Impairment: Intellectual impairment (primarily of cognitive related deficit) Problem Solving: Impaired Problem Solving Impairment: Functional basic Executive Function: Initiating;Self Monitoring;Self Correcting;Decision Making Decision Making: Impaired Decision Making Impairment: Functional basic Initiating: Impaired Initiating Impairment: Functional basic Self Monitoring: Impaired Self Monitoring Impairment:  Functional basic Self Correcting: Impaired Self Correcting Impairment: Functional basic Behaviors: Impulsive (periods of decreased initiation as well  as impulsivity) Safety/Judgment: Impaired    Comprehension  Auditory Comprehension Overall Auditory Comprehension: Impaired Yes/No Questions: Within Functional Limits Commands: Impaired One Step Basic Commands: 75-100% accurate Two Step Basic Commands: 75-100% accurate Interfering Components: Attention Reading Comprehension Reading Status: Not tested    Expression Expression Primary Mode of Expression: Verbal Verbal Expression Overall Verbal Expression: Appears within functional limits for tasks assessed Other Verbal Expression Comments: no aphasia noted, poor initiation  Written Expression Dominant Hand: Right Written Expression: Not tested   Oral / Motor Oral Motor/Sensory Function Overall Oral Motor/Sensory Function: Impaired Labial ROM: Reduced left Labial Symmetry: Abnormal symmetry left Labial Strength: Reduced Labial Sensation: Reduced Lingual ROM: Reduced left Lingual Symmetry: Abnormal symmetry left Lingual Strength: Within Functional Limits Lingual Sensation: Within Functional Limits Facial ROM: Reduced left Facial Symmetry: Left droop Facial Strength: Within Functional Limits Facial Sensation: Reduced Velum:  (NT) Mandible: Within Functional Limits Motor Speech Overall Motor Speech: Impaired Respiration: Within functional limits Phonation: Normal Resonance: Within functional limits Articulation: Impaired Level of Impairment: Sentence Intelligibility: Intelligibility reduced Word: 50-74% accurate Motor Planning: Witnin functional limits Motor Speech Errors: Not applicable Effective Techniques: Increased vocal intensity;Slow rate   GO   Ferdinand Lango MA, CCC-SLP [phone]   Ferdinand Lango Meryl 02/18/2013, 12:10 PM

## 2013-02-18 NOTE — Progress Notes (Signed)
Pt. Unable to void; in and out cathed for 500 cc clear yellow urine. Will monitor.

## 2013-02-18 NOTE — Progress Notes (Signed)
  Echocardiogram 2D Echocardiogram has been performed.  [name] [name] 02/18/2013, 4:47 PM

## 2013-02-18 NOTE — Progress Notes (Signed)
Pt. C/o headache not relieved by Tylenol, but upon assessing pt., pt. very drowsy. Alveda Reasons, PA paged x 2 to determine if pt. Can travel off monitor and to see about pain mgmt. No return call received yet. D/W on-coming RN.

## 2013-02-18 NOTE — Progress Notes (Signed)
PULMONARY  / CRITICAL CARE MEDICINE  Name: [name] MRN: [id number] DOB: 31-Jan-1976    ADMISSION DATE:  02/15/2013 CONSULTATION DATE:  02/17/12  REFERRING MD :  Noel Christmas, Neurology  CHIEF COMPLAINT:  Vent management  BRIEF PATIENT DESCRIPTION:  37 yo with h/o Takayasu's arteritis and stroke a/w with acute stroke in R MCA distribution s/p carotid arteriogram with partial recanalization of the distal R ICA.  Remained on vent post-procedure and PCCM consulted.  SIGNIFICANT EVENTS / STUDIES:  3/24 R ICA arteriogram with partial recanalization 3/25 MRI brain >> acute Rt MCA infarct w/o hemorrhage, scattered small areas of acute infarct left frontal and parietal lobe.  LINES / TUBES: R and L arterial sheath 3/24 >> 3/24 ETT 3/24 >> 3/25  SUBJECTIVE:  Feels anxious.  Denies chest pain, dyspnea, abdominal pain.  Headache better.  VITAL SIGNS: Temp:  [97.6 F (36.4 C)-99.2 F (37.3 C)] 98.1 F (36.7 C) (03/26 0404) Pulse Rate:  [68-132] 84 (03/26 0900) Resp:  [12-29] 16 (03/26 0900) BP: (81-117)/(46-87) 101/76 mmHg (03/26 0900) SpO2:  [96 %-100 %] 100 % (03/26 0900) FiO2 (%):  [39.6 %] 39.6 % (03/25 0930) VENTILATOR SETTINGS: Vent Mode:  [-]  FiO2 (%):  [39.6 %] 39.6 % INTAKE / OUTPUT: Intake/Output     03/25 0701 - 03/26 0700 03/26 0701 - 03/27 0700   I.V. (mL/kg) 1324 (19.1) 100 (1.4)   IV Piggyback 1000    Total Intake(mL/kg) 2324 (33.4) 100 (1.4)   Urine (mL/kg/hr) 1120 (0.7)    Total Output 1120     Net +1204 +100          PHYSICAL EXAMINATION: General: No distress Neuro: alert follows commands, not moving Lt arm HEENT: no sinus tenderness Cardiovascular: s1s2 regular, no murmur Lungs: no wheeze Abdomen: soft, non tender Musculoskeletal: no edema Skin:  No rash  LABS:  Recent Labs Lab 02/15/13 1943 02/15/13 1944  02/16/13 0420 02/16/13 0500 02/16/13 0600 02/17/13 0350 02/18/13 0405  HGB 12.0  --   < >  --  10.6*  --  9.0* 7.7*  WBC 18.1*   --   --   --  12.7*  --  16.0* 20.0*  PLT 284  --   --   --  295  --  275 276  NA 138  --   < >  --  137  --  139 140  K 3.8  --   < >  --  3.1*  --  3.7 3.9  CL 101  --   < >  --  104  --  107 107  CO2 25  --   --   --  23  --  21 21  GLUCOSE 104*  --   < >  --  122*  --  106* 109*  BUN 8  --   < >  --  5*  --  6 7  CREATININE 0.70  --   < >  --  0.57  --  0.76 0.65  CALCIUM 9.1  --   --   --  8.1*  --  8.3* 8.9  AST 18  --   --   --   --   --   --   --   ALT 8  --   --   --   --   --   --   --   ALKPHOS 80  --   --   --   --   --   --   --  BILITOT 0.2*  --   --   --   --   --   --   --   PROT 7.6  --   --   --   --   --   --   --   ALBUMIN 3.2*  --   --   --   --   --   --   --   APTT 20*  --   --   --   --   --   --   --   INR 1.14  --   --   --   --   --   --   --   TROPONINI  --  <0.30  --   --   --   --   --   --   PHART  --   --   --  7.560*  --  7.432  --   --   PCO2ART  --   --   --  21.2*  --  33.6*  --   --   PO2ART  --   --   --  202.0*  --  187.0*  --   --   < > = values in this interval not displayed.  Recent Labs Lab 02/17/13 1622 02/17/13 1945 02/17/13 2343 02/18/13 0402 02/18/13 0806  GLUCAP 115* 114* 123* 108* 88    Imaging: Mr Brain Wo Contrast  02/17/2013  *RADIOLOGY REPORT*  Clinical Data: Stroke.  Takayasu's  arteritis  MRI HEAD WITHOUT CONTRAST  Technique:  Multiplanar, multiecho pulse sequences of the brain and surrounding structures were obtained according to standard protocol without intravenous contrast.  Comparison: CT 02/16/2013.  MRI 12/05/2012  Findings: Acute infarct in the right MCA territory involving the right insula, right basal ganglia, and right parietal lobe.  This is approximate one third of the MCA territory.  Small areas of acute infarct in the left frontal parietal lobe.  No brain stem infarct.  Ventricle size is normal.  Small chronic infarct left occipital lobe, as noted on the prior MRI.  Negative for intracranial hemorrhage.  No  significant midline shift.  No evidence of neoplasm.  Paranasal sinuses are clear.  IMPRESSION: Acute infarct right MCA territory without hemorrhage.  Scattered small areas of acute infarct left frontal and parietal lobe.   Original Report Authenticated By: Janeece Riggers, M.D.    Dg Chest Port 1 View  02/18/2013  *RADIOLOGY REPORT*  Clinical Data: Follow-up evaluation of atelectasis.  PORTABLE CHEST - 1 VIEW  Comparison: Chest x-ray 02/17/2013.  Findings: Study is limited by lack of visualization of the right costophrenic sulcus.  With this limitation in mind, there is no acute consolidative airspace disease and no definite pleural effusions.  Mild pulmonary venous crowding secondary to low lung volumes, without frank pulmonary edema.  Heart size is normal. The patient is rotated to the left on today's exam, resulting in distortion of the mediastinal contours and reduced diagnostic sensitivity and specificity for mediastinal pathology.  Previously noted nasogastric and endotracheal tubes have been removed. Surgical clips are noted at the level of the thoracic inlet slightly to the left of midline.  IMPRESSION: 1.  Low lung volumes without radiographic evidence of acute cardiopulmonary disease.   Original Report Authenticated By: Trudie Reed, M.D.    Dg Chest Port 1 View  02/17/2013  *RADIOLOGY REPORT*  Clinical Data: Follow up atelectasis.  PORTABLE CHEST - 1 VIEW  Comparison: 02/16/2013.  Findings: Endotracheal  tube and nasogastric tube appears similar to prior.  Mild right basilar atelectasis appears similar.  No airspace disease.  No effusion.  No pneumothorax.  IMPRESSION:  1.  Stable support apparatus. 2.  Mild right basilar atelectasis.   Original Report Authenticated By: Andreas Newport, M.D.       ASSESSMENT / PLAN:  NEUROLOGIC A:  Acute Rt MCA CVA with h/o CVA P:   - Post stroke management per Neurology  PULMONARY A: Respiratory failure post-procedure >> resolved. P:   - Oxygen as  needed for SpO2 > 92% - bronchial hygiene  CARDIOVASCULAR A: Hypertension with acute stroke. Hx of DVT on chronic coumadin. Sinus tachycardia developed 3/24 >> resolved 3/26. Hypotension 3/25 likely from hypovolemia >> resolved 3/26. P:  - resume anti-coagulation when okay with neuro - f/u Echo - even fluid balance  RHEUMATOLOGY A: Hx of Takayasu's arteritis >> on chronic prednisone as outpt. P: - Change solumedrol to prednisone when able to take oral meds  RENAL A: Hypokalemia >> resolved 3/26. P: - F/u and replace electrolytes as needed  HEMATOLOGY A: Anemia of chronic disease and hemodilution. P: - F/u CBC  - transfuse for Hb < 7  GASTROINTESTINAL A:  Nutrition P:   - swallow evaluation pending  INFECTIOUS DISEASE A: MRSA swab positive P: - Contact isolation  Okay for transfer out of ICU from PCCM standpoint.  PCCM will sign off.  Recommend consulting [facility] hospitalist if further medical assistance needed.   Coralyn Helling, MD [facility] Pulmonary/Critical Care 02/18/2013, 9:12 AM Pager:  [phone] After 3pm call: [phone]

## 2013-02-18 NOTE — Evaluation (Signed)
Occupational Therapy Evaluation Patient Details Name: GENENE KILMAN MRN: [id number] DOB: 1976/07/11 Today's Date: 02/18/2013 Time: 4098-1191 OT Time Calculation (min): 32 min  OT Assessment / Plan / Recommendation Clinical Impression  37 yo female with with R MCA, Recent L PCA, hx Lupus, and hx of Takayasu's Arteritis. Pt pta studying for doctorate from online program per patient. Pt with Lt side hemiplegia and lt inattention. Recommend CIR for d/c planning. Ot to follow acutely.    OT Assessment  Patient needs continued OT Services    Follow Up Recommendations  CIR    Barriers to Discharge      Equipment Recommendations  3 in 1 bedside comode;Wheelchair (measurements OT);Wheelchair cushion (measurements OT)    Recommendations for Other Services Rehab consult  Frequency  Min 2X/week    Precautions / Restrictions Precautions Precautions: Fall Restrictions Weight Bearing Restrictions: No   Pertinent Vitals/Pain     ADL  Eating/Feeding: Minimal assistance (cues to initiate and sustain task) Where Assessed - Eating/Feeding: Wheelchair Grooming: Moderate assistance;Wash/dry face Where Assessed - Grooming: Supported sitting (ignoring Lt side of mouth) Toilet Transfer: +2 Total assistance Toilet Transfer: Patient Percentage: 30% Equipment Used: Gait belt;Wheelchair Transfers/Ambulation Related to ADLs: pt completed stand pivot to the right to w/c for transfer to new room. pt with no AROM noted on Lt side ADL Comments: Pt sitting on EOB with Rt posterior lean with (A) to remain sitting. pt with right gaze deviation and neck rotation to the right. pt scanning to midline and jumping back to the right. pt looking to the left to locate therapist standing on the left. Pt with incr response when therapist standing on Rt. pt needed directive cues at time during session to progress patient. pt with incr verbalizations with prolonged sitting    OT Diagnosis: Generalized weakness;Cognitive  deficits;Disturbance of vision;Hemiplegia non-dominant side  OT Problem List: Decreased strength;Decreased activity tolerance;Decreased range of motion;Impaired balance (sitting and/or standing);Impaired vision/perception;Decreased coordination;Decreased cognition;Decreased safety awareness;Decreased knowledge of use of DME or AE;Decreased knowledge of precautions;Impaired UE functional use OT Treatment Interventions: Self-care/ADL training;Neuromuscular education;DME and/or AE instruction;Therapeutic activities;Cognitive remediation/compensation;Visual/perceptual remediation/compensation;Patient/family education;Balance training   OT Goals Acute Rehab OT Goals OT Goal Formulation: With patient Time For Goal Achievement: 03/04/13 Potential to Achieve Goals: Good ADL Goals Pt Will Perform Grooming: with set-up;Sitting, chair;Supported;with cueing (comment type and amount) ADL Goal: Grooming - Progress: Goal set today Pt Will Perform Upper Body Bathing: with min assist;Sitting, chair;Supported ADL Goal: Upper Body Bathing - Progress: Goal set today Pt Will Perform Upper Body Dressing: with min assist;Sitting, chair;Supported ADL Goal: Patent attorney - Progress: Goal set today Pt Will Transfer to Toilet: with max assist;Ambulation;3-in-1 ADL Goal: Toilet Transfer - Progress: Goal set today Miscellaneous OT Goals Miscellaneous OT Goal #1: Pt will locate 2 objects on Lt side with 4 trials during adl task OT Goal: Miscellaneous Goal #1 - Progress: Goal set today Miscellaneous OT Goal #2: Pt will sit EOB Supervision for ~5 minutes as precursor to adls OT Goal: Miscellaneous Goal #2 - Progress: Goal set today  Visit Information  Last OT Received On: 02/18/13 Assistance Needed: +2 PT/OT Co-Evaluation/Treatment: Yes    Subjective Data  Subjective: "I am getting my doctorate" Patient Stated Goal: reports son is at [employer] freshman- no clear goal stated   Prior  Functioning     Home Living Additional Comments: pt only responding to some questions.  pt indicates she is currently enrolled in school online and has an 88yr old son who  is enrolled at [employer].  Unclear if he lives with pt or if anyone lives with pt.   Prior Function Level of Independence: Independent Driving: No Vocation: Student Comments: pt indicates she didn't need A with anything PTA.  Communication Communication: Other (comment) ((Minimal verbalizations. Needs strong encouragement) Dominant Hand: Right         Vision/Perception Vision - History Patient Visual Report: Other (comment) (rt deviation) Vision - Assessment Additional Comments: due to cogntive deficits and participation unabel to complete visual assessment. Ot to continue to assess. Pt with rt deviation and scan only to midline   Cognition  Cognition Overall Cognitive Status: Impaired Area of Impairment: Attention;Following commands;Safety/judgement Arousal/Alertness: Lethargic Behavior During Session: Flat affect Current Attention Level: Sustained Following Commands: Follows one step commands inconsistently Safety/Judgement: Decreased safety judgement for tasks assessed;Decreased awareness of need for assistance Cognition - Other Comments: Difficult to assess cognition as pt only occasionally answering questions.  At times pt seems cognitively intact, but other times pt seems to have decreased cognition.      Extremity/Trunk Assessment Right Upper Extremity Assessment RUE ROM/Strength/Tone: WFL for tasks assessed;Due to impaired cognition RUE Sensation: WFL - Light Touch RUE Coordination: WFL - gross motor;WFL - fine motor (holding cracker and pincher grasp) Left Upper Extremity Assessment LUE ROM/Strength/Tone: Deficits;Due to impaired cognition LUE ROM/Strength/Tone Deficits: no AROM noted during session LUE Sensation: Deficits LUE Sensation Deficits: reports tactile input LUE  Coordination: Deficits Trunk Assessment Trunk Assessment: Kyphotic     Mobility Bed Mobility Bed Mobility: Supine to Sit;Sitting - Scoot to Edge of Bed Supine to Sit: 1: +2 Total assist Supine to Sit: Patient Percentage: 30% Sitting - Scoot to Edge of Bed: 1: +2 Total assist Sitting - Scoot to Edge of Bed: Patient Percentage: 0% Details for Bed Mobility Assistance: cueing for step-by-step through bed mobility.   Transfers Sit to Stand: 1: +2 Total assist;From bed Sit to Stand: Patient Percentage: 30% Stand to Sit: 1: +2 Total assist;With armrests;With upper extremity assist Stand to Sit: Patient Percentage: 30% Details for Transfer Assistance: cues for use of R UE on armrests, attempted to cue for movement of LEs, however pt unable to move L LE or unweight R LE.       Exercise     Balance Balance Balance Assessed: Yes Static Sitting Balance Static Sitting - Balance Support: Right upper extremity supported;Feet supported Static Sitting - Level of Assistance: 3: Mod assist Static Sitting - Comment/# of Minutes: pt able to sit EOB with ModA on L side. pt tends to lean to R side and trunks is rotated towards R. pt able to bring gaze to midline during sitting, but unable to cross midline to L side.    End of Session OT - End of Session Activity Tolerance: Patient limited by fatigue Patient left: in chair;with nursing in room Nurse Communication: Mobility status;Precautions  GO     Lucile Shutters 02/18/2013, 2:52 PM Pager: [phone]

## 2013-02-18 NOTE — Progress Notes (Signed)
Subjective: CVA; R ICA thrombectomy 3/24 Pt doing better Has moved to 4N02 We have been asked to evaluate pt regarding high wbc and drop in hgb  Objective: Vital signs in last 24 hours: Temp:  [97.3 F (36.3 C)-99.2 F (37.3 C)] 97.3 F (36.3 C) (03/26 1411) Pulse Rate:  [68-109] 80 (03/26 1411) Resp:  [12-23] 16 (03/26 1411) BP: (81-133)/(46-80) 133/75 mmHg (03/26 1411) SpO2:  [97 %-100 %] 100 % (03/26 1411)    Intake/Output from previous day: 03/25 0701 - 03/26 0700 In: 2324 [I.V.:1324; IV Piggyback:1000] Out: 1120 [Urine:1120] Intake/Output this shift: Total I/O In: 100 [I.V.:100] Out: -   PE:  VSS; afeb Hgb: 7.7 (9 and 10.6 previously) Wbc 20 today: prob secondary to steroid meds UA pending Rt groin sl tender; no bleeding; no hematoma Lt groin NT; no bleeding no hematoma B feet with 1+ pulses Rt extr moving well Lt arm - no movement Lt leg does have some spontaneous movement - not to command Pt communicating- few words but writing and motioning Discussed with Dr [name]  Lab Results:   Recent Labs  02/17/13 0350 02/18/13 0405  WBC 16.0* 20.0*  HGB 9.0* 7.7*  HCT 28.3* 23.9*  PLT 275 276   BMET  Recent Labs  02/17/13 0350 02/18/13 0405  NA 139 140  K 3.7 3.9  CL 107 107  CO2 21 21  GLUCOSE 106* 109*  BUN 6 7  CREATININE 0.76 0.65  CALCIUM 8.3* 8.9   PT/INR  Recent Labs  02/15/13 1943  LABPROT 14.4  INR 1.14   ABG  Recent Labs  02/16/13 0420 02/16/13 0600  PHART 7.560* 7.432  HCO3 19.4* 22.5    Studies/Results: Mr Brain Wo Contrast  02/17/2013  *RADIOLOGY REPORT*  Clinical Data: Stroke.  Takayasu's  arteritis  MRI HEAD WITHOUT CONTRAST  Technique:  Multiplanar, multiecho pulse sequences of the brain and surrounding structures were obtained according to standard protocol without intravenous contrast.  Comparison: CT 02/16/2013.  MRI 12/05/2012  Findings: Acute infarct in the right MCA territory involving the right insula,  right basal ganglia, and right parietal lobe.  This is approximate one third of the MCA territory.  Small areas of acute infarct in the left frontal parietal lobe.  No brain stem infarct.  Ventricle size is normal.  Small chronic infarct left occipital lobe, as noted on the prior MRI.  Negative for intracranial hemorrhage.  No significant midline shift.  No evidence of neoplasm.  Paranasal sinuses are clear.  IMPRESSION: Acute infarct right MCA territory without hemorrhage.  Scattered small areas of acute infarct left frontal and parietal lobe.   Original Report Authenticated By: [name], M.D.    Dg Chest Port 1 View  02/18/2013  *RADIOLOGY REPORT*  Clinical Data: Follow-up evaluation of atelectasis.  PORTABLE CHEST - 1 VIEW  Comparison: Chest x-ray 02/17/2013.  Findings: Study is limited by lack of visualization of the right costophrenic sulcus.  With this limitation in mind, there is no acute consolidative airspace disease and no definite pleural effusions.  Mild pulmonary venous crowding secondary to low lung volumes, without frank pulmonary edema.  Heart size is normal. The patient is rotated to the left on today's exam, resulting in distortion of the mediastinal contours and reduced diagnostic sensitivity and specificity for mediastinal pathology.  Previously noted nasogastric and endotracheal tubes have been removed. Surgical clips are noted at the level of the thoracic inlet slightly to the left of midline.  IMPRESSION: 1.  Low lung volumes  without radiographic evidence of acute cardiopulmonary disease.   Original Report Authenticated By: Trudie Reed, M.D.    Dg Chest Port 1 View  02/17/2013  *RADIOLOGY REPORT*  Clinical Data: Follow up atelectasis.  PORTABLE CHEST - 1 VIEW  Comparison: 02/16/2013.  Findings: Endotracheal tube and nasogastric tube appears similar to prior.  Mild right basilar atelectasis appears similar.  No airspace disease.  No effusion.  No pneumothorax.  IMPRESSION:  1.   Stable support apparatus. 2.  Mild right basilar atelectasis.   Original Report Authenticated By: Andreas Newport, M.D.     Anti-infectives: Anti-infectives   None      Assessment/Plan: s/p * No surgery found * R ICA thrombectomy 3/24 in IR Pt  Better Will check CT Pelvis w/o cx to r/o retroperitoneal bleed  LOS: 3 days    [name] A 02/18/2013

## 2013-02-18 NOTE — Progress Notes (Signed)
Rehab Admissions Coordinator Note:  Patient was screened by Meryl Dare for appropriateness for an Inpatient Acute Rehab Consult.  At this time, we are recommending Inpatient Rehab consult.  Meryl Dare 02/18/2013, 1:54 PM  I can be reached at [phone].

## 2013-02-18 NOTE — Progress Notes (Signed)
Stroke Team Progress Note  HISTORY [name] is an 37 y.o. 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 Onset is unclear. Deficits were noted early this afternoon 02/15/2013 when the mother went over to check on her because of apparent confusion described by neighbors. Patient has been on anticoagulation with Coumadin and has a history of less than ideal compliance. She states that she's been taking Coumadin. However, INR today was 1.14. No speech changes of the noted by the patient nor patient's mother. CT scan of the head showed an area of acute ischemia involving the right MCA territory, the majority of which appear to be penumbra and potentially reversible. 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 Filling defect involving the innominate artery was noted and thought to likely represent thrombus. The innominate artery was patent along with right common carotid artery. There was also occlusion of the right cavernous carotid and right M1 segment indicative embolic phenomena. NIH stroke scale was 8. Patient was not a TPA candidate secondary to delay in arrival as well as stroke within the past 3 months. She was admitted to the neuro ICU for further evaluation and treatment.  SUBJECTIVE Patient without complaints to MD speaking to her during rounds. No family at bedside. RN reports patient with behavior abnormalities - asking for pain medicine, then ignoring and refusing to speak to RN.  OBJECTIVE Most recent Vital Signs: Filed Vitals:   02/18/13 0618 02/18/13 0700 02/18/13 0800 02/18/13 0900  BP: 103/63 84/56 81/54  101/76  Pulse: 80 70 68 84  Temp:      TempSrc:      Resp: 13 13 13 16   Height:      Weight:      SpO2: 100% 100% 100% 100%   CBG (last 3)   Recent Labs   02/17/13 2343 02/18/13 0402 02/18/13 0806  GLUCAP 123* 108* 88   IV Fluid Intake:   . sodium chloride 50 mL/hr at 02/18/13 0900  . niCARDipine Stopped (02/16/13 0500)   MEDICATIONS  . antiseptic oral rinse  15 mL Mouth Rinse QID  . aspirin  325 mg Oral Daily   Or  . aspirin  300 mg Rectal Daily  . chlorhexidine  15 mL Mouth Rinse BID  . Chlorhexidine Gluconate Cloth  6 each Topical Q0600  . insulin aspart  0-15 Units Subcutaneous Q4H  . methylPREDNISolone (SOLU-MEDROL) injection  20 mg Intravenous Daily  . mupirocin ointment  1 application Nasal BID  . pantoprazole (PROTONIX) IV  40 mg Intravenous QHS   PRN:  acetaminophen, acetaminophen, fentaNYL, ondansetron (ZOFRAN) IV  Diet:  NPO  Activity:  Bedrest DVT Prophylaxis:  SCDs   CLINICALLY SIGNIFICANT STUDIES Basic Metabolic Panel:   Recent Labs Lab 02/17/13 0350 02/18/13 0405  NA 139 140  K 3.7 3.9  CL 107 107  CO2 21 21  GLUCOSE 106* 109*  BUN 6 7  CREATININE 0.76 0.65  CALCIUM 8.3* 8.9   Liver Function Tests:   Recent Labs Lab 02/15/13 1943  AST 18  ALT 8  ALKPHOS 80  BILITOT 0.2*  PROT 7.6  ALBUMIN 3.2*   CBC:   Recent Labs Lab 02/15/13 1943  02/16/13 0500 02/17/13 0350 02/18/13 0405  WBC 18.1*  --  12.7* 16.0* 20.0*  NEUTROABS 15.3*  --  9.4*  --   --  HGB 12.0  < > 10.6* 9.0* 7.7*  HCT 36.8  < > 31.7* 28.3* 23.9*  MCV 77.6*  --  75.8* 76.7* 77.3*  PLT 284  --  295 275 276  < > = values in this interval not displayed. Coagulation:   Recent Labs Lab 02/15/13 1943  LABPROT 14.4  INR 1.14   Cardiac Enzymes:   Recent Labs Lab 02/15/13 1944  TROPONINI <0.30   Urinalysis: No results found for this basename: COLORURINE, APPERANCEUR, LABSPEC, PHURINE, GLUCOSEU, HGBUR, BILIRUBINUR, KETONESUR, PROTEINUR, UROBILINOGEN, NITRITE, LEUKOCYTESUR,  in the last 168 hours Lipid Panel    Component Value Date/Time   CHOL 151 02/16/2013 0500   TRIG 166* 02/16/2013 0500   HDL 60 02/16/2013 0500    CHOLHDL 2.5 02/16/2013 0500   VLDL 33 02/16/2013 0500   LDLCALC 58 02/16/2013 0500   HgbA1C  Lab Results  Component Value Date   HGBA1C 6.0* 02/16/2013   Urine Drug Screen:   No results found for this basename: labopia,  cocainscrnur,  labbenz,  amphetmu,  thcu,  labbarb    Alcohol Level: No results found for this basename: ETH,  in the last 168 hours  CT of the brain  02/16/2013  Low attenuation in the right middle cerebral artery distribution compatible with acute / subacute infarction.  No definite hemorrhage.  Probable small area of contrast staining in the insula.  Redundant loop of the nasogastric tube is present in the hypopharynx.  Adjustment is recommended.     Ct Angio Head W/cm &/or Wo Cm 02/15/2013  CT indicates  low density edema in the right insula suggestive of acute infarct.  Occlusion of the right cavernous carotid and right M1 segment suggestive of acute embolus.  There is a filling defect in the innominate artery which may be the source of embolus.  Chronic occlusion left carotid artery with reconstitution at the skull base and good perfusion of left M1 and A1 segments on the left.  Small left vertebral artery which is occluded proximally but reconstitutes in the mid neck.    CT Angio Neck W/cm &/or Wo/cm 02/15/2013 Chronic occlusion of the left carotid artery, left subclavian artery, and left vertebral artery as seen on prior studies due to arteritis.  Filling defect in the innominate artery may be due to fresh thrombus.  The right carotid bifurcation is widely patent.  There is occlusion of the right cavernous carotid and right M1 segment suggesting an embolus.  There is flow and right middle cerebral artery branches.    CT Cerebral Perfusion W/cm 02/15/2013  Acute infarct right MCA territory.  Findings suggest majority of this may be penumbra and could potentially be reversible.    Cerebral Angiogrampartial recanalization of occluded distal RT ICA Using trevoprovue thrombectomy.  device.  MRI of the brain 02/17/2013  Acute infarct right MCA territory without hemorrhage. Scattered small areas of acute infarct left frontal and parietal lobe.  MRA of the brain  See angio  2D Echocardiogram    Carotid Doppler  Right: No evidence of hemodynamically significant internal carotid artery stenosis. Left: CCA appears occluded. The ECA appears to be feeding the ICA. Vertebral artery appears occluded.   CXR  02/17/2013 1. Stable support apparatus. 2. Mild right basilar atelectasis.  02/16/2013   1.  Endotracheal tube 45 mm from the carina.  Good position. 2.  Nasogastric tube proximal side port at the gastroesophageal junction.  Consider advancing the NG tube without 3 cm to prevent gastroesophageal reflux. 3.  No active cardiopulmonary disease.   EKG  normal sinus rhythm.   Therapy Recommendations   Physical Exam   Afebrile. Head is nontraumatic. Neck is supple without bruit.  Cardiac exam no murmur or gallop. Lungs are clear to auscultation. Distal pulses are well felt.bilateral groin arterial sheaths present.  Neurological Exam quiet speaks little.Eyes are open Right gaze deviation. Doll's eye movements absent to the left. Pupils 4 mm equal reactive. Corneal reflexes are present. Has weak cough and gag. Left lower facial weakness. Tongue is midline. She has purposeful and localizing movements on the right side to sternal rub and noxious stimuli. No left upper extremity movement to pain. Partial left lower extremity withdrawal to painful stimuli. Left plantar is upgoing. Right is downgoing.  ASSESSMENT [name] is a 37 y.o. female presenting with confusion,  headache for several days and new onset left facial and extremity weakness. Initial imaging confirms a right MCA territory infarct in the setting of Takayasu's's arteritis and hypercoagulability from lupus anticoagulant. MRI clarified left frontal and parietal lobe embolic infarcts. Infarct felt to be due to with  occlusion of the right cavernous carotid and right M1 segment associated with lupus anticoagulant. On warfarin prior to admission; subtherapeutic INR on arrival 1.14 Now on aspirin suppository for secondary stroke prevention. Patient with resultant left hemiparesis. Work up underway.   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 Urine cx neg. CXR R atx.? etoiology  Microcytic anemia, Hgb 7.7  Hypertension, resolved  MRSA positive nasal swab, on isolation  LDL 58  HgbA1c 6.0  Hospital day # 3  TREATMENT/PLAN  Continue aspirin suppository for secondary stroke prevention. Resume anticoagulation once able to swallow  Check swallow  OOB  Resume home meds once able to swallow  Transfer to the floor  UA, CXR  CBC, BMET in am  Radiology to check groin given drop in Hgb and increased WBC  F/u 2D echo  Annie Main, MSN, RN, ANVP-BC, ANP-BC, GNP-BC [facility] Pager: [phone] 02/18/2013 9:18 AM  I have personally obtained a history, examined the patient, evaluated imaging results, and formulated the assessment and plan of care. I agree with the above.   Delia Heady, MD

## 2013-02-18 NOTE — Evaluation (Signed)
Physical Therapy Evaluation Patient Details Name: [name] MRN: [id number] DOB: 1976/11/13 Today's Date: 02/18/2013 Time: 4098-1191 PT Time Calculation (min): 29 min  PT Assessment / Plan / Recommendation Clinical Impression  pt presents with R MCA, Recent L PCA, hx Lupus, and hx of Takayasu's Arteritis.  pt will occasionally answering questions and other times simply stares off to R side.  pt would benefit from CIR at D/C to maximize Independence.      PT Assessment  Patient needs continued PT services    Follow Up Recommendations  CIR    Does the patient have the potential to tolerate intense rehabilitation      Barriers to Discharge None      Equipment Recommendations  None recommended by PT    Recommendations for Other Services Rehab consult   Frequency Min 4X/week    Precautions / Restrictions Precautions Precautions: Fall Restrictions Weight Bearing Restrictions: No   Pertinent Vitals/Pain C/o headache.  RN aware.        Mobility  Bed Mobility Bed Mobility: Supine to Sit;Sitting - Scoot to Edge of Bed Supine to Sit: 1: +2 Total assist Supine to Sit: Patient Percentage: 30% Sitting - Scoot to Edge of Bed: 1: +2 Total assist Sitting - Scoot to Edge of Bed: Patient Percentage: 0% Details for Bed Mobility Assistance: cueing for step-by-step through bed mobility.   Transfers Transfers: Sit to Stand;Stand to Sit;Stand Pivot Transfers Sit to Stand: 1: +2 Total assist;From bed Sit to Stand: Patient Percentage: 30% Stand to Sit: 1: +2 Total assist;With armrests;With upper extremity assist (to W/C) Stand to Sit: Patient Percentage: 30% Stand Pivot Transfers: 1: +2 Total assist Stand Pivot Transfers: Patient Percentage: 30% Details for Transfer Assistance: cues for use of R UE on armrests, attempted to cue for movement of LEs, however pt unable to move L LE or unweight R LE.   Ambulation/Gait Ambulation/Gait Assistance: Not tested (comment) Stairs:  No Wheelchair Mobility Wheelchair Mobility: No Modified Rankin (Stroke Patients Only) Pre-Morbid Rankin Score: No symptoms Modified Rankin: Severe disability    Exercises     PT Diagnosis: Difficulty walking;Hemiplegia non-dominant side  PT Problem List: Decreased strength;Decreased range of motion;Decreased activity tolerance;Decreased balance;Decreased mobility;Decreased coordination;Decreased cognition;Decreased knowledge of use of DME;Decreased safety awareness;Cardiopulmonary status limiting activity;Pain;Impaired sensation PT Treatment Interventions: DME instruction;Gait training;Stair training;Functional mobility training;Therapeutic activities;Therapeutic exercise;Balance training;Neuromuscular re-education;Cognitive remediation;Patient/family education   PT Goals Acute Rehab PT Goals PT Goal Formulation: With patient Time For Goal Achievement: 03/04/13 Potential to Achieve Goals: Good Pt will go Supine/Side to Sit: with min assist PT Goal: Supine/Side to Sit - Progress: Goal set today Pt will Sit at Edge of Bed: with supervision;3-5 min PT Goal: Sit at Edge Of Bed - Progress: Goal set today Pt will go Sit to Supine/Side: with min assist PT Goal: Sit to Supine/Side - Progress: Goal set today Pt will go Sit to Stand: with min assist PT Goal: Sit to Stand - Progress: Goal set today Pt will go Stand to Sit: with min assist PT Goal: Stand to Sit - Progress: Goal set today Pt will Transfer Bed to Chair/Chair to Bed: with min assist PT Transfer Goal: Bed to Chair/Chair to Bed - Progress: Goal set today Pt will Stand: with min assist;3 - 5 min;with bilateral upper extremity support PT Goal: Stand - Progress: Goal set today Pt will Ambulate: 1 - 15 feet;with min assist;with least restrictive assistive device PT Goal: Ambulate - Progress: Goal set today  Visit Information  Last PT Received  On: 02/18/13 Assistance Needed: +2 PT/OT Co-Evaluation/Treatment: Yes    Subjective  Data  Subjective: I'm working on my PhD.   Patient Stated Goal: Back to school   Prior Functioning  Home Living Additional Comments: pt only responding to some questions.  pt indicates she is currently enrolled in school online and has an 39yr old son who is enrolled at [employer].  Unclear if he lives with pt or if anyone lives with pt.   Prior Function Level of Independence: Independent Driving: No Vocation: Student Comments: pt indicates she didn't need A with anything PTA.   Communication Communication: Other (comment) (Minimal verbalizations.  Needs strong encouragement.  )    Cognition  Cognition Overall Cognitive Status: Impaired Area of Impairment: Attention;Following commands;Safety/judgement Arousal/Alertness: Awake/alert Behavior During Session: Flat affect Current Attention Level: Sustained Following Commands: Follows one step commands inconsistently Safety/Judgement: Decreased safety judgement for tasks assessed;Decreased awareness of need for assistance Cognition - Other Comments: Difficult to assess cognition as pt only occasionally answering questions.  At times pt seems cognitively intact, but other times pt seems to have decreased cognition.      Extremity/Trunk Assessment Right Lower Extremity Assessment RLE ROM/Strength/Tone: Deficits RLE ROM/Strength/Tone Deficits: Actively moves R LE against gravity, but doesn't follow all directions to allow for testing.   RLE Sensation: WFL - Light Touch Left Lower Extremity Assessment LLE ROM/Strength/Tone: Deficits LLE ROM/Strength/Tone Deficits: Not observed moving L LE, however RN notes that pt has been observed moving L LE when she didn't realize staff were watching.   LLE Sensation: WFL - Light Touch Trunk Assessment Trunk Assessment: Kyphotic   Balance Balance Balance Assessed: Yes Static Sitting Balance Static Sitting - Balance Support: Right upper extremity supported;Feet supported Static Sitting -  Level of Assistance: 3: Mod assist Static Sitting - Comment/# of Minutes: pt able to sit EOB with ModA on L side.  pt tends to lean to R side and trunks is rotated towards R.  pt able to bring gaze to midline during sitting, but unable to cross midline to L side.    End of Session PT - End of Session Activity Tolerance: Patient limited by fatigue Patient left:  (in W/C with RN) Nurse Communication: Mobility status  GP     Gatsby Chismar, Alison Murray, PT [phone] 02/18/2013, 11:57 AM

## 2013-02-18 NOTE — Progress Notes (Signed)
Pts BP maintaining systolic < 90 with MAP of 57. Dr Herma Carson notified; orders given to give 500 cc bolus NS. Will continue to monitor pt.  Cyndie Chime [place]

## 2013-02-19 ENCOUNTER — Inpatient Hospital Stay (HOSPITAL_COMMUNITY): Payer: Medicare Other

## 2013-02-19 ENCOUNTER — Encounter (HOSPITAL_COMMUNITY): Payer: Self-pay | Admitting: Radiology

## 2013-02-19 DIAGNOSIS — M329 Systemic lupus erythematosus, unspecified: Secondary | ICD-10-CM

## 2013-02-19 DIAGNOSIS — I6340 Cerebral infarction due to embolism of unspecified cerebral artery: Secondary | ICD-10-CM

## 2013-02-19 LAB — GLUCOSE, CAPILLARY
Glucose-Capillary: 107 mg/dL — ABNORMAL HIGH (ref 70–99)
Glucose-Capillary: 92 mg/dL (ref 70–99)
Glucose-Capillary: 94 mg/dL (ref 70–99)

## 2013-02-19 LAB — BASIC METABOLIC PANEL
CO2: 25 mEq/L (ref 19–32)
Calcium: 9 mg/dL (ref 8.4–10.5)
Chloride: 106 mEq/L (ref 96–112)
Creatinine, Ser: 0.72 mg/dL (ref 0.50–1.10)
Glucose, Bld: 81 mg/dL (ref 70–99)

## 2013-02-19 LAB — CBC
HCT: 26.3 % — ABNORMAL LOW (ref 36.0–46.0)
MCH: 24.5 pg — ABNORMAL LOW (ref 26.0–34.0)
MCV: 77.6 fL — ABNORMAL LOW (ref 78.0–100.0)
RDW: 18.1 % — ABNORMAL HIGH (ref 11.5–15.5)
WBC: 15.8 10*3/uL — ABNORMAL HIGH (ref 4.0–10.5)

## 2013-02-19 MED ORDER — WARFARIN - PHARMACIST DOSING INPATIENT: Freq: Every day | Status: DC

## 2013-02-19 MED ORDER — WARFARIN VIDEO: Freq: Once | Status: DC

## 2013-02-19 MED ORDER — COUMADIN BOOK
Freq: Once | Status: AC
  Administered 2013-02-19: 18:00:00
  Filled 2013-02-19: qty 1

## 2013-02-19 MED ORDER — WARFARIN SODIUM 6 MG PO TABS
6.0000 mg | ORAL_TABLET | Freq: Once | ORAL | Status: DC
  Filled 2013-02-19: qty 1

## 2013-02-19 MED ORDER — BOOST / RESOURCE BREEZE PO LIQD
1.0000 | Freq: Two times a day (BID) | ORAL | Status: DC
  Administered 2013-02-19 – 2013-02-21 (×5): 1 via ORAL

## 2013-02-19 NOTE — Consult Note (Cosign Needed)
Sonja Wilson EdD 

## 2013-02-19 NOTE — Progress Notes (Signed)
Stroke Team Progress Note  HISTORY [name] is an 37 y.o. 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 Onset is unclear. Deficits were noted early this afternoon 02/15/2013 when the mother went over to check on her because of apparent confusion described by neighbors. Patient has been on anticoagulation with Coumadin and has a history of less than ideal compliance. She states that she's been taking Coumadin. However, INR today was 1.14. No speech changes of the noted by the patient nor patient's mother. CT scan of the head showed an area of acute ischemia involving the right MCA territory, the majority of which appear to be penumbra and potentially reversible. 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 Filling defect involving the innominate artery was noted and thought to likely represent thrombus. The innominate artery was patent along with right common carotid artery. There was also occlusion of the right cavernous carotid and right M1 segment indicative embolic phenomena. NIH stroke scale was 8. Patient was not a TPA candidate secondary to delay in arrival as well as stroke within the past 3 months. She was admitted to the neuro ICU for further evaluation and treatment.  SUBJECTIVE Patient up in gerichair at the bedside.  OBJECTIVE Most recent Vital Signs: Filed Vitals:   02/18/13 2200 02/19/13 0200 02/19/13 0600 02/19/13 0838  BP: 142/74 102/72 103/61 133/75  Pulse: 81 69 65 60  Temp: 98.3 F (36.8 C) 98.1 F (36.7 C) 97.7 F (36.5 C) 96.8 F (36 C)  TempSrc:    Axillary  Resp: 20 20 18 14   Height:      Weight:  68.6 kg (151 lb 3.8 oz)    SpO2: 98% 97% 100% 100%   CBG (last 3)   Recent Labs  02/19/13 0040 02/19/13 0409 02/19/13 0834  GLUCAP 107* 92 84    IV Fluid Intake:      MEDICATIONS  . aspirin  325 mg Oral Daily   Or  . aspirin  300 mg Rectal Daily  . Chlorhexidine Gluconate Cloth  6 each Topical Q0600  . insulin aspart  0-15 Units Subcutaneous Q4H  . mupirocin ointment  1 application Nasal BID  . predniSONE  20 mg Oral Daily   PRN:  acetaminophen, fentaNYL  Diet:  Dysphagia 3 thin liquids Activity: OOB DVT Prophylaxis:  SCDs   CLINICALLY SIGNIFICANT STUDIES Basic Metabolic Panel:   Recent Labs Lab 02/18/13 0405 02/19/13 0505  NA 140 141  K 3.9 3.3*  CL 107 106  CO2 21 25  GLUCOSE 109* 81  BUN 7 9  CREATININE 0.65 0.72  CALCIUM 8.9 9.0   Liver Function Tests:   Recent Labs Lab 02/15/13 1943  AST 18  ALT 8  ALKPHOS 80  BILITOT 0.2*  PROT 7.6  ALBUMIN 3.2*   CBC:   Recent Labs Lab 02/15/13 1943  02/16/13 0500  02/18/13 0405 02/19/13 0505  WBC 18.1*  --  12.7*  < > 20.0* 15.8*  NEUTROABS 15.3*  --  9.4*  --   --   --   HGB 12.0  < > 10.6*  < > 7.7* 8.3*  HCT 36.8  < > 31.7*  < > 23.9* 26.3*  MCV 77.6*  --  75.8*  < > 77.3* 77.6*  PLT 284  --  295  < >  276 314  < > = values in this interval not displayed. Coagulation:   Recent Labs Lab 02/15/13 1943  LABPROT 14.4  INR 1.14   Cardiac Enzymes:   Recent Labs Lab 02/15/13 1944  TROPONINI <0.30   Urinalysis:   Recent Labs Lab 02/18/13 1536  COLORURINE YELLOW  LABSPEC 1.017  PHURINE 6.0  GLUCOSEU NEGATIVE  HGBUR MODERATE*  BILIRUBINUR NEGATIVE  KETONESUR 40*  PROTEINUR NEGATIVE  UROBILINOGEN 1.0  NITRITE NEGATIVE  LEUKOCYTESUR NEGATIVE   Lipid Panel    Component Value Date/Time   CHOL 151 02/16/2013 0500   TRIG 166* 02/16/2013 0500   HDL 60 02/16/2013 0500   CHOLHDL 2.5 02/16/2013 0500   VLDL 33 02/16/2013 0500   LDLCALC 58 02/16/2013 0500   HgbA1C  Lab Results  Component Value Date   HGBA1C 6.0* 02/16/2013   Urine Drug Screen:   No results found for this basename: labopia,  cocainscrnur,  labbenz,  amphetmu,  thcu,   labbarb    Alcohol Level: No results found for this basename: ETH,  in the last 168 hours  CT of the brain  02/16/2013  Low attenuation in the right middle cerebral artery distribution compatible with acute / subacute infarction.  No definite hemorrhage.  Probable small area of contrast staining in the insula.  Redundant loop of the nasogastric tube is present in the hypopharynx.  Adjustment is recommended.     Ct Angio Head W/cm &/or Wo Cm 02/15/2013  CT indicates  low density edema in the right insula suggestive of acute infarct.  Occlusion of the right cavernous carotid and right M1 segment suggestive of acute embolus.  There is a filling defect in the innominate artery which may be the source of embolus.  Chronic occlusion left carotid artery with reconstitution at the skull base and good perfusion of left M1 and A1 segments on the left.  Small left vertebral artery which is occluded proximally but reconstitutes in the mid neck.    CT Angio Neck W/cm &/or Wo/cm 02/15/2013 Chronic occlusion of the left carotid artery, left subclavian artery, and left vertebral artery as seen on prior studies due to arteritis.  Filling defect in the innominate artery may be due to fresh thrombus.  The right carotid bifurcation is widely patent.  There is occlusion of the right cavernous carotid and right M1 segment suggesting an embolus.  There is flow and right middle cerebral artery branches.    CT Cerebral Perfusion W/cm 02/15/2013  Acute infarct right MCA territory.  Findings suggest majority of this may be penumbra and could potentially be reversible.    Cerebral Angiogram partial recanalization of occluded distal RT ICA Using trevoprovue thrombectomy. device.  MRI of the brain 02/17/2013  Acute infarct right MCA territory without hemorrhage. Scattered small areas of acute infarct left frontal and parietal lobe.  MRA of the brain  See angio  2D Echocardiogram  EF 60% with no source of embolus.   Carotid Doppler   Right: No evidence of hemodynamically significant internal carotid artery stenosis. Left: CCA appears occluded. The ECA appears to be feeding the ICA. Vertebral artery appears occluded.   CT Pelvis 02/18/2013 Mild hemorrhage around the puncture site in the right common femoral artery. Tiny amount of retroperitoneal hemorrhage in the pelvis. No large hematoma.   CXR  02/18/2013 no acute disease 02/17/2013 1. Stable support apparatus. 2. Mild right basilar atelectasis.  02/16/2013   1.  Endotracheal tube 45 mm from the carina.  Good position. 2.  Nasogastric tube proximal side port at the gastroesophageal junction.  Consider advancing the NG tube without 3 cm to prevent gastroesophageal reflux. 3.  No active cardiopulmonary disease.   EKG  normal sinus rhythm.   Therapy Recommendations CIR  Physical Exam   Afebrile. Head is nontraumatic. Neck is supple without bruit.  Cardiac exam no murmur or gallop. Lungs are clear to auscultation. Distal pulses are well felt.bilateral groin arterial sheaths present.  Neurological Exam quiet speaks little.Eyes are closed Right gaze deviation. Doll's eye movements absent to the left. Pupils 4 mm equal reactive. Corneal reflexes are present. Has weak cough and gag. Left lower facial weakness. Tongue is midline. She has purposeful and localizing movements on the right side to sternal rub and noxious stimuli. No left upper extremity movement to pain. Partial left lower extremity withdrawal to painful stimuli. Left plantar is upgoing. Right is downgoing.  ASSESSMENT [name] is a 37 y.o. female presenting with confusion,  headache for several days and new onset left facial and extremity weakness. Initial imaging confirms a right MCA territory infarct in the setting of Takayasu's's arteritis and hypercoagulability from lupus anticoagulant. MRI clarified left frontal and parietal lobe embolic infarcts. Infarct felt to be due to with occlusion of the right cavernous  carotid and right M1 segment associated with lupus anticoagulant. On warfarin prior to admission; subtherapeutic INR on arrival 1.14 Now on aspirin suppository for secondary stroke prevention. Patient with resultant left hemiparesis. Work up underway.   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 Urine cx neg. CXR neg. ? Reaction to steroids (prescribed at home PTA, likely non complaint as she was not taking her warfarin as well)  Microcytic anemia, improved to Hgb 7.7  Small right retroperitoneal hemorrhage  Hypertension, resolved  MRSA positive nasal swab, on isolation  LDL 58  HgbA1c 6.0  Hospital day # 4  TREATMENT/PLAN  Resume warfarin for secondary stroke prevention. May bridge with aspirin.   Await disposition - CIR consult in place  [facility], MSN, RN, ANVP-BC, ANP-BC, [facility] Pager: [phone] 02/19/2013 10:37 AM  I have personally obtained a history, examined the patient, evaluated imaging results, and formulated the assessment and plan of care. I agree with the above.  Delia Heady, MD

## 2013-02-19 NOTE — Progress Notes (Addendum)
Physical Therapy Treatment Patient Details Name: [name] MRN: [id number] DOB: August 31, 1976 Today's Date: 02/19/2013 Time: 0936-1000 PT Time Calculation (min): 24 min  PT Assessment / Plan / Recommendation Comments on Treatment Session   Pt cont's to require significant +2 (A) for all mobility at this date.     Follow Up Recommendations  CIR     Does the patient have the potential to tolerate intense rehabilitation     Barriers to Discharge        Equipment Recommendations  None recommended by PT    Recommendations for Other Services Rehab consult  Frequency Min 4X/week   Plan      Precautions / Restrictions Precautions Precautions: Fall Restrictions Weight Bearing Restrictions: No       Mobility  Bed Mobility Bed Mobility: Sitting - Scoot to Edge of Bed;Supine to Sit Supine to Sit: 1: +2 Total assist;HOB elevated;With rails Supine to Sit: Patient Percentage: 30% Sitting - Scoot to Edge of Bed: 1: +2 Total assist Sitting - Scoot to Edge of Bed: Patient Percentage: 0% Details for Bed Mobility Assistance: Max directional cues for sequencing & technique.  Pt reached across for rail on Lt side with Rt UE but poor problem solving with use of UE to assist with sitting upright.     Transfers Transfers: Sit to Stand;Stand to Sit;Stand Pivot Transfers Sit to Stand: 1: +2 Total assist;From bed Sit to Stand: Patient Percentage: 30% Stand to Sit: 1: +2 Total assist;With armrests;To chair/3-in-1 Stand to Sit: Patient Percentage: 30% Stand Pivot Transfers: 1: +2 Total assist Stand Pivot Transfers: Patient Percentage: 30% Details for Transfer Assistance: cues for use of R UE on armrests, attempted to cue for movement of LEs, however pt unable to move L LE or unweight R LE.   Ambulation/Gait Ambulation/Gait Assistance: Not tested (comment) Stairs: No Wheelchair Mobility Wheelchair Mobility: No Modified Rankin (Stroke Patients Only) Pre-Morbid Rankin Score: No  symptoms Modified Rankin: Severe disability      PT Goals Acute Rehab PT Goals Time For Goal Achievement: 03/04/13 Potential to Achieve Goals: Good Pt will go Supine/Side to Sit: with min assist PT Goal: Supine/Side to Sit - Progress: Not progressing Pt will Sit at [facility] of Bed: with supervision;3-5 min PT Goal: Sit at Edge Of Bed - Progress: Not progressing Pt will go Sit to Supine/Side: with min assist Pt will go Sit to Stand: with min assist PT Goal: Sit to Stand - Progress: Not progressing Pt will go Stand to Sit: with min assist PT Goal: Stand to Sit - Progress: Not progressing Pt will Transfer Bed to Chair/Chair to Bed: with min assist Pt will Stand: with min assist;3 - 5 min;with bilateral upper extremity support Pt will Ambulate: 1 - 15 feet;with min assist;with least restrictive assistive device  Visit Information  Last PT Received On: 02/19/13 Assistance Needed: +2    Subjective Data  Subjective: "Am I getting therapy tomorrow?"   Cognition  Cognition Overall Cognitive Status: Impaired Area of Impairment: Attention;Following commands;Safety/judgement Arousal/Alertness: Awake/alert Behavior During Session: Flat affect Current Attention Level: Sustained Following Commands: Follows one step commands with increased time Safety/Judgement: Decreased awareness of need for assistance;Decreased safety judgement for tasks assessed;Decreased awareness of safety precautions Cognition - Other Comments: Difficult to assess cognition as pt only occasionally answering questions.  At times pt seems cognitively intact, but other times pt seems to have decreased cognition.      Balance  Static Sitting Balance Static Sitting - Balance Support: Right upper extremity supported;Feet  supported Static Sitting - Level of Assistance: 2: Max assist Static Sitting - Comment/# of Minutes: Pt with poor postural/cervical control/LOB in all directions.    End of Session PT - End of  Session Activity Tolerance: Patient limited by fatigue Patient left: in chair;with call bell/phone within reach Nurse Communication: Mobility status     Verdell Face, [place] [phone] 02/19/2013

## 2013-02-19 NOTE — Progress Notes (Signed)
NUTRITION FOLLOW UP  Intervention:   - Resource Breeze supplement BID. Each supplement provides 250 kcal and 9 grams of protein.  - RD to continue to monitor.   Nutrition Dx:   Inadequate oral intake now related to decreased appetite as evidenced by 25-45% po intake. - ongoing   Goal:   Pt to meet >/= 90% of estimated needs - unmet   Monitor:   Toleration of diet advancement, po intake, weight trends   Assessment:   Pt is 37 yo female with a hx of Takayasu's arteritis, stroke in January 2014 and deep vein thrombosis, presenting with complaint of headache for several days and apparent new onset of left facial and extremity weakness. Onset is unclear. Pt underwent right ICA arteriogram with partial recanalization. She was intubated for the procedure and brought to the ICU for monitoring afterwards.   Pt extubated the morning of 3/25. Pt now advanced to dysphagia 3 diet.  Pt currently with 25-45% po intake.  Dietetic intern spoke with pt and family. Pt reports her appetite is returning and she is not interested in an Ensure to help her appetite. Family is concerned pt is not drinking enough. Pt interested in trying [employer] supplement.  Height: Ht Readings from Last 1 Encounters:  02/16/13 5\' 8"  (1.727 m)    Weight Status:   Wt Readings from Last 1 Encounters:  02/19/13 151 lb 3.8 oz (68.6 kg)    Re-estimated needs:  Kcal: 1700-1900 Protein: 90-105 gm  Fluid: >1.5 L   Skin: incision right & left groin   Diet Order: Dysphagia 3, thin liquids    Intake/Output Summary (Last 24 hours) at 02/19/13 1525 Last data filed at 02/19/13 1500  Gross per 24 hour  Intake    360 ml  Output    500 ml  Net   -140 ml    Last BM: none documented    Labs:   Recent Labs Lab 02/17/13 0350 02/18/13 0405 02/19/13 0505  NA 139 140 141  K 3.7 3.9 3.3*  CL 107 107 106  CO2 21 21 25   BUN 6 7 9   CREATININE 0.76 0.65 0.72  CALCIUM 8.3* 8.9 9.0  GLUCOSE 106* 109* 81    CBG  (last 3)   Recent Labs  02/19/13 0409 02/19/13 0834 02/19/13 1120  GLUCAP 92 84 94    Scheduled Meds: . aspirin  325 mg Oral Daily   Or  . aspirin  300 mg Rectal Daily  . Chlorhexidine Gluconate Cloth  6 each Topical Q0600  . insulin aspart  0-15 Units Subcutaneous Q4H  . mupirocin ointment  1 application Nasal BID  . predniSONE  20 mg Oral Daily    Continuous Infusions:   Belenda Cruise  Dietetic Intern Pager: [phone]    Student note/chart reviewed. Kendell Bane RD, LDN, CNSC [phone] Pager [phone] After Hours Pager

## 2013-02-19 NOTE — Progress Notes (Signed)
Speech Language Pathology Treatment Patient Details Name: KIKI BIVENS MRN: [id number] DOB: Nov 19, 1976 Today's Date: 02/19/2013 Time: 8295-6213 SLP Time Calculation (min): 41 min  Assessment / Plan / Recommendation Clinical Impression  Treatment focused on cognition and skilled observation of diet tolerance. Pt with no overt s/s of aspiration, however continues to exhibit left anterior spillage with thin liquids and requires min-mod SLP verbal cues for use of strategies (check for L anterior loss). SLP recommends pt remain on current diet with f/u for possible diet advancement and apspiration precautions. SLP also provided pt/family education on cognitive-linguistic strategies for langauge facilitation as well as review of compensatory strategies to aid in pt's immediate communicative needs while in acute setting. Pt showing mod improvements in cognition from initial evaluation 3/26. SLP will f/u for both diet tolerance and cognitive-linguistic tx at next date.    SLP Plan  Continue with current plan of care    Pertinent Vitals/Pain None reported  SLP Goals  SLP Goals Potential to Achieve Goals: Good Progress/Goals/Alternative treatment plan discussed with pt/caregiver and they: Agree SLP Goal #1: Patient will initiate verbal communication with communication partner in response to questions with min clinician prompting. SLP Goal #1 - Progress: Progressing toward goal SLP Goal #2: Patient will attend to left visual field during a functional ADL with moderate cues.  SLP Goal #2 - Progress: Progressing toward goal SLP Goal #3: Patient will sustain attention to basic functional ADL for 15 minutes with moderate cues.  SLP Goal #3 - Progress: Progressing toward goal SLP Goal #4: Patient will demonstrate intellectual awareness of cognitive-linguistic impairements with moderate cues.  SLP Goal #4 - Progress: Progressing toward goal  General Temperature Spikes Noted: No Respiratory Status: Room  air Behavior/Cognition: Alert;Cooperative;Pleasant mood;Decreased sustained attention Oral Cavity - Dentition: Adequate natural dentition Patient Positioning: Upright in chair  Oral Cavity - Oral Hygiene     Treatment Treatment focused on: Cognition;Dysarthria;Patient/family/caregiver education (skilled observation of diet tolerance) Family/Caregiver Educated: mother, sister   GO   Berdine Dance SLP student   Berdine Dance 02/19/2013, 4:26 PM

## 2013-02-19 NOTE — Progress Notes (Signed)
ANTICOAGULATION CONSULT NOTE - Initial Consult  Pharmacy Consult for Coumadin Indication: history stroke, DVT, lupus anticoagulant  No Known Allergies  Patient Measurements: Height: 5\' 8"  (172.7 cm) Weight: 151 lb 3.8 oz (68.6 kg) IBW/kg (Calculated) : 63.9  Vital Signs: Temp: 97.5 F (36.4 C) (03/27 1435) Temp src: Axillary (03/27 1435) BP: 134/62 mmHg (03/27 1435) Pulse Rate: 91 (03/27 1435)  Labs:  Recent Labs  02/17/13 0350 02/18/13 0405 02/19/13 0505  HGB 9.0* 7.7* 8.3*  HCT 28.3* 23.9* 26.3*  PLT 275 276 314  CREATININE 0.76 0.65 0.72    Estimated Creatinine Clearance: 96.2 ml/min (by C-G formula based on Cr of 0.72).   Medical History: Past Medical History  Diagnosis Date  . PVD (peripheral vascular disease)   . Benign tumor of back   . Swelling, lymph nodes 2006    intermittant, benign  . DVT (deep venous thrombosis)   . Stroke     last week here    Medications:  Prescriptions prior to admission  Medication Sig Dispense Refill  . ALPRAZolam (XANAX) 1 MG tablet Take 1 tablet (1 mg total) by mouth at bedtime as needed for anxiety.  20 tablet  0  . predniSONE (DELTASONE) 20 MG tablet Take 20 mg by mouth daily.       Marland Kitchen warfarin (COUMADIN) 7.5 MG tablet Take 3.75 mg by mouth daily.       Marland Kitchen zolpidem (AMBIEN) 10 MG tablet Take 1 tablet (10 mg total) by mouth at bedtime as needed for sleep.  20 tablet  0    Assessment: 37 y/o female with a history of stroke, DVT, and lupus anticoagulant on chronic Coumadin admitted with new stroke and now s/p R ICA thrombectomy on 3/24. Patient also has a history of noncompliance with an admit INR of 1.14. Pharmacy consulted to resume Coumadin for stroke prevention. Home dose of Coumadin is 3.75 mg po daily. She has a mild small hemorrhage at the R femoral site and a tiny retroperitoneal hemorrhage with no large hematoma. H/H are low, platelets are stable.  Goal of Therapy:  INR 2-3 Monitor platelets by anticoagulation  protocol: Yes   Plan:  -Coumadin 6 mg po tonight -INR daily -Coumadin book and video -Monitor for signs/symptoms of bleeding -Consider reducing aspirin dose to 81 mg when INR >2   [facility], Pharm.D., BCPS Clinical Pharmacist Pager: [phone] 02/19/2013 4:06 PM

## 2013-02-19 NOTE — Progress Notes (Signed)
Agree with student findings. Shakirra Buehler L. Babita Amaker, MA CCC/SLP Pager [phone]  

## 2013-02-19 NOTE — Consult Note (Signed)
Physical Medicine and Rehabilitation Consult Reason for Consult: CVA Referring Physician: Dr. Pearlean Brownie   HPI: [name] is a 37 y.o. 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 Admitted 02/16/2013 with complaints of headache and left-sided weakness with altered mental status. INR on admission of 1.14 with noted poor compliance on Coumadin. MRI of the brain showed acute infarct right MCA territory without hemorrhage as well as scattered small areas of acute infarct left frontal and parietal lobe. Patient did not receive TPA. Echocardiogram with ejection fraction of 60% without embolism. 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 There was a filling defect involving the innominate artery thought likely to represent thrombus. There was also occlusion of the right cavernous carotid and right M1 segment indicate if embolic phenomenon. Underwent ICA thrombectomy 02/16/2013 per interventional radiology. Patient presently on aspirin therapy per neurology services for stroke prevention question resume anti-coagulation. Currently maintained on a dysphagia 3 thin liquid diet. Physical and occupational therapy evaluations completed 02/18/2013 with recommendations of physical medicine rehabilitation consult to consider inpatient rehabilitation services.   Review of Systems  Unable to perform ROS  Past Medical History  Diagnosis Date  . PVD (peripheral vascular disease)   . Benign tumor of back   . Swelling, lymph nodes 2006    intermittant, benign  . DVT (deep venous thrombosis)   . Stroke     last week here   Past Surgical History  Procedure Laterality Date  . Ivc filter placement    . Breast surgery      bil breast implants   Family History  Problem Relation Age of Onset  .  Cancer - Other Father   . Coronary artery disease Mother   . Stroke Mother   . Hypertension Mother   . Hypertension Sister    Social History:  reports that she has never smoked. She has never used smokeless tobacco. She reports that she drinks about 3.0 ounces of alcohol per week. She reports that she does not use illicit drugs. Allergies: No Known Allergies Medications Prior to Admission  Medication Sig Dispense Refill  . ALPRAZolam (XANAX) 1 MG tablet Take 1 tablet (1 mg total) by mouth at bedtime as needed for anxiety.  20 tablet  0  . predniSONE (DELTASONE) 20 MG tablet Take 20 mg by mouth daily.       Marland Kitchen warfarin (COUMADIN) 7.5 MG tablet Take 3.75 mg by mouth daily.       Marland Kitchen zolpidem (AMBIEN) 10 MG tablet Take 1 tablet (10 mg total) by mouth at bedtime as needed for sleep.  20 tablet  0    Home: Home Living Additional Comments: pt only responding to some questions.  pt indicates she is currently enrolled in school online and has an 85yr old son who is enrolled at [employer].  Unclear if he lives with pt or if anyone lives with pt.    Functional History: Prior Function Driving: No Vocation: Student Comments: pt indicates she didn't need A with anything PTA.  Functional Status:  Mobility: Bed Mobility Bed Mobility: Supine to Sit;Sitting - Scoot to Edge of Bed Supine to Sit: 1: +2 Total assist Supine to Sit: Patient Percentage: 30% Sitting - Scoot to Edge of Bed: 1: +2 Total assist Sitting - Scoot to Edge of Bed: Patient Percentage:  0% Transfers Transfers: Sit to Stand;Stand to Sit;Stand Pivot Transfers Sit to Stand: 1: +2 Total assist;From bed Sit to Stand: Patient Percentage: 30% Stand to Sit: 1: +2 Total assist;With armrests;With upper extremity assist Stand to Sit: Patient Percentage: 30% Stand Pivot Transfers: 1: +2 Total assist Stand Pivot Transfers: Patient Percentage: 30% Ambulation/Gait Ambulation/Gait Assistance: Not tested (comment) Stairs: No Wheelchair  Mobility Wheelchair Mobility: No  ADL: ADL Eating/Feeding: Minimal assistance (cues to initiate and sustain task) Where Assessed - Eating/Feeding: Wheelchair Grooming: Moderate assistance;Wash/dry face Where Assessed - Grooming: Supported sitting (ignoring Lt side of mouth) Toilet Transfer: +2 Total assistance Equipment Used: Gait belt;Wheelchair Transfers/Ambulation Related to ADLs: pt completed stand pivot to the right to w/c for transfer to new room. pt with no AROM noted on Lt side ADL Comments: Pt sitting on EOB with Rt posterior lean with (A) to remain sitting. pt with right gaze deviation and neck rotation to the right. pt scanning to midline and jumping back to the right. pt looking to the left to locate therapist standing on the left. Pt with incr response when therapist standing on Rt. pt needed directive cues at time during session to progress patient. pt with incr verbalizations with prolonged sitting  Cognition: Cognition Overall Cognitive Status: Impaired Arousal/Alertness: Lethargic Orientation Level: Oriented X4 Attention: Focused;Sustained Focused Attention: Impaired Focused Attention Impairment: Verbal basic;Functional basic Sustained Attention: Impaired Sustained Attention Impairment: Verbal basic;Functional basic Memory: Impaired Memory Impairment: Decreased recall of new information;Decreased short term memory Decreased Short Term Memory: Verbal complex Awareness: Impaired Awareness Impairment: Intellectual impairment (primarily of cognitive related deficit) Problem Solving: Impaired Problem Solving Impairment: Functional basic Executive Function: Initiating;Self Monitoring;Self Correcting;Decision Making Decision Making: Impaired Decision Making Impairment: Functional basic Initiating: Impaired Initiating Impairment: Functional basic Self Monitoring: Impaired Self Monitoring Impairment: Functional basic Self Correcting: Impaired Self Correcting Impairment:  Functional basic Behaviors: Impulsive (periods of decreased initiation as well as impulsivity) Safety/Judgment: Impaired Cognition Overall Cognitive Status: Impaired Area of Impairment: Attention;Following commands;Safety/judgement Arousal/Alertness: Lethargic Behavior During Session: Flat affect Current Attention Level: Sustained Following Commands: Follows one step commands inconsistently Safety/Judgement: Decreased safety judgement for tasks assessed;Decreased awareness of need for assistance Cognition - Other Comments: Difficult to assess cognition as pt only occasionally answering questions.  At times pt seems cognitively intact, but other times pt seems to have decreased cognition.    Blood pressure 102/72, pulse 69, temperature 98.1 F (36.7 C), temperature source Oral, resp. rate 20, height 5\' 8"  (1.727 m), weight 68.6 kg (151 lb 3.8 oz), last menstrual period 02/15/2013, SpO2 97.00%. Physical Exam  HENT:  Head: Normocephalic.  Eyes:  Pupils reactive to light  Neck: Neck supple. No thyromegaly present.  Cardiovascular: Normal rate and regular rhythm.   Pulmonary/Chest: Effort normal and breath sounds normal. No respiratory distress.  Abdominal: Soft. Bowel sounds are normal. She exhibits no distension.  Neurological:  Patient is lethargic but arousable. She would fall asleep during exam and was nonverbal during my time in the room. She would not follow commands consistently. Appeared disinterested. POOR insight and awareness. Perseverates on results of CT. Left inattention. Saw no volitional movement in left arm or leg. Does sense pain on left.  Skin: Skin is warm and dry.  Psychiatric:  Flat     Results for orders placed during the hospital encounter of 02/15/13 (from the past 24 hour(s))  GLUCOSE, CAPILLARY     Status: None   Collection Time    02/18/13  8:06 AM      Result Value  Range   Glucose-Capillary 88  70 - 99 mg/dL  GLUCOSE, CAPILLARY     Status: None    Collection Time    02/18/13 11:38 AM      Result Value Range   Glucose-Capillary 94  70 - 99 mg/dL  URINALYSIS, ROUTINE W REFLEX MICROSCOPIC     Status: Abnormal   Collection Time    02/18/13  3:36 PM      Result Value Range   Color, Urine YELLOW  YELLOW   APPearance CLEAR  CLEAR   Specific Gravity, Urine 1.017  1.005 - 1.030   pH 6.0  5.0 - 8.0   Glucose, UA NEGATIVE  NEGATIVE mg/dL   Hgb urine dipstick MODERATE (*) NEGATIVE   Bilirubin Urine NEGATIVE  NEGATIVE   Ketones, ur 40 (*) NEGATIVE mg/dL   Protein, ur NEGATIVE  NEGATIVE mg/dL   Urobilinogen, UA 1.0  0.0 - 1.0 mg/dL   Nitrite NEGATIVE  NEGATIVE   Leukocytes, UA NEGATIVE  NEGATIVE  URINE MICROSCOPIC-ADD ON     Status: Abnormal   Collection Time    02/18/13  3:36 PM      Result Value Range   Squamous Epithelial / LPF RARE  RARE   WBC, UA 0-2  <3 WBC/hpf   RBC / HPF 11-20  <3 RBC/hpf   Bacteria, UA RARE  RARE   Casts HYALINE CASTS (*) NEGATIVE   Urine-Other MUCOUS PRESENT     Ct Pelvis Wo Contrast  02/19/2013  *RADIOLOGY REPORT*  Clinical Data: Recent catheterization.  Retroperitoneal hematoma.  CT PELVIS WITHOUT CONTRAST  Technique:  Multidetector CT imaging of the pelvis was performed following the standard protocol without intravenous contrast.  Comparison: None.  Findings: There is a small amount of hemorrhage around the right common femoral artery and neurovascular bundle.  Tiny amount of retroperitoneal hemorrhage is present in the pelvis without cranial extension along the iliopsoas.  There is respiratory motion artifact present on the examination.  Small amount of free fluid is present in the anatomic pelvis.  Retroverted, retroflexed uterus. Benign appearing cystic lesion in the left adnexa for which no further evaluation is required.  Air is present within the urinary bladder, likely iatrogenic.  Bones appear within normal limits.  Compared to prior plain films, the catheters have been removed.  IMPRESSION: Mild  hemorrhage around the puncture site in the right common femoral artery.  Tiny amount of retroperitoneal hemorrhage in the pelvis.  No large hematoma.   Original Report Authenticated By: Andreas Newport, M.D.    Mr Brain Wo Contrast  02/17/2013  *RADIOLOGY REPORT*  Clinical Data: Stroke.  Takayasu's  arteritis  MRI HEAD WITHOUT CONTRAST  Technique:  Multiplanar, multiecho pulse sequences of the brain and surrounding structures were obtained according to standard protocol without intravenous contrast.  Comparison: CT 02/16/2013.  MRI 12/05/2012  Findings: Acute infarct in the right MCA territory involving the right insula, right basal ganglia, and right parietal lobe.  This is approximate one third of the MCA territory.  Small areas of acute infarct in the left frontal parietal lobe.  No brain stem infarct.  Ventricle size is normal.  Small chronic infarct left occipital lobe, as noted on the prior MRI.  Negative for intracranial hemorrhage.  No significant midline shift.  No evidence of neoplasm.  Paranasal sinuses are clear.  IMPRESSION: Acute infarct right MCA territory without hemorrhage.  Scattered small areas of acute infarct left frontal and parietal lobe.   Original Report Authenticated By: Janeece Riggers,  M.D.    Dg Chest Port 1 View  02/18/2013  *RADIOLOGY REPORT*  Clinical Data: Right side chest pain.  PORTABLE CHEST - 1 VIEW  Comparison: Plain film chest 02/18/2013 AT chest 11/03/2012.  Findings: Bilateral breast implants are noted.  Lungs are clear. Heart size is normal.  No pneumothorax or pleural effusion.  IMPRESSION: No acute disease.   Original Report Authenticated By: Holley Dexter, M.D.    Dg Chest Port 1 View  02/18/2013  *RADIOLOGY REPORT*  Clinical Data: Follow-up evaluation of atelectasis.  PORTABLE CHEST - 1 VIEW  Comparison: Chest x-ray 02/17/2013.  Findings: Study is limited by lack of visualization of the right costophrenic sulcus.  With this limitation in mind, there is no acute  consolidative airspace disease and no definite pleural effusions.  Mild pulmonary venous crowding secondary to low lung volumes, without frank pulmonary edema.  Heart size is normal. The patient is rotated to the left on today's exam, resulting in distortion of the mediastinal contours and reduced diagnostic sensitivity and specificity for mediastinal pathology.  Previously noted nasogastric and endotracheal tubes have been removed. Surgical clips are noted at the level of the thoracic inlet slightly to the left of midline.  IMPRESSION: 1.  Low lung volumes without radiographic evidence of acute cardiopulmonary disease.   Original Report Authenticated By: Trudie Reed, M.D.     Assessment/Plan: Diagnosis: Right MCA infarct, thromboembolic 1. Does the need for close, 24 hr/day medical supervision in concert with the patient's rehab needs make it unreasonable for this patient to be served in a less intensive setting? Potentially 2. Co-Morbidities requiring supervision/potential complications: anemia,depression, takayasus's arteritis 3. Due to bladder management, bowel management, safety, skin/wound care, disease management, medication administration, pain management and patient education, does the patient require 24 hr/day rehab nursing? Yes 4. Does the patient require coordinated care of a physician, rehab nurse, PT (1-2 hrs/day, 5 days/week), OT (1-2 hrs/day, 5 days/week) and SLP (1-2 hrs/day, 5 days/week) to address physical and functional deficits in the context of the above medical diagnosis(es)? Yes Addressing deficits in the following areas: balance, endurance, locomotion, strength, transferring, bowel/bladder control, bathing, dressing, feeding, grooming, toileting, cognition, speech, language, swallowing and psychosocial support 5. Can the patient actively participate in an intensive therapy program of at least 3 hrs of therapy per day at least 5 days per week? Potentially 6. The potential for  patient to make measurable gains while on inpatient rehab is good 7. Anticipated functional outcomes upon discharge from inpatient rehab are min to mod assist with PT, min to mod asssist with OT, supervision to minimal assist with SLP. 8. Estimated rehab length of stay to reach the above functional goals is: 3 weeks 9. Does the patient have adequate social supports to accommodate these discharge functional goals? Potentially 10. Anticipated D/C setting: Home 11. Anticipated post D/C treatments: Outpt therapy 12. Overall Rehab/Functional Prognosis: good and fair  RECOMMENDATIONS: This patient's condition is appropriate for continued rehabilitative care in the following setting: CIR Patient has agreed to participate in recommended program. Potentially Note that insurance prior authorization may be required for reimbursement for recommended care.  Comment: Pt with limited insight and awareness. Will need substantial assistance still even upon discharge from CIR. Who will provide this?  Rehab RN to follow up.   Ranelle Oyster, MD, [place] [facility]     02/19/2013

## 2013-02-19 NOTE — Progress Notes (Signed)
Patient ID: [name], female   DOB: 21-Nov-1976, 37 y.o.   MRN: [id number]   Hbg 8.3 (7.7) Wbc 15.8 (20) CT 3/26: Mild small hemorrhage at Rt femoral site And tiny retroperitoneal hemorrhage No large hematoma  Will follow

## 2013-02-19 NOTE — Progress Notes (Signed)
Pt states that "coumadin gives her bad headaches "migraines they are so bad that she passes out", doesn't want to take coumadin.  Will notify physician.

## 2013-02-19 NOTE — Progress Notes (Signed)
Pts family at bedside having questions as to patients prognosis, and dx.  Call placed to [facility], and she said that at this point the doctor is busy,  But that they will be making rounds in am, and that basically waiting for patient to find placement in rehab.  Perla Echavarria RN

## 2013-02-20 LAB — GLUCOSE, CAPILLARY
Glucose-Capillary: 128 mg/dL — ABNORMAL HIGH (ref 70–99)
Glucose-Capillary: 91 mg/dL (ref 70–99)
Glucose-Capillary: 91 mg/dL (ref 70–99)
Glucose-Capillary: 91 mg/dL (ref 70–99)
Glucose-Capillary: 144 mg/dL — ABNORMAL HIGH (ref 70–99)

## 2013-02-20 LAB — PROTIME-INR
INR: 1.14 (ref 0.00–1.49)
Prothrombin Time: 14.4 seconds (ref 11.6–15.2)

## 2013-02-20 MED ORDER — DABIGATRAN ETEXILATE MESYLATE 150 MG PO CAPS
150.0000 mg | ORAL_CAPSULE | Freq: Two times a day (BID) | ORAL | Status: DC
  Filled 2013-02-20 (×2): qty 1

## 2013-02-20 MED ORDER — DIPHENHYDRAMINE HCL 25 MG PO CAPS
25.0000 mg | ORAL_CAPSULE | Freq: Every evening | ORAL | Status: DC | PRN
  Administered 2013-02-20 (×2): 25 mg via ORAL
  Filled 2013-02-20 (×2): qty 1

## 2013-02-20 MED ORDER — RIVAROXABAN 20 MG PO TABS
20.0000 mg | ORAL_TABLET | Freq: Every day | ORAL | Status: DC
  Administered 2013-02-20 – 2013-02-21 (×2): 20 mg via ORAL
  Filled 2013-02-20 (×3): qty 1

## 2013-02-20 NOTE — PMR Pre-admission (Signed)
PMR Admission Coordinator Pre-Admission Assessment  Patient: [name] is an 37 y.o., female MRN: [id number] DOB: 11/14/1976 Height: 5\' 8"  (172.7 cm) Weight: 70.1 kg (154 lb 8.7 oz)              Insurance Information HMO:      PPO:       PCP:       IPA:       80/20:       OTHER:   PRIMARY: Medicare A/B      Policy#: [id number]      Subscriber: Cindra Presume CM Name:       Phone#:       Fax#:   Pre-Cert#:        Employer: Disabled.  Unemployed Benefits:  Phone #:       Name: [place] Date: 08/27/11     Deduct: $1216      Out of Pocket Max: none      Life Max: unlimited CIR: 100%      SNF: 100 days Outpatient: 80%     Co-Pay: 20% Home Health: 100%      Co-Pay: none DME: 80%     Co-Pay: 20% Providers: patient's choice  Insurance note:  Was previously in the [employer].  Tricare not active any longer.  Has Humana for prescriptions only.  Last name under medicare is Yvone Neu.  Emergency Contact Information Contact Information   Name Relation Home Work Mobile   [place] Mother [phone]  [phone]   [place],Mr. Son [phone]       Current Medical History  Patient Admitting Diagnosis:  R MCA infarct  History of Present Illness: A 37 y.o. 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 Admitted 02/16/2013 with complaints of headache and left-sided weakness with altered mental status. INR on admission of 1.14 with noted poor compliance on Coumadin. MRI of the brain showed acute infarct right MCA territory without hemorrhage as well as scattered small areas of acute infarct left frontal and parietal lobe. Patient did not receive TPA. Echocardiogram with ejection fraction of 60% without embolism. 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 There was a filling defect  involving the innominate artery thought likely to represent thrombus. There was also occlusion of the right cavernous carotid and right M1 segment indicate if embolic phenomenon. Underwent ICA thrombectomy 02/16/2013 per interventional radiology. Patient presently on aspirin therapy per neurology services for stroke prevention question resume anti-coagulation. Currently maintained on a dysphagia 3 thin liquid diet. Physical and occupational therapy evaluations completed 02/18/2013 with recommendations of physical medicine rehabilitation consult to consider inpatient rehabilitation services.     Total: 8=NIH  Past Medical History  Past Medical History  Diagnosis Date  . PVD (peripheral vascular disease)   . Benign tumor of back   . Swelling, lymph nodes 2006    intermittant, benign  . DVT (deep venous thrombosis)   . Stroke     last week here   Family History  family history includes Cancer - Other in her father; Coronary artery disease in her mother; Hypertension in her mother and sister; and Stroke in her mother.  Prior Rehab/Hospitalizations: No previous rehab admissions.   Current Medications  Current facility-administered medications:acetaminophen (TYLENOL) tablet 650 mg, 650 mg, Oral, Q4H PRN, Layne Benton, NP, 650 mg at 02/19/13 2032;  Chlorhexidine Gluconate Cloth 2 % PADS 6 each, 6 each, Topical, Q0600, Zigmund Gottron, MD, 6 each at 02/20/13 0604;  diphenhydrAMINE (BENADRYL) capsule 25 mg, 25 mg, Oral, QHS PRN, Thana Farr, MD, 25 mg at 02/20/13 0014 feeding supplement (RESOURCE BREEZE) liquid 1 Container, 1 Container, Oral, BID BM, Heather Cornelison Pitts, RD, 1 Container at 02/20/13 1123;  fentaNYL (SUBLIMAZE) injection 25-100 mcg, 25-100 mcg, Intravenous, Q2H PRN, Zigmund Gottron, MD, 25 mcg at 02/18/13 0433;  insulin aspart (novoLOG) injection 0-15 Units, 0-15 Units, Subcutaneous, Q4H, Zigmund Gottron, MD, 2 Units at 02/20/13 0015 mupirocin ointment  (BACTROBAN) 2 % 1 application, 1 application, Nasal, BID, Zigmund Gottron, MD, 1 application at 02/20/13 1121;  predniSONE (DELTASONE) tablet 20 mg, 20 mg, Oral, Daily, Layne Benton, NP, 20 mg at 02/20/13 1122;  Rivaroxaban (XARELTO) tablet 20 mg, 20 mg, Oral, Q supper, Micki Riley, MD  Patients Current Diet: Dysphagia  Precautions / Restrictions Precautions Precautions: Fall Restrictions Weight Bearing Restrictions: No   Prior Activity Level Community (5-7x/wk): Went out 5 X a week.  Home Assistive Devices / Equipment Home Assistive Devices/Equipment: None  Prior Functional Level Prior Function Level of Independence: Independent Driving: No Vocation: Student Comments: pt indicates she didn't need A with anything PTA.   Current Functional Level Cognition  Arousal/Alertness: Awake/alert Overall Cognitive Status: Impaired Overall Cognitive Status: Impaired Current Attention Level: Sustained Orientation Level: Oriented X4 Following Commands: Follows one step commands with increased time Safety/Judgement: Decreased awareness of need for assistance;Decreased safety judgement for tasks assessed;Decreased awareness of safety precautions Cognition - Other Comments: Difficult to assess cognition as pt only occasionally answering questions.  At times pt seems cognitively intact, but other times pt seems to have decreased cognition.   Attention: Focused;Sustained Focused Attention: Impaired Focused Attention Impairment: Verbal basic;Functional basic Sustained Attention: Impaired Sustained Attention Impairment: Verbal basic;Functional basic Memory: Impaired Memory Impairment: Decreased recall of new information;Decreased short term memory Decreased Short Term Memory: Verbal complex Awareness: Impaired Awareness Impairment: Intellectual impairment (primarily of cognitive related deficit) Problem Solving: Impaired Problem Solving Impairment: Functional basic Executive  Function: Initiating;Self Monitoring;Self Correcting;Decision Making Decision Making: Impaired Decision Making Impairment: Functional basic Initiating: Impaired Initiating Impairment: Functional basic Self Monitoring: Impaired Self Monitoring Impairment: Functional basic Self Correcting: Impaired Self Correcting Impairment: Functional basic Behaviors: Impulsive (periods of decreased initiation as well as impulsivity) Safety/Judgment: Impaired    Extremity Assessment (includes Sensation/Coordination)  RUE ROM/Strength/Tone: WFL for tasks assessed;Due to impaired cognition RUE Sensation: WFL - Light Touch RUE Coordination: WFL - gross motor;WFL - fine motor (holding cracker and pincher grasp)  RLE ROM/Strength/Tone: Deficits RLE ROM/Strength/Tone Deficits: Actively moves R LE against gravity, but doesn't follow all directions to allow for testing.   RLE Sensation: WFL - Light Touch    ADLs  Eating/Feeding: Minimal assistance (cues to initiate and sustain task) Where Assessed - Eating/Feeding: Wheelchair Grooming: Moderate assistance;Wash/dry face Where Assessed - Grooming: Supported sitting (ignoring Lt side of mouth) Toilet Transfer: +2 Total assistance Toilet Transfer: Patient Percentage: 30% Equipment Used: Gait belt;Wheelchair Transfers/Ambulation Related to ADLs: pt completed stand pivot to the right to w/c for transfer to new room. pt with no AROM noted on Lt side ADL Comments: Pt sitting on EOB with Rt posterior lean with (A) to remain sitting. pt with right gaze deviation and neck rotation to the right. pt scanning to midline and jumping back to the right. pt looking to the left to locate therapist standing on the left. Pt with incr  response when therapist standing on Rt. pt needed directive cues at time during session to progress patient. pt with incr verbalizations with prolonged sitting    Mobility  Bed Mobility: Sitting - Scoot to Edge of Bed;Supine to Sit Supine to Sit:  1: +2 Total assist;HOB elevated;With rails Supine to Sit: Patient Percentage: 30% Sitting - Scoot to Edge of Bed: 1: +2 Total assist Sitting - Scoot to Edge of Bed: Patient Percentage: 0%    Transfers  Transfers: Sit to Stand;Stand to [employer] Transfers Sit to Stand: 1: +2 Total assist;From bed Sit to Stand: Patient Percentage: 30% Stand to Sit: 1: +2 Total assist;With armrests;To chair/3-in-1 Stand to Sit: Patient Percentage: 30% Stand Pivot Transfers: 1: +2 Total assist Stand Pivot Transfers: Patient Percentage: 30%    Ambulation / Gait / Stairs / Wheelchair Mobility  Ambulation/Gait Ambulation/Gait Assistance: Not tested (comment) Stairs: No Wheelchair Mobility Wheelchair Mobility: No    Posture / Games developer Sitting - Balance Support: Right upper extremity supported;Feet supported Static Sitting - Level of Assistance: 2: Max assist Static Sitting - Comment/# of Minutes: Pt with poor postural/cervical control/LOB in all directions.      Special needs/care consideration BiPAP/CPAP No CPM No Continuous Drip IV No Dialysis No        Life Vest No Oxygen No Special Bed No Trach Size No Wound Vac (area) No       Skin No                              Bowel mgmt:No BM since admission Bladder mgmt: Voiding on bedpan with some accidents. Diabetic mgmt No   Previous Home Environment Living Arrangements: Alone (Has an 44 yo son who is in college.) Lives With: Alone Available Help at Discharge: Family;Available 24 hours/day Type of Home: Apartment Home Layout: Two level;1/2 bath on main level (Lives in a townhouse.) Home Access: Stairs to enter [employer] of Steps: 2 at front and none at the back. Home Care Services: No Additional Comments: pt only responding to some questions.  pt indicates she is currently enrolled in school online and has an 34yr old son who is enrolled at [employer].  Unclear if he lives with pt or if  anyone lives with pt.    Discharge Living Setting Plans for Discharge Living Setting: House;Lives with (comment) (Will go home with mother.) Type of Home at Discharge: House Discharge Home Layout: Able to live on main level with bedroom/bathroom Discharge Home Access: Stairs to enter Entrance Stairs-Number of Steps: 4 Do you have any problems obtaining your medications?: No  Social/Family/Support Systems Patient Roles: Parent Contact Information: Consuella Lose - mother Anticipated Caregiver: Mom Anticipated Caregiver's Contact Information: Corrie Dandy - (h) [phone] ([phone] Ability/Limitations of Caregiver: Mother works from her home and can assist.  Can provide supervision.  Mom has had previous CVA. Caregiver Availability: 24/7 Discharge Plan Discussed with Primary Caregiver: Yes Is Caregiver In Agreement with Plan?: Yes Does Caregiver/Family have Issues with Lodging/Transportation while Pt is in Rehab?: No  Goals/Additional Needs Patient/Family Goal for Rehab: PT/OT min to mod A, ST S/Min A goals Expected length of stay: 3 weeks Cultural Considerations: None Dietary Needs: Dys 3, thin liquids Equipment Needs: TBD Pt/Family Agrees to Admission and willing to participate: Yes Program Orientation Provided & Reviewed with Pt/Caregiver Including Roles  & Responsibilities: Yes   Decrease burden of Care through IP rehab admission: Diet advancement,  Decrease number of caregivers and Patient/family education  Possible need for SNF placement upon discharge: Yes, if patient does not progress sufficiently to point where mother can manage at home.  Patient Condition: This patient's condition remains as documented in the Consult dated 02/19/13, in which the Rehabilitation Physician determined and documented that the patient's condition is appropriate for intensive rehabilitative care in an inpatient rehabilitation facility.  Bed not available on 03/28, so planning inpatient rehab admission for  02/21/13.  Preadmission Screen Completed By:  Trish Mage, 02/20/2013 1:24 PM ______________________________________________________________________   Discussed status with Dr. Riley Kill on 02/20/13 at 1321 and received telephone approval for admission tomorrow, Saturday, 02/21/13.  Admission Coordinator:  Trish Mage, time1325/Date03/28/14

## 2013-02-20 NOTE — Progress Notes (Signed)
Rehab admissions - I am planning to admit patient to inpatient rehab tomorrow, Saturday.  After discussion with mom, sister, and patient, I have determined that patient has Medicare and not [employer].  Her Humana is for prescription drug coverage only.  Call me for questions.  #[phone]

## 2013-02-20 NOTE — Progress Notes (Signed)
Stroke Team Progress Note  HISTORY [name] is an 37 y.o. 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 Onset is unclear. Deficits were noted early this afternoon 02/15/2013 when the mother went over to check on her because of apparent confusion described by neighbors. Patient has been on anticoagulation with Coumadin and has a history of less than ideal compliance. She states that she's been taking Coumadin. However, INR today was 1.14. No speech changes of the noted by the patient nor patient's mother. CT scan of the head showed an area of acute ischemia involving the right MCA territory, the majority of which appear to be penumbra and potentially reversible. 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 Filling defect involving the innominate artery was noted and thought to likely represent thrombus. The innominate artery was patent along with right common carotid artery. There was also occlusion of the right cavernous carotid and right M1 segment indicative embolic phenomena. NIH stroke scale was 8. Patient was not a TPA candidate secondary to delay in arrival as well as stroke within the past 3 months. She was admitted to the neuro ICU for further evaluation and treatment.  SUBJECTIVE Patient's sister from [place] is at the bedside.spoke to sister at length about prognosis and plan of care.  OBJECTIVE Most recent Vital Signs: Filed Vitals:   02/20/13 0200 02/20/13 0600 02/20/13 0802 02/20/13 0930  BP: 117/90 125/62 133/67 121/73  Pulse: 78 62 65 65  Temp: 99 F (37.2 C) 98 F (36.7 C)  98.2 F (36.8 C)  TempSrc:    Axillary  Resp: 18 18 16 18   Height:      Weight:  70.1 kg (154 lb 8.7 oz)    SpO2: 98% 99% 99% 100%   CBG (last 3)   Recent Labs  02/20/13 0009  02/20/13 0412 02/20/13 0805  GLUCAP 133* 91 91   IV Fluid Intake:      MEDICATIONS  . aspirin  325 mg Oral Daily   Or  . aspirin  300 mg Rectal Daily  . Chlorhexidine Gluconate Cloth  6 each Topical Q0600  . feeding supplement  1 Container Oral BID BM  . insulin aspart  0-15 Units Subcutaneous Q4H  . mupirocin ointment  1 application Nasal BID  . predniSONE  20 mg Oral Daily  . warfarin  6 mg Oral ONCE-1800  . warfarin   Does not apply Once  . Warfarin - Pharmacist Dosing Inpatient   Does not apply q1800   PRN:  acetaminophen, diphenhydrAMINE, fentaNYL  Diet:  Dysphagia 3 thin liquids Activity: OOB DVT Prophylaxis:  SCDs   CLINICALLY SIGNIFICANT STUDIES Basic Metabolic Panel:   Recent Labs Lab 02/18/13 0405 02/19/13 0505  NA 140 141  K 3.9 3.3*  CL 107 106  CO2 21 25  GLUCOSE 109* 81  BUN 7 9  CREATININE 0.65 0.72  CALCIUM 8.9 9.0   Liver Function Tests:   Recent Labs Lab 02/15/13 1943  AST 18  ALT 8  ALKPHOS 80  BILITOT 0.2*  PROT 7.6  ALBUMIN 3.2*   CBC:   Recent Labs Lab 02/15/13 1943  02/16/13 0500  02/18/13 0405 02/19/13 0505  WBC 18.1*  --  12.7*  < > 20.0* 15.8*  NEUTROABS 15.3*  --  9.4*  --   --   --  HGB 12.0  < > 10.6*  < > 7.7* 8.3*  HCT 36.8  < > 31.7*  < > 23.9* 26.3*  MCV 77.6*  --  75.8*  < > 77.3* 77.6*  PLT 284  --  295  < > 276 314  < > = values in this interval not displayed. Coagulation:   Recent Labs Lab 02/15/13 1943 02/20/13 0445  LABPROT 14.4 14.4  INR 1.14 1.14   Cardiac Enzymes:   Recent Labs Lab 02/15/13 1944  TROPONINI <0.30   Urinalysis:   Recent Labs Lab 02/18/13 1536  COLORURINE YELLOW  LABSPEC 1.017  PHURINE 6.0  GLUCOSEU NEGATIVE  HGBUR MODERATE*  BILIRUBINUR NEGATIVE  KETONESUR 40*  PROTEINUR NEGATIVE  UROBILINOGEN 1.0  NITRITE NEGATIVE  LEUKOCYTESUR NEGATIVE   Lipid Panel    Component Value Date/Time   CHOL 151 02/16/2013 0500   TRIG 166* 02/16/2013 0500   HDL 60 02/16/2013 0500    CHOLHDL 2.5 02/16/2013 0500   VLDL 33 02/16/2013 0500   LDLCALC 58 02/16/2013 0500   HgbA1C  Lab Results  Component Value Date   HGBA1C 6.0* 02/16/2013   Urine Drug Screen:   No results found for this basename: labopia,  cocainscrnur,  labbenz,  amphetmu,  thcu,  labbarb    Alcohol Level: No results found for this basename: ETH,  in the last 168 hours  CT of the brain  02/16/2013  Low attenuation in the right middle cerebral artery distribution compatible with acute / subacute infarction.  No definite hemorrhage.  Probable small area of contrast staining in the insula.  Redundant loop of the nasogastric tube is present in the hypopharynx.  Adjustment is recommended.     Ct Angio Head W/cm &/or Wo Cm 02/15/2013  CT indicates  low density edema in the right insula suggestive of acute infarct.  Occlusion of the right cavernous carotid and right M1 segment suggestive of acute embolus.  There is a filling defect in the innominate artery which may be the source of embolus.  Chronic occlusion left carotid artery with reconstitution at the skull base and good perfusion of left M1 and A1 segments on the left.  Small left vertebral artery which is occluded proximally but reconstitutes in the mid neck.    CT Angio Neck W/cm &/or Wo/cm 02/15/2013 Chronic occlusion of the left carotid artery, left subclavian artery, and left vertebral artery as seen on prior studies due to arteritis.  Filling defect in the innominate artery may be due to fresh thrombus.  The right carotid bifurcation is widely patent.  There is occlusion of the right cavernous carotid and right M1 segment suggesting an embolus.  There is flow and right middle cerebral artery branches.    CT Cerebral Perfusion W/cm 02/15/2013  Acute infarct right MCA territory.  Findings suggest majority of this may be penumbra and could potentially be reversible.    Cerebral Angiogram partial recanalization of occluded distal RT ICA Using trevoprovue  thrombectomy. device.  MRI of the brain 02/17/2013  Acute infarct right MCA territory without hemorrhage. Scattered small areas of acute infarct left frontal and parietal lobe.  MRA of the brain  See angio  2D Echocardiogram  EF 60% with no source of embolus.   Carotid Doppler  Right: No evidence of hemodynamically significant internal carotid artery stenosis. Left: CCA appears occluded. The ECA appears to be feeding the ICA. Vertebral artery appears occluded.   CT Pelvis 02/18/2013 Mild hemorrhage around the puncture site in the right  common femoral artery. Tiny amount of retroperitoneal hemorrhage in the pelvis. No large hematoma.   CXR  02/18/2013 no acute disease 02/17/2013 1. Stable support apparatus. 2. Mild right basilar atelectasis.  02/16/2013   1.  Endotracheal tube 45 mm from the carina.  Good position. 2.  Nasogastric tube proximal side port at the gastroesophageal junction.  Consider advancing the NG tube without 3 cm to prevent gastroesophageal reflux. 3.  No active cardiopulmonary disease.   EKG  normal sinus rhythm.   Therapy Recommendations CIR  Physical Exam   Afebrile. Head is nontraumatic. Neck is supple without bruit.  Cardiac exam no murmur or gallop. Lungs are clear to auscultation. Distal pulses are well felt.bilateral groin arterial sheaths present.  Neurological Exam quiet speaks little.Eyes are closed Right gaze deviation. Doll's eye movements absent to the left. Pupils 4 mm equal reactive. Corneal reflexes are present. Has weak cough and gag. Left lower facial weakness. Tongue is midline. She has purposeful and localizing movements on the right side to sternal rub and noxious stimuli. No left upper extremity movement to pain. Partial left lower extremity withdrawal to painful stimuli. Left plantar is upgoing. Right is downgoing.  ASSESSMENT [name] is a 38 y.o. female presenting with confusion,  headache for several days and new onset left facial and  extremity weakness. Initial imaging confirms a right MCA territory infarct in the setting of Takayasu's's arteritis and hypercoagulability from lupus anticoagulant. MRI clarified left frontal and parietal lobe embolic infarcts. Infarct felt to be due to with occlusion of the right cavernous carotid and right M1 segment associated with lupus anticoagulant. On warfarin prior to admission; subtherapeutic INR on arrival 1.14 Now on warfarin that she is refusing to take for secondary stroke prevention. Patient with resultant left hemiparesis. Work up completed.   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 Urine cx neg. CXR neg. ? Reaction to steroids (prescribed at home PTA, likely non complaint as she was not taking her warfarin as well)  Microcytic anemia, improved to Hgb 7.7  Small right retroperitoneal hemorrhage  Hypertension, resolved  MRSA positive nasal swab, on isolation  LDL 58  HgbA1c 6.0  Hospital day # 5  TREATMENT/PLAN  Change anticoagulant to  xarelto 15 mg daily for secondary stroke prevention as patient refused to take warfarin. She is willing to try another agent.  Discontinue telemetry  Await disposition - CIR consult in place  D/w patient and sister and answered questions.  Annie Main, MSN, RN, ANVP-BC, ANP-BC, [facility] Pager: [phone] 02/20/2013 10:18 AM  I have personally obtained a history, examined the patient, evaluated imaging results,  and formulated the assessment and plan of care. I agree with the above.  Delia Heady, MD

## 2013-02-20 NOTE — Progress Notes (Signed)
Physical Therapy Treatment Patient Details Name: CAMIAH HUMM MRN: [id number] DOB: [birth year] Today's Date: 02/20/2013 Time: 4098-1191 PT Time Calculation (min): 24 min  PT Assessment / Plan / Recommendation Comments on Treatment Session  pt presents with R MCA and recent L PCA infarcts.  pt needs encouragement to participate and continues to have very flat affect.  ? cognitive deficits vs affect and behavior.  pt did demo improvement with mobility today.      Follow Up Recommendations  CIR     Does the patient have the potential to tolerate intense rehabilitation     Barriers to Discharge        Equipment Recommendations  None recommended by PT    Recommendations for Other Services Rehab consult  Frequency Min 4X/week   Plan Discharge plan remains appropriate;Frequency remains appropriate    Precautions / Restrictions Precautions Precautions: Fall Restrictions Weight Bearing Restrictions: No   Pertinent Vitals/Pain Did not indicate pain.      Mobility  Bed Mobility Bed Mobility: Supine to Sit;Sitting - Scoot to Edge of Bed Supine to Sit: 2: Max assist;HOB elevated Sitting - Scoot to [place] of Bed: 2: Max assist Details for Bed Mobility Assistance: Max directional cues for sequencing & technique.  Pt reached across for rail on Lt side with Rt UE but poor problem solving with use of UE to assist with sitting upright.     Transfers Transfers: Sit to Stand;Stand to Sit;Stand Pivot Transfers Sit to Stand: 1: +2 Total assist;With upper extremity assist;From bed Sit to Stand: Patient Percentage: 40% Stand to Sit: 1: +2 Total assist;With upper extremity assist;To chair/3-in-1 Stand to Sit: Patient Percentage: 40% Stand Pivot Transfers: 1: +2 Total assist Stand Pivot Transfers: Patient Percentage: 30% Details for Transfer Assistance: cues for use of R UE on armrests, step-by-step movements through SPT.   Ambulation/Gait Ambulation/Gait Assistance: Not tested (comment) Stairs:  No Wheelchair Mobility Wheelchair Mobility: No Modified Rankin (Stroke Patients Only) Pre-Morbid Rankin Score: No symptoms Modified Rankin: Severe disability    Exercises     PT Diagnosis:    PT Problem List:   PT Treatment Interventions:     PT Goals Acute Rehab PT Goals Time For Goal Achievement: 03/04/13 Potential to Achieve Goals: Good PT Goal: Supine/Side to Sit - Progress: Progressing toward goal PT Goal: Sit at Edge Of Bed - Progress: Progressing toward goal PT Goal: Sit to Stand - Progress: Progressing toward goal PT Goal: Stand to Sit - Progress: Progressing toward goal PT Transfer Goal: Bed to Chair/Chair to Bed - Progress: Progressing toward goal  Visit Information  Last PT Received On: 02/20/13 Assistance Needed: +2    Subjective Data  Subjective: I'll get up if you want me to.     Cognition  Cognition Overall Cognitive Status: Impaired Area of Impairment: Attention;Following commands;Safety/judgement Arousal/Alertness: Awake/alert Behavior During Session: Flat affect Current Attention Level: Sustained Following Commands: Follows one step commands with increased time Safety/Judgement: Decreased awareness of need for assistance;Decreased safety judgement for tasks assessed;Decreased awareness of safety precautions Cognition - Other Comments: Difficult to assess cognition as pt only occasionally answering questions.  At times pt seems cognitively intact, but other times pt seems to have decreased cognition.      Balance  Balance Balance Assessed: Yes Static Sitting Balance Static Sitting - Balance Support: Right upper extremity supported;Feet supported Static Sitting - Level of Assistance: 3: Mod assist;2: Max assist Static Sitting - Comment/# of Minutes: pt fluctuates between Mod and MaxA to maintain sitting balance.  MAx cueing needed  to correct balance and maintain longer than only a few seconds.  Attempted reaching with R UE towards R side and anteriorly.     End of Session PT - End of Session Equipment Utilized During Treatment: Gait belt Activity Tolerance: Patient limited by fatigue Patient left: in chair;with call bell/phone within reach;with family/visitor present Nurse Communication: Mobility status   GP     Sunny Schlein, [place] [phone] 02/20/2013, 2:27 PM

## 2013-02-20 NOTE — H&P (Signed)
Physical Medicine and Rehabilitation Admission H&P    Chief Complaint  Patient presents with  . Cerebrovascular Accident  : HPI: [name] is a 37 y.o. 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 Admitted 02/16/2013 with complaints of headache and left-sided weakness with altered mental status. INR on admission of 1.14 with noted poor compliance on Coumadin. MRI of the brain showed acute infarct right MCA territory without hemorrhage as well as scattered small areas of acute infarct left frontal and parietal lobe. Patient did not receive TPA. Echocardiogram with ejection fraction of 60% without embolism. 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 There was a filling defect involving the innominate artery thought likely to represent thrombus. There was also occlusion of the right cavernous carotid and right M1 segment indicate of embolic phenomenon. Underwent ICA thrombectomy 02/16/2013 per interventional radiology. Patient had been placed on aspirin therapy per neurology services and Coumadin later resumed of which patient adamantly refused and patient was placed thus patient was placed on Xarelto for stroke prevention 02/20/2013 . Currently maintained on a dysphagia 3 thin liquid diet. Physical and occupational therapy evaluations completed 02/18/2013 with recommendations of physical medicine rehabilitation consult to consider inpatient rehabilitation services. Patient was felt to be a good candidate for inpatient rehabilitation services and was admitted for comprehensive rehabilitation program today.   Review of Systems  Neurological. Headaches/dizziness/weakness Unable to perform ROS   Past Medical History  Diagnosis Date  . PVD (peripheral vascular disease)   . Benign  tumor of back   . Swelling, lymph nodes 2006    intermittant, benign  . DVT (deep venous thrombosis)   . Stroke     last week here   Past Surgical History  Procedure Laterality Date  . Ivc filter placement    . Breast surgery      bil breast implants   Family History  Problem Relation Age of Onset  . Cancer - Other Father   . Coronary artery disease Mother   . Stroke Mother   . Hypertension Mother   . Hypertension Sister    Social History:  reports that she has never smoked. She has never used smokeless tobacco. She reports that she drinks about 3.0 ounces of alcohol per week. She reports that she does not use illicit drugs. Allergies: No Known Allergies Medications Prior to Admission  Medication Sig Dispense Refill  . ALPRAZolam (XANAX) 1 MG tablet Take 1 tablet (1 mg total) by mouth at bedtime as needed for anxiety.  20 tablet  0  . predniSONE (DELTASONE) 20 MG tablet Take 20 mg by mouth daily.       Marland Kitchen warfarin (COUMADIN) 7.5 MG tablet Take 3.75 mg by mouth daily.       Marland Kitchen zolpidem (AMBIEN) 10 MG tablet Take 1 tablet (10 mg total) by mouth at bedtime as needed for sleep.  20 tablet  0    Home: Home Living Additional Comments: pt only responding to some questions.  pt indicates she is currently enrolled in school online and has an 17yr old son who is enrolled at [employer].  Unclear if he lives with pt or if anyone lives with pt.     Functional History: Prior Function Driving: No Vocation: Student Comments: pt indicates she didn't need A with anything PTA.  Functional Status:  Mobility: Bed Mobility Bed Mobility: Sitting - Scoot to Edge of Bed;Supine to Sit Supine to Sit: 1: +2 Total assist;HOB elevated;With rails Supine to Sit: Patient Percentage: 30% Sitting - Scoot to Edge of Bed: 1: +2 Total assist Sitting - Scoot to Edge of Bed: Patient Percentage: 0% Transfers Transfers: Sit to Stand;Stand to [employer] Transfers Sit to Stand: 1: +2 Total  assist;From bed Sit to Stand: Patient Percentage: 30% Stand to Sit: 1: +2 Total assist;With armrests;To chair/3-in-1 Stand to Sit: Patient Percentage: 30% Stand Pivot Transfers: 1: +2 Total assist Stand Pivot Transfers: Patient Percentage: 30% Ambulation/Gait Ambulation/Gait Assistance: Not tested (comment) Stairs: No Wheelchair Mobility Wheelchair Mobility: No  ADL: ADL Eating/Feeding: Minimal assistance (cues to initiate and sustain task) Where Assessed - Eating/Feeding: Wheelchair Grooming: Moderate assistance;Wash/dry face Where Assessed - Grooming: Supported sitting (ignoring Lt side of mouth) Toilet Transfer: +2 Total assistance Equipment Used: Gait belt;Wheelchair Transfers/Ambulation Related to ADLs: pt completed stand pivot to the right to w/c for transfer to new room. pt with no AROM noted on Lt side ADL Comments: Pt sitting on EOB with Rt posterior lean with (A) to remain sitting. pt with right gaze deviation and neck rotation to the right. pt scanning to midline and jumping back to the right. pt looking to the left to locate therapist standing on the left. Pt with incr response when therapist standing on Rt. pt needed directive cues at time during session to progress patient. pt with incr verbalizations with prolonged sitting  Cognition: Cognition Overall Cognitive Status: Impaired Arousal/Alertness: Awake/alert Orientation Level: Oriented X4 Attention: Focused;Sustained Focused Attention: Impaired Focused Attention Impairment: Verbal basic;Functional basic Sustained Attention: Impaired Sustained Attention Impairment: Verbal basic;Functional basic Memory: Impaired Memory Impairment: Decreased recall of new information;Decreased short term memory Decreased Short Term Memory: Verbal complex Awareness: Impaired Awareness Impairment: Intellectual impairment (primarily of cognitive related deficit) Problem Solving: Impaired Problem Solving Impairment: Functional  basic Executive Function: Initiating;Self Monitoring;Self Correcting;Decision Making Decision Making: Impaired Decision Making Impairment: Functional basic Initiating: Impaired Initiating Impairment: Functional basic Self Monitoring: Impaired Self Monitoring Impairment: Functional basic Self Correcting: Impaired Self Correcting Impairment: Functional basic Behaviors: Impulsive (periods of decreased initiation as well as impulsivity) Safety/Judgment: Impaired Cognition Overall Cognitive Status: Impaired Area of Impairment: Attention;Following commands;Safety/judgement Arousal/Alertness: Awake/alert Behavior During Session: Flat affect Current Attention Level: Sustained Following Commands: Follows one step commands with increased time Safety/Judgement: Decreased awareness of need for assistance;Decreased safety judgement for tasks assessed;Decreased awareness of safety precautions Cognition - Other Comments: Difficult to assess cognition as pt only occasionally answering questions.  At times pt seems cognitively intact, but other times pt seems to have decreased cognition.    Physical Exam: Blood pressure 121/73, pulse 65, temperature 98.2 F (36.8 C), temperature source Axillary, resp. rate 18, height 5\' 8"  (1.727 m), weight 70.1 kg (154 lb 8.7 oz), last menstrual period 02/15/2013, SpO2 100.00%.  HENT: oral mucosa generally pink and moist. Head: Normocephalic.  Eyes:  Pupils reactive to light  Neck: Neck supple. No thyromegaly present.  Cardiovascular: Normal rate and regular rhythm. No murmur or gallop Pulmonary/Chest: Effort normal and breath sounds normal. No respiratory distress. No wheezes or rales Abdominal: Soft. Bowel sounds are normal. She exhibits no distension.  Neurological: alert, flat  Patient  needed Max verbal cues for initiation. She would not follow commands consistently. Appeared disinterested. POOR insight and awareness.   Left inattention. Still no volitional  movement in left arm or leg. Does sense pain on left but has difficulty  discerning fine touch. No sensory deficits over face. Trace tone in left leg. dtr's 2++, a beat or two of clonus left foot.  Skin: Skin is warm and dry.  Psychiatric:  Flat    Results for orders placed during the hospital encounter of 02/15/13 (from the past 48 hour(s))  GLUCOSE, CAPILLARY     Status: None   Collection Time    02/18/13 11:38 AM      Result Value Range   Glucose-Capillary 94  70 - 99 mg/dL  URINALYSIS, ROUTINE W REFLEX MICROSCOPIC     Status: Abnormal   Collection Time    02/18/13  3:36 PM      Result Value Range   Color, Urine YELLOW  YELLOW   APPearance CLEAR  CLEAR   Specific Gravity, Urine 1.017  1.005 - 1.030   pH 6.0  5.0 - 8.0   Glucose, UA NEGATIVE  NEGATIVE mg/dL   Hgb urine dipstick MODERATE (*) NEGATIVE   Bilirubin Urine NEGATIVE  NEGATIVE   Ketones, ur 40 (*) NEGATIVE mg/dL   Protein, ur NEGATIVE  NEGATIVE mg/dL   Urobilinogen, UA 1.0  0.0 - 1.0 mg/dL   Nitrite NEGATIVE  NEGATIVE   Leukocytes, UA NEGATIVE  NEGATIVE  URINE MICROSCOPIC-ADD ON     Status: Abnormal   Collection Time    02/18/13  3:36 PM      Result Value Range   Squamous Epithelial / LPF RARE  RARE   WBC, UA 0-2  <3 WBC/hpf   RBC / HPF 11-20  <3 RBC/hpf   Bacteria, UA RARE  RARE   Casts HYALINE CASTS (*) NEGATIVE   Urine-Other MUCOUS PRESENT    GLUCOSE, CAPILLARY     Status: Abnormal   Collection Time    02/18/13  4:38 PM      Result Value Range   Glucose-Capillary 155 (*) 70 - 99 mg/dL  GLUCOSE, CAPILLARY     Status: Abnormal   Collection Time    02/18/13  7:59 PM      Result Value Range   Glucose-Capillary 125 (*) 70 - 99 mg/dL  GLUCOSE, CAPILLARY     Status: Abnormal   Collection Time    02/19/13 12:40 AM      Result Value Range   Glucose-Capillary 107 (*) 70 - 99 mg/dL  GLUCOSE, CAPILLARY     Status: None   Collection Time    02/19/13  4:09 AM      Result Value Range   Glucose-Capillary 92  70  - 99 mg/dL  BASIC METABOLIC PANEL     Status: Abnormal   Collection Time    02/19/13  5:05 AM      Result Value Range   Sodium 141  135 - 145 mEq/L   Potassium 3.3 (*) 3.5 - 5.1 mEq/L   Chloride 106  96 - 112 mEq/L   CO2 25  19 - 32 mEq/L   Glucose, Bld 81  70 - 99 mg/dL   BUN 9  6 - 23 mg/dL   Creatinine, Ser 4.09  0.50 - 1.10 mg/dL   Calcium 9.0  8.4 - 81.1 mg/dL   GFR calc non Af Amer >90  >90 mL/min   GFR calc Af Amer >90  >90 mL/min   Comment:            The eGFR has been calculated     using the CKD EPI equation.     This calculation has not been  validated in all clinical     situations.     eGFR's persistently     <90 mL/min signify     possible Chronic Kidney Disease.  CBC     Status: Abnormal   Collection Time    02/19/13  5:05 AM      Result Value Range   WBC 15.8 (*) 4.0 - 10.5 K/uL   RBC 3.39 (*) 3.87 - 5.11 MIL/uL   Hemoglobin 8.3 (*) 12.0 - 15.0 g/dL   HCT 16.1 (*) 09.6 - 04.5 %   MCV 77.6 (*) 78.0 - 100.0 fL   MCH 24.5 (*) 26.0 - 34.0 pg   MCHC 31.6  30.0 - 36.0 g/dL   RDW 40.9 (*) 81.1 - 91.4 %   Platelets 314  150 - 400 K/uL  GLUCOSE, CAPILLARY     Status: None   Collection Time    02/19/13  8:34 AM      Result Value Range   Glucose-Capillary 84  70 - 99 mg/dL  GLUCOSE, CAPILLARY     Status: None   Collection Time    02/19/13 11:20 AM      Result Value Range   Glucose-Capillary 94  70 - 99 mg/dL  GLUCOSE, CAPILLARY     Status: Abnormal   Collection Time    02/19/13  4:13 PM      Result Value Range   Glucose-Capillary 176 (*) 70 - 99 mg/dL  GLUCOSE, CAPILLARY     Status: Abnormal   Collection Time    02/19/13  8:22 PM      Result Value Range   Glucose-Capillary 128 (*) 70 - 99 mg/dL  GLUCOSE, CAPILLARY     Status: Abnormal   Collection Time    02/20/13 12:09 AM      Result Value Range   Glucose-Capillary 133 (*) 70 - 99 mg/dL  GLUCOSE, CAPILLARY     Status: None   Collection Time    02/20/13  4:12 AM      Result Value Range    Glucose-Capillary 91  70 - 99 mg/dL  PROTIME-INR     Status: None   Collection Time    02/20/13  4:45 AM      Result Value Range   Prothrombin Time 14.4  11.6 - 15.2 seconds   INR 1.14  0.00 - 1.49  GLUCOSE, CAPILLARY     Status: None   Collection Time    02/20/13  8:05 AM      Result Value Range   Glucose-Capillary 91  70 - 99 mg/dL   Ct Pelvis Wo Contrast  02/19/2013  *RADIOLOGY REPORT*  Clinical Data: Recent catheterization.  Retroperitoneal hematoma.  CT PELVIS WITHOUT CONTRAST  Technique:  Multidetector CT imaging of the pelvis was performed following the standard protocol without intravenous contrast.  Comparison: None.  Findings: There is a small amount of hemorrhage around the right common femoral artery and neurovascular bundle.  Tiny amount of retroperitoneal hemorrhage is present in the pelvis without cranial extension along the iliopsoas.  There is respiratory motion artifact present on the examination.  Small amount of free fluid is present in the anatomic pelvis.  Retroverted, retroflexed uterus. Benign appearing cystic lesion in the left adnexa for which no further evaluation is required.  Air is present within the urinary bladder, likely iatrogenic.  Bones appear within normal limits.  Compared to prior plain films, the catheters have been removed.  IMPRESSION: Mild hemorrhage around the puncture site in the right  common femoral artery.  Tiny amount of retroperitoneal hemorrhage in the pelvis.  No large hematoma.   Original Report Authenticated By: Andreas Newport, M.D.    Dg Chest Port 1 View  02/18/2013  *RADIOLOGY REPORT*  Clinical Data: Right side chest pain.  PORTABLE CHEST - 1 VIEW  Comparison: Plain film chest 02/18/2013 AT chest 11/03/2012.  Findings: Bilateral breast implants are noted.  Lungs are clear. Heart size is normal.  No pneumothorax or pleural effusion.  IMPRESSION: No acute disease.   Original Report Authenticated By: Holley Dexter, M.D.     Post Admission  Physician Evaluation: 1. Functional deficits secondary  to thromboembolic right MCA infarct. 2. Patient is admitted to receive collaborative, interdisciplinary care between the physiatrist, rehab nursing staff, and therapy team. 3. Patient's level of medical complexity and substantial therapy needs in context of that medical necessity cannot be provided at a lesser intensity of care such as a SNF. 4. Patient has experienced substantial functional loss from his/her baseline which was documented above under the "Functional History" and "Functional Status" headings.  Judging by the patient's diagnosis, physical exam, and functional history, the patient has potential for functional progress which will result in measurable gains while on inpatient rehab.  These gains will be of substantial and practical use upon discharge  in facilitating mobility and self-care at the household level. 5. Physiatrist will provide 24 hour management of medical needs as well as oversight of the therapy plan/treatment and provide guidance as appropriate regarding the interaction of the two. 6. 24 hour rehab nursing will assist with bladder management, bowel management, safety, skin/wound care, disease management, medication administration, pain management and patient education  and help integrate therapy concepts, techniques,education, etc. 7. PT will assess and treat for/with: Lower extremity strength, range of motion, stamina, balance, functional mobility, safety, adaptive techniques and equipment, NMR, cognitive perceptual awareness, visual perceptual awareness ,education.   Goals are: minimal to moderate assist. 8. OT will assess and treat for/with: ADL's, functional mobility, safety, upper extremity strength, adaptive techniques and equipment, NMR, cognitive perceptual awareness, education.   Goals are: min to mod assist. 9. SLP will assess and treat for/with: swallowing, cognition, .  Goals are: supervision ?+. 10. Case  Management and Social Worker will assess and treat for psychological issues and discharge planning. 11. Team conference will be held weekly to assess progress toward goals and to determine barriers to discharge. 12. Patient will receive at least 3 hours of therapy per day at least 5 days per week. 13. ELOS: 3-4 weeks      Prognosis:  good   Medical Problem List and Plan: 1. Thromboembolic right MCA infarct 2. DVT Prophylaxis/Anticoagulation: IVC filter/ Xarelto. Monitor for any signs of bleeding 3. Neuropsych: This patient is not capable of making decisions on his/her own behalf.  -will need constant positive reinforcement and education  -mother appears quite positive and supportive however 4. History of lupus. Continue prednisone (pt denies dx) 5. MRSA positive nasal swab. Contact precautions 6. History of medical noncompliance. Counseling 7. Microcytic anemia. Latest hemoglobin 8.3. Followup CBC  Ranelle Oyster, MD, [place] [facility]  02/20/2013

## 2013-02-21 ENCOUNTER — Inpatient Hospital Stay (HOSPITAL_COMMUNITY): Payer: Medicare Other

## 2013-02-21 ENCOUNTER — Encounter (HOSPITAL_COMMUNITY): Payer: Self-pay | Admitting: *Deleted

## 2013-02-21 ENCOUNTER — Inpatient Hospital Stay (HOSPITAL_COMMUNITY)
Admission: AD | Admit: 2013-02-21 | Discharge: 2013-03-20 | DRG: 945 | Disposition: A | Discharge status: Home Health | Payer: Medicare Other | Source: Intra-hospital | Attending: Physical Medicine & Rehabilitation | Admitting: Physical Medicine & Rehabilitation

## 2013-02-21 DIAGNOSIS — Z9119 Patient's noncompliance with other medical treatment and regimen: Secondary | ICD-10-CM

## 2013-02-21 DIAGNOSIS — I639 Cerebral infarction, unspecified: Secondary | ICD-10-CM

## 2013-02-21 DIAGNOSIS — K5909 Other constipation: Secondary | ICD-10-CM

## 2013-02-21 DIAGNOSIS — Z22322 Carrier or suspected carrier of Methicillin resistant Staphylococcus aureus: Secondary | ICD-10-CM

## 2013-02-21 DIAGNOSIS — Z91199 Patient's noncompliance with other medical treatment and regimen due to unspecified reason: Secondary | ICD-10-CM

## 2013-02-21 DIAGNOSIS — R030 Elevated blood-pressure reading, without diagnosis of hypertension: Secondary | ICD-10-CM

## 2013-02-21 DIAGNOSIS — R209 Unspecified disturbances of skin sensation: Secondary | ICD-10-CM

## 2013-02-21 DIAGNOSIS — Z823 Family history of stroke: Secondary | ICD-10-CM

## 2013-02-21 DIAGNOSIS — Z7901 Long term (current) use of anticoagulants: Secondary | ICD-10-CM

## 2013-02-21 DIAGNOSIS — Z5189 Encounter for other specified aftercare: Principal | ICD-10-CM

## 2013-02-21 DIAGNOSIS — Z79899 Other long term (current) drug therapy: Secondary | ICD-10-CM

## 2013-02-21 DIAGNOSIS — Z86718 Personal history of other venous thrombosis and embolism: Secondary | ICD-10-CM

## 2013-02-21 DIAGNOSIS — D509 Iron deficiency anemia, unspecified: Secondary | ICD-10-CM

## 2013-02-21 DIAGNOSIS — I749 Embolism and thrombosis of unspecified artery: Secondary | ICD-10-CM

## 2013-02-21 DIAGNOSIS — IMO0002 Reserved for concepts with insufficient information to code with codable children: Secondary | ICD-10-CM

## 2013-02-21 DIAGNOSIS — I6340 Cerebral infarction due to embolism of unspecified cerebral artery: Secondary | ICD-10-CM

## 2013-02-21 DIAGNOSIS — Z8249 Family history of ischemic heart disease and other diseases of the circulatory system: Secondary | ICD-10-CM

## 2013-02-21 DIAGNOSIS — I739 Peripheral vascular disease, unspecified: Secondary | ICD-10-CM

## 2013-02-21 DIAGNOSIS — M329 Systemic lupus erythematosus, unspecified: Secondary | ICD-10-CM

## 2013-02-21 DIAGNOSIS — F411 Generalized anxiety disorder: Secondary | ICD-10-CM

## 2013-02-21 DIAGNOSIS — G8190 Hemiplegia, unspecified affecting unspecified side: Secondary | ICD-10-CM

## 2013-02-21 LAB — GLUCOSE, CAPILLARY
Glucose-Capillary: 139 mg/dL — ABNORMAL HIGH (ref 70–99)
Glucose-Capillary: 105 mg/dL — ABNORMAL HIGH (ref 70–99)

## 2013-02-21 MED ORDER — RIVAROXABAN 20 MG PO TABS
20.0000 mg | ORAL_TABLET | Freq: Every day | ORAL | Status: DC
  Administered 2013-02-22 – 2013-03-19 (×26): 20 mg via ORAL
  Filled 2013-02-21 (×29): qty 1

## 2013-02-21 MED ORDER — ONDANSETRON HCL 4 MG/2ML IJ SOLN: 4.0000 mg | Freq: Four times a day (QID) | INTRAMUSCULAR | Status: DC | PRN

## 2013-02-21 MED ORDER — MUPIROCIN 2 % EX OINT
1.0000 "application " | TOPICAL_OINTMENT | Freq: Two times a day (BID) | CUTANEOUS | Status: AC
  Administered 2013-02-21 – 2013-02-26 (×10): 1 via NASAL
  Filled 2013-02-21: qty 22

## 2013-02-21 MED ORDER — ZOLPIDEM TARTRATE 5 MG PO TABS
5.0000 mg | ORAL_TABLET | Freq: Every day | ORAL | Status: DC
  Administered 2013-02-21 – 2013-02-22 (×2): 5 mg via ORAL
  Filled 2013-02-21 (×2): qty 1

## 2013-02-21 MED ORDER — ONDANSETRON HCL 4 MG PO TABS: 4.0000 mg | ORAL_TABLET | Freq: Four times a day (QID) | ORAL | Status: DC | PRN

## 2013-02-21 MED ORDER — PREDNISONE 20 MG PO TABS
20.0000 mg | ORAL_TABLET | Freq: Every day | ORAL | Status: DC
  Administered 2013-02-22 – 2013-03-20 (×27): 20 mg via ORAL
  Filled 2013-02-21 (×32): qty 1

## 2013-02-21 MED ORDER — SORBITOL 70 % SOLN
30.0000 mL | Freq: Every day | Status: DC | PRN
  Administered 2013-02-25 – 2013-02-27 (×3): 30 mL via ORAL
  Filled 2013-02-21 (×3): qty 30

## 2013-02-21 MED ORDER — ACETAMINOPHEN 325 MG PO TABS
325.0000 mg | ORAL_TABLET | ORAL | Status: DC | PRN
  Administered 2013-02-22 – 2013-03-18 (×12): 650 mg via ORAL
  Filled 2013-02-21 (×13): qty 2

## 2013-02-21 MED ORDER — ALUM & MAG HYDROXIDE-SIMETH 200-200-20 MG/5ML PO SUSP
30.0000 mL | Freq: Four times a day (QID) | ORAL | Status: DC | PRN
  Administered 2013-02-21: 30 mL via ORAL
  Filled 2013-02-21: qty 30

## 2013-02-21 NOTE — Discharge Summary (Signed)
Stroke Discharge Summary  Patient ID: [name]   MRN: [id number]      DOB: 05/30/1976  Sims of Admission: 02/15/2013 Sims of Discharge: 02/21/2013  Attending Physician:  Darcella Cheshire, MD, Stroke MD  Consulting Physician(s):    pulmonary/intensive care - physical medicine and rehabilitation physician  Patient's PCP:  Egbert Garibaldi, NP  Discharge Diagnoses:  Active Problems:   Postprocedural respiratory failure   Essential hypertension, malignant   Sinus tachycardia BMI  Body mass index is 22.3 kg/(m^2).  Past Medical History  Diagnosis Sims  . PVD (peripheral vascular disease)   . Benign tumor of back   . Swelling, lymph nodes 2006    intermittant, benign  . DVT (deep venous thrombosis)   . Stroke     last week here   Past Surgical History  Procedure Laterality Sims  . Ivc filter placement    . Breast surgery      bil breast implants    Medications to be continued on Rehab . feeding supplement  1 Container Oral BID BM  . insulin aspart  0-15 Units Subcutaneous Q4H  . predniSONE  20 mg Oral Daily  . rivaroxaban  20 mg Oral Q supper    LABORATORY STUDIES CBC    Component Value Sims/Time   WBC 15.8* 02/19/2013 0505   RBC 3.39* 02/19/2013 0505   HGB 8.3* 02/19/2013 0505   HCT 26.3* 02/19/2013 0505   PLT 314 02/19/2013 0505   MCV 77.6* 02/19/2013 0505   MCH 24.5* 02/19/2013 0505   MCHC 31.6 02/19/2013 0505   RDW 18.1* 02/19/2013 0505   LYMPHSABS 2.7 02/16/2013 0500   MONOABS 0.6 02/16/2013 0500   EOSABS 0.0 02/16/2013 0500   BASOSABS 0.0 02/16/2013 0500   CMP    Component Value Sims/Time   NA 141 02/19/2013 0505   K 3.3* 02/19/2013 0505   CL 106 02/19/2013 0505   CO2 25 02/19/2013 0505   GLUCOSE 81 02/19/2013 0505   BUN 9 02/19/2013 0505   CREATININE 0.72 02/19/2013 0505   CALCIUM 9.0 02/19/2013 0505   PROT 7.6 02/15/2013 1943   ALBUMIN 3.2* 02/15/2013 1943   AST 18 02/15/2013 1943   ALT 8 02/15/2013 1943   ALKPHOS 80 02/15/2013 1943   BILITOT 0.2*  02/15/2013 1943   GFRNONAA >90 02/19/2013 0505   GFRAA >90 02/19/2013 0505   COAGS Lab Results  Component Value Sims   INR 1.14 02/20/2013   INR 1.14 02/15/2013   INR 3.65* 12/11/2012   Lipid Panel    Component Value Sims/Time   CHOL 151 02/16/2013 0500   TRIG 166* 02/16/2013 0500   HDL 60 02/16/2013 0500   CHOLHDL 2.5 02/16/2013 0500   VLDL 33 02/16/2013 0500   LDLCALC 58 02/16/2013 0500   HgbA1C  Lab Results  Component Value Sims   HGBA1C 6.0* 02/16/2013   Cardiac Panel (last 3 results) No results found for this basename: CKTOTAL, CKMB, TROPONINI, RELINDX,  in the last 72 hours Urinalysis    Component Value Sims/Time   COLORURINE YELLOW 02/18/2013 1536   APPEARANCEUR CLEAR 02/18/2013 1536   LABSPEC 1.017 02/18/2013 1536   PHURINE 6.0 02/18/2013 1536   GLUCOSEU NEGATIVE 02/18/2013 1536   HGBUR MODERATE* 02/18/2013 1536   BILIRUBINUR NEGATIVE 02/18/2013 1536   KETONESUR 40* 02/18/2013 1536   PROTEINUR NEGATIVE 02/18/2013 1536   UROBILINOGEN 1.0 02/18/2013 1536   NITRITE NEGATIVE 02/18/2013 1536   LEUKOCYTESUR NEGATIVE 02/18/2013 1536   Urine Drug  Screen  No results found for this basename: labopia, cocainscrnur, labbenz, amphetmu, thcu, labbarb    Alcohol Level No results found for this basename: eth    SIGNIFICANT DIAGNOSTIC STUDIES CT scan of the head performed 02/21/2013 CT scan of the pelvis performed 02/19/2013 Portable chest x-ray performed 02/18/2013 Portable chest x-ray performed 02/17/2013 MRI of the brain performed 02/16/2013 Portable chest x-ray 02/16/2013 Portable abdominal x-ray 02/16/2013 CT of the head 02/16/2013 Cerebral angiogram 02/16/2013  History of Present Illness  [name] is an 37 y.o. female with a history of a hypercoagulable state with Takayasu's arteritis, a stroke in January 2014 and deep vein thrombosis, who presented with complaint of headache for several days and apparent new onset of left facial and extremity weakness. Time of onset is unclear.  Deficits were noted early on the afternoon of 02/15/2013 when the mother went over to check on her because of apparent confusion described by neighbors. Patient had been on anticoagulation with Coumadin and has a history of less than ideal compliance. She states that she had been taking Coumadin. However, INR on the day of admission was 1.14. No speech changes were noted by the patient nor patient's mother. CT scan of the head showed an area of acute ischemia involving the right MCA territory, the majority of which appear to be penumbra and potentially reversible. 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 Filling defect involving the innominate artery was noted and thought to likely represent thrombus. The innominate artery was patent along with right common carotid artery. There was also occlusion of the right cavernous carotid and right M1 segment indicative embolic phenomena. NIH stroke scale was 8. Patient was not a TPA candidate secondary to delay in arrival as well as stroke within the past 3 months. She was admitted to the neuro ICU for further evaluation and treatment. She was later transferred to 4 N.  Hospital Course   The patient was admitted to [facility] on 02/16/2013 with complaints of headache and left-sided weakness as well as altered mental status. She has a history of lupus /Takayasu's arteritis, and had been on Coumadin for a previous CVA in January of 2014 as well as a DVT in January of 2014. Her INR was 1.14 on admission. This was felt to be secondary to poor compliance. An MRI of the brain showed an acute infarct in right middle cerebral artery territory. She did not receive TPA secondary to late presentation. On 02/16/2013 the patient underwent a right internal carotid artery thrombectomy performed by Dr. Corliss Skains. The patient was sedated and placed on a ventilator. She was eventually extubated on 02/17/2013. She continued  to show improvement. Decision was made to treat her with Xarelto instead of Coumadin. She was evaluated by the inpatient rehabilitation physicians and felt to be an excellent candidate for admission.  Today the patient was noted to be somewhat lethargic and a stat CT scan was performed which showed evolutionary changes from her right middle cerebral artery territory infarct with increased right cerebral sulcal effacement/edema and early leftward midline shift of 4 mm. This was discussed with doctor Nona Dell who felt that the patient could still be transferred to the rehabilitation unit.  Patient has resultant left hemiparesis. Physical therapy, occupational therapy and speech therapy evaluated patient. All agreed inpatient rehab is needed. Patient's family is supportive and can provide care at discharge. CIR bed is available today and patient will be transferred there.  Discharge Exam  Blood pressure 129/73, pulse 92, temperature 98.8 F (37.1 C), temperature source Oral, resp. rate 19, height 5\' 8"  (1.727 m), weight 66.5 kg (146 lb 9.7 oz), last menstrual period 02/15/2013, SpO2 99.00%.   Physical Exam  Afebrile. Head is nontraumatic. Neck is supple without bruit. Cardiac exam no murmur or gallop. Lungs are clear to auscultation. Distal pulses are well felt.bilateral groin arterial sheaths present.  Neurological Exam quiet speaks little.Eyes are closed Right gaze deviation. Doll's eye movements absent to the left. Pupils 4 mm equal reactive. Corneal reflexes are present. Has weak cough and gag. Left lower facial weakness. Tongue is midline. She has purposeful and localizing movements on the right side to sternal rub and noxious stimuli. No left upper extremity movement to pain. Partial left lower extremity withdrawal to painful stimuli. Left plantar is upgoing. Right is downgoing.   Discharge Diet  Dysphagia thin liquids  Discharge Plan  Disposition:  Transfer to [facility] for  ongoing PT, OT and ST  Xarelto for secondary stroke prevention.  Ongoing risk factor control by Primary Care Physician.  Risk factor recommendations: Reinforce compliance with medications .  Follow-up Millsaps, Joelene Millin, NP in 1 month.  Follow-up with Dr. Delia Heady in 2 months.  Greater than 30 minutes were spent preparing discharge.  Signed Delton See PA-C [facility] Pager [phone] 02/21/2013, 5:46 PM  I have personally examined this patient, reviewed pertinent data and developed the plan of care. I agree with above.

## 2013-02-21 NOTE — Progress Notes (Signed)
Late Entry Pt was very drowsy with initial shift assessment, has delayed response to question with left sided weakness and left facial droop.  At noon pt continue to remain drowsy and difficulty keeping awake even though assisted oob into chair in the room.Pt denied any c/o , follows command appropriately. PA on call for neuro made aware of pt status,and rder for CT of head. Pt sent down for head CT. Rn will continue to monitor.

## 2013-02-21 NOTE — Progress Notes (Signed)
Physical Therapy Treatment Patient Details Name: [name] MRN: [id number] DOB: [birth year] Today's Date: 02/21/2013 Time: 7829-5621 PT Time Calculation (min): 40 min  PT Assessment / Plan / Recommendation Comments on Treatment Session  Pt is a 37 yo female s/p R MCA and L PCA infarcts presenting with L UE/LE flaccidity, cognitive deficts, flat affect, and limited mobility. Pt a great canidate for CIR to achieve maximal functional recovery to decrease burden of care at home.    Follow Up Recommendations  CIR     Does the patient have the potential to tolerate intense rehabilitation     Barriers to Discharge        Equipment Recommendations  None recommended by PT    Recommendations for Other Services Rehab consult  Frequency Min 4X/week   Plan Discharge plan remains appropriate;Frequency remains appropriate    Precautions / Restrictions Precautions Precautions: Fall Restrictions Weight Bearing Restrictions: No   Pertinent Vitals/Pain Pt denies pain    Mobility  Bed Mobility Bed Mobility: Sit to Supine Sit to Supine: 1: +2 Total assist;HOB flat Sit to Supine: Patient Percentage: 30% Details for Bed Mobility Assistance: max diretional v/c's, tactile cues for L UE wbing, assist to control descent of trunk, assist for LE management up into bed Transfers Transfers: Sit to Stand;Stand to Sit;Stand Pivot Transfers Sit to Stand: 1: +2 Total assist;With upper extremity assist;From bed Sit to Stand: Patient Percentage: 40% Stand to Sit: 1: +2 Total assist;With upper extremity assist;To chair/3-in-1 Stand to Sit: Patient Percentage: 40% Stand Pivot Transfers: 1: +2 Total assist Stand Pivot Transfers: Patient Percentage: 30% Details for Transfer Assistance: PT to block L knee/foot for optimal support and kinematics during transfer. use of bad pad to control weight distribution due to pt with strong L lateral lean. max directional v/c's for sequencing, pt with limited  comprehension Ambulation/Gait Ambulation/Gait Assistance: 1: +2 Total assist (3 steps to bed from chair) Ambulation/Gait: Patient Percentage: 30% Ambulation/Gait Assistance Details: pt able to step with R LE, maxA for advancement of L LE Modified Rankin (Stroke Patients Only) Pre-Morbid Rankin Score: No symptoms Modified Rankin: Severe disability    Exercises     PT Diagnosis:    PT Problem List:   PT Treatment Interventions:     PT Goals Acute Rehab PT Goals PT Goal: Sit at Edge Of Bed - Progress: Progressing toward goal PT Goal: Sit to Stand - Progress: Progressing toward goal PT Goal: Stand to Sit - Progress: Progressing toward goal PT Transfer Goal: Bed to Chair/Chair to Bed - Progress: Progressing toward goal PT Goal: Stand - Progress: Progressing toward goal  Visit Information  Last PT Received On: 02/21/13 Assistance Needed: +2    Subjective Data  Subjective: Pt recieved sitting up in chair.   Cognition  Cognition Overall Cognitive Status: Impaired Area of Impairment: Attention;Following commands;Safety/judgement Arousal/Alertness: Awake/alert Behavior During Session: Flat affect Current Attention Level: Sustained Attention - Other Comments: pt easily distracted, limit ability to focus. will ask family questions in middle of trying to perform task with PT Following Commands: Follows one step commands inconsistently;Follows one step commands with increased time Safety/Judgement: Decreased awareness of need for assistance;Decreased safety judgement for tasks assessed;Decreased awareness of safety precautions Cognition - Other Comments: pt with limited carry over, pt with very flat affect. pt appears to have limited comprehension of task and purpose of task asked by PT. ie. pt unclear why pushing up with R hand would help her stand    Balance  Balance Balance Assessed:  Yes Static Sitting Balance Static Sitting - Balance Support: Right upper extremity supported;Feet  supported Static Sitting - Level of Assistance: 3: Mod assist Static Sitting - Comment/# of Minutes: with max encouragement and reminders pt able to maintain midline in sitting in chair for 5 min, otherwise pt with strong L lateral lean requiring mod/maxA to regain midline positioning. Static Standing Balance Static Standing - Balance Support: Bilateral upper extremity supported Static Standing - Level of Assistance: 1: +2 Total assist Static Standing - Comment/# of Minutes: 3 minx2. pt would not use R UE to assist despite max directional v/c's. maxA to achieve midline posture. worked on Production designer, theatre/television/film L /R and weight-bearing through L UE  End of Session PT - End of Session Equipment Utilized During Treatment: Gait belt Activity Tolerance: Patient tolerated treatment well Patient left: in bed (leaving for test) Nurse Communication: Mobility status   GP     Marcene Brawn 02/21/2013, 2:01 PM  Lewis Shock, PT, DPT Pager #: [phone] Office #: [phone]

## 2013-02-21 NOTE — H&P (Signed)
  Progress note    Chief Complaint  Patient presents with  . Cerebrovascular Accident  : HPI: [name] is a 36 y.o. 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 Admitted 02/16/2013 with complaints of headache and left-sided weakness with altered mental status. INR on admission of 1.14 with noted poor compliance on Coumadin. MRI of the brain showed acute infarct right MCA territory without hemorrhage as well as scattered small areas of acute infarct left frontal and parietal lobe. Patient did not receive TPA. Echocardiogram with ejection fraction of 60% without embolism. 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 There was a filling defect involving the innominate artery thought likely to represent thrombus. There was also occlusion of the right cavernous carotid and right M1 segment indicate of embolic phenomenon. Underwent ICA thrombectomy 02/16/2013 per interventional radiology. Patient had been placed on aspirin therapy per neurology services and Coumadin later resumed of which patient adamantly refused and patient was placed thus patient was placed on Xarelto for stroke prevention 02/20/2013 . Currently maintained on a dysphagia 3 thin liquid diet. Physical and occupational therapy evaluations completed 02/18/2013 with recommendations of physical medicine rehabilitation consult to consider inpatient rehabilitation services. Patient was felt to be a good candidate for inpatient rehabilitation services and was admitted for comprehensive rehabilitation program today.   She is sleepy today but awakens easily and follows commands  ROS: denies headache Mom in room with her  Physical Exam: Blood pressure 129/73, pulse 92, temperature 98.8 F (37.1 C), temperature source Oral,  resp. rate 19, height 5\' 8"  (1.727 m), weight 146 lb 9.7 oz (66.5 kg), last menstrual period 02/15/2013, SpO2 99.00%.  NAD Chest - CTA CV- Reg rate Abd- soft NT Ext- no edema Neuro- easily awakens  Post Admission Physician Evaluation: 1. Functional deficits secondary  to thromboembolic right MCA infarct. 2.    Medical Problem List and Plan: 1. Thromboembolic right MCA infarct 2. DVT Prophylaxis/Anticoagulation: IVC filter/ Xarelto. Monitor for any signs of bleeding 3. Neuropsych: This patient is not capable of making decisions on his/her own behalf.  -will need constant positive reinforcement and education  -mother appears quite positive and supportive however 4. History of lupus. Continue prednisone (pt denies dx) 5. MRSA positive nasal swab. Contact precautions 6. History of medical noncompliance. Counseling 7. Microcytic anemia. Latest hemoglobin 8.3. Followup CBC  Ranelle Oyster, MD, [place]  02/21/2013

## 2013-02-21 NOTE — Progress Notes (Signed)
CT scan of the head ordered earlier today secondary to decreased level of responsiveness noted this morning on rounds:  IMPRESSION:  Evolutionary changes of an acute right MCA territory infarct with  increased right cerebral sulcal effacement/edema and early leftward  midline shift.  Discussed results with Dr. Loretha Brasil. The patient is much more responsive and talkative at this time. She feels the Benadryl she received at bedtime was the reason for lethargy. She requests ambien instead of Benadryl as needed for sleep.  Dr. Loretha Brasil feels the patient is still appropriate for rehabilitation admission today.  Delton See PA-C [facility] Pager [phone] 02/21/2013, 4:48 PM  Increased edema R MCA infarct in comparison to prior [facility] that was done on admission. Case reviewed. Slight midline shift from old infarct with edema. Pt more awake now. Transfer to rehab today if possible

## 2013-02-21 NOTE — Progress Notes (Signed)
Stroke Team Progress Note  HISTORY [name] is an 37 y.o. female with a history of Takayasu's arteritis, stroke in January 2014 and deep vein thrombosis, who presented with complaint of headache for several days and apparent new onset of left facial and extremity weakness. Time of onset is unclear. Deficits were noted early on the afternoon of 02/15/2013 when the mother went over to check on her because of apparent confusion described by neighbors. Patient had been on anticoagulation with Coumadin and has a history of less than ideal compliance. She states that she had been taking Coumadin. However, INR on the day of admission was 1.14. No speech changes were noted by the patient nor patient's mother. CT scan of the head showed an area of acute ischemia involving the right MCA territory, the majority of which appear to be penumbra and potentially reversible. 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 Filling defect involving the innominate artery was noted and thought to likely represent thrombus. The innominate artery was patent along with right common carotid artery. There was also occlusion of the right cavernous carotid and right M1 segment indicative embolic phenomena. NIH stroke scale was 8. Patient was not a TPA candidate secondary to delay in arrival as well as stroke within the past 3 months. She was admitted to the neuro ICU for further evaluation and treatment. She was later transferred to 4 N.  SUBJECTIVE There are no family members in the room this morning. The patient is poorly responsive; although, she could be aroused and spoke.  OBJECTIVE Most recent Vital Signs: Filed Vitals:   02/21/13 0200 02/21/13 0418 02/21/13 0500 02/21/13 0600  BP: 162/83   110/75  Pulse: 99   90  Temp: 98.7 F (37.1 C)   98.4 F (36.9 C)  TempSrc:      Resp: 18   16  Height:      Weight:  70.1 kg (154 lb 8.7 oz) 66.5 kg (146 lb 9.7 oz)   SpO2:  100%   100%   CBG (last 3)   Recent Labs  02/20/13 2000 02/20/13 2350 02/21/13 0415  GLUCAP 144* 139* 96   IV Fluid Intake:      MEDICATIONS  . feeding supplement  1 Container Oral BID BM  . insulin aspart  0-15 Units Subcutaneous Q4H  . predniSONE  20 mg Oral Daily  . rivaroxaban  20 mg Oral Q supper   PRN:  acetaminophen, diphenhydrAMINE, fentaNYL  Diet:  Dysphagia 3 thin liquids Activity: OOB DVT Prophylaxis:  SCDs   CLINICALLY SIGNIFICANT STUDIES Basic Metabolic Panel:   Recent Labs Lab 02/18/13 0405 02/19/13 0505  NA 140 141  K 3.9 3.3*  CL 107 106  CO2 21 25  GLUCOSE 109* 81  BUN 7 9  CREATININE 0.65 0.72  CALCIUM 8.9 9.0   Liver Function Tests:   Recent Labs Lab 02/15/13 1943  AST 18  ALT 8  ALKPHOS 80  BILITOT 0.2*  PROT 7.6  ALBUMIN 3.2*   CBC:   Recent Labs Lab 02/15/13 1943  02/16/13 0500  02/18/13 0405 02/19/13 0505  WBC 18.1*  --  12.7*  < > 20.0* 15.8*  NEUTROABS 15.3*  --  9.4*  --   --   --   HGB 12.0  < > 10.6*  < > 7.7* 8.3*  HCT 36.8  < > 31.7*  < > 23.9* 26.3*  MCV 77.6*  --  75.8*  < >  77.3* 77.6*  PLT 284  --  295  < > 276 314  < > = values in this interval not displayed. Coagulation:   Recent Labs Lab 02/15/13 1943 02/20/13 0445  LABPROT 14.4 14.4  INR 1.14 1.14   Cardiac Enzymes:   Recent Labs Lab 02/15/13 1944  TROPONINI <0.30   Urinalysis:   Recent Labs Lab 02/18/13 1536  COLORURINE YELLOW  LABSPEC 1.017  PHURINE 6.0  GLUCOSEU NEGATIVE  HGBUR MODERATE*  BILIRUBINUR NEGATIVE  KETONESUR 40*  PROTEINUR NEGATIVE  UROBILINOGEN 1.0  NITRITE NEGATIVE  LEUKOCYTESUR NEGATIVE   Lipid Panel    Component Value Date/Time   CHOL 151 02/16/2013 0500   TRIG 166* 02/16/2013 0500   HDL 60 02/16/2013 0500   CHOLHDL 2.5 02/16/2013 0500   VLDL 33 02/16/2013 0500   LDLCALC 58 02/16/2013 0500   HgbA1C  Lab Results  Component Value Date   HGBA1C 6.0* 02/16/2013   Urine Drug Screen:   No results found for  this basename: labopia,  cocainscrnur,  labbenz,  amphetmu,  thcu,  labbarb    Alcohol Level: No results found for this basename: ETH,  in the last 168 hours  CT of the brain  02/16/2013  Low attenuation in the right middle cerebral artery distribution compatible with acute / subacute infarction.  No definite hemorrhage.  Probable small area of contrast staining in the insula.  Redundant loop of the nasogastric tube is present in the hypopharynx.  Adjustment is recommended.     Ct Angio Head W/cm &/or Wo Cm 02/15/2013  CT indicates  low density edema in the right insula suggestive of acute infarct.  Occlusion of the right cavernous carotid and right M1 segment suggestive of acute embolus.  There is a filling defect in the innominate artery which may be the source of embolus.  Chronic occlusion left carotid artery with reconstitution at the skull base and good perfusion of left M1 and A1 segments on the left.  Small left vertebral artery which is occluded proximally but reconstitutes in the mid neck.    CT Angio Neck W/cm &/or Wo/cm 02/15/2013 Chronic occlusion of the left carotid artery, left subclavian artery, and left vertebral artery as seen on prior studies due to arteritis.  Filling defect in the innominate artery may be due to fresh thrombus.  The right carotid bifurcation is widely patent.  There is occlusion of the right cavernous carotid and right M1 segment suggesting an embolus.  There is flow and right middle cerebral artery branches.    CT Cerebral Perfusion W/cm 02/15/2013  Acute infarct right MCA territory.  Findings suggest majority of this may be penumbra and could potentially be reversible.    Cerebral Angiogram partial recanalization of occluded distal RT ICA Using trevoprovue thrombectomy. device.  MRI of the brain 02/17/2013  Acute infarct right MCA territory without hemorrhage. Scattered small areas of acute infarct left frontal and parietal lobe.  MRA of the brain  See angio  2D  Echocardiogram  EF 60% with no source of embolus.   Carotid Doppler  Right: No evidence of hemodynamically significant internal carotid artery stenosis. Left: CCA appears occluded. The ECA appears to be feeding the ICA. Vertebral artery appears occluded.   CT Pelvis 02/18/2013 Mild hemorrhage around the puncture site in the right common femoral artery. Tiny amount of retroperitoneal hemorrhage in the pelvis. No large hematoma.   CXR  02/18/2013 no acute disease 02/17/2013 1. Stable support apparatus. 2. Mild right basilar atelectasis.  02/16/2013   1.  Endotracheal tube 45 mm from the carina.  Good position. 2.  Nasogastric tube proximal side port at the gastroesophageal junction.  Consider advancing the NG tube without 3 cm to prevent gastroesophageal reflux. 3.  No active cardiopulmonary disease.   EKG  normal sinus rhythm.   Therapy Recommendations CIR  Physical Exam   Afebrile. Head is nontraumatic. Neck is supple without bruit.  Cardiac exam no murmur or gallop. Lungs are clear to auscultation. Distal pulses are well felt.bilateral groin arterial sheaths present.  Neurological Exam quiet speaks little.Eyes are closed Right gaze deviation. Doll's eye movements absent to the left. Pupils 4 mm equal reactive. Corneal reflexes are present. Has weak cough and gag. Left lower facial weakness. Tongue is midline. She has purposeful and localizing movements on the right side to sternal rub and noxious stimuli. No left upper extremity movement to pain. Partial left lower extremity withdrawal to painful stimuli. Left plantar is upgoing. Right is downgoing.  ASSESSMENT [name] is a 37 y.o. female presenting with confusion,  headache for several days and new onset left facial and extremity weakness. Initial imaging confirms a right MCA territory infarct in the setting of Takayasu's's arteritis and hypercoagulability from lupus anticoagulant. MRI clarified left frontal and parietal lobe embolic  infarcts. Infarct felt to be due to with occlusion of the right cavernous carotid and right M1 segment associated with lupus anticoagulant. On warfarin prior to admission; subtherapeutic INR on arrival 1.14 Now on warfarin that she is refusing to take for secondary stroke prevention. Patient with resultant left hemiparesis. Work up completed.   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 Urine cx neg. CXR neg. ? Reaction to steroids (prescribed at home PTA, likely non complaint as she was not taking her warfarin as well)  Microcytic anemia, improved to Hgb 8.3  Small right retroperitoneal hemorrhage  Hypertension, resolved  MRSA positive nasal swab, on isolation  LDL 58 - not on a statin.  HgbA1c 6.0  Hospital day # 6  TREATMENT/PLAN  Continue Xarelto 20 mg for secondary stroke prevention as patient refused to take warfarin. She is willing to try another agent.  Transfer to inpatient rehabilitation today.   Delton See PA-C [facility] Pager [phone] 02/21/2013, 7:50 AM  Addendum  The patient's nurse later noted that the patient continues to be poorly responsive. This was discussed with Dr. Loretha Brasil and a decision was made to obtain a stat CT of the head. If the CT is negative for any acute findings the patient could still be transferred to the rehabilitation unit today.  Delton See PA-C [facility] Pager [phone] 02/21/2013, 12:57  PM  CTH reviewed minimal midline shift from existing stroke due to edema.  Transfer to Rehab [facility]   I have personally obtained a history, examined the patient, evaluated imaging results, and formulated the assessment and plan of care. I agree with the above.

## 2013-02-21 NOTE — H&P (Signed)
Physical Medicine and Rehabilitation Admission H&P  Chief Complaint   Patient presents with   .  Cerebrovascular Accident   :  HPI: [name] is a 37 y.o. 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 Admitted 02/16/2013 with complaints of headache and left-sided weakness with altered mental status. INR on admission of 1.14 with noted poor compliance on Coumadin. MRI of the brain showed acute infarct right MCA territory without hemorrhage as well as scattered small areas of acute infarct left frontal and parietal lobe. Patient did not receive TPA. Echocardiogram with ejection fraction of 60% without embolism. 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 There was a filling defect involving the innominate artery thought likely to represent thrombus. There was also occlusion of the right cavernous carotid and right M1 segment indicate of embolic phenomenon. Underwent ICA thrombectomy 02/16/2013 per interventional radiology. Patient had been placed on aspirin therapy per neurology services and Coumadin later resumed of which patient adamantly refused and patient was placed thus patient was placed on Xarelto for stroke prevention 02/20/2013 . Currently maintained on a dysphagia 3 thin liquid diet. Physical and occupational therapy evaluations completed 02/18/2013 with recommendations of physical medicine rehabilitation consult to consider inpatient rehabilitation services. Patient was felt to be a good candidate for inpatient rehabilitation services and was admitted for comprehensive rehabilitation program today.  Review of Systems  Neurological. Headaches/dizziness/weakness  Unable to perform ROS  Past Medical History   Diagnosis  Date   .  PVD (peripheral vascular disease)    .  Benign  tumor of back    .  Swelling, lymph nodes  2006     intermittant, benign   .  DVT (deep venous thrombosis)    .  Stroke      last week here    Past Surgical History   Procedure  Laterality  Date   .  Ivc filter placement     .  Breast surgery       bil breast implants    Family History   Problem  Relation  Age of Onset   .  Cancer - Other  Father    .  Coronary artery disease  Mother    .  Stroke  Mother    .  Hypertension  Mother    .  Hypertension  Sister     Social History: reports that she has never smoked. She has never used smokeless tobacco. She reports that she drinks about 3.0 ounces of alcohol per week. She reports that she does not use illicit drugs.  Allergies: No Known Allergies  Medications Prior to Admission   Medication  Sig  Dispense  Refill   .  ALPRAZolam (XANAX) 1 MG tablet  Take 1 tablet (1 mg total) by mouth at bedtime as needed for anxiety.  20 tablet  0   .  predniSONE (DELTASONE) 20 MG tablet  Take 20 mg by mouth daily.     Marland Kitchen  warfarin (COUMADIN) 7.5 MG tablet  Take 3.75 mg by mouth daily.     Marland Kitchen  zolpidem (AMBIEN) 10 MG tablet  Take 1 tablet (10 mg total) by mouth at bedtime as needed for sleep.  20 tablet  0    Home:  Home Living  Additional Comments: pt only responding to some questions.  pt indicates she is currently enrolled in school online and has an 3yr old son who is enrolled at [employer] Unclear if he lives with pt or if anyone lives with pt.  Functional History:  Prior Function  Driving: No  Vocation: Student  Comments: pt indicates she didn't need A with anything PTA.  Functional Status:  Mobility:  Bed Mobility  Bed Mobility: Sitting - Scoot to Edge of Bed;Supine to Sit  Supine to Sit: 1: +2 Total assist;HOB elevated;With rails  Supine to Sit: Patient Percentage: 30%  Sitting - Scoot to Edge of Bed: 1: +2 Total assist  Sitting - Scoot to Edge of Bed: Patient Percentage: 0%  Transfers  Transfers: Sit to Stand;Stand to  [employer] Transfers  Sit to Stand: 1: +2 Total assist;From bed  Sit to Stand: Patient Percentage: 30%  Stand to Sit: 1: +2 Total assist;With armrests;To chair/3-in-1  Stand to Sit: Patient Percentage: 30%  Stand Pivot Transfers: 1: +2 Total assist  Stand Pivot Transfers: Patient Percentage: 30%  Ambulation/Gait  Ambulation/Gait Assistance: Not tested (comment)  Stairs: No  Wheelchair Mobility  Wheelchair Mobility: No  ADL:  ADL  Eating/Feeding: Minimal assistance (cues to initiate and sustain task)  Where Assessed - Eating/Feeding: Wheelchair  Grooming: Moderate assistance;Wash/dry face  Where Assessed - Grooming: Supported sitting (ignoring Lt side of mouth)  Toilet Transfer: +2 Total assistance  Equipment Used: Gait belt;Wheelchair  Transfers/Ambulation Related to ADLs: pt completed stand pivot to the right to w/c for transfer to new room. pt with no AROM noted on Lt side  ADL Comments: Pt sitting on EOB with Rt posterior lean with (A) to remain sitting. pt with right gaze deviation and neck rotation to the right. pt scanning to midline and jumping back to the right. pt looking to the left to locate therapist standing on the left. Pt with incr response when therapist standing on Rt. pt needed directive cues at time during session to progress patient. pt with incr verbalizations with prolonged sitting  Cognition:  Cognition  Overall Cognitive Status: Impaired  Arousal/Alertness: Awake/alert  Orientation Level: Oriented X4  Attention: Focused;Sustained  Focused Attention: Impaired  Focused Attention Impairment: Verbal basic;Functional basic  Sustained Attention: Impaired  Sustained Attention Impairment: Verbal basic;Functional basic  Memory: Impaired  Memory Impairment: Decreased recall of new information;Decreased short term memory  Decreased Short Term Memory: Verbal complex  Awareness: Impaired  Awareness Impairment: Intellectual impairment (primarily of cognitive related  deficit)  Problem Solving: Impaired  Problem Solving Impairment: Functional basic  Executive Function: Initiating;Self Monitoring;Self Correcting;Decision Making  Decision Making: Impaired  Decision Making Impairment: Functional basic  Initiating: Impaired  Initiating Impairment: Functional basic  Self Monitoring: Impaired  Self Monitoring Impairment: Functional basic  Self Correcting: Impaired  Self Correcting Impairment: Functional basic  Behaviors: Impulsive (periods of decreased initiation as well as impulsivity)  Safety/Judgment: Impaired  Cognition  Overall Cognitive Status: Impaired  Area of Impairment: Attention;Following commands;Safety/judgement  Arousal/Alertness: Awake/alert  Behavior During Session: Flat affect  Current Attention Level: Sustained  Following Commands: Follows one step commands with increased time  Safety/Judgement: Decreased awareness of need for assistance;Decreased safety judgement for tasks assessed;Decreased awareness of safety precautions  Cognition - Other Comments: Difficult to assess cognition as pt only occasionally answering questions. At times pt seems cognitively intact, but other times pt seems to have decreased cognition.  Physical Exam:  Blood pressure 121/73, pulse 65, temperature 98.2 F (36.8 C), temperature source Axillary, resp. rate 18, height 5\' 8"  (1.727  m), weight 70.1 kg (154 lb 8.7 oz), last menstrual period 02/15/2013, SpO2 100.00%.  HENT: oral mucosa generally pink and moist.  Head: Normocephalic.  Eyes:  Pupils reactive to light  Neck: Neck supple. No thyromegaly present.  Cardiovascular: Normal rate and regular rhythm. No murmur or gallop  Pulmonary/Chest: Effort normal and breath sounds normal. No respiratory distress. No wheezes or rales  Abdominal: Soft. Bowel sounds are normal. She exhibits no distension.  Neurological: alert, flat  Patient needed Max verbal cues for initiation. She would not follow commands  consistently. Appeared disinterested. POOR insight and awareness. Left inattention. Still no volitional movement in left arm or leg. Does sense pain on left but has difficulty discerning fine touch. No sensory deficits over face. Trace tone in left leg. dtr's 2++, a beat or two of clonus left foot.  Skin: Skin is warm and dry.  Psychiatric:  Flat  Results for orders placed during the hospital encounter of 02/15/13 (from the past 48 hour(s))   GLUCOSE, CAPILLARY Status: None    Collection Time    02/18/13 11:38 AM   Result  Value  Range    Glucose-Capillary  94  70 - 99 mg/dL   URINALYSIS, ROUTINE W REFLEX MICROSCOPIC Status: Abnormal    Collection Time    02/18/13 3:36 PM   Result  Value  Range    Color, Urine  YELLOW  YELLOW    APPearance  CLEAR  CLEAR    Specific Gravity, Urine  1.017  1.005 - 1.030    pH  6.0  5.0 - 8.0    Glucose, UA  NEGATIVE  NEGATIVE mg/dL    Hgb urine dipstick  MODERATE (*)  NEGATIVE    Bilirubin Urine  NEGATIVE  NEGATIVE    Ketones, ur  40 (*)  NEGATIVE mg/dL    Protein, ur  NEGATIVE  NEGATIVE mg/dL    Urobilinogen, UA  1.0  0.0 - 1.0 mg/dL    Nitrite  NEGATIVE  NEGATIVE    Leukocytes, UA  NEGATIVE  NEGATIVE   URINE MICROSCOPIC-ADD ON Status: Abnormal    Collection Time    02/18/13 3:36 PM   Result  Value  Range    Squamous Epithelial / LPF  RARE  RARE    WBC, UA  0-2  <3 WBC/hpf    RBC / HPF  11-20  <3 RBC/hpf    Bacteria, UA  RARE  RARE    Casts  HYALINE CASTS (*)  NEGATIVE    Urine-Other  MUCOUS PRESENT    GLUCOSE, CAPILLARY Status: Abnormal    Collection Time    02/18/13 4:38 PM   Result  Value  Range    Glucose-Capillary  155 (*)  70 - 99 mg/dL   GLUCOSE, CAPILLARY Status: Abnormal    Collection Time    02/18/13 7:59 PM   Result  Value  Range    Glucose-Capillary  125 (*)  70 - 99 mg/dL   GLUCOSE, CAPILLARY Status: Abnormal    Collection Time    02/19/13 12:40 AM   Result  Value  Range    Glucose-Capillary  107 (*)  70 - 99 mg/dL    GLUCOSE, CAPILLARY Status: None    Collection Time    02/19/13 4:09 AM   Result  Value  Range    Glucose-Capillary  92  70 - 99 mg/dL   BASIC METABOLIC PANEL Status: Abnormal    Collection Time    02/19/13 5:05 AM   Result  Value  Range    Sodium  141  135 - 145 mEq/L    Potassium  3.3 (*)  3.5 - 5.1 mEq/L    Chloride  106  96 - 112 mEq/L    CO2  25  19 - 32 mEq/L    Glucose, Bld  81  70 - 99 mg/dL    BUN  9  6 - 23 mg/dL    Creatinine, Ser  4.09  0.50 - 1.10 mg/dL    Calcium  9.0  8.4 - 10.5 mg/dL    GFR calc non Af Amer  >90  >90 mL/min    GFR calc Af Amer  >90  >90 mL/min    Comment:      The eGFR has been calculated     using the CKD EPI equation.     This calculation has not been     validated in all clinical     situations.     eGFR's persistently     <90 mL/min signify     possible Chronic Kidney Disease.   CBC Status: Abnormal    Collection Time    02/19/13 5:05 AM   Result  Value  Range    WBC  15.8 (*)  4.0 - 10.5 K/uL    RBC  3.39 (*)  3.87 - 5.11 MIL/uL    Hemoglobin  8.3 (*)  12.0 - 15.0 g/dL    HCT  81.1 (*)  91.4 - 46.0 %    MCV  77.6 (*)  78.0 - 100.0 fL    MCH  24.5 (*)  26.0 - 34.0 pg    MCHC  31.6  30.0 - 36.0 g/dL    RDW  78.2 (*)  95.6 - 15.5 %    Platelets  314  150 - 400 K/uL   GLUCOSE, CAPILLARY Status: None    Collection Time    02/19/13 8:34 AM   Result  Value  Range    Glucose-Capillary  84  70 - 99 mg/dL   GLUCOSE, CAPILLARY Status: None    Collection Time    02/19/13 11:20 AM   Result  Value  Range    Glucose-Capillary  94  70 - 99 mg/dL   GLUCOSE, CAPILLARY Status: Abnormal    Collection Time    02/19/13 4:13 PM   Result  Value  Range    Glucose-Capillary  176 (*)  70 - 99 mg/dL   GLUCOSE, CAPILLARY Status: Abnormal    Collection Time    02/19/13 8:22 PM   Result  Value  Range    Glucose-Capillary  128 (*)  70 - 99 mg/dL   GLUCOSE, CAPILLARY Status: Abnormal    Collection Time    02/20/13 12:09 AM   Result  Value  Range     Glucose-Capillary  133 (*)  70 - 99 mg/dL   GLUCOSE, CAPILLARY Status: None    Collection Time    02/20/13 4:12 AM   Result  Value  Range    Glucose-Capillary  91  70 - 99 mg/dL   PROTIME-INR Status: None    Collection Time    02/20/13 4:45 AM   Result  Value  Range    Prothrombin Time  14.4  11.6 - 15.2 seconds    INR  1.14  0.00 - 1.49   GLUCOSE, CAPILLARY Status: None    Collection Time    02/20/13 8:05 AM   Result  Value  Range  Glucose-Capillary  91  70 - 99 mg/dL    Ct Pelvis Wo Contrast  02/19/2013 *RADIOLOGY REPORT* Clinical Data: Recent catheterization. Retroperitoneal hematoma. CT PELVIS WITHOUT CONTRAST Technique: Multidetector CT imaging of the pelvis was performed following the standard protocol without intravenous contrast. Comparison: None. Findings: There is a small amount of hemorrhage around the right common femoral artery and neurovascular bundle. Tiny amount of retroperitoneal hemorrhage is present in the pelvis without cranial extension along the iliopsoas. There is respiratory motion artifact present on the examination. Small amount of free fluid is present in the anatomic pelvis. Retroverted, retroflexed uterus. Benign appearing cystic lesion in the left adnexa for which no further evaluation is required. Air is present within the urinary bladder, likely iatrogenic. Bones appear within normal limits. Compared to prior plain films, the catheters have been removed. IMPRESSION: Mild hemorrhage around the puncture site in the right common femoral artery. Tiny amount of retroperitoneal hemorrhage in the pelvis. No large hematoma. Original Report Authenticated By: Andreas Newport, M.D.  Dg Chest Sims 1 View  02/18/2013 *RADIOLOGY REPORT* Clinical Data: Right side chest pain. PORTABLE CHEST - 1 VIEW Comparison: Plain film chest 02/18/2013 AT chest 11/03/2012. Findings: Bilateral breast implants are noted. Lungs are clear. Heart size is normal. No pneumothorax or pleural  effusion. IMPRESSION: No acute disease. Original Report Authenticated By: Holley Dexter, M.D.   Post Admission Physician Evaluation:  1. Functional deficits secondary to thromboembolic right MCA infarct. 2. Patient is admitted to receive collaborative, interdisciplinary care between the physiatrist, rehab nursing staff, and therapy team. 3. Patient's level of medical complexity and substantial therapy needs in context of that medical necessity cannot be provided at a lesser intensity of care such as a SNF. 4. Patient has experienced substantial functional loss from his/her baseline which was documented above under the "Functional History" and "Functional Status" headings. Judging by the patient's diagnosis, physical exam, and functional history, the patient has potential for functional progress which will result in measurable gains while on inpatient rehab. These gains will be of substantial and practical use upon discharge in facilitating mobility and self-care at the household level. 5. Physiatrist will provide 24 hour management of medical needs as well as oversight of the therapy plan/treatment and provide guidance as appropriate regarding the interaction of the two. 6. 24 hour rehab nursing will assist with bladder management, bowel management, safety, skin/wound care, disease management, medication administration, pain management and patient education and help integrate therapy concepts, techniques,education, etc. 7. PT will assess and treat for/with: Lower extremity strength, range of motion, stamina, balance, functional mobility, safety, adaptive techniques and equipment, NMR, cognitive perceptual awareness, visual perceptual awareness ,education. Goals are: minimal to moderate assist. 8. OT will assess and treat for/with: ADL's, functional mobility, safety, upper extremity strength, adaptive techniques and equipment, NMR, cognitive perceptual awareness, education. Goals are: min to mod  assist. 9. SLP will assess and treat for/with: swallowing, cognition, . Goals are: supervision ?+. 10. Case Management and Social Worker will assess and treat for psychological issues and discharge planning. 11. Team conference will be held weekly to assess progress toward goals and to determine barriers to discharge. 12. Patient will receive at least 3 hours of therapy per day at least 5 days per week. 13. ELOS: 3-4 weeks Prognosis: good Medical Problem List and Plan:  1. Thromboembolic right MCA infarct  2. DVT Prophylaxis/Anticoagulation: IVC filter/ Xarelto. Monitor for any signs of bleeding  3. Neuropsych: This patient is not capable of making decisions on his/her own  behalf.  -will need constant positive reinforcement and education  -mother appears quite positive and supportive however  4. History of lupus. Continue prednisone (pt denies dx)  5. MRSA positive nasal swab. Contact precautions  6. History of medical noncompliance. Counseling  7. Microcytic anemia. Latest hemoglobin 8.3. Followup CBC  Ranelle Oyster, MD, [place] [facility]

## 2013-02-22 ENCOUNTER — Inpatient Hospital Stay (HOSPITAL_COMMUNITY): Payer: Medicare Other | Admitting: *Deleted

## 2013-02-22 DIAGNOSIS — I6350 Cerebral infarction due to unspecified occlusion or stenosis of unspecified cerebral artery: Secondary | ICD-10-CM

## 2013-02-22 DIAGNOSIS — I749 Embolism and thrombosis of unspecified artery: Secondary | ICD-10-CM

## 2013-02-22 LAB — GLUCOSE, CAPILLARY
Glucose-Capillary: 86 mg/dL (ref 70–99)
Glucose-Capillary: 99 mg/dL (ref 70–99)

## 2013-02-22 NOTE — Evaluation (Signed)
Occupational Therapy Assessment and Plan  Patient Details  Name: [name] MRN: [id number] Date of Birth: [birth year]  OT Diagnosis: abnormal posture, ataxia, cognitive deficits and hemiplegia affecting non-dominant side Rehab Potential: Rehab Potential: Good ELOS: 3-4 weeks   Today's Date: 02/22/2013 Time:  -   0730- 0855(82min)    Problem List:  Patient Active Problem List  Diagnosis  . ANEMIA, IRON DEFICIENCY  . DEPRESSION  . TAKAYASU'S ARTERITIS  . THROMBOSIS, VENOUS  . CONSTIPATION  . OVARIAN CYST  . FOOT DROP, LEFT  . CVA (cerebral infarction)  . Subtherapeutic international normalized ratio (INR)  . Postprocedural respiratory failure  . Essential hypertension, malignant  . Sinus tachycardia    Past Medical History:  Past Medical History  Diagnosis Date  . PVD (peripheral vascular disease)   . Benign tumor of back   . Swelling, lymph nodes 2006    intermittant, benign  . DVT (deep venous thrombosis)   . Stroke     last week here   Past Surgical History:  Past Surgical History  Procedure Laterality Date  . Ivc filter placement    . Breast surgery      bil breast implants    Assessment & Plan Clinical Impression: [name] is a 37 y.o. 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 Admitted 02/16/2013 with complaints of headache and left-sided weakness with altered mental status. INR on admission of 1.14 with noted poor compliance on Coumadin. MRI of the brain showed acute infarct right MCA territory without hemorrhage as well as scattered small areas of acute infarct left frontal and parietal lobe. Patient did not receive TPA. Echocardiogram with ejection fraction of 60% without embolism. 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 There  was a filling defect involving the innominate artery thought likely to represent thrombus. There was also occlusion of the right cavernous carotid and right M1 segment indicate of embolic phenomenon. Underwent ICA thrombectomy 02/16/2013 per interventional radiology. Patient had been placed on aspirin therapy per neurology services and Coumadin later resumed of which patient adamantly refused and patient was placed thus patient was placed on Xarelto for stroke prevention 02/20/2013 . Currently maintained on a dysphagia 3 thin liquid diet. Physical and occupational therapy evaluations completed 02/18/2013 with recommendations of physical medicine rehabilitation consult to consider inpatient rehabilitation services. Patient was felt to be a good candidate for inpatient rehabilitation services and was admitted for comprehensive rehabilitation program today.    .  Patient transferred to CIR on 02/21/2013 .    Patient currently requires total with basic self-care skills secondary to impaired timing and sequencing, abnormal tone, unbalanced muscle activation and decreased motor planning and decreased initiation, decreased awareness, decreased problem solving, decreased safety awareness, decreased memory and delayed processing.  Prior to hospitalization, patient could complete*BADL with independent .  Patient will benefit from skilled intervention to decrease level of assist with basic self-care skills prior to discharge questionable care giver support.  Anticipate patient will require 24 hour supervision and follow up home health.  OT - End of Session Activity Tolerance: Tolerates 10 - 20 min activity with multiple rests Endurance Deficit: Yes OT Assessment Rehab Potential: Good Barriers to Discharge: None OT Plan OT Intensity: Minimum of 1-2 x/day, 45 to 90 minutes OT Frequency: 5 out of 7 days OT Duration/Estimated  Length of Stay: 3-4 weeks OT Treatment/Interventions: Balance/vestibular  training;Cognitive remediation/compensation;Community reintegration;Discharge planning;DME/adaptive equipment instruction;Functional mobility training;Functional electrical stimulation;Neuromuscular re-education;Patient/family education;Pain management;Self Care/advanced ADL retraining;Splinting/orthotics;Therapeutic Activities;Therapeutic Exercise;UE/LE Strength taining/ROM;UE/LE Coordination activities;Visual/perceptual remediation/compensation;Wheelchair propulsion/positioning   Skilled Therapeutic Intervention Pt. Presents with decrease muscle tone on left side and LUE flaccid.  Exhibits decreased postural control, decreased protective reactions, decreased righting reactions>  Pt.can benefit from skilled OT to facilitate balance, therapeutic activity, neuromuscular re education, transfers, postural control, BADL.  OT Evaluation Precautions/Restrictions  Precautions Precautions: Fall Restrictions Weight Bearing Restrictions: No l      Pain Pain Assessment Pain Assessment: 0-10 Pain Score:   2 Pain Type: Acute pain Pain Location: Head Pain Descriptors: Aching Patients Stated Pain Goal: 2 Pain Intervention(s): Medication (See eMAR) Home Living/Prior Functioning Home Living Lives With: Alone;Son Available Help at Discharge:Questionalble family support after discharge to be able to provide 24 hours/day Type of Home: Apartment Home Access: Stairs to enter [employer] of Steps: 2 at front and none at the back. Home Layout: Two level;1/2 bath on main level Bathroom Shower/Tub: Tub/shower unit Additional Comments: pt only responding to some questions.  pt indicates she is currently enrolled in school online and has an 85yr old son who is enrolled at [employer].  Unclear if he lives with pt or if anyone lives with pt.   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 Prior Function Driving:  No Vocation: Consulting civil engineer ADL ADL Grooming: Dependent Where Assessed-Grooming: Careers adviser Bathing: Maximal assistance Where Assessed-Upper Body Bathing: Shower (tub bench) Lower Body Bathing: Maximal assistance Where Assessed-Lower Body Bathing: Shower Where Assessed-Upper Body Dressing: Other (Comment) Lower Body Dressing: Not assessed Where Assessed-Lower Body Dressing:  (gown only) Toileting: Maximal assistance Where Assessed-Toileting: Bedside Commode Toilet Transfer: Dependent Statistician Method: Surveyor, minerals: Gaffer:  (+2) Walk-In Shower Transfer: Other (comment) (+2) Walk-In Engineer, structural Method: Warden/ranger: Transfer tub bench;Grab bars Vision/Perception  Vision - History Patient Visual Report: Other (comment) (right deviation) Vision - Assessment Vision Assessment: Vision impaired - to be further tested in functional context Perception Perception: Within Functional Limits Praxis Praxis: Intact  Cognition Overall Cognitive Status: Impaired Orientation Level: Oriented X4 Attention: Focused;Sustained Focused Attention: Appears intact Focused Attention Impairment: Verbal basic;Functional basic Sustained Attention: Impaired Sustained Attention Impairment: Verbal basic;Functional basic Memory: Impaired Decreased Short Term Memory: Verbal complex Awareness: Impaired Awareness Impairment: Intellectual impairment Problem Solving: Impaired Problem Solving Impairment: Functional basic Executive Function: Initiating;Self Monitoring;Self Correcting;Decision Making Decision Making: Impaired Decision Making Impairment: Functional basic Initiating: Impaired Initiating Impairment: Functional basic Self Monitoring: Impaired Self Monitoring Impairment: Functional basic Self Correcting: Impaired Self Correcting Impairment: Functional basic Behaviors: Lability Safety/Judgment:  Impaired Comments:  (decreased protective response) Sensation Sensation Light Touch: Impaired by gross assessment Hot/Cold: Appears Intact Coordination Gross Motor Movements are Fluid and Coordinated: No Fine Motor Movements are Fluid and Coordinated: No Coordination and Movement Description:  (LUE flaccid with 1 finger width subluxation) Motor  Motor Motor: Hemiplegia;Abnormal postural alignment and control;Motor impersistence Motor - Skilled Clinical Observations: no protective or righting response Mobility  Bed Mobility Bed Mobility: Sit to Supine Supine to Sit: 2: Max assist;HOB elevated Supine to Sit: Patient Percentage: 10% Supine to Sit Details: Manual facilitation for weight shifting;Manual facilitation for placement;Manual facilitation for weight bearing;Verbal cues for technique;Verbal cues for sequencing;Visual cues/gestures for sequencing Sitting - Scoot to Edge of Bed: 1: +1 Total assist Sitting - Scoot to [place] of  Bed: Patient Percentage: 0% Sitting - Scoot to Edge of Bed Details: Manual facilitation for weight shifting;Manual facilitation for placement;Manual facilitation for weight bearing Sit to Supine: 1: +1 Total assist Sit to Supine: Patient Percentage: 10% Transfers Stand to Sit: 1: +2 Total assist;To bed;To chair/3-in-1;To toilet  Trunk/Postural Assessment  Cervical Assessment Cervical Assessment: Within Functional Limits Thoracic Assessment Thoracic Assessment: Within Functional Limits Lumbar Assessment Lumbar Assessment: Within Functional Limits Postural Control Postural Control: Deficits on evaluation Righting Reactions: poor Protective Responses: leans to left Postural Limitations: max assist to sit midline  Balance Static Sitting Balance Static Sitting - Balance Support: Right upper extremity supported;Feet supported Static Sitting - Level of Assistance: 2: Max assist Static Standing Balance Static Standing - Balance Support: During functional  activity;Right upper extremity supported Static Standing - Level of Assistance: 1: +2 Total assist Extremity/Trunk Assessment RUE Assessment RUE Assessment: Within Functional Limits LUE Assessment LUE Assessment: Exceptions to WFL LUE PROM (degrees) Overall PROM Left Upper Extremity: Deficits LUE Tone LUE Tone: Flaccid LUE Tone Comments: subluxation at shoulder (about thumbs width , superior)  FIM:  FIM - Grooming Grooming: 1: Patient completes 0 of 4 or 1 of 5 steps, or requires 2 helpers FIM - Bathing Bathing Steps Patient Completed: Right upper leg;Left upper leg;Abdomen;Front perineal area;Chest;Left Arm Bathing: 1: Two helpers FIM - Upper Body Dressing/Undressing Upper body dressing/undressing: 0: Wears gown/pajamas-no public clothing FIM - Lower Body Dressing/Undressing Lower body dressing/undressing: 0: Wears Oceanographer FIM - Toileting Toileting steps completed by patient: Performs perineal hygiene FIM - Banker Devices: Bed rails Bed/Chair Transfer: 1: Two helpers FIM - Diplomatic Services operational officer Devices: Psychiatrist Transfers: 1-Two helpers FIM - Secretary/administrator Devices: Grab bars;Tub transfer bench Tub/shower Transfers: 1-Two helpers   Refer to Care Plan for Long Term Goals  Recommendations for other services: Neuropsych  Discharge Criteria: Patient will be discharged from OT if patient refuses treatment 3 consecutive times without medical reason, if treatment goals not met, if there is a change in medical status, if patient makes no progress towards goals or if patient is discharged from hospital.  The above assessment, treatment plan, treatment alternatives and goals were discussed and mutually agreed upon: by patient  Humberto Seals 02/22/2013, 11:44 AM

## 2013-02-22 NOTE — Evaluation (Signed)
Physical Therapy Assessment and Plan  Patient Details  Name: [name] MRN: [id number] Date of Birth: 1976-06-18  PT Diagnosis: Hemiplegia non dominant side ELOS: 3-4 weeks   Today's Date: 02/22/2013 Time: 4098-1191 Time Calculation (min): 75 min  Initial session focused on evaluation of patient's CLOF and abilities and identifying areas of deficits.Patient education on safety and importance of participation in therapy. Patient was sleeping in bed at the beginning of session, difficult to wake up and needs increased amount of cues to archive even minimal participation. Training in rolling in bed R and L, supine to sit with max A. Sitting EOB with max A due to strong L side lean and L side neglect, patient does not even attempt to correct her posture,transfer training bed to w/ c with max A for both stand pivot and scooting, patient does not acknowledge her l side and leans to teh left, no WB through L LE. W/C has been chosen and adjusted for patient's needs, attempted w/c training, patient is able to unlock R side break and pull on wheel to propel, refuses to propel w/c more than once. Patient returned to bed and alarm turned on, all needs within reach.   Problem List:  Patient Active Problem List  Diagnosis  . ANEMIA, IRON DEFICIENCY  . DEPRESSION  . TAKAYASU'S ARTERITIS  . THROMBOSIS, VENOUS  . CONSTIPATION  . OVARIAN CYST  . FOOT DROP, LEFT  . CVA (cerebral infarction)  . Subtherapeutic international normalized ratio (INR)  . Postprocedural respiratory failure  . Essential hypertension, malignant  . Sinus tachycardia    Past Medical History:  Past Medical History  Diagnosis Date  . PVD (peripheral vascular disease)   . Benign tumor of back   . Swelling, lymph nodes 2006    intermittant, benign  . DVT (deep venous thrombosis)   . Stroke     last week here   Past Surgical History:  Past Surgical History  Procedure Laterality Date  . Ivc filter placement    . Breast  surgery      bil breast implants    Assessment & Plan Clinical Impression:[name] is a 37 y.o. 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 Admitted 02/16/2013 with complaints of headache and left-sided weakness with altered mental status. INR on admission of 1.14 with noted poor compliance on Coumadin. MRI of the brain showed acute infarct right MCA territory without hemorrhage as well as scattered small areas of acute infarct left frontal and parietal lobe. Patient did not receive TPA. Echocardiogram with ejection fraction of 60% without embolism. 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 There was a filling defect involving the innominate artery thought likely to represent thrombus. There was also occlusion of the right cavernous carotid and right M1 segment indicate of embolic phenomenon. Underwent ICA thrombectomy 02/16/2013 per interventional radiology. Patient had been placed on aspirin therapy per neurology services and Coumadin later resumed of which patient adamantly refused and patient was placed thus patient was placed on Xarelto for stroke prevention 02/20/2013    Patient currently requires max with mobility secondary to unbalanced muscle activation,strenght deficits and L side neglect.  Prior to hospitalization, patient was independent  with mobility and lived with Alone in a House (Town house) home with one flight of stairs to go upstairs.  Home access is 2 at front and none at the back.Other (comment).  Patient will benefit from skilled PT intervention to maximize safe functional mobility for planned discharge home with intermittent assist.  Anticipate patient will benefit from follow up [facility] at discharge.  PT - End of Session Activity Tolerance: Decreased this  session Endurance Deficit: Yes PT Assessment Rehab Potential: Good Barriers to Discharge: Decreased caregiver support PT Plan PT Intensity: Minimum of 1-2 x/day ,45 to 90 minutes PT Frequency: 5 out of 7 days PT Duration Estimated Length of Stay: 3-4 weeks PT Treatment/Interventions: Ambulation/gait training;Therapeutic Activities;UE/LE Strength taining/ROM;Wheelchair propulsion/positioning;Neuromuscular re-education PT Recommendation Recommendations for Other Services: Neuropsych consult Follow Up Recommendations: Home health PT Patient destination: Home PT Evaluation Precautions/Restrictions Precautions Precautions: Fall Restrictions Weight Bearing Restrictions: No General Chart Reviewed: Yes Family/Caregiver Present: Yes Vital Signs  Pain Pain Assessment Pain Assessment: No/denies pain Pain Score:   2 Home Living/Prior Functioning Home Living Lives With: Alone Available Help at Discharge: Family Type of Home: House (Town house) Home Access: Other (comment) Entrance Stairs-Number of Steps: 2 at front and none at the back. Home Layout: Two level Alternate Level Stairs-Number of Steps: 12 Bathroom Shower/Tub: Tub/shower unit Bathroom Accessibility: No Home Adaptive Equipment: None Additional Comments: pt only responding to some questions.  pt indicates she is currently enrolled in school online and has an 49yr old son who is enrolled at [employer].  Unclear if he lives with pt or if anyone lives with pt.   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, retired Arts development officer) Vision/Perception  Vision - History Baseline Vision: No visual deficits Patient Visual Report: Other (comment) (right deviation) Vision - Assessment Vision Assessment: Vision impaired - to be further tested in functional context Perception Perception: Within Functional  Limits Praxis Praxis: Intact  Cognition Overall Cognitive Status: Appears within functional limits for tasks assessed Arousal/Alertness: Lethargic Orientation Level: Oriented X4 Attention: Selective Focused Attention: Impaired Focused Attention Impairment: Verbal basic Sustained Attention: Impaired Sustained Attention Impairment: Verbal basic;Functional basic Memory: Impaired Decreased Short Term Memory: Verbal complex Awareness: Impaired Awareness Impairment: Intellectual impairment Problem Solving: Impaired Problem Solving Impairment: Functional basic Executive Function: Initiating;Self Monitoring;Self Correcting;Decision Making Decision Making: Impaired Decision Making Impairment: Functional basic Initiating: Impaired Initiating Impairment: Functional basic Self Monitoring: Impaired Self Monitoring Impairment: Functional basic Self Correcting: Impaired Self Correcting Impairment: Functional basic Behaviors: Lability Safety/Judgment: Impaired Comments:  (decreased protective response) Sensation Sensation Light Touch: Impaired by gross assessment Hot/Cold: Appears Intact Coordination Gross Motor Movements are Fluid and Coordinated: Yes Fine Motor Movements are Fluid and Coordinated: No Coordination and Movement Description:  (Patient is not coordinated, strong L side lean , neglect) Motor  Motor Motor: Hemiplegia Motor - Skilled Clinical Observations: no protective or righting response  Mobility Bed Mobility Bed Mobility: Rolling Right;Rolling Left;Right Sidelying to Sit Rolling Right: 2: Max assist Rolling Right Details: Manual facilitation for placement;Manual facilitation for weight shifting Rolling Left: 4: Min assist Rolling Left Details: Tactile cues for initiation Right Sidelying to Sit: 2: Max assist Right Sidelying to Sit Details: Manual facilitation for weight shifting;Verbal cues for technique Supine to Sit: 2: Max assist Supine to Sit: Patient  Percentage: 30% Supine to Sit Details: Manual facilitation for weight shifting;Tactile cues for posture Sitting - Scoot to Edge of Bed: 2: Max assist Sitting - Scoot to [place] of Bed: Patient Percentage: 30% Sitting - Scoot to [place] of Bed Details:  Manual facilitation for weight shifting;Manual facilitation for placement;Manual facilitation for weight bearing Sit to Supine: 2: Max assist Sit to Supine: Patient Percentage: 30% Transfers Sit to Stand: 1: +1 Total assist Stand to Sit: 1: +1 Total assist Stand Pivot Transfers: 2: Max assist Stand Pivot Transfer Details: Manual facilitation for weight shifting;Manual facilitation for placement;Manual facilitation for weight bearing;Verbal cues for technique;Verbal cues for sequencing;Verbal cues for precautions/safety Stand Pivot Transfer Details (indicate cue type and reason):  (Scooting to w/c and stand pivot with max A) Locomotion  Ambulation Ambulation: No Ambulation/Gait Assistance: Not tested (comment) (Patient unable at this time.) Gait Gait: No Stairs / Additional Locomotion Stairs: No Wheelchair Mobility Wheelchair Mobility: Yes Wheelchair Assistance: 2: Max Education officer, museum: Right upper extremity  Trunk/Postural Assessment  Cervical Assessment Cervical Assessment: Within Film/video editor Assessment: Within Functional Limits Lumbar Assessment Lumbar Assessment: Within Functional Limits Postural Control Postural Control: Deficits on evaluation Head Control:  (Not able to maintain midline head position, leans to L ) Trunk Control:  (Strong L side lean) Righting Reactions: poor Protective Responses: leans to left Postural Limitations: max assist to sit midline  Balance Static Sitting Balance Static Sitting - Balance Support: Feet supported;Right upper extremity supported Static Sitting - Level of Assistance: 2: Max assist Static Sitting - Comment/# of Minutes:  (Patient looses sitting  balance after less than 10 seconds ) Static Standing Balance Static Standing - Balance Support: During functional activity;Right upper extremity supported Static Standing - Level of Assistance: 1: +2 Total assist Extremity Assessment  RUE Assessment RUE Assessment: Within Functional Limits LUE Assessment LUE Assessment: Exceptions to WFL LUE PROM (degrees) Overall PROM Left Upper Extremity: Deficits LUE Tone LUE Tone: Flaccid LUE Tone Comments: subluxation at shoulder (about thumbs width , superior)   LLE Assessment LLE Assessment: Exceptions to [facility] LLE Strength LLE Overall Strength: Deficits (L LE totally flaccid, MMT 0/5)  FIM:  FIM - Banker Devices: Bed rails Bed/Chair Transfer: 1: Two helpers FIM - Locomotion: Ambulation Ambulation/Gait Assistance: Not tested (comment) (Patient unable at this time.)   Refer to Care Plan for Long Term Goals  Recommendations for other services: Neuropsych  Discharge Criteria: Patient will be discharged from PT if patient refuses treatment 3 consecutive times without medical reason, if treatment goals not met, if there is a change in medical status, if patient makes no progress towards goals or if patient is discharged from hospital.  The above assessment, treatment plan, treatment alternatives and goals were discussed and mutually agreed upon: by patient  Dorna Mai 02/22/2013, 12:34 PM

## 2013-02-22 NOTE — Progress Notes (Signed)
Patient ID: [name], female   DOB: May 07, 1976, 37 y.o.   MRN: [id number] HPI: [name] is a 37 y.o. 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 Admitted 02/16/2013 with complaints of headache and left-sided weakness with altered mental status. INR on admission of 1.14 with noted poor compliance on Coumadin. MRI of the brain showed acute infarct right MCA territory without hemorrhage as well as scattered small areas of acute infarct left frontal and parietal lobe. Patient did not receive TPA. Echocardiogram with ejection fraction of 60% without embolism. 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 There was a filling defect involving the innominate artery thought likely to represent thrombus. There was also occlusion of the right cavernous carotid and right M1 segment indicate of embolic phenomenon. Underwent ICA thrombectomy 02/16/2013 per interventional radiology. Patient had been placed on aspirin therapy per neurology services and Coumadin later resumed of which patient adamantly refused and patient was placed thus patient was placed on Xarelto for stroke prevention 02/20/2013 . Currently maintained on a dysphagia 3 thin liquid diet. Physical and occupational therapy evaluations completed 02/18/2013 with recommendations of physical medicine rehabilitation consult to consider inpatient rehabilitation services. Patient was felt to be a good candidate for inpatient rehabilitation services and was admitted for comprehensive rehabilitation program today.    Awake today. Answers yes no questions but does not respond to more open-ended questions  ROS: denies headache    Physical Exam:  Blood pressure 129/73, pulse 92, temperature 98.8 F (37.1 C), temperature source Oral, resp.  rate 19, height 5\' 8"  (1.727 m), weight 146 lb 9.7 oz (66.5 kg), last menstrual period 02/15/2013, SpO2 99.00%.  NAD  Chest - CTA  CV- Reg rate  Abd- soft NT  Ext- no edema  Neuro- easily awakens  Post Admission Physician Evaluation:  1. Functional deficits secondary to thromboembolic right MCA infarct. Medical Problem List and Plan:  1. Thromboembolic right MCA infarct  2. DVT Prophylaxis/Anticoagulation: IVC filter/ Xarelto. Monitor for any signs of bleeding  3. Neuropsych: This patient is not capable of making decisions on his/her own behalf.  -will need constant positive reinforcement and education   4. History of lupus. Continue prednisone 5. MRSA positive nasal swab. Contact precautions  6. History of medical noncompliance.  7. Microcytic anemia. Latest hemoglobin 8.3. Followup CBC

## 2013-02-22 NOTE — Progress Notes (Signed)
Patient admitted around 2030 this evening. Mom at the bedside with patient. Explained Rehab routine and expectations. Safety guidelines discussed and patient and mom verbalized understanding. Orientated patient to the room and use of the call light with return demonstration. Patient and mom has no further questions at this time.

## 2013-02-22 NOTE — Plan of Care (Addendum)
Overall Plan of Care [facility]) Patient Details Name: [name] MRN: [id number] DOB: [birth year]  Diagnosis:  Rehabilitation for left hemiparesis related to right MCA infarct from thrombus  Co-morbidities: PVD (peripheral vascular disease)  .  Benign tumor of back  .  Swelling, lymph nodes  2006  intermittant, benign  .  DVT (deep venous thrombosis   Functional Problem List  Patient demonstrates impairments in the following areas: Balance, Cognition, Endurance, Medication Management, Motor, Nutrition, Pain, Safety and Sensory   Basic ADL's: eating, grooming, bathing, dressing, toileting and toilet and tub transfers Advanced ADL's: none  Transfers:  bed mobility, bed to chair, toilet, tub/shower, car and furniture Locomotion:  ambulation, wheelchair mobility and stairs  Additional Impairments:  Functional use of upper extremity, Swallowing, Communication  comprehension and expression, Social Cognition   social interaction, problem solving, memory, attention and awareness and Discharge Disposition  Anticipated Outcomes Item Anticipated Outcome  Eating/Swallowing  Supervision   Basic self-care  Min A  Tolieting  Min A  Bowel/Bladder    Transfers  Supervision bed mobility; min assist transfers  Locomotion  Supervision wheelchair mobility, min assist ambulating short distances  Communication  Min assist   Cognition  Min assist   Pain  Pain level <3  Safety/Judgment  Min A  Other     Therapy Plan: PT Intensity: Minimum of 1-2 x/day ,45 to 90 minutes PT Frequency: 5 out of 7 days PT Duration Estimated Length of Stay: 3-4 weeks OT Intensity: Minimum of 1-2 x/day, 45 to 90 minutes OT Frequency: 5 out of 7 days OT Duration/Estimated Length of Stay: 3-4 weeks SLP Intensity: Minumum of 1-2 x/day, 30 to 90 minutes SLP Frequency: 5 out of 7 days SLP Duration/Estimated Length of Stay: 3 weeks    Team Interventions: Item RN PT OT SLP SW TR Other  Self Care/Advanced  ADL Retraining   x      Neuromuscular Re-Education  x x      Therapeutic Activities  x x x     UE/LE Strength Training/ROM  x x      UE/LE Coordination Activities   x      Visual/Perceptual Remediation/Compensation         DME/Adaptive Equipment Instruction  x x      Therapeutic Exercise  x x      Balance/Vestibular Training  x x      Patient/Family Education x x x x     Cognitive Remediation/Compensation  x x x     Functional Mobility Training  x x      Ambulation/Gait Training  x       Stair Training  x       Wheelchair Propulsion/Positioning  x x      Functional Statistician  x       Insurance account manager    x     Speech/Language Facilitation    x     Bladder Management         Bowel Management         Disease Management/Prevention x        Pain Management x        Medication Management x   x     Skin Care/Wound Management         Splinting/Orthotics  x x      Discharge Planning x x x x     Psychosocial Support x  x x  Team Discharge Planning: Destination: PT-Home ,OT-  home vs NHP , SLP- (TBD) Projected Follow-up: PT-Home health PT, OT-   home health OT, SLP-Home Health SLP;24 hour supervision/assistance Projected Equipment Needs: PT- , OT-  BSC and tub bench, SLP-None recommended by SLP Patient/family involved in discharge planning: PT- Patient;Family member/caregiver,  OT-Patient, SLP-Patient  MD ELOS: 3 weeks Medical Rehab Prognosis:  Good Assessment: 37 year old female recently admitted for brainstem infarct now admitted for right MCA infarct. Patient now requires CIR level PT OT and speech therapy as well as 24 7 rehabilitation RN and M.D. Treatment team will focus on ADLs, cognitive perceptual skills, mobility, coordination, and safety. Goals are to achieve a supervision to min assist levelwith mobility and ADLs    See Team Conference Notes for weekly updates to the plan of  care

## 2013-02-23 ENCOUNTER — Inpatient Hospital Stay (HOSPITAL_COMMUNITY): Payer: Medicare Other | Admitting: Speech Pathology

## 2013-02-23 ENCOUNTER — Inpatient Hospital Stay (HOSPITAL_COMMUNITY): Payer: TRICARE For Life (TFL)

## 2013-02-23 ENCOUNTER — Inpatient Hospital Stay (HOSPITAL_COMMUNITY): Payer: Medicare Other

## 2013-02-23 ENCOUNTER — Inpatient Hospital Stay (HOSPITAL_COMMUNITY): Payer: Medicare Other | Admitting: Occupational Therapy

## 2013-02-23 LAB — COMPREHENSIVE METABOLIC PANEL
BUN: 12 mg/dL (ref 6–23)
Calcium: 8.7 mg/dL (ref 8.4–10.5)
Creatinine, Ser: 0.71 mg/dL (ref 0.50–1.10)
GFR calc Af Amer: >90 mL/min (ref >90)
GFR calc non Af Amer: >90 mL/min (ref >90)
Glucose, Bld: 85 mg/dL (ref 70–99)
Sodium: 141 mEq/L (ref 135–145)
Total Protein: 6.5 g/dL (ref 6.0–8.3)

## 2013-02-23 LAB — CBC WITH DIFFERENTIAL/PLATELET
Eosinophils Absolute: 0.1 10*3/uL (ref 0.0–0.7)
Eosinophils Relative: 1 % (ref 0–5)
HCT: 26.4 % — ABNORMAL LOW (ref 36.0–46.0)
Lymphs Abs: 3.1 10*3/uL (ref 0.7–4.0)
MCH: 25.1 pg — ABNORMAL LOW (ref 26.0–34.0)
MCV: 77 fL — ABNORMAL LOW (ref 78.0–100.0)
Monocytes Absolute: 0.6 10*3/uL (ref 0.1–1.0)
Monocytes Relative: 5 % (ref 3–12)
Platelets: 393 10*3/uL (ref 150–400)
RBC: 3.43 MIL/uL — ABNORMAL LOW (ref 3.87–5.11)

## 2013-02-23 MED ORDER — POTASSIUM CHLORIDE CRYS ER 20 MEQ PO TBCR
20.0000 meq | EXTENDED_RELEASE_TABLET | Freq: Two times a day (BID) | ORAL | Status: AC
  Administered 2013-02-23 – 2013-02-24 (×4): 20 meq via ORAL
  Filled 2013-02-23 (×4): qty 1

## 2013-02-23 MED ORDER — TEMAZEPAM 7.5 MG PO CAPS
7.5000 mg | ORAL_CAPSULE | Freq: Every day | ORAL | Status: DC
  Administered 2013-02-23 – 2013-03-04 (×10): 7.5 mg via ORAL
  Filled 2013-02-23 (×5): qty 1
  Filled 2013-02-23: qty 2
  Filled 2013-02-23 (×7): qty 1

## 2013-02-23 MED ORDER — BOOST / RESOURCE BREEZE PO LIQD
1.0000 | ORAL | Status: DC
  Administered 2013-02-23 – 2013-02-25 (×3): 1 via ORAL

## 2013-02-23 MED ORDER — ADULT MULTIVITAMIN W/MINERALS CH
1.0000 | ORAL_TABLET | Freq: Every day | ORAL | Status: DC
  Administered 2013-02-23 – 2013-03-20 (×26): 1 via ORAL
  Filled 2013-02-23 (×29): qty 1

## 2013-02-23 MED ORDER — TEMAZEPAM 7.5 MG PO CAPS: 7.5000 mg | ORAL_CAPSULE | Freq: Every day | ORAL | Status: DC

## 2013-02-23 MED ORDER — ZOLPIDEM TARTRATE 5 MG PO TABS: 5.0000 mg | ORAL_TABLET | Freq: Every evening | ORAL | Status: DC | PRN

## 2013-02-23 NOTE — Plan of Care (Signed)
Problem: RH BLADDER ELIMINATION Goal: RH STG MANAGE BLADDER WITH ASSISTANCE STG Manage Bladder With MOD I Assistance  Outcome: Progressing Pt completes peri care and is continent of bladder on BSC however cannot stand to pull up pants and cannot pull up pants with one arm

## 2013-02-23 NOTE — Progress Notes (Signed)
Physical Therapy Session Note  Patient Details  Name: [name] MRN: [id number] Date of Birth: May 22, 1976  Today's Date: 02/23/2013 Time: 4132-4401 Time Calculation (min): 41 min  Short Term Goals: Week 1:  PT Short Term Goal 1 (Week 1): Patient will perform bed mobility with min A,including rolling R and L and supine to sit with min A PT Short Term Goal 2 (Week 1): Sitting EOB with min A and decreased LOB to the L. PT Short Term Goal 3 (Week 1): Patient will perform transfers with mod A from bed to w/c .     Skilled Therapeutic Interventions/Progress Updates: Pt in bed; son in room.  Pt sleeping, difficult to arouse.  Eventually pt woke up.  Pt lethargic, minimally participated.  Session focused on participation, bed mobility and transfers.  In supine and standing, pt assisted with donning pants, +2 assist with son and therapist. Supine > sit EOB with mod assist.  Pt sat bedside with min assist and occasional cues for upright and attention to LUE. Sit> stand to pull up pants, using R hand, leaning to R supported by her son.  Pivot to R to w/c with max assist.  Midline orientation in sitting, using mirror for visual feedback.   Pt answered questions about school, work with extra time.  She denied having lupus. Pt reported that she takes naps whenever she wants to at home, since her classes are online.    Therapeutic exercise performed with RLE in sitting, to increase strength for functional mobility: R hip flexion and R knee extension, x 10 each. Therapist attempted to elicit mass LLE extension with pt in sitting, but no active motors detected.  Eventually pt was able to perform L knee extension to push L foot off of footrest to place on floor; she was unable to repeat this.  W/c> bed stand pivot to bed to R, +2 assist using bed rail.  Pt was unable to pivot upon standing.  Sit> supine with mod assist; scooting in supine to [facility] required +2 assist.    Bed alarm activated; all needs left  within pt's reach. Therapy Documentation Precautions:  Precautions Precautions: Fall Precaution Comments: lethargic, leans to L Restrictions Weight Bearing Restrictions: No   Pain: Pain Assessment Pain Assessment: No/denies pain      Other Treatments: Treatments Neuromuscular Facilitation: Left;Lower Extremity;Activity to increase motor control;Forced use  See FIM for current functional status  Therapy/Group: Individual Therapy  Savino Whisenant 02/23/2013, 2:22 PM

## 2013-02-23 NOTE — Evaluation (Signed)
Speech Language Pathology Assessment and Plan  Patient Details  Name: [name] MRN: [id number] Date of Birth: 10/23/1976  SLP Diagnosis: Dysarthria;Cognitive Impairments;Dysphagia  Rehab Potential: Fair ELOS: 3 weeks   Today's Date: 02/23/2013 Time: 5621-3086 Time Calculation (min): 30 min  Problem List:  Patient Active Problem List  Diagnosis  . ANEMIA, IRON DEFICIENCY  . DEPRESSION  . TAKAYASU'S ARTERITIS  . THROMBOSIS, VENOUS  . CONSTIPATION  . OVARIAN CYST  . FOOT DROP, LEFT  . CVA (cerebral infarction)  . Subtherapeutic international normalized ratio (INR)  . Postprocedural respiratory failure  . Essential hypertension, malignant  . Sinus tachycardia  . Embolic infarction   Past Medical History:  Past Medical History  Diagnosis Date  . PVD (peripheral vascular disease)   . Benign tumor of back   . Swelling, lymph nodes 2006    intermittant, benign  . DVT (deep venous thrombosis)   . Stroke     last week here   Past Surgical History:  Past Surgical History  Procedure Laterality Date  . Ivc filter placement    . Breast surgery      bil breast implants    Assessment / Plan / Recommendation Clinical Impression  [name] is a 37 y.o. 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 Admitted 02/16/2013 with complaints of headache and left-sided weakness with altered mental status. INR on admission of 1.14 with noted poor compliance on Coumadin. MRI of the brain showed acute infarct right MCA territory without hemorrhage as well as scattered small areas of acute infarct left frontal and parietal lobe. Patient did not receive TPA. Echocardiogram with ejection fraction of 60% without embolism. 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 There was  a filling defect involving the innominate artery thought likely to represent thrombus. There was also occlusion of the right cavernous carotid and right M1 segment indicate of embolic phenomenon. Underwent ICA thrombectomy 02/16/2013 per interventional radiology. Patient had been placed on aspirin therapy per neurology services and Coumadin later resumed of which patient adamantly refused and patient was thus placed on Xarelto for stroke prevention 02/20/2013 . Currently maintained on a dysphagia 3 and thin liquid diet. Rehabilitation consult to consider inpatient rehabilitation services; patient was felt to be a good candidate and was admitted 02/21/13.    Cognitive-Linguistic and Bedside Swallow Evaluations completed 02/23/13.  Patient demonstrated mild oral weakness resulting in oral dysphagia and dysarthria.  Patient also demonstrates moderately severe cognitive deficits characterized by decreased sustained attention, poor intellectual awareness, initiation, flat affect, recall of new information as well as problem solving.  Patient also demonstrated unwillingness to participate at times refusing to fully complete all bedside trails and at times closing eyes and turning head away from clinician.  Overall, patient required increased wait time and max assist verbal, visual cues to order lunch with menu.  As a result, it is recommended that this patient receive skilled SLP services to address above deficits, maximize functional independence and reduce burden of care prior to discharge.     SLP Assessment  Patient will need skilled Speech Lanaguage Pathology Services during CIR admission    Recommendations  Diet Recommendations: Dysphagia 3 (Mechanical Soft);Thin liquid Liquid Administration via: Cup Medication Administration: Whole meds with liquid Supervision: Patient able to self feed;Intermittent supervision to cue for compensatory strategies  Compensations: Slow rate;Small sips/bites;Check for  pocketing;Check for anterior loss Postural Changes and/or Swallow Maneuvers: Seated upright 90 degrees Oral Care Recommendations: Oral care BID Recommendations for Other Services: Neuropsych consult Patient destination:  (TBD) Follow up Recommendations: Home Health SLP;24 hour supervision/assistance Equipment Recommended: None recommended by SLP    SLP Frequency 5 out of 7 days   SLP Treatment/Interventions Cognitive remediation/compensation;Cueing hierarchy;Dysphagia/aspiration precaution training;Environmental controls;Functional tasks;Internal/external aids;Oral motor exercises;Patient/family education;Speech/Language facilitation;Therapeutic Activities    Pain Pain Assessment Pain Assessment: No/denies pain Prior Functioning Cognitive/Linguistic Baseline: Within functional limits  Short Term Goals: Week 1: SLP Short Term Goal 1 (Week 1): Patient will initiate verbal communication with communication partner in response to questions with mod assist clinician cues.  SLP Short Term Goal 2 (Week 1): Patient will attend to left visual field during a functional ADL with moderate clinician cues.  SLP Short Term Goal 3 (Week 1): Patient will sustain attention to basic functional ADL for 15 minutes with moderate clinician cues.  SLP Short Term Goal 4 (Week 1): Patient will demonstrate intellectual awareness of deficits post CVA by naming 3 changes with moderate questions cues.  SLP Short Term Goal 5 (Week 1): Patient will recall and retuen use of call bell to request assistance with mod assist question cues.   See FIM for current functional status Refer to Care Plan for Long Term Goals  Recommendations for other services: Neuropsych  Discharge Criteria: Patient will be discharged from SLP if patient refuses treatment 3 consecutive times without medical reason, if treatment goals not met, if there is a change in medical status, if patient makes no progress towards goals or if patient is  discharged from hospital.  The above assessment, treatment plan, treatment alternatives and goals were discussed and mutually agreed upon: by patient  Fae Pippin, M.A., CCC-SLP [phone]  [name] 02/23/2013, 9:30 AM

## 2013-02-23 NOTE — Plan of Care (Signed)
Problem: RH BOWEL ELIMINATION Goal: RH STG MANAGE BOWEL W/MEDICATION W/ASSISTANCE STG Manage Bowel with Medication with MOD I Assistance.  Outcome: Not Progressing Not progressing; no BM sine 3/26 and refusing laxatives

## 2013-02-23 NOTE — Progress Notes (Signed)
Patient ID: [name], female   DOB: July 17, 1976, 37 y.o.   MRN: [id number] Subjective/Complaints: y.o. 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 Admitted 02/16/2013 with complaints of headache and left-sided weakness with altered mental status. INR on admission of 1.14 with noted poor compliance on Coumadin. MRI of the brain showed acute infarct right MCA territory without hemorrhage as well as scattered small areas of acute infarct left frontal and parietal lobe. Patient did not receive TPA. Echocardiogram with ejection fraction of 60% without embolism. 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 There was a filling defect involving the innominate artery thought likely to represent thrombus. There was also occlusion of the right cavernous carotid and right M1 segment indicate of embolic phenomenon. Underwent ICA thrombectomy 02/16/2013 per interventional radiology. Patient had been placed on aspirin therapy per neurology services and Coumadin later resumed of which patient adamantly refused and patient was placed thus patient was placed on Xarelto for stroke prevention 02/20/2013  Review of Systems  Neurological: Positive for dizziness and focal weakness.  All other systems reviewed and are negative.    Objective: Vital Signs: Blood pressure 116/64, pulse 71, temperature 97.3 F (36.3 C), temperature source Oral, resp. rate 18, weight 67.7 kg (149 lb 4 oz), last menstrual period 02/15/2013, SpO2 98.00%. Ct Head Wo Contrast  02/21/2013  *RADIOLOGY REPORT*  Clinical Data: Acute right MCA infarction  CT HEAD WITHOUT CONTRAST  Technique:  Contiguous axial images were obtained from the base of the skull through the vertex without contrast.  Comparison: 12/05/2012, 02/16/2013   Findings: Evolutionary changes of an acute right MCA territory infarction involving the right insula, basal ganglia, and frontal lobe.  Diffuse edema in the MCA territory infarct with sulcal effacement and slight leftward midline shift measuring 4 mm, image 14.  No acute intracranial hemorrhage or extra-axial fluid collection.  No hydrocephalus.  Basilar cisterns are patent.  No cerebellar abnormality.  Mastoids and sinuses remain clear.  IMPRESSION: Evolutionary changes of an acute right MCA territory infarct with increased right cerebral sulcal effacement/edema and early leftward midline shift.   Original Report Authenticated By: Judie Petit. Miles Costain, M.D.    Results for orders placed during the hospital encounter of 02/21/13 (from the past 72 hour(s))  GLUCOSE, CAPILLARY     Status: Abnormal   Collection Time    02/21/13  5:28 PM      Result Value Range   Glucose-Capillary 198 (*) 70 - 99 mg/dL  GLUCOSE, CAPILLARY     Status: None   Collection Time    02/22/13 11:56 AM      Result Value Range   Glucose-Capillary 86  70 - 99 mg/dL  GLUCOSE, CAPILLARY     Status: None   Collection Time    02/22/13  8:53 PM      Result Value Range   Glucose-Capillary 99  70 - 99 mg/dL  CBC WITH DIFFERENTIAL     Status: Abnormal   Collection Time    02/23/13  6:55 AM      Result Value Range   WBC 12.6 (*) 4.0 - 10.5 K/uL   RBC 3.43 (*) 3.87 - 5.11 MIL/uL   Hemoglobin 8.6 (*) 12.0 - 15.0 g/dL   HCT 95.6 (*) 21.3 - 08.6 %   MCV 77.0 (*) 78.0 - 100.0 fL  MCH 25.1 (*) 26.0 - 34.0 pg   MCHC 32.6  30.0 - 36.0 g/dL   RDW 16.1 (*) 09.6 - 04.5 %   Platelets 393  150 - 400 K/uL   Neutrophils Relative 70  43 - 77 %   Neutro Abs 8.7 (*) 1.7 - 7.7 K/uL   Lymphocytes Relative 24  12 - 46 %   Lymphs Abs 3.1  0.7 - 4.0 K/uL   Monocytes Relative 5  3 - 12 %   Monocytes Absolute 0.6  0.1 - 1.0 K/uL   Eosinophils Relative 1  0 - 5 %   Eosinophils Absolute 0.1  0.0 - 0.7 K/uL   Basophils Relative 0  0 - 1 %   Basophils  Absolute 0.0  0.0 - 0.1 K/uL  COMPREHENSIVE METABOLIC PANEL     Status: Abnormal   Collection Time    02/23/13  6:55 AM      Result Value Range   Sodium 141  135 - 145 mEq/L   Potassium 3.3 (*) 3.5 - 5.1 mEq/L   Chloride 106  96 - 112 mEq/L   CO2 26  19 - 32 mEq/L   Glucose, Bld 85  70 - 99 mg/dL   BUN 12  6 - 23 mg/dL   Creatinine, Ser 4.09  0.50 - 1.10 mg/dL   Calcium 8.7  8.4 - 81.1 mg/dL   Total Protein 6.5  6.0 - 8.3 g/dL   Albumin 2.7 (*) 3.5 - 5.2 g/dL   AST 13  0 - 37 U/L   ALT 7  0 - 35 U/L   Alkaline Phosphatase 51  39 - 117 U/L   Total Bilirubin 0.3  0.3 - 1.2 mg/dL   GFR calc non Af Amer >90  >90 mL/min   GFR calc Af Amer >90  >90 mL/min   Comment:            The eGFR has been calculated     using the CKD EPI equation.     This calculation has not been     validated in all clinical     situations.     eGFR's persistently     <90 mL/min signify     possible Chronic Kidney Disease.     HEENT: normal Heart regular rate and rhythm no rubs murmurs or exercise Abdomen positive bowel sounds soft nontender palpation Extremities no clubbing cyanosis or edema Musculoskeletal no shoulder pain with range of motion left side. Motor strength 5/5 in the right deltoid, biceps, triceps, grip, hip flexor, knee extensors, ankle dorsiflexor plantar flexor 2 minus/5 in the left deltoid, biceps, triceps, grip 3 minus in the hip flexor knee extensor ankle dorsiflexor and flexor left Sensation is able to identify light touch in the left upper and left lower extremity as well as on the right side. Evidence of ataxia finger nose to finger testing on the right side Unable to do finger-nose-finger testing on the left side secondary to weakness   Assessment/Plan: 1. Functional deficits secondary to R MCA infarct which require 3+ hours per day of interdisciplinary therapy in a comprehensive inpatient rehab setting. Physiatrist is providing close team supervision and 24 hour management of  active medical problems listed below. Physiatrist and rehab team continue to assess barriers to discharge/monitor patient progress toward functional and medical goals. FIM: FIM - Bathing Bathing Steps Patient Completed: Right upper leg;Left upper leg;Abdomen;Front perineal area;Chest;Left Arm Bathing: 1: Two helpers  FIM - Upper Body Dressing/Undressing Upper body  dressing/undressing: 0: Wears Oceanographer FIM - Lower Body Dressing/Undressing Lower body dressing/undressing: 0: Wears Oceanographer  FIM - Toileting Toileting steps completed by patient: Performs perineal hygiene  FIM - Diplomatic Services operational officer Devices: Psychiatrist Transfers: 1-Two helpers  FIM - Banker Devices: Bed rails Bed/Chair Transfer: 2: Supine > Sit: Max A (lifting assist/Pt. 25-49%);2: Bed > Chair or W/C: Max A (lift and lower assist);2: Chair or W/C > Bed: Max A (lift and lower assist)  FIM - Locomotion: Wheelchair Locomotion: Wheelchair: 1: Two helpers FIM - Locomotion: Ambulation Ambulation/Gait Assistance: Not tested (comment) Locomotion: Ambulation: 0: Activity did not occur  Comprehension Comprehension Mode: Auditory Comprehension: 2-Understands basic 25 - 49% of the time/requires cueing 51 - 75% of the time  Expression Expression Mode: Verbal Expression: 2-Expresses basic 25 - 49% of the time/requires cueing 50 - 75% of the time. Uses single words/gestures.  Social Interaction Social Interaction: 2-Interacts appropriately 25 - 49% of time - Needs frequent redirection.  Problem Solving Problem Solving: 2-Solves basic 25 - 49% of the time - needs direction more than half the time to initiate, plan or complete simple activities  Memory Memory: 2-Recognizes or recalls 25 - 49% of the time/requires cueing 51 - 75% of the time  Medical Problem List and Plan:  1. Thromboembolic right MCA  infarct  2. DVT Prophylaxis/Anticoagulation: IVC filter/ Xarelto. Monitor for any signs of bleeding  3. Neuropsych: This patient is not capable of making decisions on his/her own behalf.  -will need constant positive reinforcement and education  -mother appears quite positive and supportive however  4. History of lupus. Continue prednisone (pt denies dx)  5. MRSA positive nasal swab. Contact precautions  6. History of medical noncompliance. Counseling  7. Microcytic anemia. Latest hemoglobin 8.3. Followup CBC   LOS (Days) 2 A FACE TO FACE EVALUATION WAS PERFORMED  Keirston Saephanh E 02/23/2013, 9:19 AM

## 2013-02-23 NOTE — Plan of Care (Signed)
Problem: RH KNOWLEDGE DEFICIT Goal: RH STG INCREASE KNOWLEDGE OF DYSPHAGIA/FLUID INTAKE Outcome: Progressing Reviewed dysphagia diet and restrictions and precautions

## 2013-02-23 NOTE — Progress Notes (Signed)
Occupational Therapy Session Note  Patient Details  Name: [name] MRN: [id number] Date of Birth: [birth year]  Today's Date: 02/23/2013 Time: 1000-1110 Time Calculation (min): 70 min  Short Term Goals: Week 1:  OT Short Term Goal 1 (Week 1): Pt.  will maintain midline control for 5 minutes during grooming activity OT Short Term Goal 2 (Week 1): Pt. will perform grooming with mod assist OT Short Term Goal 3 (Week 1): Pt. will perform UB bathing with minimal assist and max with portural control OT Short Term Goal 4 (Week 1): Pt. will bathe LB with mod assist and max assist with sitting balance OT Short Term Goal 5 (Week 1): Pt. will dress UB with mod assist  Skilled Therapeutic Interventions/Progress Updates:    Pt seen this am to facilitate functional mobility and self care skills.  The NT assisted this clinician this am as it was reported that pt is a total assist x 2.  Pt did not want to get out of bed but we explained to patient that she needed to get OOB to change her linen. Pt worked on rolling to [employer]. She was able to wash her front perineal area.  She was able to move from sidelying into sit to left with min assist and transferred to w/c with min assist x1.  Continues to have poor trunk control and flaccid LUE. Provided pt with quick release belt for trunk support and left lap tray to support arm. Pt engaged in grooming and UB bathing with max cues.   LUE neuro reed with tapping, ROM with towel sliding exercises.  Pt did ask what she can do for her LUE. Instructed patient in self ROM, massage. Pt seems to have intact light touch and temperature.  She was also demonstrated awareness of her LUE positioning about 40% of the time.  She was able to answer some basic questions with full answers, other times it was difficult to engage patient.  She would often pause for long periods of time during a conversation. Pt left sitting up in w/c with belt, tray, and call light within  reach.  Therapy Documentation Precautions:  Precautions Precautions: Fall Restrictions Weight Bearing Restrictions: No   Pain: Pain Assessment Pain Assessment: 0-10 Pain Score:   4 Pain Type: Acute pain Pain Location: Head Pain Orientation: Right Pain Descriptors: Aching Pain Onset: With Activity Patients Stated Pain Goal: 0 Pain Intervention(s): Rest ADL: See FIM for current functional status  Therapy/Group: Individual Therapy  SAGUIER,JULIA 02/23/2013, 11:20 AM

## 2013-02-23 NOTE — Progress Notes (Signed)
INITIAL NUTRITION ASSESSMENT  DOCUMENTATION CODES Per approved criteria  -Not Applicable   INTERVENTION: 1. Add Resource Breeze po daily, each supplement provides 250 kcal and 9 grams of protein. 2. MVI daily 3. RD to continue to follow nutrition care plan  NUTRITION DIAGNOSIS: Increased nutrient needs related to acute illness/injury as evidenced by estimated needs.   Goal: Intake to meet >90% of estimated nutrition needs.  Monitor:  weight trends, lab trends, I/O's, PO intake, supplement tolerance  Reason for Assessment: Health History  37 y.o. 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 Admitted 3/24 with complaints of headache and left-sided weakness with AMS. MRI of the brain showed acute infarct right MCA territory without hemorrhage as well as scattered small areas of acute infarct left frontal and parietal lobe. Underwent ICA thrombectomy 03/24.   Currently maintained on a Dysphagia 3 thin liquid diet. Pt followed by RD staff during acute hospitalization. Ordered for [employer]; per mom at bedside, pt enjoys this supplement. Pt is eating well, however would benefit from additional oral nutrition supplement to help meet kcal and protein goals.  Height: Ht Readings from Last 1 Encounters:  02/16/13 5\' 8"  (1.727 m)    Weight: Wt Readings from Last 1 Encounters:  02/22/13 149 lb 4 oz (67.7 kg)    Ideal Body Weight: 140 lb  % Ideal Body Weight: 106%  Wt Readings from Last 10 Encounters:  02/22/13 149 lb 4 oz (67.7 kg)  02/21/13 146 lb 9.7 oz (66.5 kg)  12/07/12 143 lb 11.2 oz (65.182 kg)    Usual Body Weight: 145 lb  % Usual Body Weight: 103%  BMI:  Body mass index is 22.7 kg/(m^2). WNL  Estimated Nutritional Needs: Kcal: 1650 - 1900 kcal Protein: 90 - 100 grams Fluid: at least 1.5  liters daily  Skin: intact  Diet Order: Dysphagia 3; thin  EDUCATION NEEDS: -No education needs identified at this time   Intake/Output Summary (Last 24 hours) at 02/23/13 1447 Last data filed at 02/23/13 1244  Gross per 24 hour  Intake    240 ml  Output      0 ml  Net    240 ml    Last BM: 3/26  Labs:   Recent Labs Lab 02/18/13 0405 02/19/13 0505 02/23/13 0655  NA 140 141 141  K 3.9 3.3* 3.3*  CL 107 106 106  CO2 21 25 26   BUN 7 9 12   CREATININE 0.65 0.72 0.71  CALCIUM 8.9 9.0 8.7  GLUCOSE 109* 81 85    CBG (last 3)   Recent Labs  02/21/13 1728 02/22/13 1156 02/22/13 2053  GLUCAP 198* 86 99    Scheduled Meds: . mupirocin ointment  1 application Nasal BID  . potassium chloride  20 mEq Oral BID  . predniSONE  20 mg Oral Daily  . rivaroxaban  20 mg Oral Q supper  . zolpidem  5 mg Oral Q2000    Continuous Infusions:   Past Medical History  Diagnosis Date  . PVD (peripheral vascular disease)   . Benign tumor of back   . Swelling, lymph nodes 2006    intermittant, benign  . DVT (deep venous thrombosis)   . Stroke     last week here    Past Surgical History  Procedure Laterality Date  . Ivc filter placement    .  Breast surgery      bil breast implants    Jarold Motto MS, RD, LDN Pager: [phone] After-hours pager: [phone]

## 2013-02-23 NOTE — Progress Notes (Signed)
Occuapational Therapy Note  Patient Details  Name: [name] MRN: [id number] Date of Birth: 07-08-1976 Today's Date: 02/23/2013  Time: 7:30-8:30am ( .) Pt seen for 1:1 OT session to address BADL re-training and bed mobility. Upon arrival, pt very difficult to rouse, laying in bed with covers pulled up. Difficulty engaging pt once eyes opened. Called for assistance to get pt EOB and possibly into wheelchair to complete bathing, with no help arriving. Decided to work on bed mobility and wash up with pt in supine. Pt did wash LE's with verbal cueing to initiate and follow through. Max A to wash L lower leg and foot. When asked to wash her UB, pt did not respond and held cloth for a few minutes before lying it down. When asked if she needed help or wanted privacy, pt did not respond. Told pt she needed to participate with no response. Pt stated in beginning of session she was extremely tired and that she did not sleep at all last night. Also asked if she could have a different sleeping medication, which was passed along to nursing. No pain reported during session.   Yamaira Spinner Hessie Diener 02/23/2013, 12:41 PM

## 2013-02-23 NOTE — Progress Notes (Signed)
Patient information reviewed and entered into eRehab system by Nielle Duford, RN, CRRN, PPS Coordinator.  Information including medical coding and functional independence measure will be reviewed and updated through discharge.     Per nursing patient was given "Data Collection Information Summary for Patients in Inpatient Rehabilitation Facilities with attached "Privacy Act Statement-Health Care Records" upon admission.  

## 2013-02-23 NOTE — Progress Notes (Signed)
Social Work Assessment and Plan Social Work Assessment and Plan  Patient Details  Name: [name] MRN: [id number] Date of Birth: [birth year]  Today's Date: 02/23/2013  Problem List:  Patient Active Problem List  Diagnosis  . ANEMIA, IRON DEFICIENCY  . DEPRESSION  . TAKAYASU'S ARTERITIS  . THROMBOSIS, VENOUS  . CONSTIPATION  . OVARIAN CYST  . FOOT DROP, LEFT  . CVA (cerebral infarction)  . Subtherapeutic international normalized ratio (INR)  . Postprocedural respiratory failure  . Essential hypertension, malignant  . Sinus tachycardia  . Embolic infarction   Past Medical History:  Past Medical History  Diagnosis Date  . PVD (peripheral vascular disease)   . Benign tumor of back   . Swelling, lymph nodes 2006    intermittant, benign  . DVT (deep venous thrombosis)   . Stroke     last week here   Past Surgical History:  Past Surgical History  Procedure Laterality Date  . Ivc filter placement    . Breast surgery      bil breast implants   Social History:  reports that she has never smoked. She has never used smokeless tobacco. She reports that she drinks about 3.0 ounces of alcohol per week. She reports that she does not use illicit drugs.  Family / Support Systems Marital Status: Separated Patient Roles: Parent Children: Son lives in [place] and goes to school Other Supports: [place]-Mom  [phone]-home  [phone] Anticipated Caregiver: Mom has had a CVA but has recovered, she works from home Ability/Limitations of Caregiver: Mom can only do so much physical care Caregiver Availability: 24/7 Family Dynamics: Pt reports a good relationship with her Mom, but hesitates when answering  Social History Preferred language: English Religion: Baptist Cultural Background: No issues Education: [employer], currently in school for PHD Read: Yes Write: Yes Employment Status: Disabled Fish farm manager Issues: No issues Guardian/Conservator:  None-according to MD pt is not capable of mkaing her own decisions.  WIll rely upon son since he is next of kin and mom   Abuse/Neglect Physical Abuse: Denies Verbal Abuse: Denies Sexual Abuse: Denies Exploitation of patient/patient's resources: Denies Self-Neglect: Denies  Emotional Status Pt's affect, behavior adn adjustment status: Pt talks about being tired and not sleeping here so how can you do therapies when you feel this way.  She has always been independent and able to take care of herself.  She wants to do well here, she states, but her actions state she doesn't want to participate. Recent Psychosocial Issues: Other medical issues Pyschiatric History: Unsure no hisotry in chart, but pt will not answer this question.  Could not do depression screen due to pt refused-would not answer.  Will have Neuro-psych get involved to assist team with pt. Substance Abuse History: No issues  Patient / Family Perceptions, Expectations & Goals Pt/Family understanding of illness & functional limitations: When discussing stroke she does not talk and just stares at you.  Unsure how much she understands and what she does.  She does not want to talk because she sounds different than before.   Premorbid pt/family roles/activities: Consulting civil engineer, Mother, Daughter, Sister, etc Anticipated changes in roles/activities/participation: resume Pt/family expectations/goals: Pt states: " I want to sleep and then I may do therapies."  Son here but he did not answer any questions, bringing pt her cell phone charger.  Community Resources [employer]: None Premorbid Home Care/DME Agencies: None Transportation available at discharge: Family Resource referrals recommended: Support group (specify);Neuropsychology (CVA Support group)  Discharge Planning Living Arrangements: Alone Support Systems: Children;Parent Type of Residence: Private residence Insurance Resources: [employer] Financial Resources: [employer] Financial  Screen Referred: No Living Expenses: Rent Money Management: Patient Do you have any problems obtaining your medications?: No Home Management: Self Patient/Family Preliminary Plans: Go to Mom's house and have Mom assist here.  Will need to discuss plan with Mom and see what she can do for pt and what her limitations are.  Pt not very receptive of services and wants it seems to be left alone. Social Work Anticipated Follow Up Needs: HH/OP;Support Group;SNF  Clinical Impression Very guarded pt who is not open to answer any questions or at times participate in therapies.  She seems to want to do well, but unsure if she is willing to work with [place].  Will include her Mom and See what her thoughts are.  Will get Neuro-psych involved to assist the team in getting pt to participate and maybe come up with a behavior plan, if necessary.  Lucy Chris 02/23/2013, 1:21 PM

## 2013-02-24 ENCOUNTER — Inpatient Hospital Stay (HOSPITAL_COMMUNITY): Payer: Medicare Other | Admitting: Occupational Therapy

## 2013-02-24 ENCOUNTER — Inpatient Hospital Stay (HOSPITAL_COMMUNITY): Payer: Medicare Other

## 2013-02-24 ENCOUNTER — Inpatient Hospital Stay (HOSPITAL_COMMUNITY): Payer: Medicare Other | Admitting: Speech Pathology

## 2013-02-24 ENCOUNTER — Inpatient Hospital Stay (HOSPITAL_COMMUNITY): Payer: TRICARE For Life (TFL)

## 2013-02-24 DIAGNOSIS — I69998 Other sequelae following unspecified cerebrovascular disease: Secondary | ICD-10-CM

## 2013-02-24 DIAGNOSIS — R209 Unspecified disturbances of skin sensation: Secondary | ICD-10-CM

## 2013-02-24 DIAGNOSIS — G8110 Spastic hemiplegia affecting unspecified side: Secondary | ICD-10-CM

## 2013-02-24 DIAGNOSIS — I6340 Cerebral infarction due to embolism of unspecified cerebral artery: Secondary | ICD-10-CM

## 2013-02-24 NOTE — Progress Notes (Signed)
Speech Language Pathology Daily Session Note  Patient Details  Name: [name] MRN: [id number] Date of Birth: 02/28/1976  Today's Date: 02/24/2013 Time: 1130-1145 Time Calculation (min): 15 min  Short Term Goals: Week 1: SLP Short Term Goal 1 (Week 1): Patient will initiate verbal communication with communication partner in response to questions with mod assist clinician cues.  SLP Short Term Goal 2 (Week 1): Patient will attend to left visual field during a functional ADL with moderate clinician cues.  SLP Short Term Goal 3 (Week 1): Patient will sustain attention to basic functional ADL for 15 minutes with moderate clinician cues.  SLP Short Term Goal 4 (Week 1): Patient will demonstrate intellectual awareness of deficits post CVA by naming 3 changes with moderate questions cues.  SLP Short Term Goal 5 (Week 1): Patient will recall and retuen use of call bell to request assistance with mod assist question cues.   Skilled Therapeutic Interventions: Treatment focus on dysphagia goals. Pt presented with a flat affect and limited social interaction with clinicians and other group members. Pt required encouragement to participate in group therapy and intermittent requested to go back to her room. Pt demonstrated limited PO intake of Dys. 3 textures and thin liquids via straw. Pt without overt s/s of aspiration and self-monitored left buccal pocketing but required Min verbal cues to self-monitor and correct left anterior loss.    FIM:  Comprehension Comprehension Mode: Auditory Comprehension: 3-Understands basic 50 - 74% of the time/requires cueing 25 - 50%  of the time Expression Expression Mode: Verbal Expression: 3-Expresses basic 50 - 74% of the time/requires cueing 25 - 50% of the time. Needs to repeat parts of sentences. Social Interaction Social Interaction: 2-Interacts appropriately 25 - 49% of time - Needs frequent redirection. Problem Solving Problem Solving: 2-Solves basic 25 -  49% of the time - needs direction more than half the time to initiate, plan or complete simple activities Memory Memory: 2-Recognizes or recalls 25 - 49% of the time/requires cueing 51 - 75% of the time FIM - Eating Eating Activity: 5: Supervision/cues  Pain No/Denies Pain  Therapy/Group: Group Therapy  Geniyah Eischeid 02/24/2013, 4:12 PM

## 2013-02-24 NOTE — Progress Notes (Signed)
Speech Language Pathology Daily Session Note  Patient Details  Name: [name] MRN: [id number] Date of Birth: 05/14/1976  Today's Date: 02/24/2013 Time: 4098-1191 Time Calculation (min): 42 min  Short Term Goals: Week 1: SLP Short Term Goal 1 (Week 1): Patient will initiate verbal communication with communication partner in response to questions with mod assist clinician cues.  SLP Short Term Goal 2 (Week 1): Patient will attend to left visual field during a functional ADL with moderate clinician cues.  SLP Short Term Goal 3 (Week 1): Patient will sustain attention to basic functional ADL for 15 minutes with moderate clinician cues.  SLP Short Term Goal 4 (Week 1): Patient will demonstrate intellectual awareness of deficits post CVA by naming 3 changes with moderate questions cues.  SLP Short Term Goal 5 (Week 1): Patient will recall and retuen use of call bell to request assistance with mod assist question cues.   Skilled Therapeutic Interventions: Skilled treatment session focused on cognitive linguistic goals. Pt required moderate verbal cues to sustain attention to basic task for 3 minutes. Max verbal cues to attend to conversation partner in left visual field with eye contact. Pt required  question cues in 100% of opportunities to intitiate interaction, express basic wants/needs though with initial cue pt participated in complex verbal tasks. SLP provided visual and verbal cues with modeling for rising and falling intonation with positive and negative phrases per pts request "I want to sound normal when I talk. How can I put more emotion into my speech?"  Pt return demonstrated in 100% of trials. Pt is motivated to participate, attention major barrier.    FIM:  Comprehension Comprehension Mode: Auditory Comprehension: 3-Understands basic 50 - 74% of the time/requires cueing 25 - 50%  of the time Expression Expression Mode: Verbal Expression: 3-Expresses basic 50 - 74% of the  time/requires cueing 25 - 50% of the time. Needs to repeat parts of sentences. Social Interaction Social Interaction: 2-Interacts appropriately 25 - 49% of time - Needs frequent redirection. Problem Solving Problem Solving: 2-Solves basic 25 - 49% of the time - needs direction more than half the time to initiate, plan or complete simple activities Memory Memory: 2-Recognizes or recalls 25 - 49% of the time/requires cueing 51 - 75% of the time FIM - Eating Eating Activity: 5: Set-up assist for open containers  Pain Pain Assessment Pain Assessment: No/denies pain  Therapy/Group: Individual Therapy  Orbin Mayeux, Riley Nearing 02/24/2013, 12:19 PM

## 2013-02-24 NOTE — Care Management Note (Signed)
[facility] Individual Statement of Services  Patient Name:  [name]  Date:  02/24/2013  Welcome to the [facility].  Our goal is to provide you with an individualized program based on your diagnosis and situation, designed to meet your specific needs.  With this comprehensive rehabilitation program, you will be expected to participate in at least 3 hours of rehabilitation therapies Monday-Friday, with modified therapy programming on the weekends.  Your rehabilitation program will include the following services:  Physical Therapy (PT), Occupational Therapy (OT), Speech Therapy (ST), 24 hour per day rehabilitation nursing, Therapeutic Recreaction (TR), Neuropsychology, Case Management ( Social Worker), Rehabilitation Medicine, Nutrition Services and Pharmacy Services  Weekly team conferences will be held on Wednesday to discuss your progress.  Your Social Worker will talk with you frequently to get your input and to update you on team discussions.  Team conferences with you and your family in attendance may also be held.  Expected length of stay: 3-4 weeks Overall anticipated outcome: min level  Depending on your progress and recovery, your program may change. Your Social Worker will coordinate services and will keep you informed of any changes. Your Child psychotherapist names and contact numbers are listed  below.  The following services may also be recommended but are not provided by the [facility]:   Driving Evaluations  Home Health Rehabiltiation Services  Outpatient Rehabilitatation Servives    Arrangements will be made to provide these services after discharge if needed.  Arrangements include referral to agencies that provide these services.  Your insurance has been verified to be:  Medicare Your primary doctor is:  Dr. Marva Panda  Pertinent information will be shared with your doctor and your insurance  company.  Social Worker:  Dossie Der, [place] [phone]  Information discussed with and copy given to patient by: Lucy Chris, 02/24/2013, 1:30 PM

## 2013-02-24 NOTE — Progress Notes (Signed)
Patient ID: ZABDI MIS, female   DOB: 04-22-1976, 37 y.o.   MRN: [id number] Subjective/Complaints: y.o. 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 Admitted 02/16/2013 with complaints of headache and left-sided weakness with altered mental status. INR on admission of 1.14 with noted poor compliance on Coumadin. MRI of the brain showed acute infarct right MCA territory without hemorrhage as well as scattered small areas of acute infarct left frontal and parietal lobe. Patient did not receive TPA. Echocardiogram with ejection fraction of 60% without embolism. 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 There was a filling defect involving the innominate artery thought likely to represent thrombus. There was also occlusion of the right cavernous carotid and right M1 segment indicate of embolic phenomenon. Underwent ICA thrombectomy 02/16/2013 per interventional radiology. Patient had been placed on aspirin therapy per neurology services and Coumadin later resumed of which patient adamantly refused and patient was placed thus patient was placed on Xarelto for stroke prevention 02/20/2013 Working with speech , good awareness of R brain language dysfx Review of Systems  Neurological: Positive for dizziness and focal weakness.  All other systems reviewed and are negative.    Objective: Vital Signs: Blood pressure 123/66, pulse 74, temperature 97.5 F (36.4 C), temperature source Oral, resp. rate 16, weight 67.7 kg (149 lb 4 oz), last menstrual period 02/15/2013, SpO2 100.00%. No results found. Results for orders placed during the hospital encounter of 02/21/13 (from the past 72 hour(s))  GLUCOSE, CAPILLARY     Status: Abnormal   Collection Time    02/21/13  5:28 PM      Result Value  Range   Glucose-Capillary 198 (*) 70 - 99 mg/dL  GLUCOSE, CAPILLARY     Status: None   Collection Time    02/22/13 11:56 AM      Result Value Range   Glucose-Capillary 86  70 - 99 mg/dL  GLUCOSE, CAPILLARY     Status: None   Collection Time    02/22/13  8:53 PM      Result Value Range   Glucose-Capillary 99  70 - 99 mg/dL  CBC WITH DIFFERENTIAL     Status: Abnormal   Collection Time    02/23/13  6:55 AM      Result Value Range   WBC 12.6 (*) 4.0 - 10.5 K/uL   RBC 3.43 (*) 3.87 - 5.11 MIL/uL   Hemoglobin 8.6 (*) 12.0 - 15.0 g/dL   HCT 40.9 (*) 81.1 - 91.4 %   MCV 77.0 (*) 78.0 - 100.0 fL   MCH 25.1 (*) 26.0 - 34.0 pg   MCHC 32.6  30.0 - 36.0 g/dL   RDW 78.2 (*) 95.6 - 21.3 %   Platelets 393  150 - 400 K/uL   Neutrophils Relative 70  43 - 77 %   Neutro Abs 8.7 (*) 1.7 - 7.7 K/uL   Lymphocytes Relative 24  12 - 46 %   Lymphs Abs 3.1  0.7 - 4.0 K/uL   Monocytes Relative 5  3 - 12 %   Monocytes Absolute 0.6  0.1 - 1.0 K/uL   Eosinophils Relative 1  0 - 5 %   Eosinophils Absolute 0.1  0.0 - 0.7 K/uL   Basophils Relative 0  0 - 1 %   Basophils Absolute 0.0  0.0 -  0.1 K/uL  COMPREHENSIVE METABOLIC PANEL     Status: Abnormal   Collection Time    02/23/13  6:55 AM      Result Value Range   Sodium 141  135 - 145 mEq/L   Potassium 3.3 (*) 3.5 - 5.1 mEq/L   Chloride 106  96 - 112 mEq/L   CO2 26  19 - 32 mEq/L   Glucose, Bld 85  70 - 99 mg/dL   BUN 12  6 - 23 mg/dL   Creatinine, Ser 1.61  0.50 - 1.10 mg/dL   Calcium 8.7  8.4 - 09.6 mg/dL   Total Protein 6.5  6.0 - 8.3 g/dL   Albumin 2.7 (*) 3.5 - 5.2 g/dL   AST 13  0 - 37 U/L   ALT 7  0 - 35 U/L   Alkaline Phosphatase 51  39 - 117 U/L   Total Bilirubin 0.3  0.3 - 1.2 mg/dL   GFR calc non Af Amer >90  >90 mL/min   GFR calc Af Amer >90  >90 mL/min   Comment:            The eGFR has been calculated     using the CKD EPI equation.     This calculation has not been     validated in all clinical     situations.     eGFR's  persistently     <90 mL/min signify     possible Chronic Kidney Disease.     HEENT: normal Heart regular rate and rhythm no rubs murmurs or exercise Abdomen positive bowel sounds soft nontender palpation Extremities no clubbing cyanosis or edema Musculoskeletal no shoulder pain with range of motion left side. Motor strength 5/5 in the right deltoid, biceps, triceps, grip, hip flexor, knee extensors, ankle dorsiflexor plantar flexor 2 minus/5 in the left deltoid, biceps, triceps, grip 3 minus in the hip flexor knee extensor ankle dorsiflexor and flexor left Sensation is able to identify light touch in the left upper and left lower extremity as well as on the right side. Evidence of ataxia finger nose to finger testing on the right side Unable to do finger-nose-finger testing on the left side secondary to weakness Unable to cooperate with visual field testing +aprosodia  Assessment/Plan: 1. Functional deficits secondary to R MCA infarct which require 3+ hours per day of interdisciplinary therapy in a comprehensive inpatient rehab setting. Physiatrist is providing close team supervision and 24 hour management of active medical problems listed below. Physiatrist and rehab team continue to assess barriers to discharge/monitor patient progress toward functional and medical goals. FIM: FIM - Bathing Bathing Steps Patient Completed: Right upper leg;Left upper leg;Right lower leg (including foot) Bathing: 2: Max-Patient completes 3-4 42f 10 parts or 25-49%  FIM - Upper Body Dressing/Undressing Upper body dressing/undressing: 0: Activity did not occur FIM - Lower Body Dressing/Undressing Lower body dressing/undressing: 0: Activity did not occur  FIM - Toileting Toileting steps completed by patient: Performs perineal hygiene Toileting: 0: Activity did not occur  FIM - Diplomatic Services operational officer Devices: Psychiatrist Transfers: 1-Two helpers  FIM - Physiological scientist Devices: Bed rails Bed/Chair Transfer: 1: Two helpers  FIM - Locomotion: Printmaker: Wheelchair: 0: Activity did not occur FIM - Locomotion: Ambulation Ambulation/Gait Assistance: Not tested (comment) Locomotion: Ambulation: 0: Activity did not occur  Comprehension Comprehension Mode: Auditory Comprehension: 2-Understands basic 25 - 49% of the time/requires cueing 51 - 75% of the time  Expression Expression Mode: Verbal Expression: 2-Expresses basic 25 - 49% of the time/requires cueing 50 - 75% of the time. Uses single words/gestures.  Social Interaction Social Interaction: 2-Interacts appropriately 25 - 49% of time - Needs frequent redirection.  Problem Solving Problem Solving: 2-Solves basic 25 - 49% of the time - needs direction more than half the time to initiate, plan or complete simple activities  Memory Memory: 2-Recognizes or recalls 25 - 49% of the time/requires cueing 51 - 75% of the time  Medical Problem List and Plan:  1. Thromboembolic right MCA infarct  2. DVT Prophylaxis/Anticoagulation: IVC filter/ Xarelto. Monitor for any signs of bleeding  3. Neuropsych: This patient is not capable of making decisions on his/her own behalf.  -will need constant positive reinforcement and education  -mother appears quite positive and supportive however  4. History of lupus. Continue prednisone (pt denies dx), CBGs look ok this will be a chronic med, may be able to reduce dose 5. MRSA positive nasal swab. Contact precautions  6. History of medical noncompliance. Counseling  7. Microcytic anemia. Latest hemoglobin 8.6. Followup CBC   LOS (Days) 3 A FACE TO FACE EVALUATION WAS PERFORMED  KIRSTEINS,ANDREW E 02/24/2013, 9:05 AM

## 2013-02-24 NOTE — Progress Notes (Signed)
Occupational Therapy Session Note  Patient Details  Name: [name] MRN: [id number] Date of Birth: 17-Sep-1976  Today's Date: 02/24/2013 Time: 0930-1030 Time Calculation (min): 60 min  Short Term Goals: Week 1:  OT Short Term Goal 1 (Week 1): Pt.  will maintain midline control for 5 minutes during grooming activity OT Short Term Goal 2 (Week 1): Pt. will perform grooming with mod assist OT Short Term Goal 3 (Week 1): Pt. will perform UB bathing with minimal assist and max with portural control OT Short Term Goal 4 (Week 1): Pt. will bathe LB with mod assist and max assist with sitting balance OT Short Term Goal 5 (Week 1): Pt. will dress UB with mod assist  Skilled Therapeutic Interventions/Progress Updates:  No c/o pain this session. Pt seen for bed mobility skills, balance, postural control, grooming, bathing and dressing.  Pt was able to roll to right with min and sit up with mod assist. She was able to maintain static sitting for several minutes with only close supervision.  She sits with left trunk elongation with pelvis tilted to left. She was able to complete squat/ semi-stand pivot transfer to left with mod assist with mod verbal cues. In w/c pt needed continual cues to sit straight and she was able to actively correct posture.  Pt has great difficult with attention to task and is internally distracted.  She demonstrates very poor insight as she continually asked if her mom could bring the car to the hospital for her to practice driving with.  Pt demonstrated improved initiation with all self care tasks but also a great deal of perseveration on tasks.  Pt left in w/c with quick release belt, lap tray, and call light in place.  Therapy Documentation Precautions:  Precautions Precautions: Fall Precaution Comments: lethargic, leans to L Restrictions Weight Bearing Restrictions: No   ADL: See FIM for current functional status  Therapy/Group: Individual  Therapy  SAGUIER,JULIA 02/24/2013, 11:38 AM

## 2013-02-24 NOTE — Progress Notes (Signed)
Physical Therapy Session Note  Patient Details  Name: [name] MRN: [id number] Date of Birth: 1976-06-04  Today's Date: 02/24/2013 Time: 1430-1530 Time Calculation (min): 60 min  Short Term Goals: Week 1:  PT Short Term Goal 1 (Week 1): Patient will perform bed mobility with min A,including rolling R and L and supine to sit with min A PT Short Term Goal 2 (Week 1): Sitting EOB with min A and decreased LOB to the L. PT Short Term Goal 3 (Week 1): Patient will perform transfers with mod A from bed to w/c .  Skilled Therapeutic Interventions/Progress Updates:  Patient resting in bed upon entering room. Patient requesting to contact dining regarding dessert for dinner. Neuro muscular reeducation to left LE through active assistive exercise and weight bearing in supine. Patient with active dorsiflexion on left and hip extension through bridging. Patient supine to sit with mod assist. Patient sat edge of bed with min assist to call dining. Patient required cueing to initiate and attend to task. Patient max assist stand pivot transfer bed to wheelchair. Patient max assist sit to stand and ambulate 30 feet using railing in hallway. Patient max to total assist to progress left LE and to assist with knee extension during stance. Patient sit to stand and stood 5 minutes in standing frame without posterior strap to work on left hip and knee extension. Patient performed activity with right UE to increase weight bearing on LE's. Patient transferred wheelchair to bed with max assist. Patient sit to supine with mod assist. Patient left in bed with all items in reach.  Therapy Documentation Precautions:  Precautions Precautions: Fall Precaution Comments: lethargic, leans to L Restrictions Weight Bearing Restrictions: No Pain: Pain Assessment Pain Assessment: 0-10 Pain Score:   4 Pain Location: Leg Pain Orientation: Left  Locomotion : Ambulation Ambulation/Gait Assistance: 2: Max assist   See FIM  for current functional status  Therapy/Group: Individual Therapy  [name] 02/24/2013, 4:03 PM

## 2013-02-24 NOTE — Plan of Care (Signed)
Problem: RH BOWEL ELIMINATION Goal: RH STG MANAGE BOWEL W/MEDICATION W/ASSISTANCE STG Manage Bowel with Medication with MOD I Assistance.  Outcome: Not Progressing Pt refusing laxatives

## 2013-02-25 ENCOUNTER — Inpatient Hospital Stay (HOSPITAL_COMMUNITY): Payer: TRICARE For Life (TFL)

## 2013-02-25 ENCOUNTER — Inpatient Hospital Stay (HOSPITAL_COMMUNITY): Payer: Medicare Other

## 2013-02-25 ENCOUNTER — Inpatient Hospital Stay (HOSPITAL_COMMUNITY): Payer: Medicare Other | Admitting: Speech Pathology

## 2013-02-25 MED ORDER — BOOST / RESOURCE BREEZE PO LIQD
1.0000 | Freq: Three times a day (TID) | ORAL | Status: DC
  Administered 2013-02-25 – 2013-03-10 (×33): 1 via ORAL

## 2013-02-25 MED ORDER — TRAMADOL HCL 50 MG PO TABS
50.0000 mg | ORAL_TABLET | Freq: Four times a day (QID) | ORAL | Status: DC | PRN
  Administered 2013-02-25 – 2013-03-19 (×8): 50 mg via ORAL
  Filled 2013-02-25 (×8): qty 1

## 2013-02-25 MED ORDER — METHYLPHENIDATE HCL 5 MG PO TABS
5.0000 mg | ORAL_TABLET | Freq: Two times a day (BID) | ORAL | Status: DC
  Administered 2013-02-25 – 2013-03-20 (×41): 5 mg via ORAL
  Filled 2013-02-25 (×42): qty 1

## 2013-02-25 NOTE — Patient Care Conference (Signed)
Inpatient RehabilitationTeam Conference and Plan of Care Update Date: 02/25/2013   Time: 10:55 Am    Patient Name: [name]      Medical Record Number: [id number]  Date of Birth: May 04, 1976 Sex: Female         Room/Bed: 4039/4039-01 Payor Info: Payor: MEDICARE  Plan: MEDICARE PART A AND B  Product Type: *No Product type*     Admitting Diagnosis: rt cva  Admit Date/Time:  02/21/2013  8:03 PM Admission Comments: No comment available   Primary Diagnosis:  Embolic infarction Principal Problem: Embolic infarction  Patient Active Problem List   Diagnosis Date Noted  . 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 Postprocedural respiratory failure 02/16/2013  . Essential hypertension, malignant 02/16/2013  . Sinus tachycardia 02/16/2013  . CVA (cerebral infarction) 12/05/2012  . Subtherapeutic international normalized ratio (INR) 12/05/2012  . ANEMIA, IRON DEFICIENCY 09/04/2006  . DEPRESSION 09/04/2006  . 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 THROMBOSIS, VENOUS 09/04/2006  . CONSTIPATION 09/04/2006  . OVARIAN CYST 09/04/2006  . FOOT DROP, LEFT 09/04/2006    Expected Discharge Date: Expected Discharge Date: 03/20/13  Team Members Present: Physician leading conference: Dr. Claudette Laws Social Worker Present: Dossie Der, LCSW Nurse Present: Chana Bode, RN PT Present: [name], PT;Becky Henrene Dodge, PT;Caroline Jane Lew, PT;Other (comment) Clarisse Gouge Ripa-PT) OT Present: Leonette Monarch, Loistine Chance, OT SLP Present: Fae Pippin, SLP Other (Discipline and Name): Charolette Child Coordinator     Current Status/Progress Goal Weekly Team Focus  Medical   Poor awareness of deficits, left-sided weakness has not improved since admission any significant degree  Improve midline awareness  Multidisciplinary approach to right MCA deficits.   Bowel/Bladder   occ incont of bladder;p no BM since 3/26  cont of B+B  laxative given, timed toileting   Swallow/Nutrition/ Hydration   Dys.3 textures and  thin liquids with intermittent staff supervision   least restrictive p.o. intake  increase awarenss, self monitoring and correcting   ADL's   total assist  min assist overall  ADL retraining, LUE neuro re-ed, functional mobility training, balance activities   Mobility   mod assist with bed mobility; max assist with transfers; max assist amb 30 feet using railing in hallway  min assist transfers and short distance ambulation  activity tolerance; NM re-education left LE; transfers; standing tolerance   Communication   mod-max   min assist  increase initiation   Safety/Cognition/ Behavioral Observations  max assist  min assist  increase initiation, awareness, self-monitoring and correcting   Pain   headaches uncntrled with tyenol;p added Ultram  pain controlled with prn medication  assess effectiveness of  tramadol   Skin   N/A            *See Care Plan and progress notes for long and short-term goals.  Barriers to Discharge: Will need 24 7 care post discharge    Possible Resolutions to Barriers:  Identify caregiver    Discharge Planning/Teaching Needs:  Home with Mom who can provde assist unsure at this time how much awaiting retrun call.      Team Discussion:  Trial ritalin see if more alert and more focused.  Neuro-psych to eval.  Delayed and multiple impairments still trying to clarify function  Revisions to Treatment Plan:  None   Continued Need for Acute Rehabilitation Level of Care: The patient requires daily medical management by a physician with specialized training in physical medicine and rehabilitation for the following conditions: Daily direction of a multidisciplinary physical rehabilitation program to ensure safe  treatment while eliciting the highest outcome that is of practical value to the patient.: Yes Daily medical management of patient stability for increased activity during participation in an intensive rehabilitation regime.: Yes Daily analysis of laboratory  values and/or radiology reports with any subsequent need for medication adjustment of medical intervention for : Neurological problems;Other  Lucy Chris 02/26/2013, 9:56 AM

## 2013-02-25 NOTE — Progress Notes (Signed)
Occupational Therapy Note  Patient Details  Name: [name] MRN: [id number] Date of Birth: 1975-12-22 Today's Date: 02/25/2013  Time: 11:20-12:05pm ( .) Pt seen for 1:1 session focusing on transfers, PROM/stretching to LUE. No reports of pain, although pt seems to hold onto LUE, possibly as a protective mechanism. Pt transferred with squat-pivot from wheelchair <> mat table <> wheelchair with max A +2 (for safety.) Once seated in wheelchair, pt requires max cueing to correct positioning. At end of session, mod A to scoot back in wheelchair, with pt reporting she was fatigued. While sitting at edge of mat table, pt self corrected L lateral lean several times. PROM to LUE with HOH sliding towel on tray table. Also passive stretching to sh flexors, elbow flexors/extensors, wrist flexors/extensor. Scapular mobilization to L sh. Left pt in room in wheelchair with quick release belt donned and call bell in lap.   [name] 02/25/2013, 12:11 PM

## 2013-02-25 NOTE — Plan of Care (Signed)
Problem: RH BOWEL ELIMINATION Goal: RH STG MANAGE BOWEL WITH ASSISTANCE STG Manage Bowel with MOD I Assistance.  Outcome: Not Progressing No BM since 3/26; laxatve given and encouraged fluids

## 2013-02-25 NOTE — Progress Notes (Signed)
Speech Language Pathology Daily Session Note  Patient Details  Name: [name] MRN: [id number] Date of Birth: 12-18-1975  Today's Date: 02/25/2013 Time: 7829-5621 Time Calculation (min): 50 min  Short Term Goals: Week 1: SLP Short Term Goal 1 (Week 1): Patient will initiate verbal communication with communication partner in response to questions with mod assist clinician cues.  SLP Short Term Goal 2 (Week 1): Patient will attend to left visual field during a functional ADL with moderate clinician cues.  SLP Short Term Goal 3 (Week 1): Patient will sustain attention to basic functional ADL for 15 minutes with moderate clinician cues.  SLP Short Term Goal 4 (Week 1): Patient will demonstrate intellectual awareness of deficits post CVA by naming 3 changes with moderate questions cues.  SLP Short Term Goal 5 (Week 1): Patient will recall and retuen use of call bell to request assistance with mod assist question cues.   Skilled Therapeutic Interventions: Skilled treatment session focused on addressing cognitive goals.  SLP facilitated session with total assist to initiate getting out of bed.  Patient presented with a flat affect and limited social initiation throughout session.  Per patient request SLP attempted to facilitate session by teaching oral motor exercises with verbal visual and tactile cues; however, patient required max assist cues to initiate exercises with limited range of motion.  Patient perseverated on her hair needing to be done.  RN present for medication administration and SLP provided max assist cues to attend to and initiate taking each pill.     FIM:  Comprehension Comprehension Mode: Auditory Comprehension: 3-Understands basic 50 - 74% of the time/requires cueing 25 - 50%  of the time Expression Expression Mode: Verbal Expression: 3-Expresses basic 50 - 74% of the time/requires cueing 25 - 50% of the time. Needs to repeat parts of sentences. Social Interaction Social  Interaction: 2-Interacts appropriately 25 - 49% of time - Needs frequent redirection. Problem Solving Problem Solving: 2-Solves basic 25 - 49% of the time - needs direction more than half the time to initiate, plan or complete simple activities Memory Memory: 2-Recognizes or recalls 25 - 49% of the time/requires cueing 51 - 75% of the time FIM - Eating Eating Activity: 5: Supervision/cues  Pain Pain Assessment Pain Assessment: Yes Pain Score:   6 Pain Location: Head Pain Descriptors: Aching;Headache Pain Onset: Gradual Patients Stated Pain Goal: 2 Pain Intervention(s): RN notified and administered medication   Therapy/Group: Individual Therapy  Charlane Ferretti., CCC-SLP [phone]  Anibal Quinby 02/25/2013, 1:14 PM

## 2013-02-25 NOTE — Progress Notes (Signed)
Patient ID: [name], female   DOB: [birth year], 37 y.o.   MRN: [id number] Subjective/Complaints: y.o. 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 Admitted 02/16/2013 with complaints of headache and left-sided weakness with altered mental status. INR on admission of 1.14 with noted poor compliance on Coumadin. MRI of the brain showed acute infarct right MCA territory without hemorrhage as well as scattered small areas of acute infarct left frontal and parietal lobe. Patient did not receive TPA. Echocardiogram with ejection fraction of 60% without embolism. 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 There was a filling defect involving the innominate artery thought likely to represent thrombus. There was also occlusion of the right cavernous carotid and right M1 segment indicate of embolic phenomenon. Underwent ICA thrombectomy 02/16/2013 per interventional radiology. Patient had been placed on aspirin therapy per neurology services and Coumadin later resumed of which patient adamantly refused and patient was placed thus patient was placed on Xarelto for stroke prevention 02/20/2013  No pain c/o s Review of Systems  Neurological: Positive for dizziness and focal weakness.  All other systems reviewed and are negative.    Objective: Vital Signs: Blood pressure 140/80, pulse 68, temperature 98.5 F (36.9 C), temperature source Oral, resp. rate 16, weight 67.7 kg (149 lb 4 oz), last menstrual period 02/15/2013, SpO2 100.00%. No results found. Results for orders placed during the hospital encounter of 02/21/13 (from the past 72 hour(s))  GLUCOSE, CAPILLARY     Status: None   Collection Time    02/22/13 11:56 AM      Result Value Range   Glucose-Capillary 86  70 - 99 mg/dL   GLUCOSE, CAPILLARY     Status: None   Collection Time    02/22/13  8:53 PM      Result Value Range   Glucose-Capillary 99  70 - 99 mg/dL  CBC WITH DIFFERENTIAL     Status: Abnormal   Collection Time    02/23/13  6:55 AM      Result Value Range   WBC 12.6 (*) 4.0 - 10.5 K/uL   RBC 3.43 (*) 3.87 - 5.11 MIL/uL   Hemoglobin 8.6 (*) 12.0 - 15.0 g/dL   HCT 62.9 (*) 52.8 - 41.3 %   MCV 77.0 (*) 78.0 - 100.0 fL   MCH 25.1 (*) 26.0 - 34.0 pg   MCHC 32.6  30.0 - 36.0 g/dL   RDW 24.4 (*) 01.0 - 27.2 %   Platelets 393  150 - 400 K/uL   Neutrophils Relative 70  43 - 77 %   Neutro Abs 8.7 (*) 1.7 - 7.7 K/uL   Lymphocytes Relative 24  12 - 46 %   Lymphs Abs 3.1  0.7 - 4.0 K/uL   Monocytes Relative 5  3 - 12 %   Monocytes Absolute 0.6  0.1 - 1.0 K/uL   Eosinophils Relative 1  0 - 5 %   Eosinophils Absolute 0.1  0.0 - 0.7 K/uL   Basophils Relative 0  0 - 1 %   Basophils Absolute 0.0  0.0 - 0.1 K/uL  COMPREHENSIVE METABOLIC PANEL     Status: Abnormal   Collection Time    02/23/13  6:55 AM      Result Value Range   Sodium 141  135 - 145 mEq/L   Potassium  3.3 (*) 3.5 - 5.1 mEq/L   Chloride 106  96 - 112 mEq/L   CO2 26  19 - 32 mEq/L   Glucose, Bld 85  70 - 99 mg/dL   BUN 12  6 - 23 mg/dL   Creatinine, Ser 4.09  0.50 - 1.10 mg/dL   Calcium 8.7  8.4 - 81.1 mg/dL   Total Protein 6.5  6.0 - 8.3 g/dL   Albumin 2.7 (*) 3.5 - 5.2 g/dL   AST 13  0 - 37 U/L   ALT 7  0 - 35 U/L   Alkaline Phosphatase 51  39 - 117 U/L   Total Bilirubin 0.3  0.3 - 1.2 mg/dL   GFR calc non Af Amer >90  >90 mL/min   GFR calc Af Amer >90  >90 mL/min   Comment:            The eGFR has been calculated     using the CKD EPI equation.     This calculation has not been     validated in all clinical     situations.     eGFR's persistently     <90 mL/min signify     possible Chronic Kidney Disease.     HEENT: normal Heart regular rate and rhythm no rubs murmurs or exercise Abdomen positive bowel sounds soft  nontender palpation Extremities no clubbing cyanosis or edema Musculoskeletal no shoulder pain with range of motion left side. Motor strength 5/5 in the right deltoid, biceps, triceps, grip, hip flexor, knee extensors, ankle dorsiflexor plantar flexor 2 minus/5 in the left deltoid, biceps, triceps, grip 3 minus in the hip flexor knee extensor ankle dorsiflexor and flexor left Sensation is able to identify light touch in the left upper and left lower extremity as well as on the right side. Evidence of ataxia finger nose to finger testing on the right side Unable to do finger-nose-finger testing on the left side secondary to weakness Unable to cooperate with visual field testing +aprosodia  Assessment/Plan: 1. Functional deficits secondary to R MCA infarct which require 3+ hours per day of interdisciplinary therapy in a comprehensive inpatient rehab setting. Physiatrist is providing close team supervision and 24 hour management of active medical problems listed below. Physiatrist and rehab team continue to assess barriers to discharge/monitor patient progress toward functional and medical goals. FIM: FIM - Bathing Bathing Steps Patient Completed: Right upper leg;Left upper leg;Right lower leg (including foot) Bathing: 2: Max-Patient completes 3-4 7f 10 parts or 25-49%  FIM - Upper Body Dressing/Undressing Upper body dressing/undressing: 0: Wears gown/pajamas-no public clothing FIM - Lower Body Dressing/Undressing Lower body dressing/undressing: 1: Total-Patient completed less than 25% of tasks  FIM - Toileting Toileting steps completed by patient: Performs perineal hygiene Toileting: 0: Activity did not occur  FIM - Diplomatic Services operational officer Devices: Psychiatrist Transfers: 1-Two helpers  FIM - Banker Devices: Bed rails;HOB elevated;Arm rests Bed/Chair Transfer: 3: Supine > Sit: Mod A (lifting assist/Pt. 50-74%/lift  2 legs;3: Sit > Supine: Mod A (lifting assist/Pt. 50-74%/lift 2 legs);2: Bed > Chair or W/C: Max A (lift and lower assist);2: Chair or W/C > Bed: Max A (lift and lower assist)  FIM - Locomotion: Wheelchair Locomotion: Wheelchair: 1: Total Assistance/staff pushes wheelchair (Pt<25%) FIM - Locomotion: Ambulation Locomotion: Ambulation Assistive Devices: Other (comment) (rail in hallway) Ambulation/Gait Assistance: 2: Max assist Locomotion: Ambulation: 1: Travels less than 50 ft with maximal assistance (Pt: 25 - 49%)  Comprehension  Comprehension Mode: Auditory Comprehension: 3-Understands basic 50 - 74% of the time/requires cueing 25 - 50%  of the time  Expression Expression Mode: Verbal Expression: 3-Expresses basic 50 - 74% of the time/requires cueing 25 - 50% of the time. Needs to repeat parts of sentences.  Social Interaction Social Interaction: 2-Interacts appropriately 25 - 49% of time - Needs frequent redirection.  Problem Solving Problem Solving: 2-Solves basic 25 - 49% of the time - needs direction more than half the time to initiate, plan or complete simple activities  Memory Memory: 2-Recognizes or recalls 25 - 49% of the time/requires cueing 51 - 75% of the time  Medical Problem List and Plan:  1. Thromboembolic right MCA infarct laeft hemiparesis 2. DVT Prophylaxis/Anticoagulation: IVC filter/ Xarelto. Monitor for any signs of bleeding  3. Neuropsych: This patient is not capable of making decisions on his/her own behalf.  -will need constant positive reinforcement and education  -mother appears quite positive and supportive however  4. History of lupus. Continue prednisone (pt denies dx), CBGs look ok this will be a chronic med, may be able to reduce dose 5. MRSA positive nasal swab. Contact precautions  6. History of medical noncompliance. Counseling  7. Microcytic anemia. Latest hemoglobin 8.6. Followup CBC   LOS (Days) 4 A FACE TO FACE EVALUATION WAS  PERFORMED  KIRSTEINS,ANDREW E 02/25/2013, 8:51 AM

## 2013-02-25 NOTE — Progress Notes (Signed)
Physical Therapy Session Note  Patient Details  Name: [name] MRN: [id number] Date of Birth: 1976-02-22  Today's Date: 02/25/2013 Time: 1400-1450 Time Calculation (min): 50 min  Short Term Goals: Week 1:  PT Short Term Goal 1 (Week 1): Patient will perform bed mobility with min A,including rolling R and L and supine to sit with min A PT Short Term Goal 2 (Week 1): Sitting EOB with min A and decreased LOB to the L. PT Short Term Goal 3 (Week 1): Patient will perform transfers with mod A from bed to w/c .  Skilled Therapeutic Interventions/Progress Updates:  Pt lethargic, initially declined tx, wanting to sleep.  Friend Ollie in room, encouraging pt.  Rolling L with min assist, sidelying > sit with total assist.  Bed> w/c squat pivot to L with total assist, pt performing less than 25%.  Max cueing for sequencing, safety of L UE, position of hips on w/c.  neuromuscular re-education via VCs, manual cues for gait in hall using rail on R, +2 assist, focusing on trunk extension, L pelvic and hip activation, forward gaze.  Pt tends to retract L pelvis.  She initiated L hip flexion, but was not able to progress LLE.  L knee buckles or hyperextends with full wt bearing. In standing frame, terminal hip extension x 5 reps x 3, with decreasing manual cues over trials. Mass LLE extension elicited with reaching with R hand overhead.  Therapist encouraged to sit up in w/c for another hour or so.  Safety belt placed on pt in w/c, all needs within reach.  Therapist encouraged friend to sit on pt's L side to encourage attention to L.    Therapy Documentation Precautions:  Precautions Precautions: Fall Precaution Comments: lethargic, leans to L; delayed processing Restrictions Weight Bearing Restrictions: No   Pain: Pain Assessment Pain Assessment: No/denies pain     Other Treatments: Treatments Neuromuscular Facilitation: Left;Lower Extremity;Forced use;Activity to increase motor  control;Activity to increase lateral weight shifting See FIM for current functional status  Therapy/Group: Individual Therapy  Shamra Bradeen 02/25/2013, 3:03 PM

## 2013-02-25 NOTE — Progress Notes (Signed)
Occupational Therapy Session Note  Patient Details  Name: [name] MRN: [id number] Date of Birth: 1976/01/06  Today's Date: 02/25/2013 Time: 1005-1105 Time Calculation (min): 60 min  Short Term Goals: Week 1:  OT Short Term Goal 1 (Week 1): Pt.  will maintain midline control for 5 minutes during grooming activity OT Short Term Goal 2 (Week 1): Pt. will perform grooming with mod assist OT Short Term Goal 3 (Week 1): Pt. will perform UB bathing with minimal assist and max with portural control OT Short Term Goal 4 (Week 1): Pt. will bathe LB with mod assist and max assist with sitting balance OT Short Term Goal 5 (Week 1): Pt. will dress UB with mod assist  Skilled Therapeutic Interventions/Progress Updates:    Pt seen for B/D with a focus on left side awareness, sequencing of tasks, attention to tasks, and sitting balance.  +2 helpers today as a roll in shower chair was used. Pt transferred to the right w/c to shower chair and back with a stand pivot with mod assist. In shower she was able to bathe 7/10 parts but needed cuing to focus on tasks and not perseverate. She was belted into shower chair but needed mod assist to support balance as she was reaching down to left foot. Pt stood with mod assist to have underwear pulled up. She was fairly engaged, but often needed cues to re-engaged as she would easily become distracted. Mod cues to attend to her LUE.   Pt left in w/c with safety belt with call light.  Therapy Documentation Precautions:  Precautions Precautions: Fall Precaution Comments: lethargic, leans to L Restrictions Weight Bearing Restrictions: No  Pain: Pain Assessment Pain Assessment: No/denies pain  ADL: See FIM for current functional status  Therapy/Group: Individual Therapy  Anis Cinelli 02/25/2013, 11:28 AM

## 2013-02-25 NOTE — Progress Notes (Signed)
NUTRITION FOLLOW UP  Intervention:   1. Increase Resource Breeze to TID per patient's preferences. 2. RD to continue to follow nutrition care plan  Nutrition Dx:   Increased nutrient needs related to acute illness/injury as evidenced by estimated needs.   Goal:   Intake to meet >90% of estimated nutrition needs.  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 Admitted 3/24 with complaints of headache and left-sided weakness with AMS. MRI of the brain showed acute infarct right MCA territory without hemorrhage as well as scattered small areas of acute infarct left frontal and parietal lobe. Underwent ICA thrombectomy 03/24.   RD asked by RN today to evaluate increasing patient's order for [employer]. Currently ordered for [employer] daily however she is requesting to drink multiple containers daily. Eating meals well, per chart.  Height: Ht Readings from Last 1 Encounters:  02/16/13 5\' 8"  (1.727 m)    Weight Status:   Wt Readings from Last 1 Encounters:  02/25/13 141 lb 15.6 oz (64.4 kg)    Re-estimated needs:  Kcal: 1650 - 1900 Protein: 90 - 100 Fluid: at least 1.5 liters daily  Skin: intact  Diet Order: Dysphagia 3; thin   Intake/Output Summary (Last 24 hours) at 02/25/13 1215 Last data filed at 02/25/13 0800  Gross per 24 hour  Intake    840 ml  Output      0 ml  Net    840 ml    Last BM: 3/26   Labs:   Recent Labs Lab 02/19/13 0505 02/23/13 0655  NA 141 141  K 3.3* 3.3*  CL 106 106  CO2 25 26  BUN 9 12  CREATININE 0.72 0.71  CALCIUM 9.0 8.7  GLUCOSE 81 85    CBG (last 3)   Recent Labs  02/22/13 2053  GLUCAP 99    Scheduled Meds: . feeding supplement  1 Container Oral Q24H  . methylphenidate  5 mg Oral BID WC  . multivitamin  with minerals  1 tablet Oral Daily  . mupirocin ointment  1 application Nasal BID  . predniSONE  20 mg Oral Daily  . rivaroxaban  20 mg Oral Q supper  . temazepam  7.5 mg Oral QHS    Continuous Infusions:  none  Jarold Motto MS, RD, LDN Pager: [phone] After-hours pager: [phone]

## 2013-02-26 ENCOUNTER — Inpatient Hospital Stay (HOSPITAL_COMMUNITY): Payer: Medicare Other | Admitting: Speech Pathology

## 2013-02-26 ENCOUNTER — Inpatient Hospital Stay (HOSPITAL_COMMUNITY): Payer: TRICARE For Life (TFL)

## 2013-02-26 ENCOUNTER — Encounter (HOSPITAL_COMMUNITY): Payer: TRICARE For Life (TFL)

## 2013-02-26 ENCOUNTER — Inpatient Hospital Stay (HOSPITAL_COMMUNITY): Payer: Medicare Other

## 2013-02-26 ENCOUNTER — Inpatient Hospital Stay (HOSPITAL_COMMUNITY): Payer: Medicare Other | Admitting: *Deleted

## 2013-02-26 NOTE — Progress Notes (Signed)
Physical Therapy Session Note  Patient Details  Name: [name] MRN: [id number] Date of Birth: 1976/02/19  Today's Date: 02/26/2013 Time: 1105-1205 Time Calculation (min): 60 min  Short Term Goals: Week 1:  PT Short Term Goal 1 (Week 1): Patient will perform bed mobility with min A,including rolling R and L and supine to sit with min A PT Short Term Goal 2 (Week 1): Sitting EOB with min A and decreased LOB to the L. PT Short Term Goal 3 (Week 1): Patient will perform transfers with mod A from bed to w/c .  Skilled Therapeutic Interventions/Progress Updates:  Mother observed in tx.  Pt more alert and participatory today.  She called therapist by name consistently, and remembered events from yesterday. Pt needed max cueing to make eye contact and look to the L.  PROM LLE in sitting in preparation for standing.  neuromuscular re-education via manual cues, visual feedback, VCs for: -LLE mass extension from mass flexed position in sitting, minimal hip extension noted 4/10 trials -sit>< stand focusing on anterior pelvic tilt, forward wt shift, = wt bearing bil LEs -LLE stance control in standing with RUE support on hallway railing, wearing L hyperextension brace during lateral wt shifting -LLE swing components; pt dropped L knee passively, but needed assistance each step to advance LLE -gait x 8' with total assist of 1, focusing on upright trunk, forward gaze, L stance control -LLE extension alternating with RLE extension, seated using Kinetron at 30 cm/sec, x 20 cycles x 3.  Pt noted to have some active LLE extension by 3rd set. -w/c>< mat transfers stand pivot  W/c mobility using hemi technique with RUE and RLE, x 50' and x 100' with mod assist, mod cues to stay on R side of hall, safely avoid obstacles on L.  Pt left in room with safety belt on, mother present.      Therapy Documentation Precautions:  Precautions Precautions: Fall Precaution Comments: lethargic, leans to L;  delayed processing Restrictions Weight Bearing Restrictions: No   Pain: Pain Assessment Pain Assessment: No/denies pain   Locomotion : Ambulation Ambulation/Gait Assistance: 1: +1 Total assist Wheelchair Mobility Distance: 100    Other Treatments: Treatments Neuromuscular Facilitation: Left;Lower Extremity;Forced use;Activity to increase motor control;Activity to increase lateral weight shifting  See FIM for current functional status  Therapy/Group: Individual Therapy  Abbye Lao 02/26/2013, 12:22 PM

## 2013-02-26 NOTE — Progress Notes (Signed)
Physical Therapy Session Note  Patient Details  Name: [name] MRN: [id number] Date of Birth: Sep 14, 1976  Today's Date: 02/26/2013 Time: 4098-1191 Time Calculation (min): 29 min  Short Term Goals: Week 1:  PT Short Term Goal 1 (Week 1): Patient will perform bed mobility with min A,including rolling R and L and supine to sit with min A PT Short Term Goal 2 (Week 1): Sitting EOB with min A and decreased LOB to the L. PT Short Term Goal 3 (Week 1): Patient will perform transfers with mod A from bed to w/c .  Skilled Therapeutic Interventions/Progress Updates:    Patient received sitting in wheelchair. Today's session focused on therapeutic exercise, transfers, and maintaining attention to task. Patient requires max-total assist verbal and tactile cues to attend to seated LE there ex. Patient able to complete one repetition of exercises before becoming distracted and requiring redirection to task. Patient performed x10 reps of R LE hip flexion, knee extension, ankle pumps and x10 repetitions of AAROM L LE hip flexion, knee extension, ankle pumps. Patient extremely fatigued and requires frequent rest breaks during session and continues to requires max-total cues to attend to task.  Patient returned to room and left supine in bed with bed alarm on and all needs within reach.  Therapy Documentation Precautions:  Precautions Precautions: Fall Precaution Comments: lethargic, leans to L; delayed processing Restrictions Weight Bearing Restrictions: No Pain: Pain Assessment Pain Assessment: No/denies pain Pain Score: 0-No pain Mobility: Transfers Squat Pivot Transfers: 3: Mod assist;With upper extremity assistance;With armrests Squat Pivot Transfer Details: Tactile cues for initiation;Manual facilitation for placement;Manual facilitation for weight shifting;Verbal cues for precautions/safety;Verbal cues for technique;Verbal cues for sequencing  See FIM for current functional  status  Therapy/Group: Individual Therapy  Sara Herb. Shameer Molstad, PT, DPT  02/26/2013, 4:30 PM

## 2013-02-26 NOTE — Progress Notes (Signed)
Speech Language Pathology Daily Session Note  Patient Details  Name: [name] MRN: [id number] Date of Birth: 1976/06/21  Today's Date: 02/26/2013 Time: 1300-1400 Time Calculation (min): 60 min  Short Term Goals: Week 1: SLP Short Term Goal 1 (Week 1): Patient will initiate verbal communication with communication partner in response to questions with mod assist clinician cues.  SLP Short Term Goal 2 (Week 1): Patient will attend to left visual field during a functional ADL with moderate clinician cues.  SLP Short Term Goal 3 (Week 1): Patient will sustain attention to basic functional ADL for 15 minutes with moderate clinician cues.  SLP Short Term Goal 4 (Week 1): Patient will demonstrate intellectual awareness of deficits post CVA by naming 3 changes with moderate questions cues.  SLP Short Term Goal 5 (Week 1): Patient will recall and retuen use of call bell to request assistance with mod assist question cues.   Skilled Therapeutic Interventions: Skilled treatment session focused on addressing cognitive-linguistic goals.  SLP facilitated session with written sentences and patient oral reading sentences with max assist demonstrative cues to utilize increased inflection and intonation; patient with little carryover without demonstration from SLP.  Patient did demonstrate slightly increased social initiation throughout session.  SLP also facilitated session by educating patient regarding impaired cognition with greatest deficits in attention.  Patient declined several tasks SLP proposed to target sustained attention, but she was agreeable to oral reading. SLP provided max assist visual cues to limit field and track for ~45 seconds maximum.     FIM:  Comprehension Comprehension Mode: Auditory Comprehension: 4-Understands basic 75 - 89% of the time/requires cueing 10 - 24% of the time Expression Expression Mode: Verbal Expression: 3-Expresses basic 50 - 74% of the time/requires cueing 25 -  50% of the time. Needs to repeat parts of sentences. Social Interaction Social Interaction: 3-Interacts appropriately 50 - 74% of the time - May be physically or verbally inappropriate. Problem Solving Problem Solving: 3-Solves basic 50 - 74% of the time/requires cueing 25 - 49% of the time Memory Memory: 3-Recognizes or recalls 50 - 74% of the time/requires cueing 25 - 49% of the time  Pain Pain Assessment Pain Assessment: No/denies pain  Therapy/Group: Individual Therapy  Charlane Ferretti., CCC-SLP [phone]  [name] 02/26/2013, 4:28 PM

## 2013-02-26 NOTE — Progress Notes (Signed)
Occupational Therapy Session Note  Patient Details  Name: [name] MRN: [id number] Date of Birth: 03/16/1976  Today's Date: 02/26/2013 Time: 1000-1105 Time Calculation (min): 65 min  Short Term Goals: Week 1:  OT Short Term Goal 1 (Week 1): Pt.  will maintain midline control for 5 minutes during grooming activity OT Short Term Goal 2 (Week 1): Pt. will perform grooming with mod assist OT Short Term Goal 3 (Week 1): Pt. will perform UB bathing with minimal assist and max with portural control OT Short Term Goal 4 (Week 1): Pt. will bathe LB with mod assist and max assist with sitting balance OT Short Term Goal 5 (Week 1): Pt. will dress UB with mod assist  Skilled Therapeutic Interventions/Progress Updates:    No c/o pain. Pt seen for BADL retraining of B/D at bedside to facilitate sitting balance/ trunk control and attention to tasks. Pt had 2 helpers this am, one to steady assist in sitting and one to help with supplies.  Pt initially lost balance to left in sitting in bed several times and needed mod assist to correct as soon as she took her attention off her balance onto self care tasks.  After several trials she was able to maintain balance with steady assist. She needed continual cuing to stay engaged in tasks and to maintain focus on Lue.  Attempted mirror therapy but pt was having great difficulty attending to task.  Will try this therapy again tomorrow. Pt received 5 min of estim to left wrist extensors.  Pt did not seem to be excited that wrist was lifting but she could feel stimulation and was able to tolerate it well.  Pt's PT had arrived for her next session.  Therapy Documentation Precautions:  Precautions Precautions: Fall Precaution Comments: lethargic, leans to L; delayed processing Restrictions Weight Bearing Restrictions: No     ADL: See FIM for current functional status  Therapy/Group: Individual Therapy  SAGUIER,JULIA 02/26/2013, 12:09 PM

## 2013-02-26 NOTE — Progress Notes (Signed)
Patient ID: KAO BERKHEIMER, female   DOB: 12-21-1975, 37 y.o.   MRN: [id number] Subjective/Complaints: 36y.o. 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 Admitted 02/16/2013 with complaints of headache and left-sided weakness with altered mental status. INR on admission of 1.14 with noted poor compliance on Coumadin. MRI of the brain showed acute infarct right MCA territory without hemorrhage as well as scattered small areas of acute infarct left frontal and parietal lobe. Patient did not receive TPA. Echocardiogram with ejection fraction of 60% without embolism. 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 There was a filling defect involving the innominate artery thought likely to represent thrombus. There was also occlusion of the right cavernous carotid and right M1 segment indicate of embolic phenomenon. Underwent ICA thrombectomy 02/16/2013 per interventional radiology. Patient had been placed on aspirin therapy per neurology services and Coumadin later resumed of which patient adamantly refused and patient was placed thus patient was placed on Xarelto for stroke prevention 02/20/2013  Discussed D/C date with pt  Review of Systems  Neurological: Positive for dizziness and focal weakness.  All other systems reviewed and are negative.    Objective: Vital Signs: Blood pressure 124/72, pulse 78, temperature 97.6 F (36.4 C), temperature source Oral, resp. rate 18, weight 64.4 kg (141 lb 15.6 oz), last menstrual period 02/15/2013, SpO2 98.00%. No results found. No results found for this or any previous visit (from the past 72 hour(s)).   HEENT: normal Heart regular rate and rhythm no rubs murmurs or exercise Abdomen positive bowel sounds soft nontender palpation Extremities no  clubbing cyanosis or edema Musculoskeletal no shoulder pain with range of motion left side. Motor strength 5/5 in the right deltoid, biceps, triceps, grip, hip flexor, knee extensors, ankle dorsiflexor plantar flexor 2 minus/5 in the left deltoid, biceps, triceps, grip 3 minus in the hip flexor knee extensor ankle dorsiflexor and flexor left Sensation is able to identify light touch in the left upper and left lower extremity as well as on the right side. Evidence of ataxia finger nose to finger testing on the right side Unable to do finger-nose-finger testing on the left side secondary to weakness Unable to cooperate with visual field testing +aprosodia  Assessment/Plan: 1. Functional deficits secondary to R MCA infarct which require 3+ hours per day of interdisciplinary therapy in a comprehensive inpatient rehab setting. Physiatrist is providing close team supervision and 24 hour management of active medical problems listed below. Physiatrist and rehab team continue to assess barriers to discharge/monitor patient progress toward functional and medical goals. FIM: FIM - Bathing Bathing Steps Patient Completed: Chest;Left Arm;Abdomen;Front perineal area;Right upper leg;Left upper leg;Right lower leg (including foot) Bathing: 3: Mod-Patient completes 5-7 23f 10 parts or 50-74%  FIM - Upper Body Dressing/Undressing Upper body dressing/undressing: 0: Wears gown/pajamas-no public clothing FIM - Lower Body Dressing/Undressing Lower body dressing/undressing: 1: Total-Patient completed less than 25% of tasks  FIM - Toileting Toileting steps completed by patient: Performs perineal hygiene Toileting: 0: Activity did not occur  FIM - Diplomatic Services operational officer Devices: Psychiatrist Transfers: 1-Two helpers  FIM - Banker Devices: Arm rests Bed/Chair Transfer: 1: Chair or W/C > Bed: Total A (helper does all/Pt. < 25%)  FIM -  Locomotion: Wheelchair Locomotion: Wheelchair: 0: Activity did not occur FIM -  Locomotion: Ambulation Locomotion: Ambulation Assistive Devices: Other (comment) (hallway rail on R; 20') Ambulation/Gait Assistance: 2: Max assist Locomotion: Ambulation: 1: Travels less than 50 ft with maximal assistance (Pt: 25 - 49%)  Comprehension Comprehension Mode: Auditory Comprehension: 3-Understands basic 50 - 74% of the time/requires cueing 25 - 50%  of the time  Expression Expression Mode: Verbal Expression: 3-Expresses basic 50 - 74% of the time/requires cueing 25 - 50% of the time. Needs to repeat parts of sentences.  Social Interaction Social Interaction: 2-Interacts appropriately 25 - 49% of time - Needs frequent redirection.  Problem Solving Problem Solving: 2-Solves basic 25 - 49% of the time - needs direction more than half the time to initiate, plan or complete simple activities  Memory Memory: 2-Recognizes or recalls 25 - 49% of the time/requires cueing 51 - 75% of the time  Medical Problem List and Plan:  1. Thromboembolic right MCA infarct laeft hemiparesis, combination of Takayasu's arteritis and hyper coagulable state 2. DVT Prophylaxis/Anticoagulation: IVC filter/ Xarelto.Lupus anticoagulant antibody positive , non compliant with WarfarinMonitor for any signs of bleeding  3. Neuropsych: This patient is not capable of making decisions on his/her own behalf.  -will need constant positive reinforcement and education  -mother appears quite positive and supportive however  4. History of lupus anticoag Pos, no other clinical signs of SLE Continue prednisone (pt denies dx), CBGs look ok this will be a chronic med, may be able to reduce dose 5. MRSA positive nasal swab. Contact precautions  6. History of medical noncompliance. Counseling  7. Microcytic anemia. Latest hemoglobin 8.6. Followup CBC   LOS (Days) 5 A FACE TO FACE EVALUATION WAS PERFORMED  Jalyn Rosero E 02/26/2013,  8:31 AM

## 2013-02-26 NOTE — Progress Notes (Signed)
Social Work Patient ID: [name], female   DOB: [birth year], 37 y.o.   MRN: [id number] Have left another message for pt's mom to inform of team conference goals and target discharge. Unclear how much physcial assist Mom can provide pt.  Await return call.  Will encourage her to come in and attend Therapies to see how pt is progressing.

## 2013-02-26 NOTE — Progress Notes (Signed)
Social Work Patient ID: [name], female   DOB: 02-Dec-1975, 37 y.o.   MRN: [id number] Spoke with Mom-Mary to discuss team conference goals and target date.  Mom here to observe in therapies with pt. She plans to come often to see pt in therapies.  She reports she will have a helper at home with pt.  Pt smiled and seems to Be feeling better.  Mom agreeable for neuro-psych to see.

## 2013-02-27 ENCOUNTER — Inpatient Hospital Stay (HOSPITAL_COMMUNITY): Payer: Medicare Other | Admitting: Occupational Therapy

## 2013-02-27 ENCOUNTER — Inpatient Hospital Stay (HOSPITAL_COMMUNITY): Payer: Medicare Other | Admitting: Speech Pathology

## 2013-02-27 ENCOUNTER — Inpatient Hospital Stay (HOSPITAL_COMMUNITY): Payer: Medicare Other

## 2013-02-27 ENCOUNTER — Inpatient Hospital Stay (HOSPITAL_COMMUNITY): Payer: Medicare Other | Admitting: Physical Therapy

## 2013-02-27 DIAGNOSIS — I69993 Ataxia following unspecified cerebrovascular disease: Secondary | ICD-10-CM

## 2013-02-27 DIAGNOSIS — I6340 Cerebral infarction due to embolism of unspecified cerebral artery: Secondary | ICD-10-CM

## 2013-02-27 DIAGNOSIS — G8110 Spastic hemiplegia affecting unspecified side: Secondary | ICD-10-CM

## 2013-02-27 MED ORDER — SENNOSIDES-DOCUSATE SODIUM 8.6-50 MG PO TABS
2.0000 | ORAL_TABLET | Freq: Every day | ORAL | Status: DC
  Administered 2013-02-27 – 2013-03-19 (×21): 2 via ORAL
  Filled 2013-02-27 (×20): qty 2
  Filled 2013-02-27: qty 1
  Filled 2013-02-27 (×5): qty 2

## 2013-02-27 MED ORDER — BISACODYL 10 MG RE SUPP
10.0000 mg | Freq: Every day | RECTAL | Status: DC | PRN
  Administered 2013-02-27 – 2013-03-02 (×2): 10 mg via RECTAL
  Filled 2013-02-27 (×2): qty 1

## 2013-02-27 NOTE — Progress Notes (Signed)
Physical Therapy Weekly Progress Note  Patient Details  Name: [name] MRN: [id number] Date of Birth: 1975-12-29  Today's Date: 02/27/2013 Time: 1100-1200 Time Calculation (min): 60 min  Patient has met 1 of 3 short term goals.  Patient with increased participation. She continues to demonstrate poor awareness and is unrealistic regarding outcomes. Patient asked her mom to assist her with standing and walking (Pt is currently +2 assist). Patient wanted to try ambulating with rolling walker - did not understand that left UE can not hold onto RW.   Patient continues to demonstrate the following deficits: left hemiplegia, left inattention, poor awareness, poor sitting balance/pushing to left and therefore will continue to benefit from skilled PT intervention to enhance overall performance with activity tolerance, balance, postural control, functional use of  left upper extremity and left lower extremity, attention and awareness. Patient still with trace movement in left LE.  Patient progressing toward long term goals..  Continue plan of care.  PT Short Term Goals Week 1:  PT Short Term Goal 1 (Week 1): Patient will perform bed mobility with min A,including rolling R and L and supine to sit with min A PT Short Term Goal 1 - Progress (Week 1): Progressing toward goal PT Short Term Goal 2 (Week 1): Sitting EOB with min A and decreased LOB to the L. PT Short Term Goal 2 - Progress (Week 1): Met PT Short Term Goal 3 (Week 1): Patient will perform transfers with mod A from bed to w/c . PT Short Term Goal 3 - Progress (Week 1): Progressing toward goal Week 2:  PT Short Term Goal 1 (Week 2): Patient will perform bed mobility with min A,including rolling R and L and supine to sit with min A PT Short Term Goal 2 (Week 2): Patient will perform transfers with mod A from bed to w/c . PT Short Term Goal 3 (Week 2): Patient will propell wheelchair 200 feet with supervision and occasional cueing. PT Short  Term Goal 4 (Week 2): patient will sit edge of bed or mat with supervision for 3 minutes  Skilled Therapeutic Interventions/Progress Updates:  Treatment session focused on ambulation per patient request. Patient requested using rolling walker. Attempted to explain to patient that left LE is not able to hold onto walker and to try hemi walker. Patient insisted so attempted with rolling walker and platform attachment on left. Patient ambulated 60 feet x 3 with +2 assist. Patient requires max assist for swing on left and either buckles or locks knee in extension during stance. Patient is showing improvement with hip extension during stance on left. Patient also requires assist of second person to assist with guiding walker and weight shifting trunk to right. Patient propelled wheelchair 150 feet and 100 feet using hemi technique and with minimal cueing to avoid objects. Patient left in wheelchair with quick release belt in place and lap tray on left. Mother present in room also.   Therapy Documentation Precautions:  Precautions Precautions: Fall Precaution Comments: lethargic, leans to L; delayed processing Restrictions Weight Bearing Restrictions: No Pain: Pain Assessment Pain Assessment: No/denies pain Pain Score:   7 Pain Type: Acute pain Pain Location: Head Pain Orientation: Left Pain Descriptors: Headache Pain Frequency: Intermittent Pain Onset: Gradual Patients Stated Pain Goal: 3 Pain Intervention(s): Medication (See eMAR);Emotional support Multiple Pain Sites: No Locomotion : Ambulation Ambulation/Gait Assistance: 1: +2 Total assist Wheelchair Mobility Distance: 150   See FIM for current functional status  Therapy/Group: Individual Therapy  [name]  02/27/2013, 12:12 PM

## 2013-02-27 NOTE — Progress Notes (Signed)
Physical Therapy Session Note  Patient Details  Name: [name] MRN: [id number] Date of Birth: 1976-03-19  Today's Date: 02/27/2013 Time: 1430-1500 Time Calculation (min): 30 min  Short Term Goals: Week 1:  PT Short Term Goal 1 (Week 1): Patient will perform bed mobility with min A,including rolling R and L and supine to sit with min A PT Short Term Goal 1 - Progress (Week 1): Progressing toward goal PT Short Term Goal 2 (Week 1): Sitting EOB with min A and decreased LOB to the L. PT Short Term Goal 2 - Progress (Week 1): Met PT Short Term Goal 3 (Week 1): Patient will perform transfers with mod A from bed to w/c . PT Short Term Goal 3 - Progress (Week 1): Progressing toward goal Week 2:  PT Short Term Goal 1 (Week 2): Patient will perform bed mobility with min A,including rolling R and L and supine to sit with min A PT Short Term Goal 2 (Week 2): Patient will perform transfers with mod A from bed to w/c . PT Short Term Goal 3 (Week 2): Patient will propell wheelchair 200 feet with supervision and occasional cueing. PT Short Term Goal 4 (Week 2): patient will sit edge of bed or mat with supervision for 3 minutes  Therapy Documentation Precautions:  Precautions Precautions: Fall Precaution Comments: lethargic, leans to L; delayed processing Restrictions Weight Bearing Restrictions: No Vital Signs: Therapy Vitals Temp: 98.3 F (36.8 C) Temp src: Oral Pulse Rate: 86 Resp: 18 BP: 126/78 mmHg Patient Position, if appropriate: Sitting Oxygen Therapy SpO2: 99 % O2 Device: None (Room air) Pain: Pain Assessment Pain Assessment: No/denies pain Mobility:  Patient performed squat pivots to L and R with mod-max A overall with verbal cues for anterior lean and initiation but patient able to elevate hips and perform full pivot.  Performed lateral scooting on bed with mod A and sit > supine with min A.  Performed rolling to R side with mod A and verbal cues for initiation and  sequencing; assistance also needed for LLE flexion.   Locomotion : Wheelchair Mobility Distance: 150' in controlled environment with R hemi technique with supervision overall and intermittent cues to attend to L environment and obstacle negotiation.   Other Treatments: Treatments Neuromuscular Facilitation: Left;Lower Extremity;Activity to increase coordination;Activity to increase motor control;Activity to increase timing and sequencing;Activity to increase sustained activation in R sidelying with LLE on powder board with focus on gravity minimized activation of LLE ankle DF/PF, knee flexion <> extension, hip ABD/ER and hip extension/flexion.  Patient demonstrating sustained activation of hip flexion and extension through partial range and toe extension.  Will continue to address.  No activation noted in quads or hamstrings for knee flexion <> extension.  See FIM for current functional status  Therapy/Group: Individual Therapy  [name] [facility] 02/27/2013, 3:58 PM

## 2013-02-27 NOTE — Progress Notes (Signed)
Patient ID: [name], female   DOB: 08/30/1976, 37 y.o.   MRN: [id number] Subjective/Complaints: 36y.o. 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 Admitted 02/16/2013 with complaints of headache and left-sided weakness with altered mental status. INR on admission of 1.14 with noted poor compliance on Coumadin. MRI of the brain showed acute infarct right MCA territory without hemorrhage as well as scattered small areas of acute infarct left frontal and parietal lobe. Patient did not receive TPA. Echocardiogram with ejection fraction of 60% without embolism. 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 There was a filling defect involving the innominate artery thought likely to represent thrombus. There was also occlusion of the right cavernous carotid and right M1 segment indicate of embolic phenomenon. Underwent ICA thrombectomy 02/16/2013 per interventional radiology. Patient had been placed on aspirin therapy per neurology services and Coumadin later resumed of which patient adamantly refused and patient was placed thus patient was placed on Xarelto for stroke prevention 02/20/2013  Constipated.  Uses dulcolax supp at home.  Review of Systems  Neurological: Positive for dizziness and focal weakness.  All other systems reviewed and are negative.    Objective: Vital Signs: Blood pressure 128/73, pulse 75, temperature 98.1 F (36.7 C), temperature source Oral, resp. rate 16, weight 65.5 kg (144 lb 6.4 oz), last menstrual period 02/15/2013, SpO2 99.00%. No results found. No results found for this or any previous visit (from the past 72 hour(s)).   HEENT: normal Gen:  NAD Heart regular rate and rhythm no rubs murmurs or exercise Abdomen positive bowel sounds soft nontender  palpation Extremities no clubbing cyanosis or edema Musculoskeletal no shoulder pain with range of motion left side. Motor strength 5/5 in the right deltoid, biceps, triceps, grip, hip flexor, knee extensors, ankle dorsiflexor plantar flexor 2 minus/5 in the left deltoid, biceps, triceps, grip 3 minus in the hip flexor knee extensor ankle dorsiflexor and flexor left Sensation is able to identify light touch in the left upper and left lower extremity as well as on the right side. Evidence of ataxia finger nose to finger testing on the right side Unable to do finger-nose-finger testing on the left side secondary to weakness Unable to cooperate with visual field testing +aprosodia  Assessment/Plan: 1. Functional deficits secondary to R MCA infarct which require 3+ hours per day of interdisciplinary therapy in a comprehensive inpatient rehab setting. Physiatrist is providing close team supervision and 24 hour management of active medical problems listed below. Physiatrist and rehab team continue to assess barriers to discharge/monitor patient progress toward functional and medical goals. FIM: FIM - Bathing Bathing Steps Patient Completed: Chest;Left Arm;Abdomen;Front perineal area;Right upper leg;Left upper leg;Right lower leg (including foot) Bathing: 3: Mod-Patient completes 5-7 58f 10 parts or 50-74%  FIM - Upper Body Dressing/Undressing Upper body dressing/undressing steps patient completed: Thread/unthread right sleeve of pullover shirt/dresss;Put head through opening of pull over shirt/dress;Pull shirt over trunk Upper body dressing/undressing: 4: Min-Patient completed 75 plus % of tasks FIM - Lower Body Dressing/Undressing Lower body dressing/undressing steps patient completed: Thread/unthread right pants leg;Thread/unthread right underwear leg Lower body dressing/undressing: 2: Max-Patient completed 25-49% of tasks  FIM - Toileting Toileting steps completed by patient: Performs  perineal hygiene Toileting: 0: Activity did not occur  FIM - Diplomatic Services operational officer Devices: Psychiatrist Transfers: 1-Two  helpers  FIM - Banker Devices: Arm rests;Bed rails Bed/Chair Transfer: 3: Bed > Chair or W/C: Mod A (lift or lower assist);3: Chair or W/C > Bed: Mod A (lift or lower assist)  FIM - Locomotion: Wheelchair Distance: 100 Locomotion: Wheelchair: 2: Travels 50 - 149 ft with moderate assistance (Pt: 50 - 74%) FIM - Locomotion: Ambulation Locomotion: Ambulation Assistive Devices: Other (comment) (railing in hall) Ambulation/Gait Assistance: 1: +1 Total assist Locomotion: Ambulation: 1: Travels less than 50 ft with total assistance/helper does all (Pt.<25%)  Comprehension Comprehension Mode: Auditory Comprehension: 3-Understands basic 50 - 74% of the time/requires cueing 25 - 50%  of the time  Expression Expression Mode: Verbal Expression: 3-Expresses basic 50 - 74% of the time/requires cueing 25 - 50% of the time. Needs to repeat parts of sentences.  Social Interaction Social Interaction: 2-Interacts appropriately 25 - 49% of time - Needs frequent redirection.  Problem Solving Problem Solving: 2-Solves basic 25 - 49% of the time - needs direction more than half the time to initiate, plan or complete simple activities  Memory Memory: 2-Recognizes or recalls 25 - 49% of the time/requires cueing 51 - 75% of the time  Medical Problem List and Plan:  1. Thromboembolic right MCA infarct laeft hemiparesis, combination of Takayasu's arteritis and hyper coagulable state 2. DVT Prophylaxis/Anticoagulation: IVC filter/ Xarelto.Lupus anticoagulant antibody positive , non compliant with WarfarinMonitor for any signs of bleeding  3. Neuropsych: This patient is not capable of making decisions on his/her own behalf.  -will need constant positive reinforcement and education  -mother appears quite positive  and supportive however  4. History of lupus anticoag Pos, no other clinical signs of SLE Continue prednisone for Takayasu's arteritis, CBGs look ok this will be a chronic med, may be able to reduce dose 5. MRSA positive nasal swab. Contact precautions  6. History of medical noncompliance. Counseling  7. Microcytic anemia. Latest hemoglobin 8.6. Followup CBC 8.  Chronic constipation add senna S  LOS (Days) 6 A FACE TO FACE EVALUATION WAS PERFORMED  [name] 02/27/2013, 8:31 AM

## 2013-02-27 NOTE — Progress Notes (Signed)
Speech Language Pathology Daily Session Note  Patient Details  Name: [name] MRN: [id number] Date of Birth: 18-Jun-1976  Today's Date: 02/27/2013 Time: 1300-1400 Time Calculation (min): 60 min  Short Term Goals: Week 1: SLP Short Term Goal 1 (Week 1): Patient will initiate verbal communication with communication partner in response to questions with mod assist clinician cues.  SLP Short Term Goal 2 (Week 1): Patient will attend to left visual field during a functional ADL with moderate clinician cues.  SLP Short Term Goal 3 (Week 1): Patient will sustain attention to basic functional ADL for 15 minutes with moderate clinician cues.  SLP Short Term Goal 4 (Week 1): Patient will demonstrate intellectual awareness of deficits post CVA by naming 3 changes with moderate questions cues.  SLP Short Term Goal 5 (Week 1): Patient will recall and retuen use of call bell to request assistance with mod assist question cues.   Skilled Therapeutic Interventions: Skilled treatment session focused on addressing cognitive goals.  SLP facilitated session with varius tasks to address sustatined attention.  Patient with increased awareness of deficits today saying that these things you are asking me to do are simple and I cannot do them.  She was also more willing to participate in tasks even if they were not her preference and initiated tasks with less cuing.  Patietn was able to siustain attention to tasks for max of 1 minute but frequently was only able to attend for 30 seconds with max assist multi-modal clinician cues; internal distractions appeared to impact performance.     FIM:  Comprehension Comprehension Mode: Auditory Comprehension: 4-Understands basic 75 - 89% of the time/requires cueing 10 - 24% of the time Expression Expression Mode: Verbal Expression: 3-Expresses basic 50 - 74% of the time/requires cueing 25 - 50% of the time. Needs to repeat parts of sentences. Social Interaction Social  Interaction: 3-Interacts appropriately 50 - 74% of the time - May be physically or verbally inappropriate. Problem Solving Problem Solving: 3-Solves basic 50 - 74% of the time/requires cueing 25 - 49% of the time Memory Memory: 3-Recognizes or recalls 50 - 74% of the time/requires cueing 25 - 49% of the time  Pain Pain Assessment Pain Assessment: No/denies pain  Therapy/Group: Individual Therapy  Charlane Ferretti., CCC-SLP [phone]  [name] 02/27/2013, 3:21 PM

## 2013-02-27 NOTE — Plan of Care (Signed)
Problem: RH BOWEL ELIMINATION Goal: RH STG MANAGE BOWEL WITH ASSISTANCE STG Manage Bowel with MOD I Assistance.  Outcome: Progressing LBM 02/27/13 Goal: RH STG MANAGE BOWEL W/MEDICATION W/ASSISTANCE STG Manage Bowel with Medication with MOD I Assistance.  Outcome: Progressing Ducolax supp given, resulted BM today

## 2013-02-27 NOTE — Progress Notes (Signed)
Occupational Therapy Session Note  Patient Details  Name: [name] MRN: [id number] Date of Birth: 01/30/1976  Today's Date: 02/27/2013 Time: 0800-0900 Time Calculation (min): 60 min  Short Term Goals: Week 1:  OT Short Term Goal 1 (Week 1): Pt.  will maintain midline control for 5 minutes during grooming activity OT Short Term Goal 2 (Week 1): Pt. will perform grooming with mod assist OT Short Term Goal 3 (Week 1): Pt. will perform UB bathing with minimal assist and max with portural control OT Short Term Goal 4 (Week 1): Pt. will bathe LB with mod assist and max assist with sitting balance OT Short Term Goal 5 (Week 1): Pt. will dress UB with mod assist  Skilled Therapeutic Interventions/Progress Updates:    Pt seen for ADL retraining of dressing from bed level with a focus on bed mobility, sitting balance, left side awareness, motor planning, and attention to task.  Pt opted not to bathe this morning as she stated she bathed last night.  To don pants pt remained in supine and worked on bridging with mod assist and rolling with min assist.  Pt needs max cues to keep right leg bent (the functional leg) as she becomes distracted easily.  Sat to EOB with min-mod assist and maintained static balance with close supervision and min assist for balance with UB dressing. Pt taken to gym for NMES to wrist extensors for 7 min at intensity level 20.  Pt was comfortable with estim, but did not demonstrate any carry over.  She was returned to room in w/c with safety belt and call light in place.  Therapy Documentation Precautions:  Precautions Precautions: Fall Precaution Comments: lethargic, leans to L; delayed processing Restrictions Weight Bearing Restrictions: No    Pain: Pain Assessment Pain Assessment: 0-10 Pain Score:   7 Pain Type: Acute pain Pain Location: Head Pain Orientation: Left Pain Descriptors: Headache Pain Frequency: Intermittent Pain Onset: Gradual Patients Stated Pain  Goal: 3 Pain Intervention(s): Medication (See eMAR);Emotional support Multiple Pain Sites: No ADL: See FIM for current functional status  Therapy/Group: Individual Therapy  Sundance Moise 02/27/2013, 11:24 AM

## 2013-02-28 ENCOUNTER — Inpatient Hospital Stay (HOSPITAL_COMMUNITY): Payer: Medicare Other | Admitting: Physical Therapy

## 2013-02-28 LAB — CBC
Platelets: 473 10*3/uL — ABNORMAL HIGH (ref 150–400)
RBC: 3.77 MIL/uL — ABNORMAL LOW (ref 3.87–5.11)
RDW: 19.5 % — ABNORMAL HIGH (ref 11.5–15.5)
WBC: 15.6 10*3/uL — ABNORMAL HIGH (ref 4.0–10.5)

## 2013-02-28 NOTE — Consult Note (Signed)
NEUROCOGNITIVE TESTING - [facility]   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  She was seen for a brief neuropsychological assessment to evaluate cognitive and emotional functioning post-stroke.  According to her medical record, she was admitted on 02/16/13 with complaints of headache and left-sided weakness with altered mental status.  MRI of the brain demonstrated acute right MCA territory infarct without hemorrhage as well as scattered small areas of acute infarct in the left frontal and parietal lobes.  She underwent ICA thrombectomy on 02/16/13 per interventional radiology.    PROCEDURES: [3 units of 16109 on 02/26/2013]  The following tests were performed during today's visit: Repeatable Battery for the Assessment of Neuropsychological Status (RBANS, form A, Beck Depression Inventory (fast screen for medical patients).  Performance validity measures were also administered.  Test results are as follows:   RBANS Indices Scaled Score Percentile Description  Immediate Memory  57 < 1 Profoundly Impaired  Visuospatial/Constructional 60 < 1 Profoundly Impaired  Language 47 < 1 Profoundly Impaired  Attention 64 1 Profoundly Impaired  Delayed Memory 74 4 Impaired  Total Score 52 < 1 Profoundly Impaired   RBANS Subtests Raw Score Percentile Description  List Learning 16 < 1 Profoundly Impaired  Story Memory 11 1 Profoundly Impaired  Figure Copy 12 < 1 Profoundly Impaired  Line Orientation 10 1 Profoundly Impaired  Picture Naming 7 < 1 Profoundly Impaired  Semantic Fluency 7 < 1 Profoundly Impaired  Digit Span 10 25 Average  Coding 21 < 1 Profoundly Impaired  List Recall 3 1 Profoundly Impaired  List Recognition 19 13 Below Average  Story Recall 5 1 Profoundly Impaired  Figure recall 4 < 1 Profoundly  Impaired   BDI-fast Raw Score = 0 Description = WNL   Test results revealed significant impairment across cognitive domains.  Her performances on embedded and objective measures of performance validity were mixed.  Behaviorally, [name] had flat affect throughout testing.  Her facial expression appeared defiant, though [name] was cooperative with all measures and when questioned about her mood, she denied feeling angry and repeatedly demonstrated that she was trying to complete measures.  Her attention span was very short and she would frequently interrupt the task-at-hand to ask an unrelated question or do something that was unrelated to the current activity.  For example, she started writing her name over and over during one task.  Another time, she was staring away from the stimulus during a task requiring her to count dots on a page, but when questioned, she stated that she was trying to group the dots and multiply them and was having trouble figuring out "2 times 3."  As such, it appeared that she was internally distracted and had poor problem-solving as she was not able to re-direct herself to complete the desired tasks.  She responded well to re-direction, but only temporarily, as she would again lose focus quickly.  Although most of her scores were impaired, she demonstrated intact repetition ability and below average recognition for studied verbal information.    At this time, there does not appear to be significant mood disruption that is impacting her cognitive functioning.  However, she subjectively noted that it is upsetting when team members wake her up in the morning for no particular reason.  She stated that she is used to sleeping in until 10 or  11AM and would like the option to do so if she does not have to wake for therapy.    IMPRESSIONS:  [name] overall cognitive functioning was impaired across domains and it appears that her attention span has been particularly impacted, which is  affecting her ability to successfully complete other tasks.  Although her facial expression appears defiant and angry at times, I suspect that this represents flat affect secondary to stroke, as opposed to being congruent with her mood.  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  Given ample time, it is possible that she may cognitively recover enough to complete her courses, but she should strongly consider taking a leave of absence until her brain has time to heal.    In light of these findings, the following recommendations are provided.    RECOMMENDATIONS:  Recommendations for treatment team:     [name] affect could make it seem like she is not trying in therapy or is angry at the therapists.  Team members should be aware that her facial expression is likely a manifestation of flat affect from her stroke and does not necessarily represent her mood.  If they are curious, they could ask her how she is feeling, as she seems to be willing to respond honestly.      [name] reported feeling upset when she has to wake up early, particularly if she is not being woken for a specific therapy.  As such, it may be beneficial to schedule her therapies later in the day to let her sleep longer, if possible and to avoid waking her until it is time for her to get ready for a specific therapy session.      [name] attention span was very short.  As such, when interacting with [name], directions and information should be provided in a simple, straightforward manner, and the treatment team should avoid giving multiple instructions simultaneously.      The treatment team should be prepared to repeat themselves frequently and provide significant re-direction.      To the extent possible, multitasking should be avoided.    Performance will  generally be best in a structured, routine, and familiar environment, as opposed to situations involving complex problems.     If the treatment team notices dramatic improvement in cognitive functioning, repeat neuropsychological testing could be conducted prior to her discharge to assess for interval change.    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      When she chooses to return to school, accommodations for attention deficit should be provided, including: extra time to complete exams and copies of professors' notes or power point presentations when possible.      Owing to visuospatial, attentional, and processing speed deficits, [name] should refrain from driving at this time.      Maintain engagement in mentally, physically and cognitively stimulating activities.     Strive to maintain a healthy lifestyle (e.g., proper diet and exercise) in order to promote physical, cognitive and emotional health.   Leavy Cella, Psy.D.  Clinical Neuropsychologist

## 2013-02-28 NOTE — Progress Notes (Signed)
Patient ID: [name], female   DOB: June 18, 1976, 37 y.o.   MRN: [id number] Subjective/Complaints: 37y.o. 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 Admitted 02/16/2013 with complaints of headache and left-sided weakness with altered mental status. INR on admission of 1.14 with noted poor compliance on Coumadin. MRI of the brain showed acute infarct right MCA territory without hemorrhage as well as scattered small areas of acute infarct left frontal and parietal lobe. Patient did not receive TPA. Echocardiogram with ejection fraction of 60% without embolism. 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 There was a filling defect involving the innominate artery thought likely to represent thrombus. There was also occlusion of the right cavernous carotid and right M1 segment indicate of embolic phenomenon. Underwent ICA thrombectomy 02/16/2013 per interventional radiology. Patient had been placed on aspirin therapy per neurology services and Coumadin later resumed of which patient adamantly refused and patient was placed thus patient was placed on Xarelto for stroke prevention 02/20/2013  "did you do my surgery?"  Review of Systems  Neurological: Positive for dizziness and focal weakness.  All other systems reviewed and are negative.    Objective: Vital Signs: Blood pressure 129/80, pulse 86, temperature 98.1 F (36.7 C), temperature source Oral, resp. rate 20, weight 65.5 kg (144 lb 6.4 oz), last menstrual period 02/15/2013, SpO2 99.00%. No results found. No results found for this or any previous visit (from the past 72 hour(s)).   HEENT: normal Gen:  NAD Heart regular rate and rhythm no rubs murmurs or exercise Abdomen positive bowel sounds soft nontender  palpation Extremities no clubbing cyanosis or edema Musculoskeletal no shoulder pain with range of motion left side. Motor strength 5/5 in the right deltoid, biceps, triceps, grip, hip flexor, knee extensors, ankle dorsiflexor plantar flexor 0 in the left deltoid, biceps, triceps, grip in the hip flexor knee extensor ankle dorsiflexor and flexor left Sensation is able to identify light touch in the left upper and left lower extremity as well as on the right side. Evidence of ataxia finger nose to finger testing on the right side Unable to do finger-nose-finger testing on the left side secondary to weakness Unable to cooperate with visual field testing +aprosodia  Assessment/Plan: 1. Functional deficits secondary to R MCA infarct which require 3+ hours per day of interdisciplinary therapy in a comprehensive inpatient rehab setting. Physiatrist is providing close team supervision and 24 hour management of active medical problems listed below. Physiatrist and rehab team continue to assess barriers to discharge/monitor patient progress toward functional and medical goals. FIM: FIM - Bathing Bathing Steps Patient Completed: Chest;Left Arm;Abdomen;Front perineal area;Right upper leg;Left upper leg;Right lower leg (including foot) Bathing: 3: Mod-Patient completes 5-7 27f 10 parts or 50-74%  FIM - Upper Body Dressing/Undressing Upper body dressing/undressing steps patient completed: Thread/unthread right sleeve of pullover shirt/dresss;Put head through opening of pull over shirt/dress;Pull shirt over trunk Upper body dressing/undressing: 4: Min-Patient completed 75 plus % of tasks FIM - Lower Body Dressing/Undressing Lower body dressing/undressing steps patient completed: Thread/unthread right pants leg;Thread/unthread right underwear leg Lower body dressing/undressing: 2: Max-Patient completed 25-49% of tasks  FIM - Toileting Toileting steps completed by patient: Performs perineal  hygiene Toileting: 0: Activity did not occur  FIM - Diplomatic Services operational officer Devices: Psychiatrist Transfers: 1-Two helpers  FIM -  Bed/Chair Transport planner Devices: Arm rests;Bed rails Bed/Chair Transfer: 3: Supine > Sit: Mod A (lifting assist/Pt. 50-74%/lift 2 legs;3: Bed > Chair or W/C: Mod A (lift or lower assist)  FIM - Locomotion: Wheelchair Distance: 150 Locomotion: Wheelchair: 4: Travels 150 ft or more: maneuvers on rugs and over door sillls with minimal assistance (Pt.>75%) FIM - Locomotion: Ambulation Locomotion: Ambulation Assistive Devices: Programmer, systems - Platform Ambulation/Gait Assistance: 1: +2 Total assist Locomotion: Ambulation: 1: Two helpers  Comprehension Comprehension Mode: Auditory Comprehension: 4-Understands basic 75 - 89% of the time/requires cueing 10 - 24% of the time  Expression Expression Mode: Verbal Expression: 3-Expresses basic 50 - 74% of the time/requires cueing 25 - 50% of the time. Needs to repeat parts of sentences.  Social Interaction Social Interaction: 3-Interacts appropriately 50 - 74% of the time - May be physically or verbally inappropriate.  Problem Solving Problem Solving: 3-Solves basic 50 - 74% of the time/requires cueing 25 - 49% of the time  Memory Memory: 3-Recognizes or recalls 50 - 74% of the time/requires cueing 25 - 49% of the time  Medical Problem List and Plan:  1. Thromboembolic right MCA infarct laeft hemiparesis, combination of Takayasu's arteritis and hyper coagulable state 2. DVT Prophylaxis/Anticoagulation: IVC filter/ Xarelto.Lupus anticoagulant antibody positive , non compliant with WarfarinMonitor for any signs of bleeding  3. Neuropsych: This patient is not capable of making decisions on his/her own behalf.  -will need constant positive reinforcement and education  -mother appears quite positive and supportive however  4. History of lupus anticoag  Pos, no other clinical signs of SLE Continue prednisone for Takayasu's arteritis, CBGs look ok this will be a chronic med, may be able to reduce dose 5. MRSA positive nasal swab. Contact precautions  6. History of medical noncompliance. Counseling  7. Microcytic anemia. Latest hemoglobin 8.6. Followup CBC 8.  Chronic constipation add senna S  LOS (Days) 7 A FACE TO FACE EVALUATION WAS PERFORMED  [name] 02/28/2013, 10:02 AM

## 2013-02-28 NOTE — Progress Notes (Signed)
Physical Therapy Session Note  Patient Details  Name: [name] MRN: [id number] Date of Birth: [birth year]  Today's Date: 02/28/2013 Time: 4098-1191 Time Calculation (min): 44 min  Short Term Goals: Week 1:  PT Short Term Goal 1 (Week 1): Patient will perform bed mobility with min A,including rolling R and L and supine to sit with min A PT Short Term Goal 1 - Progress (Week 1): Progressing toward goal PT Short Term Goal 2 (Week 1): Sitting EOB with min A and decreased LOB to the L. PT Short Term Goal 2 - Progress (Week 1): Met PT Short Term Goal 3 (Week 1): Patient will perform transfers with mod A from bed to w/c . PT Short Term Goal 3 - Progress (Week 1): Progressing toward goal Week 2:  PT Short Term Goal 1 (Week 2): Patient will perform bed mobility with min A,including rolling R and L and supine to sit with min A PT Short Term Goal 2 (Week 2): Patient will perform transfers with mod A from bed to w/c . PT Short Term Goal 3 (Week 2): Patient will propell wheelchair 200 feet with supervision and occasional cueing. PT Short Term Goal 4 (Week 2): patient will sit edge of bed or mat with supervision for 3 minutes   Therapy Documentation Precautions:  Precautions Precautions: Fall Precaution Comments: lethargic, leans to L; delayed processing Restrictions Weight Bearing Restrictions: No Pain: Pain Assessment Pain Assessment: No/denies pain Mobility:  Patient still in bed; assisted patient with donning of pants with patient verbalizing sequence; patient performed rolling in bed to R side with min-mod A with verbal cues to attend to LUE and assistance for LLE flexion prior to rolling; side > sit with min-mod A to sit fully upright EOB and to stabilize secondary to intermittent L lateral and posterior LOB.  Patient performed one sit > stand from EOB with RUE support and mod A to maintain LLE extension while pulling up pants.  Performed transfer bed > w/c to L with squat pivot and mod A  for lifting but patient able to complete full pivot. Performed w/c mobility x 150' in controlled environment with RLE propulsion and supervision.   Other Treatments: Treatments Neuromuscular Facilitation: Left;Upper Extremity;Lower Extremity;Forced use;Activity to increase timing and sequencing;Activity to increase sustained activation;Activity to increase lateral weight shifting;Activity to increase anterior-posterior weight shifting during prolonged standing at tall table with LUE supported in WB position on table and mod-max A to maintain LLE extension and verbal cues for activation of LLE hip extension and cues for upright trunk and L pelvic anterior rotation for stability during RUE performing reaching task at tall table during checkers game; also utilized checkers game for sustained attention to task, sequencing and problem solving.  Patient continue to utilize trunk flexion or genu recurvatum to achieve LLE extension and stability.    See FIM for current functional status  Therapy/Group: Individual Therapy  [name] [facility] 02/28/2013, 12:02 PM

## 2013-03-01 ENCOUNTER — Inpatient Hospital Stay (HOSPITAL_COMMUNITY): Payer: Medicare Other | Admitting: *Deleted

## 2013-03-01 NOTE — Progress Notes (Signed)
Patient ID: [name], female   DOB: 04-Jul-1976, 37 y.o.   MRN: [id number] Subjective/Complaints: 36y.o. 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 Admitted 02/16/2013 with complaints of headache and left-sided weakness with altered mental status. INR on admission of 1.14 with noted poor compliance on Coumadin. MRI of the brain showed acute infarct right MCA territory without hemorrhage as well as scattered small areas of acute infarct left frontal and parietal lobe. Patient did not receive TPA. Echocardiogram with ejection fraction of 60% without embolism. 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 There was a filling defect involving the innominate artery thought likely to represent thrombus. There was also occlusion of the right cavernous carotid and right M1 segment indicate of embolic phenomenon. Underwent ICA thrombectomy 02/16/2013 per interventional radiology. Patient had been placed on aspirin therapy per neurology services and Coumadin later resumed of which patient adamantly refused and patient was placed thus patient was placed on Xarelto for stroke prevention 02/20/2013  Constipated, problems at home as well. Used citrucel and supp  Per RN pt had good BM yest but not charted and pt doesn't remember Review of Systems  Neurological: Positive for dizziness and focal weakness.  All other systems reviewed and are negative.    Objective: Vital Signs: Blood pressure 119/54, pulse 66, temperature 98.1 F (36.7 C), temperature source Oral, resp. rate 17, weight 65.5 kg (144 lb 6.4 oz), last menstrual period 02/15/2013, SpO2 99.00%. No results found. Results for orders placed during the hospital encounter of 02/21/13 (from the past 72 hour(s))  CBC     Status: Abnormal    Collection Time    02/28/13  4:20 PM      Result Value Range   WBC 15.6 (*) 4.0 - 10.5 K/uL   RBC 3.77 (*) 3.87 - 5.11 MIL/uL   Hemoglobin 9.4 (*) 12.0 - 15.0 g/dL   HCT 47.8 (*) 29.5 - 62.1 %   MCV 77.7 (*) 78.0 - 100.0 fL   MCH 24.9 (*) 26.0 - 34.0 pg   MCHC 32.1  30.0 - 36.0 g/dL   RDW 30.8 (*) 65.7 - 84.6 %   Platelets 473 (*) 150 - 400 K/uL     HEENT: normal Gen:  NAD Heart regular rate and rhythm no rubs murmurs or exercise Abdomen positive bowel sounds soft nontender palpation Extremities no clubbing cyanosis or edema Musculoskeletal no shoulder pain with range of motion left side. Motor strength 5/5 in the right deltoid, biceps, triceps, grip, hip flexor, knee extensors, ankle dorsiflexor plantar flexor 0 in the left deltoid, biceps, triceps, grip in the hip flexor knee extensor ankle dorsiflexor and flexor left Sensation is able to identify light touch in the left upper and left lower extremity as well as on the right side. Evidence of ataxia finger nose to finger testing on the right side Unable to do finger-nose-finger testing on the left side secondary to weakness Unable to cooperate with visual field testing +aprosodia  Assessment/Plan: 1. Functional deficits secondary to R MCA infarct which require 3+ hours per day of interdisciplinary therapy in a comprehensive inpatient rehab setting. Physiatrist is providing close team supervision and 24 hour management of active medical problems listed below. Physiatrist and rehab team continue to assess barriers to discharge/monitor patient progress toward functional and medical goals. FIM: FIM -  Bathing Bathing Steps Patient Completed: Chest;Left Arm;Abdomen;Front perineal area;Right upper leg;Left upper leg;Right lower leg (including foot) Bathing: 3: Mod-Patient completes 5-7 42f 10 parts or 50-74%  FIM - Upper Body Dressing/Undressing Upper body dressing/undressing steps patient completed: Thread/unthread right  sleeve of pullover shirt/dresss;Put head through opening of pull over shirt/dress;Pull shirt over trunk Upper body dressing/undressing: 4: Min-Patient completed 75 plus % of tasks FIM - Lower Body Dressing/Undressing Lower body dressing/undressing steps patient completed: Thread/unthread right pants leg;Thread/unthread right underwear leg Lower body dressing/undressing: 2: Max-Patient completed 25-49% of tasks  FIM - Toileting Toileting steps completed by patient: Performs perineal hygiene Toileting: 0: Activity did not occur  FIM - Diplomatic Services operational officer Devices: Psychiatrist Transfers: 1-Two helpers  FIM - Banker Devices: Bed rails;Arm rests Bed/Chair Transfer: 3: Supine > Sit: Mod A (lifting assist/Pt. 50-74%/lift 2 legs;3: Bed > Chair or W/C: Mod A (lift or lower assist);3: Chair or W/C > Bed: Mod A (lift or lower assist)  FIM - Locomotion: Wheelchair Distance: 150 Locomotion: Wheelchair: 5: Travels 150 ft or more: maneuvers on rugs and over door sills with supervision, cueing or coaxing FIM - Locomotion: Ambulation Locomotion: Ambulation Assistive Devices: Programmer, systems - Platform Ambulation/Gait Assistance: 1: +2 Total assist Locomotion: Ambulation: 0: Activity did not occur  Comprehension Comprehension Mode: Auditory Comprehension: 4-Understands basic 75 - 89% of the time/requires cueing 10 - 24% of the time  Expression Expression Mode: Verbal Expression: 3-Expresses basic 50 - 74% of the time/requires cueing 25 - 50% of the time. Needs to repeat parts of sentences.  Social Interaction Social Interaction: 3-Interacts appropriately 50 - 74% of the time - May be physically or verbally inappropriate.  Problem Solving Problem Solving: 3-Solves basic 50 - 74% of the time/requires cueing 25 - 49% of the time  Memory Memory: 3-Recognizes or recalls 50 - 74% of the time/requires cueing 25 - 49%  of the time  Medical Problem List and Plan:  1. Thromboembolic right MCA infarct laeft hemiparesis, combination of Takayasu's arteritis and hyper coagulable state 2. DVT Prophylaxis/Anticoagulation: IVC filter/ Xarelto.Lupus anticoagulant antibody positive , non compliant with WarfarinMonitor for any signs of bleeding  3. Neuropsych: This patient is not capable of making decisions on his/her own behalf.  -will need constant positive reinforcement and education  -mother appears quite positive and supportive however  4. History of lupus anticoag Pos, no other clinical signs of SLE Continue prednisone for Takayasu's arteritis, CBGs look ok this will be a chronic med, may be able to reduce dose 5. MRSA positive nasal swab. Contact precautions  6. History of medical noncompliance. Counseling  7. Microcytic anemia. Latest hemoglobin 8.6. Followup CBC 8.  Chronic constipation add senna S  LOS (Days) 8 A FACE TO FACE EVALUATION WAS PERFORMED  KIRSTEINS,ANDREW E 03/01/2013, 9:29 AM

## 2013-03-01 NOTE — Progress Notes (Signed)
Physical Therapy Session Note  Patient Details  Name: [name] MRN: [id number] Date of Birth: Jun 25, 1976  Today's Date: 03/01/2013 Time: 4098-1191 Time Calculation (min): 38 min  Patient reported feeling discomfort in her abdomen ,stated she is not going to do anything ,after education and convincing patient agreed to participate in therapy. Transfer from w/c to bed with mod A,patient impulsive, poor safety. In supine rolling R and L with use of side rails with min /modA,patient requires increased reaction time, poor following of instructions. AAROM exercises to L LE in all planes of motion.Bridging 2 x 7 Supine to sit  x3 with mod A, in EOB sitting facilitation of weight bearing through L UE, leaning to left and coming back to middle-mod/max A. Sit to stand 3 x with max A.Patient able to stand up to 45 seconds, poor WB on L, cues to achive midline position of head and reduce L side lean. Patient decided to stop exercising and stated she can not do any more due to her discomfort, nurse has been informed.  Therapy Documentation Precautions:  Precautions Precautions: Fall Precaution Comments: lethargic, leans to L; delayed processing Restrictions Weight Bearing Restrictions: No Pain: Pain Assessment Pain Assessment: 0-10 Pain Score:   4 Pain Type: Acute pain Pain Location: Head Pain Descriptors: Aching Pain Frequency: Occasional Patients Stated Pain Goal: 0 Pain Intervention(s): Medication (See eMAR)  See FIM for current functional status  Therapy/Group: Individual Therapy  Dorna Mai 03/01/2013, 11:46 AM

## 2013-03-02 ENCOUNTER — Inpatient Hospital Stay (HOSPITAL_COMMUNITY): Payer: TRICARE For Life (TFL)

## 2013-03-02 ENCOUNTER — Inpatient Hospital Stay (HOSPITAL_COMMUNITY): Payer: Medicare Other | Admitting: Speech Pathology

## 2013-03-02 ENCOUNTER — Inpatient Hospital Stay (HOSPITAL_COMMUNITY): Payer: Medicare Other | Admitting: Occupational Therapy

## 2013-03-02 DIAGNOSIS — I69993 Ataxia following unspecified cerebrovascular disease: Secondary | ICD-10-CM

## 2013-03-02 DIAGNOSIS — G8110 Spastic hemiplegia affecting unspecified side: Secondary | ICD-10-CM

## 2013-03-02 DIAGNOSIS — I6340 Cerebral infarction due to embolism of unspecified cerebral artery: Secondary | ICD-10-CM

## 2013-03-02 NOTE — Progress Notes (Signed)
Patient ID: [name], female   DOB: [birth year], 37 y.o.   MRN: [id number] Subjective/Complaints: 36y.o. 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 Admitted 02/16/2013 with complaints of headache and left-sided weakness with altered mental status. INR on admission of 1.14 with noted poor compliance on Coumadin. MRI of the brain showed acute infarct right MCA territory without hemorrhage as well as scattered small areas of acute infarct left frontal and parietal lobe. Patient did not receive TPA. Echocardiogram with ejection fraction of 60% without embolism. 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 There was a filling defect involving the innominate artery thought likely to represent thrombus. There was also occlusion of the right cavernous carotid and right M1 segment indicate of embolic phenomenon. Underwent ICA thrombectomy 02/16/2013 per interventional radiology. Patient had been placed on aspirin therapy per neurology services and Coumadin later resumed of which patient adamantly refused and patient was placed thus patient was placed on Xarelto for stroke prevention 02/20/2013  Pt does not recall  Last BM  Review of Systems  Neurological: Positive for dizziness and focal weakness.  All other systems reviewed and are negative.    Objective: Vital Signs: Blood pressure 153/73, pulse 88, temperature 98.7 F (37.1 C), temperature source Oral, resp. rate 18, weight 65.5 kg (144 lb 6.4 oz), last menstrual period 02/15/2013, SpO2 100.00%. No results found. Results for orders placed during the hospital encounter of 02/21/13 (from the past 72 hour(s))  CBC     Status: Abnormal   Collection Time    02/28/13  4:20 PM      Result Value Range   WBC 15.6 (*) 4.0 - 10.5 K/uL    RBC 3.77 (*) 3.87 - 5.11 MIL/uL   Hemoglobin 9.4 (*) 12.0 - 15.0 g/dL   HCT 40.9 (*) 81.1 - 91.4 %   MCV 77.7 (*) 78.0 - 100.0 fL   MCH 24.9 (*) 26.0 - 34.0 pg   MCHC 32.1  30.0 - 36.0 g/dL   RDW 78.2 (*) 95.6 - 21.3 %   Platelets 473 (*) 150 - 400 K/uL     HEENT: normal Gen:  NAD Heart regular rate and rhythm no rubs murmurs or exercise Abdomen positive bowel sounds soft nontender palpation Extremities no clubbing cyanosis or edema Musculoskeletal no shoulder pain with range of motion left side. Motor strength 5/5 in the right deltoid, biceps, triceps, grip, hip flexor, knee extensors, ankle dorsiflexor plantar flexor 0 in the left deltoid, biceps, triceps, grip in the hip flexor knee extensor ankle dorsiflexor and flexor left Sensation is able to identify light touch in the left upper and left lower extremity as well as on the right side. Evidence of ataxia finger nose to finger testing on the right side Unable to do finger-nose-finger testing on the left side secondary to weakness Unable to cooperate with visual field testing +aprosodia  Assessment/Plan: 1. Functional deficits secondary to R MCA infarct which require 3+ hours per day of interdisciplinary therapy in a comprehensive inpatient rehab setting. Physiatrist is providing close team supervision and 24 hour management of active medical problems listed below. Physiatrist and rehab team continue to assess barriers to discharge/monitor patient progress toward functional and medical goals. FIM: FIM - Bathing Bathing Steps Patient Completed: Chest;Left Arm;Abdomen;Front perineal area;Right upper leg;Left upper leg;Right lower leg (including foot)  Bathing: 3: Mod-Patient completes 5-7 71f 10 parts or 50-74%  FIM - Upper Body Dressing/Undressing Upper body dressing/undressing steps patient completed: Thread/unthread right sleeve of pullover shirt/dresss;Put head through opening of pull over shirt/dress;Pull shirt over  trunk Upper body dressing/undressing: 4: Min-Patient completed 75 plus % of tasks FIM - Lower Body Dressing/Undressing Lower body dressing/undressing steps patient completed: Thread/unthread right pants leg;Thread/unthread right underwear leg Lower body dressing/undressing: 2: Max-Patient completed 25-49% of tasks  FIM - Toileting Toileting steps completed by patient: Performs perineal hygiene Toileting: 1: Two helpers  FIM - Diplomatic Services operational officer Devices: Psychiatrist Transfers: 1-Two helpers  FIM - Banker Devices: Bed rails Bed/Chair Transfer: 2: Supine > Sit: Max A (lifting assist/Pt. 25-49%);1: Bed > Chair or W/C: Total A (helper does all/Pt. < 25%);2: Bed > Chair or W/C: Max A (lift and lower assist);2: Chair or W/C > Bed: Max A (lift and lower assist)  FIM - Locomotion: Wheelchair Distance: 150 Locomotion: Wheelchair: 0: Activity did not occur FIM - Locomotion: Ambulation Locomotion: Ambulation Assistive Devices: Programmer, systems - Platform Ambulation/Gait Assistance: 1: +2 Total assist Locomotion: Ambulation: 0: Activity did not occur  Comprehension Comprehension Mode: Auditory Comprehension: 5-Understands complex 90% of the time/Cues < 10% of the time  Expression Expression Mode: Verbal Expression: 5-Expresses complex 90% of the time/cues < 10% of the time  Social Interaction Social Interaction: 3-Interacts appropriately 50 - 74% of the time - May be physically or verbally inappropriate.  Problem Solving Problem Solving: 3-Solves basic 50 - 74% of the time/requires cueing 25 - 49% of the time  Memory Memory: 5-Recognizes or recalls 90% of the time/requires cueing < 10% of the time  Medical Problem List and Plan:  1. Thromboembolic right MCA infarct laeft hemiparesis, combination of Takayasu's arteritis and hyper coagulable state 2. DVT Prophylaxis/Anticoagulation: IVC filter/  Xarelto.Lupus anticoagulant antibody positive , non compliant with WarfarinMonitor for any signs of bleeding  3. Neuropsych: This patient is not capable of making decisions on his/her own behalf.  -will need constant positive reinforcement and education  -mother appears quite positive and supportive however  4. History of lupus anticoag Pos, no other clinical signs of SLE Continue prednisone for Takayasu's arteritis, CBGs look ok 5. MRSA positive nasal swab. Contact precautions  6. History of medical noncompliance. Counseling  7. Microcytic anemia. Latest hemoglobin 9.4. Followup CBC 8.  Chronic constipation add senna S- pt  inaccurate with bowel reports 9.  Leukocytosis on chronic steroid, afebrile LOS (Days) 9 A FACE TO FACE EVALUATION WAS PERFORMED  Pamlea Finder E 03/02/2013, 8:21 AM

## 2013-03-02 NOTE — Progress Notes (Signed)
Physical Therapy Session Note  Patient Details  Name: [name] MRN: [id number] Date of Birth: 1976/02/27  Today's Date: 03/02/2013 Time: 1100-1200 and 4098-1191 Time Calculation (min): 60 min and 45 min  Short Term Goals: Week 2:  PT Short Term Goal 1 (Week 2): Patient will perform bed mobility with min A,including rolling R and L and supine to sit with min A PT Short Term Goal 2 (Week 2): Patient will perform transfers with mod A from bed to w/c . PT Short Term Goal 3 (Week 2): Patient will propel wheelchair 200 feet with supervision and occasional cueing. PT Short Term Goal 4 (Week 2): patient will sit edge of bed or mat with supervision for 3 minutes  Skilled Therapeutic Interventions/Progress Updates:  1st tx- Treatment focused on basic and toilet transfers, w/c mobility, neuromuscular re-education during gait, bed mobility, LLE motor control.  Stand/pivot transfer bed> w/c> toilet>w/c; with mod assist, mostly to pivot LLE.  Pt demonstrated L inattention, impulsivity, and delayed processing which impair her functional mobility more that physical status.  W/c mobility using hemi technique with RUE and RLE, room toward gym.  Pt needed min assist for staying on R side of hall; she remembered directions to gym without cueing.  neuromuscular re-education via demo, VCs, manual cues for gait with R railing in hall, focusing on L stance control wearing L [place]ard Toe Off.  LLE continues with buckling or hyperextension with wt bearing; she was able to drop L knee and advance L foot independently 3/15 steps.  W/c> mat transfer as above.  In supine, bil bridging with mod assist; lower trunk rotation with mod assist due to L inattention.  2nd tx- Treatment focused on w/c mobility, neuromuscular re-education during transfers and standing.  neuromuscular re-education for:  -bed mobility with min assist for rolling R, mod cues to remember LUE.  Spontaneous L hip flexion noted when pt was in R  sidelying, as she moved LEs off of bed before sitting.  Pt given feedback for LLE movement. -bed> w/c stand pivot, focusing on L LE pivot -midline orientation in standing in static standing frame, focusing on L attention, use of R hand to write on mirror across midline, head righting, lateral wt shifting -terminal hip extension with cues for trunk flexion as she lowered her hips       Therapy Documentation Precautions:  Precautions Precautions: Fall Precaution Comments: lethargic, leans to L; delayed processing; impulsive Restrictions Weight Bearing Restrictions: No   Pain: Pain Assessment Pain Assessment: No/denies pain   Locomotion : Ambulation Ambulation/Gait Assistance: 1: +1 Total assist Wheelchair Mobility Distance: 150    Other Treatments: Treatments Neuromuscular Facilitation: Left;Activity to increase motor control;Activity to increase timing and sequencing;Forced use;Lower Extremity  See FIM for current functional status  Therapy/Group: Individual Therapy  Nayara Taplin 03/02/2013, 12:35 PM

## 2013-03-02 NOTE — Progress Notes (Signed)
Speech Language Pathology Daily Session Note  Patient Details  Name: [name] MRN: [id number] Date of Birth: 1976-02-05  Today's Date: 03/02/2013 Time: 0920-1000 Time Calculation (min): 40 min  Short Term Goals: Week 1: SLP Short Term Goal 1 (Week 1): Patient will initiate verbal communication with communication partner in response to questions with mod assist clinician cues.  SLP Short Term Goal 2 (Week 1): Patient will attend to left visual field during a functional ADL with moderate clinician cues.  SLP Short Term Goal 3 (Week 1): Patient will sustain attention to basic functional ADL for 15 minutes with moderate clinician cues.  SLP Short Term Goal 4 (Week 1): Patient will demonstrate intellectual awareness of deficits post CVA by naming 3 changes with moderate questions cues.  SLP Short Term Goal 5 (Week 1): Patient will recall and retuen use of call bell to request assistance with mod assist question cues.   Skilled Therapeutic Interventions: Skilled treatment session focused on addressing cognitive goals during self-care tasks.  Upon entering room SLP found patient with RN and tech who were assisting patient with getting cleaned up after vomiting.  SLP facilitated session by reducing environmental distractions and cuing patient.  Patient required max assist verbal, visual and tactile cues to attend to and complete 1-step directions while changing clothes and transferring.  Patient appeared more distracted today with increased cues needed to complete tasks.     FIM:  Comprehension Comprehension Mode: Auditory Comprehension: 4-Understands basic 75 - 89% of the time/requires cueing 10 - 24% of the time Expression Expression Mode: Verbal Expression: 5-Expresses basic 90% of the time/requires cueing < 10% of the time. Social Interaction Social Interaction: 3-Interacts appropriately 50 - 74% of the time - May be physically or verbally inappropriate. Problem Solving Problem Solving:  2-Solves basic 25 - 49% of the time - needs direction more than half the time to initiate, plan or complete simple activities Memory Memory: 3-Recognizes or recalls 50 - 74% of the time/requires cueing 25 - 49% of the time FIM - Eating Eating Activity: 5: Set-up assist for open containers  Pain Pain Assessment Pain Assessment: No/denies pain  Therapy/Group: Individual Therapy  Charlane Ferretti., CCC-SLP [phone]  [name] 03/02/2013, 3:39 PM

## 2013-03-02 NOTE — Progress Notes (Signed)
Occupational Therapy Weekly Progress Note  Patient Details  Name: [name] MRN: [id number] Date of Birth: 10-31-1976  Today's Date: 03/02/2013 Time: 0805-0903 Time Calculation (min): 58 min  Patient has met 5 of 5 short term goals.  Pt has been progressing with her attention, motor planning, left side awareness, and trunk control.  She has improved sitting balance and is able to complete UB ADLs with min to mod assist.  Patient continues to demonstrate the following deficits: flaccid LUE, severe weakness in LLE, decreased attention, perseveration on task, neck/trunk control to allow her to sit safely on bed, tub bench, or toilet and therefore will continue to benefit from skilled OT intervention to enhance overall performance with BADL and Reduce care partner burden.  Patient progressing toward long term goals..  Continue plan of care.  OT Short Term Goals Week 1:  OT Short Term Goal 1 (Week 1): Pt.  will maintain midline control for 5 minutes during grooming activity OT Short Term Goal 1 - Progress (Week 1): Met OT Short Term Goal 2 (Week 1): Pt. will perform grooming with mod assist OT Short Term Goal 2 - Progress (Week 1): Met OT Short Term Goal 3 (Week 1): Pt. will perform UB bathing with minimal assist and max with portural control OT Short Term Goal 3 - Progress (Week 1): Met OT Short Term Goal 4 (Week 1): Pt. will bathe LB with mod assist and max assist with sitting balance OT Short Term Goal 4 - Progress (Week 1): Met OT Short Term Goal 5 (Week 1): Pt. will dress UB with mod assist OT Short Term Goal 5 - Progress (Week 1): Met Week 2:  OT Short Term Goal 1 (Week 2): Pt will be able to transfer on and off a tub bench with mod assist. OT Short Term Goal 2 (Week 2): Pt will be able to maintain sitting balance on tub bench with min assist to demonstrate improved trunk control. OT Short Term Goal 3 (Week 2): Pt will demonstrate improved left side awareness by finding items on her  left side with minimal cuing. OT Short Term Goal 4 (Week 2): Pt will transfer on and off toilet with min assist. OT Short Term Goal 5 (Week 2): Pt will be able to stand statically with min assist with RUE support to allow caregivers to assist her with clothing management.  Skilled Therapeutic Interventions/Progress Updates:  Pt seen for BADL retraining of bathing at shower level and dressing with a focus on transfers, trunk control, sequencing and attention to task.  Pt transferred bed to roll in shower chair and shower chair to wheelchair to her left side with mod assist.  She becomes easily distracted and tends to focus on things unrelated to the situation.  In the shower she used a long sponge to bathe herself with min assist sitting the whole time. Mod cues for LUE management.  She demonstrated mod apraxia with orienting shirt and pants for dressing.Pt left in wheelchair at end of session with call light and quick release belt.    Continue OT 5-7 days a week for 60-90 min a day with Balance/vestibular training;Cognitive remediation/compensation;Community reintegration;Discharge planning;DME/adaptive equipment instruction;Functional mobility training;Functional electrical stimulation;Neuromuscular re-education;Patient/family education;Pain management;Self Care/advanced ADL retraining;Splinting/orthotics;Therapeutic Activities;Therapeutic Exercise;UE/LE Strength taining/ROM;UE/LE Coordination activities;Visual/perceptual remediation/compensation;Wheelchair propulsion/positioning to maximize her independence with ADLS and functional mobility.  Therapy Documentation Precautions:  Precautions Precautions: Fall Precaution Comments: lethargic, leans to L; delayed processing Restrictions Weight Bearing Restrictions: No Pain: Pain Assessment Pain Assessment: No/denies pain  ADL: See FIM for current functional status  Therapy/Group: Individual Therapy  SAGUIER,JULIA 03/02/2013, 11:45 AM

## 2013-03-03 ENCOUNTER — Inpatient Hospital Stay (HOSPITAL_COMMUNITY): Payer: Medicare Other

## 2013-03-03 ENCOUNTER — Inpatient Hospital Stay (HOSPITAL_COMMUNITY): Payer: Medicare Other | Admitting: Occupational Therapy

## 2013-03-03 ENCOUNTER — Inpatient Hospital Stay (HOSPITAL_COMMUNITY): Payer: Medicare Other | Admitting: Speech Pathology

## 2013-03-03 DIAGNOSIS — I6340 Cerebral infarction due to embolism of unspecified cerebral artery: Secondary | ICD-10-CM

## 2013-03-03 DIAGNOSIS — G8110 Spastic hemiplegia affecting unspecified side: Secondary | ICD-10-CM

## 2013-03-03 DIAGNOSIS — I69993 Ataxia following unspecified cerebrovascular disease: Secondary | ICD-10-CM

## 2013-03-03 NOTE — Progress Notes (Signed)
Occupational Therapy Session Note  Patient Details  Name: [name] MRN: [id number] Date of Birth: 12-03-1975  Today's Date: 03/03/2013 Time: 0900-1000 Time Calculation (min): 60 min  Short Term Goals: Week 1:  OT Short Term Goal 1 (Week 1): Pt.  will maintain midline control for 5 minutes during grooming activity OT Short Term Goal 1 - Progress (Week 1): Met OT Short Term Goal 2 (Week 1): Pt. will perform grooming with mod assist OT Short Term Goal 2 - Progress (Week 1): Met OT Short Term Goal 3 (Week 1): Pt. will perform UB bathing with minimal assist and max with portural control OT Short Term Goal 3 - Progress (Week 1): Met OT Short Term Goal 4 (Week 1): Pt. will bathe LB with mod assist and max assist with sitting balance OT Short Term Goal 4 - Progress (Week 1): Met OT Short Term Goal 5 (Week 1): Pt. will dress UB with mod assist OT Short Term Goal 5 - Progress (Week 1): Met Week 2:  OT Short Term Goal 1 (Week 2): Pt will be able to transfer on and off a tub bench with mod assist. OT Short Term Goal 2 (Week 2): Pt will be able to maintain sitting balance on tub bench with min assist to demonstrate improved trunk control. OT Short Term Goal 3 (Week 2): Pt will demonstrate improved left side awareness by finding items on her left side with minimal cuing. OT Short Term Goal 4 (Week 2): Pt will transfer on and off toilet with min assist. OT Short Term Goal 5 (Week 2): Pt will be able to stand statically with min assist with RUE support to allow caregivers to assist her with clothing management.  Skilled Therapeutic Interventions/Progress Updates:      Pt seen for BADL retraining of toileting, bathing, and dressing with a focus on attention to task, transfers, sitting balance and LUE management. Pt requested to use the bathroom first. She was able to perform stand pivot transfers bed to chair then to [facility] over to toilet and back to wheelchair all with min to mod assist x1.  Pt completed  using bathroom and needed mod cues to cleanse self as she was very distracted today.  Due to the extended time on the toilet, pt had less time for B/D so she needed to stay focused and on task and this required mod cues.  To pull pants up during dressing pt stood at sink with min to mod assist. She did try partially pulling pants up with RUE. At end of session, pt left in w/c with lap belt to allow her to complete brushing her teeth. Pt frequently asks why her LOS needs to be 4 weeks, demonstrating poor insight as to her level of function.  Therapy Documentation Precautions:  Precautions Precautions: Fall Precaution Comments: lethargic, leans to L; delayed processing; impulsive Restrictions Weight Bearing Restrictions: No  Pain: Pain Assessment Pain Assessment: 0-10 Pain Score:   4 Pain Type: Acute pain Pain Location: Leg Pain Orientation: Right Pain Descriptors: Aching Pain Onset: Gradual Patients Stated Pain Goal: 1 Pain Intervention(s): MD notified (Comment) ADL: See FIM for current functional status  Therapy/Group: Individual Therapy  [name] 03/03/2013, 9:33 AM

## 2013-03-03 NOTE — Progress Notes (Signed)
Physical Therapy Session Note  Patient Details  Name: [name] MRN: [id number] Date of Birth: December 25, 1975  Today's Date: 03/03/2013 Time: 1415-1440 Time Calculation (min): 25 min  Short Term Goals: Week 2:  PT Short Term Goal 1 (Week 2): Patient will perform bed mobility with min A,including rolling R and L and supine to sit with min A PT Short Term Goal 2 (Week 2): Patient will perform transfers with mod A from bed to w/c . PT Short Term Goal 3 (Week 2): Patient will propell wheelchair 200 feet with supervision and occasional cueing. PT Short Term Goal 4 (Week 2): patient will sit edge of bed or mat with supervision for 3 minutes  Skilled Therapeutic Interventions/Progress Updates:  Session 1 - 11:00 - 12:00 Patient sitting in wheelchair upon entering room. Patient transferred wheelchair to mat by squat pivot with min assist. Patient sit to supine with min assist. Utilized suspension grid to facilitate left LE active movement. Patient able to perform active hip flexion, extension, and knee flexion/extension in sidelying 4 sets of 10 reps each. Patient also performed 3 sets of 10 reps hip abduction/adduction in supine. Patient demonstrating marked improvement in active movement in left LE. Patient supine to sit with min assist and verbal cueing. Patient transferred mat to wheelchair with min assist squat pivot. Patient sit to stand and ambulated using railing in hallway 30 feet x 2 with max assist of 1. Patient using orthosis and shoe cover on left was able to progress left LE every step. Patient required assist with weight shifting to left. Patient had decreased weight shift and stance time on left however was able to maintain knee control without hyperextending or buckling on left. Patient ambulated up and down 3 four inch steps using 1 rail and with max assist. Patient able to pull left LE up onto the step and to step down with the left. Assistance required to maintain knee control during  stance on left. Patient returned to room and left in wheelchair with quick release belt in place and all items in reach.  Session 2 -  2:15 - 2:40 Patient resting in bed and requested not to get up for pm session. Patient agreed to exercise in bed. PROM to left UE with attempts to facilitate elbow flexion/extension and wrist extension. Patient performed active assistive exercises to left LE including hip flexion/extension and knee flexion/extension.   Therapy Documentation Precautions:  Precautions Precautions: Fall Precaution Comments: lethargic, leans to L; delayed processing; impulsive Restrictions Weight Bearing Restrictions: No Vital Signs: Therapy Vitals Temp: 97.9 F (36.6 C) Temp src: Oral Pulse Rate: 68 Resp: 20 BP: 98/46 mmHg Patient Position, if appropriate: Lying Oxygen Therapy SpO2: 98 % O2 Device: None (Room air) Pain: Pain Assessment Pain Assessment: No/denies pain Locomotion : Ambulation Ambulation/Gait Assistance: 2: Max Lawyer Distance: 150   See FIM for current functional status  Therapy/Group: Individual Therapy  Alma Friendly 03/03/2013, 3:46 PM

## 2013-03-03 NOTE — Progress Notes (Signed)
Patient ID: [name], female   DOB: 02/18/1976, 37 y.o.   MRN: [id number] Subjective/Complaints: No New complaints 36y.o. 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 Admitted 02/16/2013 with complaints of headache and left-sided weakness with altered mental status. INR on admission of 1.14 with noted poor compliance on Coumadin. MRI of the brain showed acute infarct right MCA territory without hemorrhage as well as scattered small areas of acute infarct left frontal and parietal lobe. Patient did not receive TPA. Echocardiogram with ejection fraction of 60% without embolism. 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 There was a filling defect involving the innominate artery thought likely to represent thrombus. There was also occlusion of the right cavernous carotid and right M1 segment indicate of embolic phenomenon. Underwent ICA thrombectomy 02/16/2013 per interventional radiology. Patient had been placed on aspirin therapy per neurology services and Coumadin later resumed of which patient adamantly refused and patient was placed thus patient was placed on Xarelto for stroke prevention 02/20/2013  Pt does not recall  Last BM  Review of Systems  Neurological: Positive for dizziness and focal weakness.  All other systems reviewed and are negative.    Objective: Vital Signs: Blood pressure 134/76, pulse 88, temperature 98.1 F (36.7 C), temperature source Oral, resp. rate 19, weight 65.5 kg (144 lb 6.4 oz), last menstrual period 02/15/2013, SpO2 95.00%. No results found. Results for orders placed during the hospital encounter of 02/21/13 (from the past 72 hour(s))  CBC     Status: Abnormal   Collection Time    02/28/13  4:20 PM      Result Value Range   WBC 15.6 (*) 4.0  - 10.5 K/uL   RBC 3.77 (*) 3.87 - 5.11 MIL/uL   Hemoglobin 9.4 (*) 12.0 - 15.0 g/dL   HCT 78.2 (*) 95.6 - 21.3 %   MCV 77.7 (*) 78.0 - 100.0 fL   MCH 24.9 (*) 26.0 - 34.0 pg   MCHC 32.1  30.0 - 36.0 g/dL   RDW 08.6 (*) 57.8 - 46.9 %   Platelets 473 (*) 150 - 400 K/uL     HEENT: normal Gen:  NAD Heart regular rate and rhythm no rubs murmurs or exercise Abdomen positive bowel sounds soft nontender palpation Extremities no clubbing cyanosis or edema Musculoskeletal no shoulder pain with range of motion left side. Motor strength 5/5 in the right deltoid, biceps, triceps, grip, hip flexor, knee extensors, ankle dorsiflexor plantar flexor 0 in the left deltoid, biceps, triceps, grip in the hip flexor knee extensor ankle dorsiflexor and flexor left Sensation is able to identify light touch in the left upper and left lower extremity as well as on the right side. Evidence of ataxia finger nose to finger testing on the right side Unable to do finger-nose-finger testing on the left side secondary to weakness Unable to cooperate with visual field testing +aprosodia  Assessment/Plan: 1. Functional deficits secondary to R MCA infarct which require 3+ hours per day of interdisciplinary therapy in a comprehensive inpatient rehab setting. Physiatrist is providing close team supervision and 24 hour management of active medical problems listed below. Physiatrist and rehab team continue to assess barriers to discharge/monitor patient progress toward functional and medical goals. FIM: FIM - Bathing Bathing Steps Patient Completed: Chest;Right Arm;Left Arm;Abdomen;Front perineal area;Right upper leg;Left upper leg;Left  lower leg (including foot);Right lower leg (including foot) Bathing: 4: Min-Patient completes 8-9 68f 10 parts or 75+ percent  FIM - Upper Body Dressing/Undressing Upper body dressing/undressing steps patient completed: Thread/unthread right sleeve of pullover shirt/dresss;Put head  through opening of pull over shirt/dress;Pull shirt over trunk Upper body dressing/undressing: 4: Min-Patient completed 75 plus % of tasks FIM - Lower Body Dressing/Undressing Lower body dressing/undressing steps patient completed: Thread/unthread right underwear leg;Thread/unthread right pants leg Lower body dressing/undressing: 2: Max-Patient completed 25-49% of tasks  FIM - Toileting Toileting steps completed by patient: Performs perineal hygiene Toileting: 1: Two helpers  FIM - Diplomatic Services operational officer Devices: Psychiatrist Transfers: 3-To toilet/BSC: Mod A (lift or lower assist)  FIM - Banker Devices: Arm rests Bed/Chair Transfer: 3: Supine > Sit: Mod A (lifting assist/Pt. 50-74%/lift 2 legs;3: Sit > Supine: Mod A (lifting assist/Pt. 50-74%/lift 2 legs);3: Bed > Chair or W/C: Mod A (lift or lower assist);3: Chair or W/C > Bed: Mod A (lift or lower assist)  FIM - Locomotion: Wheelchair Distance: 150 Locomotion: Wheelchair: 4: Travels 150 ft or more: maneuvers on rugs and over door sillls with minimal assistance (Pt.>75%) FIM - Locomotion: Ambulation Locomotion: Ambulation Assistive Devices: Other (comment);Orthosis (R railing in hall) Ambulation/Gait Assistance: 1: +1 Total assist Locomotion: Ambulation: 1: Travels less than 50 ft with total assistance/helper does all (Pt.<25%) (15')  Comprehension Comprehension Mode: Auditory Comprehension: 4-Understands basic 75 - 89% of the time/requires cueing 10 - 24% of the time  Expression Expression Mode: Verbal Expression: 5-Expresses basic 90% of the time/requires cueing < 10% of the time.  Social Interaction Social Interaction: 3-Interacts appropriately 50 - 74% of the time - May be physically or verbally inappropriate.  Problem Solving Problem Solving: 2-Solves basic 25 - 49% of the time - needs direction more than half the time to initiate, plan or complete  simple activities  Memory Memory: 3-Recognizes or recalls 50 - 74% of the time/requires cueing 25 - 49% of the time  Medical Problem List and Plan:  1. Thromboembolic right MCA infarct laeft hemiparesis, combination of Takayasu's arteritis and hyper coagulable state. Plan team conference tomorrow 2. DVT Prophylaxis/Anticoagulation: IVC filter/ Xarelto.Lupus anticoagulant antibody positive , non compliant with WarfarinMonitor for any signs of bleeding  3. Neuropsych: This patient is not capable of making decisions on his/her own behalf.  -will need constant positive reinforcement and education  -mother appears quite positive and supportive however  4. History of lupus anticoag Pos, no other clinical signs of SLE Continue prednisone for Takayasu's arteritis, CBGs look ok 5. MRSA positive nasal swab. Contact precautions and remains afebrile 6. History of medical noncompliance. Counseling  7. Microcytic anemia. Latest hemoglobin 9.4. Followup CBC as needed 8.  Chronic constipation add senna S- pt  inaccurate with bowel reports. Adjust bowel program as needed 9.  Leukocytosis on chronic steroid, afebrile LOS (Days) 10 A FACE TO FACE EVALUATION WAS PERFORMED  ANGIULLI,DANIEL J. 03/03/2013, 9:04 AM

## 2013-03-03 NOTE — Progress Notes (Signed)
Speech Language Pathology Daily Session Note & Weekly Progress Summary  Patient Details  Name: [name] MRN: [id number] Date of Birth: 30-May-1976  Today's Date: 03/03/2013 Time: 0915-0950 Time Calculation (min): 35 min  Short Term Goals: Week 1: SLP Short Term Goal 1 (Week 1): Patient will initiate verbal communication with communication partner in response to questions with mod assist clinician cues.  SLP Short Term Goal 2 (Week 1): Patient will attend to left visual field during a functional ADL with moderate clinician cues.  SLP Short Term Goal 3 (Week 1): Patient will sustain attention to basic functional ADL for 15 minutes with moderate clinician cues.  SLP Short Term Goal 4 (Week 1): Patient will demonstrate intellectual awareness of deficits post CVA by naming 3 changes with moderate questions cues.  SLP Short Term Goal 5 (Week 1): Patient will recall and retuen use of call bell to request assistance with mod assist question cues.   Skilled Therapeutic Interventions: Skilled treatment session focused on addressing cognitive goals during self-care tasks.  Upon entering room SLP found patient in room with oral care set-up at sink, water running her in the corner trying to get cell phone off floor.  SLP facilitated session with max assist cues to get patient to complete oral care with cues to initiate, sustain attention to and complete each step of task.  Patient perseverated on needing to call her university.  As a result, SLP facilitated session with set-up of cell phone and charger which appeared to be broken and would not connect or charge.  Patient then requested SLP set-up computer to look up number which required max assist to problem solve.  Patient called school and was placed on hold; SLP educated patient regarding need to complete skilled therapeutic tasks during session and requested she finish her phone call after the completion of their session time together.  Patient refused  and session was ended early.  Patient continues to demonstrate significantly impaired sustained attention and awareness of deficits, which impact her ability to perform basic daily tasks.    FIM:  Comprehension Comprehension Mode: Auditory Comprehension: 4-Understands basic 75 - 89% of the time/requires cueing 10 - 24% of the time Expression Expression Mode: Verbal Expression: 5-Expresses basic 90% of the time/requires cueing < 10% of the time. Social Interaction Social Interaction: 3-Interacts appropriately 50 - 74% of the time - May be physically or verbally inappropriate. Problem Solving Problem Solving: 2-Solves basic 25 - 49% of the time - needs direction more than half the time to initiate, plan or complete simple activities Memory Memory: 3-Recognizes or recalls 50 - 74% of the time/requires cueing 25 - 49% of the time  Pain Pain Assessment Pain Assessment: No/denies pain  Therapy/Group: Individual Therapy   Speech Language Pathology Weekly Progress Note  Patient Details  Name: [name] MRN: [id number] Date of Birth: [birth year]  Today's Date: 03/03/2013  Short Term Goals: Week 1: SLP Short Term Goal 1 (Week 1): Patient will initiate verbal communication with communication partner in response to questions with mod assist clinician cues.  SLP Short Term Goal 1 - Progress (Week 1): Met SLP Short Term Goal 2 (Week 1): Patient will attend to left visual field during a functional ADL with moderate clinician cues.  SLP Short Term Goal 2 - Progress (Week 1): Progressing toward goal SLP Short Term Goal 3 (Week 1): Patient will sustain attention to basic functional ADL for 15 minutes with moderate clinician cues.  SLP Short Term Goal 3 -  Progress (Week 1): Progressing toward goal SLP Short Term Goal 4 (Week 1): Patient will demonstrate intellectual awareness of deficits post CVA by naming 3 changes with moderate questions cues.  SLP Short Term Goal 4 - Progress (Week 1):  Progressing toward goal SLP Short Term Goal 5 (Week 1): Patient will recall and retuen use of call bell to request assistance with mod assist question cues.  SLP Short Term Goal 5 - Progress (Week 1): Met Week 2: SLP Short Term Goal 1 (Week 2): Patient will initiate verbal communication with communication partner in response to questions with min assist clinician cues.  SLP Short Term Goal 2 (Week 2): Patient will attend to left visual field during a functional ADL with moderate clinician cues.  SLP Short Term Goal 3 (Week 2): Patient will sustain attention to basic functional ADL for 2 minutes with moderate clinician cues.  SLP Short Term Goal 4 (Week 2): Patient will demonstrate intellectual awareness of deficits post CVA by naming 2 physical and 1 cognitive change with moderate questions cues.  SLP Short Term Goal 5 (Week 2): Patient will utilize external aids to assist with recall of information with mod assist question cues  Weekly Progress Updates: Patient met 2 out of 5 short term objectives this reporting period due to gains in initiation of verbal expression and use of the call bell to request assistance.  Patient's willingness to participate, ability to sustain attention to task and poor awareness of deficits continue to impact her ability to safely complete basic tasks without max clinician assist.  As a result, it is recommended that this patient receive skilled SLP service to continue to address these deficits in functional activities to maximize functional independence and reduce burden of care prior to discharge.    SLP Intensity: Minumum of 1-2 x/day, 30 to 90 minutes SLP Frequency: 5 out of 7 days SLP Duration/Estimated Length of Stay: 2 weeks SLP Treatment/Interventions: Cognitive remediation/compensation;Cueing hierarchy;Dysphagia/aspiration precaution training;Environmental controls;Functional tasks;Internal/external aids;Oral motor exercises;Patient/family  education;Speech/Language facilitation;Therapeutic Activities  Charlane Ferretti., CCC-SLP [phone]  Jowel Waltner 03/03/2013, 10:55 AM

## 2013-03-04 ENCOUNTER — Inpatient Hospital Stay (HOSPITAL_COMMUNITY): Payer: Medicare Other | Admitting: Speech Pathology

## 2013-03-04 ENCOUNTER — Inpatient Hospital Stay (HOSPITAL_COMMUNITY): Payer: TRICARE For Life (TFL)

## 2013-03-04 ENCOUNTER — Inpatient Hospital Stay (HOSPITAL_COMMUNITY): Payer: Medicare Other | Admitting: Occupational Therapy

## 2013-03-04 NOTE — Progress Notes (Signed)
Occupational Therapy Session Note  Patient Details  Name: [name] MRN: [id number] Date of Birth: Jan 08, 1976  Today's Date: 03/04/2013 Time: 1000-1100 Time Calculation (min): 60 min  Short Term Goals: Week 1:  OT Short Term Goal 1 (Week 1): Pt.  will maintain midline control for 5 minutes during grooming activity OT Short Term Goal 1 - Progress (Week 1): Met OT Short Term Goal 2 (Week 1): Pt. will perform grooming with mod assist OT Short Term Goal 2 - Progress (Week 1): Met OT Short Term Goal 3 (Week 1): Pt. will perform UB bathing with minimal assist and max with portural control OT Short Term Goal 3 - Progress (Week 1): Met OT Short Term Goal 4 (Week 1): Pt. will bathe LB with mod assist and max assist with sitting balance OT Short Term Goal 4 - Progress (Week 1): Met OT Short Term Goal 5 (Week 1): Pt. will dress UB with mod assist OT Short Term Goal 5 - Progress (Week 1): Met Week 2:  OT Short Term Goal 1 (Week 2): Pt will be able to transfer on and off a tub bench with mod assist. OT Short Term Goal 2 (Week 2): Pt will be able to maintain sitting balance on tub bench with min assist to demonstrate improved trunk control. OT Short Term Goal 3 (Week 2): Pt will demonstrate improved left side awareness by finding items on her left side with minimal cuing. OT Short Term Goal 4 (Week 2): Pt will transfer on and off toilet with min assist. OT Short Term Goal 5 (Week 2): Pt will be able to stand statically with min assist with RUE support to allow caregivers to assist her with clothing management.  Skilled Therapeutic Interventions/Progress Updates:       Pt seen for BADL retraining of toileting, bathing, and dressing with a focus on attention, awareness, midline control, LUE management and transfers.   Pt's mother present for therapy and was involved in adls.  Pt was more attentive and needed fewer cuing.  Pt did well with tub bench transfers with mod assist and grab bar and was somewhat  impulsive with transferring out of tub.  Discussed goals for therapy, preparing for D/c and home bathroom set up. At end of session, pt left in wheelchair with mom by her side waiting for PT session.  Therapy Documentation Precautions:  Precautions Precautions: Fall Precaution Comments: lethargic, leans to L; delayed processing; impulsive Restrictions Weight Bearing Restrictions: No  Pain: Pain Assessment Pain Assessment: No/denies pain ADL: See FIM for current functional status  Therapy/Group: Individual Therapy  [name] 03/04/2013, 12:20 PM

## 2013-03-04 NOTE — Progress Notes (Signed)
Speech Language Pathology Daily Session Note  Patient Details  Name: [name] MRN: [id number] Date of Birth: [birth year]  Today's Date: 03/04/2013 Time: 5621-3086 Time Calculation (min): 57 min  Short Term Goals: Week 2: SLP Short Term Goal 1 (Week 2): Patient will initiate verbal communication with communication partner in response to questions with min assist clinician cues.  SLP Short Term Goal 2 (Week 2): Patient will attend to left visual field during a functional ADL with moderate clinician cues.  SLP Short Term Goal 3 (Week 2): Patient will sustain attention to basic functional ADL for 2 minutes with moderate clinician cues.  SLP Short Term Goal 4 (Week 2): Patient will demonstrate intellectual awareness of deficits post CVA by naming 2 physical and 1 cognitive change with moderate questions cues.  SLP Short Term Goal 5 (Week 2): Patient will utilize external aids to assist with recall of information with mod assist question cues  Skilled Therapeutic Interventions: Skilled treatment session focused on addressing cognitive goals during self-care tasks.  SLP facilitated session with mod assist verbal cues to recall strategies for donning pants as well as transfer set-up.  Patient's mother present for session and was helpful in motivating patient as well as cuing her to recall previously mentioned strategies.  SLP also facilitated session with max assist faded to mod assist verbal and visual cues to attend to reading task for maximum of 2 minutes.  Patient completed financial problem solving tasks with max assist due to inattention, decreased organizational strategies and decreased self-monitoring.   FIM:  Comprehension Comprehension Mode: Auditory Comprehension: 4-Understands basic 75 - 89% of the time/requires cueing 10 - 24% of the time Expression Expression Mode: Verbal Expression: 5-Expresses basic needs/ideas: With extra time/assistive device Social Interaction Social  Interaction: 3-Interacts appropriately 50 - 74% of the time - May be physically or verbally inappropriate. Problem Solving Problem Solving: 2-Solves basic 25 - 49% of the time - needs direction more than half the time to initiate, plan or complete simple activities Memory Memory: 3-Recognizes or recalls 50 - 74% of the time/requires cueing 25 - 49% of the time FIM - Eating Eating Activity: 5: Set-up assist for open containers  Pain Pain Assessment Pain Assessment: No/denies pain  Therapy/Group: Individual Therapy  Charlane Ferretti., CCC-SLP [phone]  Darin Arndt 03/04/2013, 4:01 PM

## 2013-03-04 NOTE — Progress Notes (Signed)
Social Work Patient ID: [name], female   DOB: 1976-11-11, 37 y.o.   MRN: [id number] Met with pt to inform team conference progression toward min level goals and discharge 4/25.  Pt is pleased with the return she is getting In her left hand and wants to surpass goals of min level.  She is wiling to stay until the 25 and will work hard in therapies.  Will discuss with Mom When she returns, ran an errand.  Discussed if going to her condo will need to focus on stairs since she has numerous steps to bedroom and full bath. Pt wants to do well.  Continue to follow and provide support.

## 2013-03-04 NOTE — Progress Notes (Signed)
Physical Therapy Session Note  Patient Details  Name: [name] MRN: [id number] Date of Birth: 1975/12/07  Today's Date: 03/04/2013 Time:1132-1202 and  1400-1500 Time Calculation (min): 30 min and 60 min  Short Term Goals: Week 2:  PT Short Term Goal 1 (Week 2): Patient will perform bed mobility with min A,including rolling R and L and supine to sit with min A PT Short Term Goal 2 (Week 2): Patient will perform transfers with mod A from bed to w/c . PT Short Term Goal 3 (Week 2): Patient will propell wheelchair 200 feet with supervision and occasional cueing. PT Short Term Goal 4 (Week 2): patient will sit edge of bed or mat with supervision for 3 minutes  Skilled Therapeutic Interventions/Progress Updates:  1st tx- Treatment focused on w/c mobility and car transfer x 2, donning Allard L AFO.   Mother participated in tx: observed donning LAFO, w/c>< car transfer.  Car transfer stand/pivot with min/mod assist, mod cues for set-up, safety.  2nd tx: Sit > stand at sink , maintained standing with min> mod assist while RN cleansed pt after BM.  Pt is unsafe during standing and ambulating as she removes her R hand from the support surface occasionally.  W/c changed from high back with slight recline to standard w/c.  W/c mobility using hemi technique with distant supervision.  Min assist to back up and position w/c in narrow space, due to L inattention.  W/c> mat stand pivot transfer, with min assist for pivoting LLE and controlled descent.  Sit><supine with min assist, mod cues to move slowly, remember techniques for LUE and LLE.  neuromuscular re-education via manual cues, VCs, visual feedback for: -supine bridging and lower trunk rotation, R sidelying L hip flexion/extension with therapist holding weight of LE -LLE stance and swing phase control, during gait with max assist x 10' with LPFRW and x 20' using hallway railing on R; for L hip and knee extension, and L foot placement.  Due to  flaccid LUE, it was difficult to keep pt's LUE in platform support comfortably.    At end of session, pt agreed to stay sitting up in w/c for a while; safety belt applied, needs placed within reach.    Therapy Documentation Precautions:  Precautions Precautions: Fall Precaution Comments: lethargic, leans to L; delayed processing; impulsive Restrictions Weight Bearing Restrictions: No        See FIM for current functional status  Therapy/Group: Individual Therapy  Shimika Ames 03/04/2013, 3:23 PM

## 2013-03-04 NOTE — Patient Care Conference (Signed)
Inpatient RehabilitationTeam Conference and Plan of Care Update Date: 03/04/2013   Time: 10:50 AM    Patient Name: [name]      Medical Record Number: [id number]  Date of Birth: March 20, 1976 Sex: Female         Room/Bed: 4039/4039-01 Payor Info: Payor: MEDICARE  Plan: MEDICARE PART A AND B  Product Type: *No Product type*     Admitting Diagnosis: rt cva  Admit Date/Time:  02/21/2013  8:03 PM Admission Comments: No comment available   Primary Diagnosis:  Embolic infarction Principal Problem: Embolic infarction  Patient Active Problem List   Diagnosis Date Noted  . 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 Postprocedural respiratory failure 02/16/2013  . Essential hypertension, malignant 02/16/2013  . Sinus tachycardia 02/16/2013  . CVA (cerebral infarction) 12/05/2012  . Subtherapeutic international normalized ratio (INR) 12/05/2012  . ANEMIA, IRON DEFICIENCY 09/04/2006  . DEPRESSION 09/04/2006  . 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 THROMBOSIS, VENOUS 09/04/2006  . CONSTIPATION 09/04/2006  . OVARIAN CYST 09/04/2006  . FOOT DROP, LEFT 09/04/2006    Expected Discharge Date: Expected Discharge Date: 03/20/13  Team Members Present: Physician leading conference: Dr. Claudette Laws Social Worker Present: Dossie Der, LCSW Nurse Present: Rosalio Macadamia, RN PT Present: [name], PT;Caroline Adriana Simas, PT;Other (comment) Clarisse Gouge Ripa-PT) OT Present: Leonette Monarch, Felipa Eth, OT SLP Present: Fae Pippin, SLP Other (Discipline and Name): Mare Noel-PPS Coordinator     Current Status/Progress Goal Weekly Team Focus  Medical   poor awareness, initially resistant to ritalin, getting some return in LLE  Improve awareness of deficits  Medication trial   Bowel/Bladder   cont bowel and bladder  cont of B+B  cont of B +b   Swallow/Nutrition/ Hydration   Dys.3 textures and thin liquids with intermittent staff supervision   least restrictive p.o. intake  increase awarness, self monitoring  and correcting   ADL's   min-mod with bathing and UB dressing, max with toileting and LB dressing, min to mod with stand pivot transfers, improving trunk control and sititing balance, no change in LUE, continues to be easily distracted  min assist overall  ADL retraining, LUE neuro re-ed, functional mobility training, balance activities   Mobility   min assist with bed mobility, min to mod assist with transfers, max assist amb with railing in hallway. Patient is able to progress left LE and with better knee control in stance on left using orthosis.  min assist transfers and short distance ambulation  NM re-education; strengthening; bed mobility, transfers, standing tolerance, ambulation   Communication   mod assist  min assist  increased initiation    Safety/Cognition/ Behavioral Observations  mod-max assist  min assist  increase initiation, awareness, self-monitoring and correcting   Pain   N/A  pain controlled with prn medication  free of pain   Skin   N/A            *See Care Plan and progress notes for long and short-term goals.  Barriers to Discharge: 24/7 care post d/c    Possible Resolutions to Barriers:  ? if mother can do 24/7 care    Discharge Planning/Teaching Needs:  Home with mom who will provide 24 hour care-also has helper.  Mom has been here to observe pt in therapies      Team Discussion:  Making good progress-getting movement in left hand.  Grounds pass.  Insight, attention, safety awareness impedes ambulation.  Ritalin started.  Headaches better pain controlled  Revisions to Treatment Plan:  None  Continued Need for Acute Rehabilitation Level of Care: The patient requires daily medical management by a physician with specialized training in physical medicine and rehabilitation for the following conditions: Daily direction of a multidisciplinary physical rehabilitation program to ensure safe treatment while eliciting the highest outcome that is of practical  value to the patient.: Yes Daily medical management of patient stability for increased activity during participation in an intensive rehabilitation regime.: Yes Daily analysis of laboratory values and/or radiology reports with any subsequent need for medication adjustment of medical intervention for : Neurological problems  TRUE Garciamartinez, Lemar Livings 03/04/2013, 1:28 PM

## 2013-03-04 NOTE — Progress Notes (Signed)
NUTRITION FOLLOW UP  Intervention:   1. Continue Resource Breeze  2. Provide frequent encouragement with meals given ongoing distractibility 3. RD to continue to follow nutrition care plan  Nutrition Dx:   Increased nutrient needs related to acute illness/injury as evidenced by estimated needs. Ongoing.  Goal:   Intake to meet >90% of estimated nutrition needs.   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 Admitted 3/24 with complaints of headache and left-sided weakness with AMS. MRI of the brain showed acute infarct right MCA territory without hemorrhage as well as scattered small areas of acute infarct left frontal and parietal lobe. Underwent ICA thrombectomy 03/24.   Working towards d/c of 4/25. Meal intake is variable, consuming 25 - 100% of Dysphagia 3 meals. Continues with order for Resource Breeze po TID.  Hx of constipation at home. Continues with variable constipation at this time. Senna added.  Per therapy notes, pt continues to be very distracted with ADLs.  Height: Ht Readings from Last 1 Encounters:  02/16/13 5\' 8"  (1.727 m)    Weight Status:   Wt Readings from Last 1 Encounters:  02/27/13 144 lb 6.4 oz (65.5 kg)  Wt stable.  Re-estimated needs:  Kcal: 1650 - 1900 Protein: 90 - 100 Fluid: at least 1.5 liters daily  Skin: intact  Diet Order: Dysphagia 3; thin   Intake/Output Summary (Last 24 hours) at 03/04/13 0930 Last data filed at 03/03/13 1900  Gross per 24 hour  Intake    480 ml  Output      0 ml  Net    480 ml    Last BM: 4/7   Labs:  No results found for this basename: NA, K, CL, CO2, BUN, CREATININE, CALCIUM, MG, PHOS, GLUCOSE,  in the last 168 hours  CBG (last 3)  No results found for this basename: GLUCAP,  in the last 72  hours  Scheduled Meds: . feeding supplement  1 Container Oral TID BM  . methylphenidate  5 mg Oral BID WC  . multivitamin with minerals  1 tablet Oral Daily  . predniSONE  20 mg Oral Daily  . rivaroxaban  20 mg Oral Q supper  . senna-docusate  2 tablet Oral QHS  . temazepam  7.5 mg Oral QHS    Continuous Infusions:  none  Jarold Motto MS, RD, LDN Pager: [phone] After-hours pager: [phone]

## 2013-03-04 NOTE — Progress Notes (Signed)
Patient ID: [name], female   DOB: [birth year], 37 y.o.   MRN: [id number] Subjective/Complaints: No New complaints 36y.o. 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 Admitted 02/16/2013 with complaints of headache and left-sided weakness with altered mental status. INR on admission of 1.14 with noted poor compliance on Coumadin. MRI of the brain showed acute infarct right MCA territory without hemorrhage as well as scattered small areas of acute infarct left frontal and parietal lobe. Patient did not receive TPA. Echocardiogram with ejection fraction of 60% without embolism. 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 There was a filling defect involving the innominate artery thought likely to represent thrombus. There was also occlusion of the right cavernous carotid and right M1 segment indicate of embolic phenomenon. Underwent ICA thrombectomy 02/16/2013 per interventional radiology. Patient had been placed on aspirin therapy per neurology services and Coumadin later resumed of which patient adamantly refused and patient was placed thus patient was placed on Xarelto for stroke prevention 02/20/2013  Discussed rationale for ritalin  Review of Systems  Neurological: Positive for dizziness and focal weakness.  All other systems reviewed and are negative.    Objective: Vital Signs: Blood pressure 105/75, pulse 86, temperature 97.4 F (36.3 C), temperature source Oral, resp. rate 18, weight 65.5 kg (144 lb 6.4 oz), last menstrual period 02/15/2013, SpO2 97.00%. No results found. No results found for this or any previous visit (from the past 72 hour(s)).   HEENT: normal Gen:  NAD Heart regular rate and rhythm no rubs murmurs or exercise Abdomen positive bowel sounds soft  nontender palpation Extremities no clubbing cyanosis or edema Musculoskeletal no shoulder pain with range of motion left side. Motor strength 5/5 in the right deltoid, biceps, triceps, grip, hip flexor, knee extensors, ankle dorsiflexor plantar flexor 0 in the left deltoid, biceps, triceps, grip in the hip flexor knee extensor ankle dorsiflexor and flexor left Sensation is able to identify light touch in the left upper and left lower extremity as well as on the right side. Evidence of ataxia finger nose to finger testing on the right side Unable to do finger-nose-finger testing on the left side secondary to weakness Unable to cooperate with visual field testing +aprosodia  Assessment/Plan: 1. Functional deficits secondary to R MCA infarct which require 3+ hours per day of interdisciplinary therapy in a comprehensive inpatient rehab setting. Physiatrist is providing close team supervision and 24 hour management of active medical problems listed below. Physiatrist and rehab team continue to assess barriers to discharge/monitor patient progress toward functional and medical goals. FIM: FIM - Bathing Bathing Steps Patient Completed: Chest;Right Arm;Left Arm;Abdomen;Front perineal area;Right upper leg;Left upper leg;Left lower leg (including foot);Right lower leg (including foot) Bathing: 3: Mod-Patient completes 5-7 40f 10 parts or 50-74%  FIM - Upper Body Dressing/Undressing Upper body dressing/undressing steps patient completed: Thread/unthread right sleeve of pullover shirt/dresss;Pull shirt over trunk Upper body dressing/undressing: 3: Mod-Patient completed 50-74% of tasks FIM - Lower Body Dressing/Undressing Lower body dressing/undressing steps patient completed: Thread/unthread right underwear leg;Thread/unthread right pants leg Lower body dressing/undressing: 1: Total-Patient completed less than 25% of tasks  FIM - Toileting Toileting steps completed by patient: Performs perineal  hygiene Toileting: 2: Max-Patient completed 1 of 3 steps  FIM - Diplomatic Services operational officer Devices: Psychiatrist Transfers: 3-To toilet/BSC: Mod  A (lift or lower assist);3-From toilet/BSC: Mod A (lift or lower assist)  FIM - Bed/Chair Transfer Bed/Chair Transfer Assistive Devices: Arm rests Bed/Chair Transfer: 4: Supine > Sit: Min A (steadying Pt. > 75%/lift 1 leg);4: Sit > Supine: Min A (steadying pt. > 75%/lift 1 leg);3: Bed > Chair or W/C: Mod A (lift or lower assist);3: Chair or W/C > Bed: Mod A (lift or lower assist)  FIM - Locomotion: Wheelchair Distance: 150 Locomotion: Wheelchair: 5: Travels 150 ft or more: maneuvers on rugs and over door sills with supervision, cueing or coaxing FIM - Locomotion: Ambulation Locomotion: Ambulation Assistive Devices: Other (comment) (railing in hallway; orthosis) Ambulation/Gait Assistance: 2: Max assist Locomotion: Ambulation: 1: Travels less than 50 ft with maximal assistance (Pt: 25 - 49%)  Comprehension Comprehension Mode: Auditory Comprehension: 4-Understands basic 75 - 89% of the time/requires cueing 10 - 24% of the time  Expression Expression Mode: Verbal Expression: 5-Expresses complex 90% of the time/cues < 10% of the time  Social Interaction Social Interaction: 3-Interacts appropriately 50 - 74% of the time - May be physically or verbally inappropriate.  Problem Solving Problem Solving: 2-Solves basic 25 - 49% of the time - needs direction more than half the time to initiate, plan or complete simple activities  Memory Memory: 3-Recognizes or recalls 50 - 74% of the time/requires cueing 25 - 49% of the time  Medical Problem List and Plan:  1. Thromboembolic right MCA infarct laeft hemiparesis, combination of Takayasu's arteritis and hyper coagulable state. Plan team conference tomorrow 2. DVT Prophylaxis/Anticoagulation: IVC filter/ Xarelto.Lupus anticoagulant antibody positive , non compliant with  WarfarinMonitor for any signs of bleeding  3. Neuropsych: This patient is not capable of making decisions on his/her own behalf.  -will need constant positive reinforcement and education  -mother appears quite positive and supportive however  4. History of lupus anticoag Pos, no other clinical signs of SLE Continue prednisone for Takayasu's arteritis, CBGs look ok 5. MRSA positive nasal swab. Contact precautions and remains afebrile 6. History of medical noncompliance. Counseling  7. Microcytic anemia. Latest hemoglobin 9.4. Followup CBC as needed 8.  Chronic constipation add senna S- pt  inaccurate with bowel reports. Adjust bowel program as needed 9.  Leukocytosis on chronic steroid, afebrile LOS (Days) 11 A FACE TO FACE EVALUATION WAS PERFORMED  Yitty Roads E 03/04/2013, 9:21 AM

## 2013-03-05 ENCOUNTER — Inpatient Hospital Stay (HOSPITAL_COMMUNITY): Payer: Medicare Other | Admitting: *Deleted

## 2013-03-05 ENCOUNTER — Inpatient Hospital Stay (HOSPITAL_COMMUNITY): Payer: Medicare Other | Admitting: Occupational Therapy

## 2013-03-05 ENCOUNTER — Inpatient Hospital Stay (HOSPITAL_COMMUNITY): Payer: TRICARE For Life (TFL)

## 2013-03-05 ENCOUNTER — Inpatient Hospital Stay (HOSPITAL_COMMUNITY): Payer: Medicare Other | Admitting: Speech Pathology

## 2013-03-05 DIAGNOSIS — I69998 Other sequelae following unspecified cerebrovascular disease: Secondary | ICD-10-CM

## 2013-03-05 DIAGNOSIS — R209 Unspecified disturbances of skin sensation: Secondary | ICD-10-CM

## 2013-03-05 DIAGNOSIS — I6340 Cerebral infarction due to embolism of unspecified cerebral artery: Secondary | ICD-10-CM

## 2013-03-05 DIAGNOSIS — G8110 Spastic hemiplegia affecting unspecified side: Secondary | ICD-10-CM

## 2013-03-05 DIAGNOSIS — I69993 Ataxia following unspecified cerebrovascular disease: Secondary | ICD-10-CM

## 2013-03-05 MED ORDER — TEMAZEPAM 7.5 MG PO CAPS
15.0000 mg | ORAL_CAPSULE | Freq: Every day | ORAL | Status: DC
  Administered 2013-03-05 – 2013-03-11 (×7): 15 mg via ORAL
  Filled 2013-03-05 (×5): qty 2

## 2013-03-05 NOTE — Progress Notes (Signed)
Speech Language Pathology Daily Session Note  Patient Details  Name: [name] MRN: [id number] Date of Birth: [birth year]  Today's Date: 03/05/2013 Time: 0830-0930 Time Calculation (min): 60 min  Short Term Goals: Week 2: SLP Short Term Goal 1 (Week 2): Patient will initiate verbal communication with communication partner in response to questions with min assist clinician cues.  SLP Short Term Goal 2 (Week 2): Patient will attend to left visual field during a functional ADL with moderate clinician cues.  SLP Short Term Goal 3 (Week 2): Patient will sustain attention to basic functional ADL for 2 minutes with moderate clinician cues.  SLP Short Term Goal 4 (Week 2): Patient will demonstrate intellectual awareness of deficits post CVA by naming 2 physical and 1 cognitive change with moderate questions cues.  SLP Short Term Goal 5 (Week 2): Patient will utilize external aids to assist with recall of information with mod assist question cues  Skilled Therapeutic Interventions: Skilled treatment session focused on addressing dysphagia and cognitive goals during self-care tasks.  SLP facilitated session with min assist verbal cues to recall strategies for donning pants as well as transfer set-up.  SLP also facilitated session with max assist faded to mod assist verbal/question cues to label deficits post CVA.  Patient requested to work on functional math problems; SLP provided min verbal cues with addition and subtraction problems and max assist with multiplication, division or percentage problems due to decreased attention and working memory deficits.  SLP educated regarding use of organizational strategies and self-monitoring to assist.  Patient also consumed regular textures and thin liquids with cough x1, which SLP suspected was related to p.o. intake.  Patient required cues to attend to self-feeding; however, appeared safe up textures.  Recommend diet upgrade.    FIM:   Comprehension Comprehension Mode: Auditory Comprehension: 4-Understands basic 75 - 89% of the time/requires cueing 10 - 24% of the time Expression Expression Mode: Verbal Expression: 5-Expresses basic needs/ideas: With extra time/assistive device Social Interaction Social Interaction: 3-Interacts appropriately 50 - 74% of the time - May be physically or verbally inappropriate. Problem Solving Problem Solving: 2-Solves basic 25 - 49% of the time - needs direction more than half the time to initiate, plan or complete simple activities Memory Memory: 3-Recognizes or recalls 50 - 74% of the time/requires cueing 25 - 49% of the time  Pain Pain Assessment Pain Assessment: No/denies pain  Therapy/Group: Individual Therapy  Charlane Ferretti., CCC-SLP [phone]  Rylah Fukuda 03/05/2013, 4:05 PM

## 2013-03-05 NOTE — Progress Notes (Signed)
Physical Therapy Session Note  Patient Details  Name: [name] MRN: [id number] Date of Birth: [birth year]  Today's Date: 03/05/2013 Time: 1324-4010 Time Calculation (min): 30 min  Short Term Goals: Week 2:  PT Short Term Goal 1 (Week 2): Patient will perform bed mobility with min A,including rolling R and L and supine to sit with min A PT Short Term Goal 2 (Week 2): Patient will perform transfers with mod A from bed to w/c . PT Short Term Goal 3 (Week 2): Patient will propell wheelchair 200 feet with supervision and occasional cueing. PT Short Term Goal 4 (Week 2): patient will sit edge of bed or mat with supervision for 3 minutes  Skilled Therapeutic Interventions/Progress Updates:  Tx focused on NMR for LLE activation and strengthening.  Pt propelled WC 2x150' with hemi-technique and cues for upright posture. Stand-pivot transfers wWC<>mat with Min/Mod A and cues for technique and graded movement. Pt needs assist with safe LLE placement for transfers.  Demonstrated and instructed pt in anterior/posterior and lateral scooting edge of mat for increased L hip activation and controlled motion. Pt able to repeat instructions and technique, but difficulty isolating movements. Pt performed each direction x10 with UE assist and x10 without UE assist. Pt needs verbal cues in quiet environment for sustained attention to task.  Sit>sidelying>supine with Min A for LLE. Supine L heel slides and hip ABD/ADD with Maxi-slide x20 each with cues for controlled motion.  Pt left up in [facility] with lap belt and all needs in reach.       Therapy Documentation Precautions:  Precautions Precautions: Fall Precaution Comments: lethargic, leans to L; delayed processing; impulsive Restrictions Weight Bearing Restrictions: No General:   Vital Signs: Therapy Vitals Temp: 98.2 F (36.8 C) Temp src: Oral Pulse Rate: 100 Resp: 18 BP: 162/86 mmHg Patient Position, if appropriate: Sitting Oxygen  Therapy SpO2: 96 % O2 Device: None (Room air) Pain: Pain Assessment Pain Assessment: No/denies pain   See FIM for current functional status  Therapy/Group: Individual Therapy  Clydene Laming, PT, DPT   Eulogio Ditch, Quinnetta Roepke M 03/05/2013, 4:10 PM

## 2013-03-05 NOTE — Progress Notes (Signed)
Physical Therapy Session Note  Patient Details  Name: [name] MRN: [id number] Date of Birth: 26-Sep-1976  Today's Date: 03/05/2013 Time: 1100-1200 Time Calculation (min): 60 min  Short Term Goals: Week 2:  PT Short Term Goal 1 (Week 2): Patient will perform bed mobility with min A,including rolling R and L and supine to sit with min A PT Short Term Goal 2 (Week 2): Patient will perform transfers with mod A from bed to w/c . PT Short Term Goal 3 (Week 2): Patient will propell wheelchair 200 feet with supervision and occasional cueing. PT Short Term Goal 4 (Week 2): patient will sit edge of bed or mat with supervision for 3 minutes   Skilled Therapeutic Interventions/Progress Updates:   Treatment focusing on neuromuscular re-education via tactile and manual cues,VCs; bed mobility, transfers, gait, and w/c mobility.  W/c mobility using hemi technique without cues for route finding or safety; manipulation of L legrest with visual cues, VCs to remember it.  neuromuscular re-education for:  L hip swing and stance phases in:  -in sitting "walking" hips forward in preparation for transfers, and alternating hip flexion, knee extension  - in gait using hallway railing on R, x 20', wearing L AFO and shoe cover to decrease friction; attempted to use LPFRW, but pt was very distracted by wheels of RW and felt unstable -in bed mobility using L hip flexion/extension to position LLE during sit>< supine  Family ed with pt's mother for repetitive w/c>< bed squat pivot to L, stand pivot to R, with min assist.  Pt was extremely distracted by hallway noises, and reluctant at times to participate with her mother.  She finally stated that she was comfortable sitting EOB and did not want to return to w/c. After explanation that eating her lunch would be easier from the w/c instead of the bed, pt transferred back to w/c.    Therapy Documentation Precautions:  Precautions Precautions: Fall Precaution  Comments: lethargic, leans to L; delayed processing; impulsive Restrictions Weight Bearing Restrictions: No   Pain: Pain Assessment Pain Assessment: No/denies pain   Locomotion : Ambulation Ambulation/Gait Assistance: 3: Mod assist Wheelchair Mobility Wheelchair Parts Management: Supervision/cueing (cues for L brake, L legrest mechanism) Distance: 150     Other Treatments: Treatments Neuromuscular Facilitation: Left;Activity to increase motor control;Activity to increase timing and sequencing;Forced use;Lower Extremity;Activity to increase lateral weight shifting;Activity to increase anterior-posterior weight shifting;Activity to increase sustained activation  See FIM for current functional status  Therapy/Group: Individual Therapy  [name] 03/05/2013, 12:27 PM

## 2013-03-05 NOTE — Progress Notes (Signed)
Occupational Therapy Session Note  Patient Details  Name: [name] MRN: [id number] Date of Birth: 06/26/1976  Today's Date: 03/05/2013 Time: 1000-1100 Time Calculation (min): 60 min  Short Term Goals: Week 1:  OT Short Term Goal 1 (Week 1): Pt.  will maintain midline control for 5 minutes during grooming activity OT Short Term Goal 1 - Progress (Week 1): Met OT Short Term Goal 2 (Week 1): Pt. will perform grooming with mod assist OT Short Term Goal 2 - Progress (Week 1): Met OT Short Term Goal 3 (Week 1): Pt. will perform UB bathing with minimal assist and max with portural control OT Short Term Goal 3 - Progress (Week 1): Met OT Short Term Goal 4 (Week 1): Pt. will bathe LB with mod assist and max assist with sitting balance OT Short Term Goal 4 - Progress (Week 1): Met OT Short Term Goal 5 (Week 1): Pt. will dress UB with mod assist OT Short Term Goal 5 - Progress (Week 1): Met Week 2:  OT Short Term Goal 1 (Week 2): Pt will be able to transfer on and off a tub bench with mod assist. OT Short Term Goal 2 (Week 2): Pt will be able to maintain sitting balance on tub bench with min assist to demonstrate improved trunk control. OT Short Term Goal 3 (Week 2): Pt will demonstrate improved left side awareness by finding items on her left side with minimal cuing. OT Short Term Goal 4 (Week 2): Pt will transfer on and off toilet with min assist. OT Short Term Goal 5 (Week 2): Pt will be able to stand statically with min assist with RUE support to allow caregivers to assist her with clothing management.  Skilled Therapeutic Interventions/Progress Updates:       Pt seen for BADL retraining of bathing at sink level and dressing with a focus on attention to task, sit to stand at sink and standing balance. Pt seemed to have more difficulty with focus today and would often perseverate on certain tasks such as wringing out the wash cloth numerous times.  She needed more assist with dressing today as  she was having difficulty focusing but she did do well with sit to stand at sink with min assist.  She also stood with min to mod assist to be able to use RUE to pull pants up with min assist.   Pt then received 15 min of NMES to wrist/finger extensors with 35 pps, 10 sec on/ 20 sec off with intensity level 25.  Pt was able to achieve full extension and tolerated estim well. No active movement observed after treatment.  Pt went to her PT session.  Therapy Documentation Precautions:  Precautions Precautions: Fall Precaution Comments: lethargic, leans to L; delayed processing; impulsive Restrictions Weight Bearing Restrictions: No    Pain:  No C/o pain  ADL: See FIM for current functional status  Therapy/Group: Individual Therapy  Deon Duer 03/05/2013, 11:57 AM

## 2013-03-05 NOTE — Progress Notes (Signed)
Patient ID: [name], female   DOB: 1976-11-25, 37 y.o.   MRN: [id number] Subjective/Complaints: No New complaints 36y.o. 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 Admitted 02/16/2013 with complaints of headache and left-sided weakness with altered mental status. INR on admission of 1.14 with noted poor compliance on Coumadin. MRI of the brain showed acute infarct right MCA territory without hemorrhage as well as scattered small areas of acute infarct left frontal and parietal lobe. Patient did not receive TPA. Echocardiogram with ejection fraction of 60% without embolism. 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 There was a filling defect involving the innominate artery thought likely to represent thrombus. There was also occlusion of the right cavernous carotid and right M1 segment indicate of embolic phenomenon. Underwent ICA thrombectomy 02/16/2013 per interventional radiology. Patient had been placed on aspirin therapy per neurology services and Coumadin later resumed of which patient adamantly refused and patient was placed thus patient was placed on Xarelto for stroke prevention 02/20/2013  Discussed rationale for ritalin Discussed effect of CVA causing cognitive deficits in addition to L HP  Review of Systems  Neurological: Positive for dizziness and focal weakness.  All other systems reviewed and are negative.    Objective: Vital Signs: Blood pressure 114/57, pulse 85, temperature 98.4 F (36.9 C), temperature source Oral, resp. rate 18, weight 65.5 kg (144 lb 6.4 oz), last menstrual period 02/15/2013, SpO2 100.00%. No results found. No results found for this or any previous visit (from the past 72 hour(s)).   HEENT: normal Gen:  NAD Heart regular rate and  rhythm no rubs murmurs or exercise Abdomen positive bowel sounds soft nontender palpation Extremities no clubbing cyanosis or edema Musculoskeletal no shoulder pain with range of motion left side. Motor strength 5/5 in the right deltoid, biceps, triceps, grip, hip flexor, knee extensors, ankle dorsiflexor plantar flexor 0 in the left deltoid, biceps, triceps, grip 0. 2- minus in the hip flexor, knee extensor, ankle dorsiflexor, and toe flexor extensors Sensation is able to identify light touch in the left upper and left lower extremity as well as on the right side. Evidence of ataxia finger nose to finger testing on the right side Unable to do finger-nose-finger testing on the left side secondary to weakness Unable to cooperate with visual field testing +aprosodia  Assessment/Plan: 1. Functional deficits secondary to R MCA infarct which require 3+ hours per day of interdisciplinary therapy in a comprehensive inpatient rehab setting. Physiatrist is providing close team supervision and 24 hour management of active medical problems listed below. Physiatrist and rehab team continue to assess barriers to discharge/monitor patient progress toward functional and medical goals. FIM: FIM - Bathing Bathing Steps Patient Completed: Chest;Right Arm;Left Arm;Abdomen;Left upper leg;Right upper leg;Front perineal area;Right lower leg (including foot);Left lower leg (including foot) Bathing: 4: Min-Patient completes 8-9 7f 10 parts or 75+ percent  FIM - Upper Body Dressing/Undressing Upper body dressing/undressing steps patient completed: Thread/unthread right bra strap;Thread/unthread left bra strap;Put head through opening of pull over shirt/dress;Pull shirt over trunk;Thread/unthread right sleeve of pullover shirt/dresss Upper body dressing/undressing: 4: Min-Patient completed 75 plus % of tasks FIM - Lower Body Dressing/Undressing Lower body dressing/undressing steps patient completed: Thread/unthread  right underwear leg;Thread/unthread right pants leg Lower body dressing/undressing: 1: Total-Patient completed less than 25% of tasks  FIM - Interior and spatial designer  steps completed by patient: Performs perineal hygiene Toileting: 2: Max-Patient completed 1 of 3 steps  FIM - Diplomatic Services operational officer Devices: Bedside commode Toilet Transfers: 3-To toilet/BSC: Mod A (lift or lower assist);3-From toilet/BSC: Mod A (lift or lower assist)  FIM - Bed/Chair Transfer Bed/Chair Transfer Assistive Devices: Arm rests;Orthosis Bed/Chair Transfer: 4: Chair or W/C > Bed: Min A (steadying Pt. > 75%);4: Supine > Sit: Min A (steadying Pt. > 75%/lift 1 leg);4: Sit > Supine: Min A (steadying pt. > 75%/lift 1 leg)  FIM - Locomotion: Wheelchair Distance: 200 Locomotion: Wheelchair: 5: Travels 150 ft or more: maneuvers on rugs and over door sills with supervision, cueing or coaxing FIM - Locomotion: Ambulation Locomotion: Ambulation Assistive Devices: TEFL teacher;Walker - Rolling;Orthosis (L Allard AFO) Ambulation/Gait Assistance: 2: Max assist Locomotion: Ambulation: 1: Travels less than 50 ft with maximal assistance (Pt: 25 - 49%)  Comprehension Comprehension Mode: Auditory Comprehension: 4-Understands basic 75 - 89% of the time/requires cueing 10 - 24% of the time  Expression Expression Mode: Verbal Expression: 5-Expresses basic needs/ideas: With extra time/assistive device  Social Interaction Social Interaction: 3-Interacts appropriately 50 - 74% of the time - May be physically or verbally inappropriate.  Problem Solving Problem Solving: 2-Solves basic 25 - 49% of the time - needs direction more than half the time to initiate, plan or complete simple activities  Memory Memory: 3-Recognizes or recalls 50 - 74% of the time/requires cueing 25 - 49% of the time  Medical Problem List and Plan:  1. Thromboembolic right MCA infarct laeft hemiparesis, combination of Takayasu's  arteritis and hyper coagulable state. Plan team conference tomorrow 2. DVT Prophylaxis/Anticoagulation: IVC filter/ Xarelto.Lupus anticoagulant antibody positive , non compliant with WarfarinMonitor for any signs of bleeding  3. Neuropsych: This patient is not capable of making decisions on his/her own behalf.  -will need constant positive reinforcement and education  -mother appears quite positive and supportive however  4. History of lupus anticoag Pos, no other clinical signs of SLE Continue prednisone for Takayasu's arteritis, CBGs look ok 5. MRSA positive nasal swab. Contact precautions and remains afebrile 6. History of medical noncompliance. Counseling  7. Microcytic anemia. Latest hemoglobin 9.4. Followup CBC as needed 8.  Chronic constipation add senna S- pt  inaccurate with bowel reports. Adjust bowel program as needed 9.  Leukocytosis on chronic steroid, afebrile LOS (Days) 12 A FACE TO FACE EVALUATION WAS PERFORMED  Kimika Streater E 03/05/2013, 8:13 AM

## 2013-03-06 ENCOUNTER — Inpatient Hospital Stay (HOSPITAL_COMMUNITY): Payer: Medicare Other

## 2013-03-06 ENCOUNTER — Inpatient Hospital Stay (HOSPITAL_COMMUNITY): Payer: Medicare Other | Admitting: Occupational Therapy

## 2013-03-06 ENCOUNTER — Inpatient Hospital Stay (HOSPITAL_COMMUNITY): Payer: Medicare Other | Admitting: Speech Pathology

## 2013-03-06 ENCOUNTER — Inpatient Hospital Stay (HOSPITAL_COMMUNITY): Payer: Medicare Other | Admitting: Physical Therapy

## 2013-03-06 NOTE — Progress Notes (Signed)
Occupational Therapy Session Note  Patient Details  Name: [name] MRN: [id number] Date of Birth: 1976/05/15  Today's Date: 03/06/2013 Time: 1000-1100 Time Calculation (min): 60 min  Short Term Goals: Week 1:  OT Short Term Goal 1 (Week 1): Pt.  will maintain midline control for 5 minutes during grooming activity OT Short Term Goal 1 - Progress (Week 1): Met OT Short Term Goal 2 (Week 1): Pt. will perform grooming with mod assist OT Short Term Goal 2 - Progress (Week 1): Met OT Short Term Goal 3 (Week 1): Pt. will perform UB bathing with minimal assist and max with portural control OT Short Term Goal 3 - Progress (Week 1): Met OT Short Term Goal 4 (Week 1): Pt. will bathe LB with mod assist and max assist with sitting balance OT Short Term Goal 4 - Progress (Week 1): Met OT Short Term Goal 5 (Week 1): Pt. will dress UB with mod assist OT Short Term Goal 5 - Progress (Week 1): Met Week 2:  OT Short Term Goal 1 (Week 2): Pt will be able to transfer on and off a tub bench with mod assist. OT Short Term Goal 2 (Week 2): Pt will be able to maintain sitting balance on tub bench with min assist to demonstrate improved trunk control. OT Short Term Goal 3 (Week 2): Pt will demonstrate improved left side awareness by finding items on her left side with minimal cuing. OT Short Term Goal 4 (Week 2): Pt will transfer on and off toilet with min assist. OT Short Term Goal 5 (Week 2): Pt will be able to stand statically with min assist with RUE support to allow caregivers to assist her with clothing management.  Skilled Therapeutic Interventions/Progress Updates:    Pt scheduled for B/D but pt stated that she had bathed last night and was dressed prior to OT. Pt instead worked on one handed techniques for grooming with min assist.  Ted hose, toe off and shoes applied. Pt was taken to the gym for NMES (see details below) , trunk mobility to increase dynamic balance to the left, LUE weight bearing in  sitting, and transfer skills. Pt was able to reach RUE across midline to left and frequently would start to lose balance but would self correct using her trunk strength and demonstrating fast response time and RUE support.  She demonstrated improved attention to task, attention to LUE, and sit to stand skills.  At end of session, Pt placed back in wheelchair with quick release belt and lap belt and taken to room with call light in reach.  Therapy Documentation Precautions:  Precautions Precautions: Fall Precaution Comments: lethargic, leans to L; delayed processing; impulsive Restrictions Weight Bearing Restrictions: No    Pain: No c/o pain.     Other Treatments: Treatments Neuromuscular Facilitation: Left;Upper Extremity;Activity to increase lateral weight shifting Modalities Modalities: Archivist Stimulation Location: wrist extensor Electrical Stimulation Action: finger and wrist extensors Electrical Stimulation Parameters: 35 pps, 10 sec on. 20 sec off, intensity of 25 Electrical Stimulation Goals: Neuromuscular facilitation Weight Bearing Technique Weight Bearing Technique: Yes LUE Weight Bearing Technique: Extended arm seated  See FIM for current functional status  Therapy/Group: Individual Therapy  Shamya Macfadden 03/06/2013, 12:09 PM

## 2013-03-06 NOTE — Progress Notes (Signed)
Physical Therapy Note  Patient Details  Name: [name] MRN: [id number] Date of Birth: Aug 12, 1976 Today's Date: 03/06/2013  2:15 - 3:00 45 minutes Individual session. Patient denies pain.  Treatment session focused on facilitation of gait. Patient and therapist met with Thayer Ohm, prosthetist, to discuss possible need for left brace to assist with dorsiflexion and knee control. Patient ambulated 30 feet x 2 using railing in hallway and mod assist - once with no device and once with Allard toe off and wedge. Patient with more knee control and ability to progress left LE the second walk. Recommendation to use this for now and perhaps reassess closer to discharge (planned for 4/25) to see if any other changes need to be made as patient is showing daily progress in left LE strength and control. Patient ambulated with left platform rolling walker 40 feet with +2 assist (one to help control RW and 1 mod assist to facilitate left LE and weight shifting). Patient tended to have difficulty progressing left LE as it would hit the left wheel on the walker. Patient agreed to try hemi walker. Patient ambulated 40 feet with hemi walker and mod assist. Patient propelled wheelchair 150 feet back to room and was left with items in reach.    [name] 03/06/2013, 3:43 PM

## 2013-03-06 NOTE — Progress Notes (Signed)
Patient ID: [name], female   DOB: 1976-04-15, 37 y.o.   MRN: [id number] Subjective/Complaints: No New complaints 36y.o. 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 Admitted 02/16/2013 with complaints of headache and left-sided weakness with altered mental status. INR on admission of 1.14 with noted poor compliance on Coumadin. MRI of the brain showed acute infarct right MCA territory without hemorrhage as well as scattered small areas of acute infarct left frontal and parietal lobe. Patient did not receive TPA. Echocardiogram with ejection fraction of 60% without embolism. 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 There was a filling defect involving the innominate artery thought likely to represent thrombus. There was also occlusion of the right cavernous carotid and right M1 segment indicate of embolic phenomenon. Underwent ICA thrombectomy 02/16/2013 per interventional radiology. Patient had been placed on aspirin therapy per neurology services and Coumadin later resumed of which patient adamantly refused and patient was placed thus patient was placed on Xarelto for stroke prevention 02/20/2013  Discussed rationale for ritalin Pt asking about shoulder sling  Review of Systems  Neurological: Positive for dizziness and focal weakness.  All other systems reviewed and are negative.    Objective: Vital Signs: Blood pressure 142/72, pulse 98, temperature 98.3 F (36.8 C), temperature source Oral, resp. rate 18, weight 65.5 kg (144 lb 6.4 oz), last menstrual period 02/15/2013, SpO2 100.00%. No results found. No results found for this or any previous visit (from the past 72 hour(s)).   HEENT: normal Gen:  NAD Heart regular rate and rhythm no rubs murmurs or  exercise Abdomen positive bowel sounds soft nontender palpation Extremities no clubbing cyanosis or edema Musculoskeletal no shoulder pain with range of motion left side.+ subluxation Motor strength 5/5 in the right deltoid, biceps, triceps, grip, hip flexor, knee extensors, ankle dorsiflexor plantar flexor 0 in the left deltoid, biceps, triceps, grip 0. 2- minus in the hip flexor, knee extensor, ankle dorsiflexor, and toe flexor extensors Sensation is able to identify light touch in the left upper and left lower extremity as well as on the right side. 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 Functional deficits secondary to R MCA infarct which require 3+ hours per day of interdisciplinary therapy in a comprehensive inpatient rehab setting. Physiatrist is providing close team supervision and 24 hour management of active medical problems listed below. Physiatrist and rehab team continue to assess barriers to discharge/monitor patient progress toward functional and medical goals. FIM: FIM - Bathing Bathing Steps Patient Completed: Chest;Right Arm;Left Arm;Abdomen;Left upper leg;Right upper leg;Front perineal area;Right lower leg (including foot);Left lower leg (including foot) Bathing: 4: Min-Patient completes 8-9 44f 10 parts or 75+ percent  FIM - Upper Body Dressing/Undressing Upper body dressing/undressing steps patient completed: Thread/unthread right bra strap;Thread/unthread left bra strap;Put head through opening of pull over shirt/dress;Pull shirt over trunk;Thread/unthread right sleeve of pullover shirt/dresss Upper body dressing/undressing: 4: Min-Patient completed 75 plus % of tasks FIM - Lower Body Dressing/Undressing Lower body  dressing/undressing steps patient completed: Thread/unthread right underwear leg;Thread/unthread right pants leg Lower body dressing/undressing: 1: Total-Patient completed less than 25% of tasks  FIM - Toileting Toileting steps completed by patient: Adjust clothing prior to toileting Toileting: 2: Max-Patient completed 1 of 3 steps  FIM - Diplomatic Services operational officer Devices: Bedside commode Toilet Transfers: 3-To toilet/BSC: Mod A (lift or lower assist);3-From toilet/BSC: Mod A (lift or lower assist)  FIM - Bed/Chair Transfer Bed/Chair Transfer Assistive Devices: Arm rests;Orthosis Bed/Chair Transfer: 4: Supine > Sit: Min A (steadying Pt. > 75%/lift 1 leg);4: Sit > Supine: Min A (steadying pt. > 75%/lift 1 leg);3: Bed > Chair or W/C: Mod A (lift or lower assist);4: Chair or W/C > Bed: Min A (steadying Pt. > 75%)  FIM - Locomotion: Wheelchair Distance: 150 Locomotion: Wheelchair: 5: Travels 150 ft or more: maneuvers on rugs and over door sills with supervision, cueing or coaxing FIM - Locomotion: Ambulation Locomotion: Ambulation Assistive Devices: Other (comment) (hallway railing on R) Ambulation/Gait Assistance: 3: Mod assist Locomotion: Ambulation: 0: Activity did not occur  Comprehension Comprehension Mode: Auditory Comprehension: 4-Understands basic 75 - 89% of the time/requires cueing 10 - 24% of the time  Expression Expression Mode: Verbal Expression: 5-Expresses basic needs/ideas: With extra time/assistive device  Social Interaction Social Interaction Mode: Asleep Social Interaction: 3-Interacts appropriately 50 - 74% of the time - May be physically or verbally inappropriate.  Problem Solving Problem Solving: 2-Solves basic 25 - 49% of the time - needs direction more than half the time to initiate, plan or complete simple activities  Memory Memory: 3-Recognizes or recalls 50 - 74% of the time/requires cueing 25 - 49% of the time  Medical Problem List  and Plan:  1. Thromboembolic right MCA infarct laeft hemiparesis, combination of Takayasu's arteritis and hyper coagulable state. If pt starts ambulating more may need sling to control LUE 2. DVT Prophylaxis/Anticoagulation: IVC filter/ Xarelto.Lupus anticoagulant antibody positive , non compliant with WarfarinMonitor for any signs of bleeding  3. Neuropsych: This patient is not capable of making decisions on his/her own behalf.  -will need constant positive reinforcement and education  -mother appears quite positive and supportive however  4. History of lupus anticoag Pos, no other clinical signs of SLE Continue prednisone for Takayasu's arteritis, CBGs look ok 5. MRSA positive nasal swab. Contact precautions and remains afebrile 6. History of medical noncompliance. Counseling  7. Microcytic anemia. Latest hemoglobin 9.4. Followup CBC as needed 8.  Chronic constipation add senna S- pt  inaccurate with bowel reports. Adjust bowel program as needed 9.  Leukocytosis on chronic steroid, afebrile LOS (Days) 13 A FACE TO FACE EVALUATION WAS PERFORMED  [name] E 03/06/2013, 10:01 AM

## 2013-03-06 NOTE — Progress Notes (Signed)
Physical Therapy Note  Patient Details  Name: [name] MRN: [id number] Date of Birth: 06-Feb-1976 Today's Date: 03/06/2013  Time: 800-830 30 minutes  No c/o pain.  Treatment session focused on functional transfers and sit to stand training with focus on mid range knee and hip control.  Pt performed multiple sit to stand with pause and wt shifts at mid range with tactile cuing for glute and knee control.  Pre gait stepping with R LE with max A for balance and manual facilitation of L LE in stance phase. Pt requires mod manual facilitation and max verbal and visual cues to perform exercises, improved with repetition.  Pt limited by decreased sustained attention to task.  Individual therapy   Sindia Kowalczyk 03/06/2013, 8:37 AM

## 2013-03-06 NOTE — Progress Notes (Signed)
Speech Language Pathology Daily Session Note  Patient Details  Name: [name] MRN: [id number] Date of Birth: [birth year]  Today's Date: 03/06/2013 Time: 0830-0930 Time Calculation (min): 60 min  Short Term Goals: Week 2: SLP Short Term Goal 1 (Week 2): Patient will initiate verbal communication with communication partner in response to questions with min assist clinician cues.  SLP Short Term Goal 2 (Week 2): Patient will attend to left visual field during a functional ADL with moderate clinician cues.  SLP Short Term Goal 3 (Week 2): Patient will sustain attention to basic functional ADL for 2 minutes with moderate clinician cues.  SLP Short Term Goal 4 (Week 2): Patient will demonstrate intellectual awareness of deficits post CVA by naming 2 physical and 1 cognitive change with moderate questions cues.  SLP Short Term Goal 5 (Week 2): Patient will utilize external aids to assist with recall of information with mod assist question cues  Skilled Therapeutic Interventions: Skilled treatment session focused on addressing cognitive goals during self-care tasks.  SLP facilitated session with discussion regarding deficits post CVA with mod assist question cues for patient to identify 3 specific deficits.  SLP also facilitated session with min assist verbal cues to attend to left of environment during and sustain attention for 1-2 minutes during a structured activity.  Patient required total assist to complete moderately complex problem solving task due to impulsivity, decreased self-monitoring and correcting.  Continue with current plan of care.     FIM:  Comprehension Comprehension Mode: Auditory Comprehension: 4-Understands basic 75 - 89% of the time/requires cueing 10 - 24% of the time Expression Expression Mode: Verbal Expression: 5-Expresses basic needs/ideas: With extra time/assistive device Social Interaction Social Interaction: 3-Interacts appropriately 50 - 74% of the time - May be  physically or verbally inappropriate. Problem Solving Problem Solving: 2-Solves basic 25 - 49% of the time - needs direction more than half the time to initiate, plan or complete simple activities Memory Memory: 3-Recognizes or recalls 50 - 74% of the time/requires cueing 25 - 49% of the time  Pain Pain Assessment Pain Assessment: No/denies pain  Therapy/Group: Individual Therapy  Charlane Ferretti., CCC-SLP [phone]  [name] 03/06/2013, 4:58 PM

## 2013-03-07 ENCOUNTER — Inpatient Hospital Stay (HOSPITAL_COMMUNITY): Payer: Medicare Other | Admitting: Speech Pathology

## 2013-03-07 ENCOUNTER — Inpatient Hospital Stay (HOSPITAL_COMMUNITY): Payer: Medicare Other

## 2013-03-07 NOTE — Progress Notes (Signed)
Physical Therapy Session Note  Patient Details  Name: [name] MRN: [id number] Date of Birth: [birth year]  Today's Date: 03/07/2013 Time: 1010-1038 Time Calculation (min): 28 min  Short Term Goals: Week 2:  PT Short Term Goal 1 (Week 2): Patient will perform bed mobility with min A,including rolling R and L and supine to sit with min A PT Short Term Goal 2 (Week 2): Patient will perform transfers with mod A from bed to w/c . PT Short Term Goal 3 (Week 2): Patient will propell wheelchair 200 feet with supervision and occasional cueing. PT Short Term Goal 4 (Week 2): patient will sit edge of bed or mat with supervision for 3 minutes  Skilled Therapeutic Interventions/Progress Updates:  Pt was seen bedside in the am. Assisted pt to don L AFO and B shoes. Pt able to propel w/c 150 feet to rehab gym with R LE and verbal cues for encourage. Pt ambulated with hemiwalker and mod Ax1 for distances of 30 feet and 5 feet. Pt required verbal cues for sequencing and safety as well as tactile cues to assist with placement of L LE. Pt required verbal cues to remain focused on task at hand. While ambulating pt had tendency to reach for hand rail in the hall with loss of balance rather than attempting to utilize the hemiwalker. As pt fatigues tends to lean on therapist requiring verbal to correct.      Therapy Documentation Precautions:  Precautions Precautions: Fall Precaution Comments: lethargic, leans to L; delayed processing; impulsive Restrictions Weight Bearing Restrictions: No Pain: Pain Assessment Pain Assessment: No/denies pain  See FIM for current functional status  Therapy/Group: Individual Therapy  Rayford Halsted 03/07/2013, 12:15 PM

## 2013-03-07 NOTE — Progress Notes (Signed)
Patient ID: [name], female   DOB: [birth year], 37 y.o.   MRN: [id number] Subjective/Complaints: No New complaints 36y.o. 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 Admitted 02/16/2013 with complaints of headache and left-sided weakness with altered mental status. INR on admission of 1.14 with noted poor compliance on Coumadin. MRI of the brain showed acute infarct right MCA territory without hemorrhage as well as scattered small areas of acute infarct left frontal and parietal lobe. Patient did not receive TPA. Echocardiogram with ejection fraction of 60% without embolism. 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 There was a filling defect involving the innominate artery thought likely to represent thrombus. There was also occlusion of the right cavernous carotid and right M1 segment indicate of embolic phenomenon. Underwent ICA thrombectomy 02/16/2013 per interventional radiology. Patient had been placed on aspirin therapy per neurology services and Coumadin later resumed of which patient adamantly refused and patient was placed thus patient was placed on Xarelto for stroke prevention 02/20/2013  No specific complaints today. She is alert and denies any pain or headache. ROS: Patient denies headache, shortness of breath.  Objective: Vital Signs: Blood pressure 148/84, pulse 74, temperature 97.9 F (36.6 C), temperature source Oral, resp. rate 18, weight 144 lb 6.4 oz (65.5 kg), last menstrual period 02/15/2013, SpO2 100.00%. No results found. No results found for this or any previous visit (from the past 72 hour(s)).   Young female in no acute distress. HEENT exam: Atraumatic, normocephalic Chest: Clear to auscultation CV: Regular rate Abdominal exam: Active bowel  sounds, soft Extremities: No edema   Assessment/Plan: 1. Functional deficits secondary to R MCA infarct  Medical Problem List and Plan:  1. Thromboembolic right MCA infarct laeft hemiparesis, combination of Takayasu's arteritis and hyper coagulable state. If pt starts ambulating more may need sling to control LUE 2. DVT Prophylaxis/Anticoagulation: IVC filter/ Xarelto.Lupus anticoagulant antibody positive , non compliant with WarfarinMonitor for any signs of bleeding  3. Neuropsych: This patient is not capable of making decisions on his/her own behalf.  -will need constant positive reinforcement and education  -mother appears quite positive and supportive however  4. History of lupus anticoag Pos, no other clinical signs of SLE Continue prednisone for Takayasu's arteritis, CBGs look ok 5. MRSA positive nasal swab. Contact precautions and remains afebrile 6. History of medical noncompliance. Counseling  7. Microcytic anemia. Latest hemoglobin 9.4. Followup CBC as needed 8.  Chronic constipation add senna S- pt  inaccurate with bowel reports. Adjust bowel program as needed 9.  Leukocytosis on chronic steroid, afebrile LOS (Days) 14 A FACE TO FACE EVALUATION WAS PERFORMED  SWORDS,BRUCE HENRY 03/07/2013, 6:28 AM

## 2013-03-07 NOTE — Progress Notes (Signed)
Speech Language Pathology Daily Session Note  Patient Details  Name: [name] MRN: [id number] Date of Birth: 15-Dec-1975  Today's Date: 03/07/2013 Time: 0900-0930 Time Calculation (min): 30 min  Short Term Goals: Week 2: SLP Short Term Goal 1 (Week 2): Patient will initiate verbal communication with communication partner in response to questions with min assist clinician cues.  SLP Short Term Goal 2 (Week 2): Patient will attend to left visual field during a functional ADL with moderate clinician cues.  SLP Short Term Goal 3 (Week 2): Patient will sustain attention to basic functional ADL for 2 minutes with moderate clinician cues.  SLP Short Term Goal 4 (Week 2): Patient will demonstrate intellectual awareness of deficits post CVA by naming 2 physical and 1 cognitive change with moderate questions cues.  SLP Short Term Goal 5 (Week 2): Patient will utilize external aids to assist with recall of information with mod assist question cues  Skilled Therapeutic Interventions: Skilled treatment session focused on cognitive re-training.  She required min-mod verbal cues to attend to left environment during a structured activity and required min verbal cues to sustain attention to activity.   She was able to sustain attention to ADL function (teeth brushing) with minimal verbal/visual cues.  Continue with current treatment plan.   FIM:  Comprehension Comprehension: 4-Understands basic 75 - 89% of the time/requires cueing 10 - 24% of the time Expression Expression: 5-Expresses basic 90% of the time/requires cueing < 10% of the time. Social Interaction Social Interaction: 3-Interacts appropriately 50 - 74% of the time - May be physically or verbally inappropriate. Problem Solving Problem Solving: 3-Solves basic 50 - 74% of the time/requires cueing 25 - 49% of the time Memory Memory: 3-Recognizes or recalls 50 - 74% of the time/requires cueing 25 - 49% of the time  Pain Pain Assessment Pain  Assessment: No/denies pain  Therapy/Group: Individual Therapy  Lenny Pastel 03/07/2013, 11:29 AM

## 2013-03-07 NOTE — Progress Notes (Signed)
Subluxation to Left shoulder. No c/o pain. Physical therapy aware, kinesio tape applied.

## 2013-03-08 ENCOUNTER — Inpatient Hospital Stay (HOSPITAL_COMMUNITY): Payer: Medicare Other | Admitting: Physical Therapy

## 2013-03-08 NOTE — Progress Notes (Signed)
Physical Therapy Note  Patient Details  Name: CHEYANNE LAMISON MRN: [id number] Date of Birth: 1976/10/13 Today's Date: 03/08/2013  1115-1155 (40 minutes) individual Pain: no reported pain Focus of treatment: Neuro re-ed left knee extension in stance; gait training Treatment: pt up in wc upon arrival; gait 55 feet X 2 (followed by wc) with HW + ace wrap left ankle min/mod assist with vcing or tactile cues to facilitate left knee quad control in stance . Pt advances Left LE with ace wrap but continues with flexed knee in stance. Partial wall squats in stance with assist left knee extension. Pt returned to room with quick release belt applied.    Mando Blatz,JIM 03/08/2013, 11:59 AM

## 2013-03-08 NOTE — Progress Notes (Signed)
Patient ID: [name], female   DOB: 1976/04/30, 37 y.o.   MRN: [id number]  36y.o. 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 Admitted 02/16/2013 with complaints of headache and left-sided weakness with altered mental status. INR on admission of 1.14 with noted poor compliance on Coumadin. MRI of the brain showed acute infarct right MCA territory without hemorrhage as well as scattered small areas of acute infarct left frontal and parietal lobe. Patient did not receive TPA. Echocardiogram with ejection fraction of 60% without embolism. 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 There was a filling defect involving the innominate artery thought likely to represent thrombus. There was also occlusion of the right cavernous carotid and right M1 segment indicate of embolic phenomenon. Underwent ICA thrombectomy 02/16/2013 per interventional radiology. Patient had been placed on aspirin therapy per neurology services and Coumadin later resumed of which patient adamantly refused and patient was placed thus patient was placed on Xarelto for stroke prevention 02/20/2013 Subjective/Complaints: She feels well this morning. No specific complaints. ROS: Patient denies headache, shortness of breath, abdominal pain. Her appetite is good. Objective: Vital Signs: Blood pressure 119/73, pulse 70, temperature 97.8 F (36.6 C), temperature source Oral, resp. rate 20, weight 144 lb 6.4 oz (65.5 kg), last menstrual period 02/15/2013, SpO2 98.00%.  Young female in no acute distress. HEENT exam: Atraumatic, normocephalic Chest: Clear to auscultation CV: Regular rate Abdominal exam: Active bowel sounds, soft Extremities: No edema   Assessment/Plan: 1. Functional deficits secondary to R MCA  infarct  Medical Problem List and Plan:  1. Thromboembolic right MCA infarct laeft hemiparesis, combination of Takayasu's arteritis and hyper coagulable state.  2. DVT Prophylaxis/Anticoagulation: IVC filter/ Xarelto.Lupus anticoagulant antibody positive , non compliant with Warfarin. No current signs of bleeding. 3. Neuropsych: This patient is not capable of making decisions on his/her own behalf.  -will need constant positive reinforcement and education  -mother appears quite positive and supportive however  4. History of lupus anticoag Pos, no other clinical signs of SLE Continue prednisone for Takayasu's arteritis, CBGs look ok 5. MRSA positive nasal swab. Contact precautions and remains afebrile 6. History of medical noncompliance.  7. Microcytic anemia. Latest hemoglobin 9.4. Followup CBC as needed 8.  Chronic constipation add senna S- pt  inaccurate with bowel reports. Adjust bowel program as needed 9.  Leukocytosis on chronic steroid, afebrile. No other signs of infection. LOS (Days) 15 A FACE TO FACE EVALUATION WAS PERFORMED  Albeiro Trompeter HENRY 03/08/2013, 6:43 AM

## 2013-03-09 ENCOUNTER — Inpatient Hospital Stay (HOSPITAL_COMMUNITY): Payer: TRICARE For Life (TFL)

## 2013-03-09 ENCOUNTER — Inpatient Hospital Stay (HOSPITAL_COMMUNITY): Payer: Medicare Other | Admitting: Occupational Therapy

## 2013-03-09 ENCOUNTER — Inpatient Hospital Stay (HOSPITAL_COMMUNITY): Payer: Medicare Other | Admitting: Speech Pathology

## 2013-03-09 NOTE — Progress Notes (Signed)
Physical Therapy Weekly Progress Note  Patient Details  Name: [name] MRN: [id number] Date of Birth: Jun 12, 1976  Today's Date: 03/09/2013 Time: 4098-1191 Time Calculation (min): 30 min  Patient has met 4 of 4 short term goals.    Patient continues to demonstrate the following deficits: LLE and LUE motor control, LLE sustained muscle activation, balance, attention, awareness, problem solving and dependence for functional mobility and therefore will continue to benefit from skilled PT intervention to enhance overall performance with activity tolerance, balance, postural control, ability to compensate for deficits, functional use of  left upper extremity and left lower extremity, attention and awareness.  Patient progressing toward long term goals..  Continue plan of care. Pt and family have decided that pt will discharge to mother's home which is one level, with 3-4 STE with L rail, per pt.  PT Short Term Goals Week 2:  PT Short Term Goal 1 (Week 2): Patient will perform bed mobility with min A,including rolling R and L and supine to sit with min A PT Short Term Goal 1 - Progress (Week 2): Met PT Short Term Goal 2 (Week 2): Patient will perform transfers with mod A from bed to w/c . PT Short Term Goal 2 - Progress (Week 2): Met PT Short Term Goal 3 (Week 2): Patient will propell wheelchair 200 feet with supervision and occasional cueing. PT Short Term Goal 3 - Progress (Week 2): Met PT Short Term Goal 4 (Week 2): patient will sit edge of bed or mat with supervision for 3 minutes PT Short Term Goal 4 - Progress (Week 2): Met     Skilled Therapeutic Interventions/Progress Updates:  1st tx-  Treatment focused on w/c mobility, sit> stand, trunk righting, stairs and gait.  Pt demonstrates increased processing time, difficulty with multi-step directions.  W/c mobility using hemi technique x 200' x 2 modified independent in controlled env on straight path. In room, supervision for obstacle  negotiation and placement. Pt needs visual, Vcs for L brake and L legrest manipulation.  neuromuscular re-education via cueing, demo, manual cues for:  Sit> stand with min assist; up/down 5 steps with R rail with mod> max assist, for L LE stance stability and mod cues for technique.  Gait with HW x 25' with mod/max assist for L stance stability, sequencing.  Trunk shortening/lengthening in sitting for trunk righting reactions.   2nd tx-  Pt asleep in bed. Treatment focused on neuromuscular re-education L trunk and LLE via tactile cues, manual cues, VCs and visual feedback. Pt performed AA L heel slides, bil lower trunk rotation with adduction, bil bridging, bil hip internal rotation, isolated knee ext/flex, abdominal crunches; in R sidelying performed AA L hip extension, knee flexion/ext.  2-/5 knee flexion/extension noted today.  At end of session, bed alarm activated and all needs within reach.    Therapy Documentation Precautions:  Precautions Precautions: Fall Precaution Comments: lethargic, leans to L; delayed processing; impulsive Restrictions Weight Bearing Restrictions: No   Pain: Pain Assessment Pain Assessment: No/denies pain Mobility: Transfers Sit to Stand Details: Verbal cues for technique Stand to Sit: 4: Min assist Squat Pivot Transfers: 4: Min assist (to L; mat> w/c) Locomotion : Ambulation Ambulation/Gait Assistance: 2: Max assist Gait Gait: Yes Gait Pattern: Left genu recurvatum;Decreased weight shift to left;Decreased hip/knee flexion - left;Decreased step length - left;Lateral hip instability Stairs / Additional Locomotion Stairs: Yes Stairs Assistance: 2: Max Estate agent Assistance Details: Verbal cues for technique;Verbal cues for sequencing;Manual facilitation for weight bearing Stair Management  Technique: One rail Right Number of Stairs: 5 Wheelchair Mobility Wheelchair Mobility: Yes Wheelchair Assistance: 5: Supervision Wheelchair Propulsion: Right  upper extremity;Right lower extremity Wheelchair Parts Management: Supervision/cueing;Needs assistance Distance: 200    Other Treatments: Treatments Neuromuscular Facilitation: Left;Lower Extremity;Upper Extremity;Activity to increase motor control;Activity to increase sustained activation;Activity to increase timing and sequencing;Activity to increase grading;Activity to increase lateral weight shifting;Activity to increase anterior-posterior weight shifting  See FIM for current functional status  Therapy/Group: Individual Therapy  Avian Konigsberg 03/09/2013, 3:00 PM

## 2013-03-09 NOTE — Progress Notes (Signed)
Occupational Therapy Session Note  Patient Details  Name: [name] MRN: [id number] Date of Birth: Feb 02, 1976  Today's Date: 03/09/2013 Time: 1000-1100 Time Calculation (min): 60 min  Short Term Goals: Week 1:  OT Short Term Goal 1 (Week 1): Pt.  will maintain midline control for 5 minutes during grooming activity OT Short Term Goal 1 - Progress (Week 1): Met OT Short Term Goal 2 (Week 1): Pt. will perform grooming with mod assist OT Short Term Goal 2 - Progress (Week 1): Met OT Short Term Goal 3 (Week 1): Pt. will perform UB bathing with minimal assist and max with portural control OT Short Term Goal 3 - Progress (Week 1): Met OT Short Term Goal 4 (Week 1): Pt. will bathe LB with mod assist and max assist with sitting balance OT Short Term Goal 4 - Progress (Week 1): Met OT Short Term Goal 5 (Week 1): Pt. will dress UB with mod assist OT Short Term Goal 5 - Progress (Week 1): Met Week 2:  OT Short Term Goal 1 (Week 2): Pt will be able to transfer on and off a tub bench with mod assist. OT Short Term Goal 2 (Week 2): Pt will be able to maintain sitting balance on tub bench with min assist to demonstrate improved trunk control. OT Short Term Goal 3 (Week 2): Pt will demonstrate improved left side awareness by finding items on her left side with minimal cuing. OT Short Term Goal 4 (Week 2): Pt will transfer on and off toilet with min assist. OT Short Term Goal 5 (Week 2): Pt will be able to stand statically with min assist with RUE support to allow caregivers to assist her with clothing management.  Skilled Therapeutic Interventions/Progress Updates:    Pt seen for BADL retraining of B/D at shower level.  At start of session, pt was quite emotional about her son not visiting this weekend.  Pt was able to express her feeling and move on to prepare for session by selecting her clothing from the drawers.  Pt completed grooming at sink and then transferred to tub bench with grab bars from w/c  with stand pivot with min assist. On tub bench, pt is demonstrating a great deal of trunk control.  She can weight shift onto her left hip to be able to wash her bottom with RUE without losing her balance.  She was able to bathe self using AE.  To don LB clothing, pt crosses left foot over right knee. She then stands with min assist and is able to pull pants up over hips.  Pt's PT had arrived for patient's next session.  Therapy Documentation Precautions:  Precautions Precautions: Fall Precaution Comments: lethargic, leans to L; delayed processing; impulsive Restrictions Weight Bearing Restrictions: No   Pain: Pain Assessment Pain Assessment: No/denies pain ADL: See FIM for current functional status  Therapy/Group: Individual Therapy  Vanesa Renier 03/09/2013, 11:58 AM

## 2013-03-09 NOTE — Progress Notes (Signed)
Patient ID: [name], female   DOB: 08-Dec-1975, 37 y.o.   MRN: [id number] Subjective/Complaints: No New complaints 36y.o. 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 Admitted 02/16/2013 with complaints of headache and left-sided weakness with altered mental status. INR on admission of 1.14 with noted poor compliance on Coumadin. MRI of the brain showed acute infarct right MCA territory without hemorrhage as well as scattered small areas of acute infarct left frontal and parietal lobe. Patient did not receive TPA. Echocardiogram with ejection fraction of 60% without embolism. 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 There was a filling defect involving the innominate artery thought likely to represent thrombus. There was also occlusion of the right cavernous carotid and right M1 segment indicate of embolic phenomenon. Underwent ICA thrombectomy 02/16/2013 per interventional radiology. Patient had been placed on aspirin therapy per neurology services and Coumadin later resumed of which patient adamantly refused and patient was placed thus patient was placed on Xarelto for stroke prevention 02/20/2013  No pain c/o Moving LLE better  Review of Systems  Neurological: Positive for dizziness and focal weakness.  All other systems reviewed and are negative.    Objective: Vital Signs: Blood pressure 131/66, pulse 80, temperature 98.1 F (36.7 C), temperature source Oral, resp. rate 18, weight 65.5 kg (144 lb 6.4 oz), last menstrual period 02/15/2013, SpO2 95.00%. No results found. No results found for this or any previous visit (from the past 72 hour(s)).   HEENT: normal Gen:  NAD Heart regular rate and rhythm no rubs murmurs or exercise Abdomen positive bowel sounds soft  nontender palpation Extremities no clubbing cyanosis or edema Musculoskeletal no shoulder pain with range of motion left side.+ subluxation Motor strength 5/5 in the right deltoid, biceps, triceps, grip, hip flexor, knee extensors, ankle dorsiflexor plantar flexor 0 in the left deltoid, biceps, triceps, grip 0. 2- minus in the hip flexor, knee extensor, ankle dorsiflexor, and toe flexor extensors Sensation is able to identify light touch in the left upper and left lower extremity as well as on the right side. 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 Functional deficits secondary to R MCA infarct which require 3+ hours per day of interdisciplinary therapy in a comprehensive inpatient rehab setting. Physiatrist is providing close team supervision and 24 hour management of active medical problems listed below. Physiatrist and rehab team continue to assess barriers to discharge/monitor patient progress toward functional and medical goals. FIM: FIM - Bathing Bathing Steps Patient Completed: Chest;Right Arm;Left Arm;Abdomen;Front perineal area;Buttocks;Right upper leg;Left upper leg (shower) Bathing: 5: Set-up assist to: Adjust water temp  FIM - Upper Body Dressing/Undressing Upper body dressing/undressing steps patient completed: Thread/unthread right bra strap;Thread/unthread left bra strap;Thread/unthread left sleeve of pullover shirt/dress Upper body dressing/undressing: 1: Total-Patient completed less than 25% of tasks FIM - Lower Body Dressing/Undressing Lower body dressing/undressing steps patient completed: Thread/unthread right underwear leg;Thread/unthread right pants leg Lower body dressing/undressing: 1: Two helpers  FIM -  Toileting Toileting steps completed by patient: Performs perineal hygiene Toileting Assistive Devices: Grab bar or rail for support Toileting: 2: Max-Patient completed 1 of 3 steps  FIM - Diplomatic Services operational officer Devices: Grab bars Toilet Transfers: 3-To toilet/BSC: Mod A (lift or lower assist);3-From toilet/BSC: Mod A (lift or lower assist)  FIM - Bed/Chair Transfer Bed/Chair Transfer Assistive Devices: Bed rails Bed/Chair Transfer: 3: Bed > Chair or W/C: Mod A (lift or lower assist)  FIM - Locomotion: Wheelchair Distance: 150 Locomotion: Wheelchair: 5: Travels 150 ft or more: maneuvers on rugs and over door sills with supervision, cueing or coaxing FIM - Locomotion: Ambulation Locomotion: Ambulation Assistive Devices: Chief Operating Officer Ambulation/Gait Assistance: 3: Mod assist Locomotion: Ambulation: 1: Travels less than 50 ft with moderate assistance (Pt: 50 - 74%)  Comprehension Comprehension Mode: Auditory Comprehension: 4-Understands basic 75 - 89% of the time/requires cueing 10 - 24% of the time  Expression Expression Mode: Verbal Expression: 5-Expresses basic 90% of the time/requires cueing < 10% of the time.  Social Interaction Social Interaction Mode: Asleep Social Interaction: 4-Interacts appropriately 75 - 89% of the time - Needs redirection for appropriate language or to initiate interaction.  Problem Solving Problem Solving: 4-Solves basic 75 - 89% of the time/requires cueing 10 - 24% of the time  Memory Memory: 4-Recognizes or recalls 75 - 89% of the time/requires cueing 10 - 24% of the time  Medical Problem List and Plan:  1. Thromboembolic right MCA infarct laeft hemiparesis, combination of Takayasu's arteritis and hyper coagulable state. If pt starts ambulating more may need sling to control LUE 2. DVT Prophylaxis/Anticoagulation: IVC filter/ Xarelto.Lupus anticoagulant antibody positive , non compliant with WarfarinMonitor for any signs of  bleeding  3. Neuropsych: This patient is not capable of making decisions on his/her own behalf.  -will need constant positive reinforcement and education  -mother appears quite positive and supportive however  4. History of lupus anticoag Pos, no other clinical signs of SLE Continue prednisone for Takayasu's arteritis, CBGs look ok 5. MRSA positive nasal swab. Contact precautions and remains afebrile 6. History of medical noncompliance. Counseling  7. Microcytic anemia. Latest hemoglobin 9.4. Followup CBC as needed 8.  Chronic constipation add senna S- pt  inaccurate with bowel reports. Adjust bowel program as needed 9.  Leukocytosis on chronic steroid, afebrile LOS (Days) 16 A FACE TO FACE EVALUATION WAS PERFORMED  KIRSTEINS,ANDREW E 03/09/2013, 9:40 AM

## 2013-03-09 NOTE — Progress Notes (Signed)
Speech Language Pathology Daily Session Note  Patient Details  Name: [name] MRN: [id number] Date of Birth: [birth year]  Today's Date: 03/09/2013 Time: 0915-1000 Time Calculation (min): 45 min  Short Term Goals: Week 2: SLP Short Term Goal 1 (Week 2): Patient will initiate verbal communication with communication partner in response to questions with min assist clinician cues.  SLP Short Term Goal 2 (Week 2): Patient will attend to left visual field during a functional ADL with moderate clinician cues.  SLP Short Term Goal 3 (Week 2): Patient will sustain attention to basic functional ADL for 2 minutes with moderate clinician cues.  SLP Short Term Goal 4 (Week 2): Patient will demonstrate intellectual awareness of deficits post CVA by naming 2 physical and 1 cognitive change with moderate questions cues.  SLP Short Term Goal 5 (Week 2): Patient will utilize external aids to assist with recall of information with mod assist question cues  Skilled Therapeutic Interventions: Skilled treatment session focused on addressing cognitive skills during various tasks.  SLP facilitated session with max verbal cues and encouragement to initiate sitting up to edge of bed; SLP also provided in physical assist to [facility] left lower extremity.  Patient required mod verbal cues to attend to left environment during a structured activity.  Patient required max faded to mod assist verbal and visual cues to self-monitor and correct perseverative errors.     FIM:  Comprehension Comprehension Mode: Auditory Comprehension: 4-Understands basic 75 - 89% of the time/requires cueing 10 - 24% of the time Expression Expression Mode: Verbal Expression: 5-Expresses basic 90% of the time/requires cueing < 10% of the time. Social Interaction Social Interaction: 4-Interacts appropriately 75 - 89% of the time - Needs redirection for appropriate language or to initiate interaction. Problem Solving Problem Solving:  4-Solves basic 75 - 89% of the time/requires cueing 10 - 24% of the time Memory Memory: 4-Recognizes or recalls 75 - 89% of the time/requires cueing 10 - 24% of the time  Pain Pain Assessment Pain Assessment: No/denies pain  Therapy/Group: Individual Therapy  Charlane Ferretti., CCC-SLP [phone]  [name] 03/09/2013, 10:12 AM

## 2013-03-09 NOTE — Progress Notes (Signed)
Physical Therapy Session Note  Patient Details  Name: [name] MRN: [id number] Date of Birth: 01/03/1976  Today's Date: 03/09/2013 Time: 1000-1100 Time Calculation (min): 60 min  Short Term Goals: Week 2:  PT Short Term Goal 1 (Week 2): Patient will perform bed mobility with min A,including rolling R and L and supine to sit with min A PT Short Term Goal 2 (Week 2): Patient will perform transfers with mod A from bed to w/c . PT Short Term Goal 3 (Week 2): Patient will propell wheelchair 200 feet with supervision and occasional cueing. PT Short Term Goal 4 (Week 2): patient will sit edge of bed or mat with supervision for 3 minutes Week 3:     Skilled Therapeutic Interventions/Progress Updates:  1st tx- Treatment focused on w/c mobility, sit> stand, trunk righting, stairs and gait.  W/c mobility using hemi technique x 200' x 2 modified independent in controlled env on straight path.  In room, supervision for obstacle negotiation and placement.  Pt needs visual, Vcs for L brake and L legrest manipulation.  neuromuscular re-education via cueing, demo, manual cues for: Sit> stand with min assist; up/down 5 steps with R rail with mod> max assist, for L LE stance stability and mod cues for technique.  Gait with HW x 25' with mod/max assist for L stance stability, sequencing. Trunk shortening/lengthening in sitting for trunk righting reactions   2nd tx- Pt asleep in bed.  Treatment focused on neuromuscular re-education L trunk and LLE via tactile cues, manual cues, VCs and visual feedback.  Pt performed AA L heel slides, bil lower trunk rotation with adduction, bil bridging, bil hip internal rotation, isolated knee ext/flex, abdominal crunches; in R sidelying performed AA L hip extension, knee flexion/ext.    2-/5 knee flexion/extension noted today.  At end of session, bed alarm activated and all needs within reach.   Therapy Documentation Precautions:  Precautions Precautions:  Fall Precaution Comments: lethargic, leans to L; delayed processing; impulsive Restrictions Weight Bearing Restrictions: No Pain: Pain Assessment Pain Assessment: No/denies pain Mobility: Transfers Sit to Stand Details: Verbal cues for technique Stand to Sit: 4: Min assist Squat Pivot Transfers: 4: Min assist (to L; mat> w/c) Locomotion : Gait Gait: Yes Gait Pattern: Left genu recurvatum;Decreased weight shift to left;Decreased hip/knee flexion - left;Decreased step length - left;Lateral hip instability Stairs / Additional Locomotion Stairs: Yes Stairs Assistance: 2: Max Estate agent Assistance Details: Verbal cues for technique;Verbal cues for sequencing;Manual facilitation for weight bearing Stair Management Technique: One rail Right Number of Stairs: 5 Wheelchair Mobility Wheelchair Mobility: Yes Wheelchair Assistance: 5: Supervision Wheelchair Propulsion: Right upper extremity;Right lower extremity Wheelchair Parts Management: Supervision/cueing;Needs assistance to look to L for brake and L legrest mechanism Distance: 200    Other Treatments: Treatments Neuromuscular Facilitation: Left;Lower Extremity;Upper Extremity;Activity to increase motor control;Activity to increase sustained activation;Activity to increase timing and sequencing;Activity to increase grading;Activity to increase lateral weight shifting;Activity to increase anterior-posterior weight shifting  See FIM for current functional status  Therapy/Group: Individual Therapy  Edmundo Tedesco 03/09/2013, 12:20 PM

## 2013-03-09 NOTE — Plan of Care (Signed)
Problem: RH BLADDER ELIMINATION Goal: RH STG MANAGE BLADDER WITH ASSISTANCE STG Manage Bladder With MOD I Assistance  Outcome: Progressing Patient is attempting to use female urinal.

## 2013-03-10 ENCOUNTER — Inpatient Hospital Stay (HOSPITAL_COMMUNITY): Payer: Medicare Other

## 2013-03-10 ENCOUNTER — Inpatient Hospital Stay (HOSPITAL_COMMUNITY): Payer: Medicare Other | Admitting: Speech Pathology

## 2013-03-10 ENCOUNTER — Inpatient Hospital Stay (HOSPITAL_COMMUNITY): Payer: Medicare Other | Admitting: Occupational Therapy

## 2013-03-10 DIAGNOSIS — I6340 Cerebral infarction due to embolism of unspecified cerebral artery: Secondary | ICD-10-CM

## 2013-03-10 DIAGNOSIS — I69993 Ataxia following unspecified cerebrovascular disease: Secondary | ICD-10-CM

## 2013-03-10 DIAGNOSIS — I69998 Other sequelae following unspecified cerebrovascular disease: Secondary | ICD-10-CM

## 2013-03-10 DIAGNOSIS — R209 Unspecified disturbances of skin sensation: Secondary | ICD-10-CM

## 2013-03-10 DIAGNOSIS — G8110 Spastic hemiplegia affecting unspecified side: Secondary | ICD-10-CM

## 2013-03-10 LAB — CBC
HCT: 32.7 % — ABNORMAL LOW (ref 36.0–46.0)
Hemoglobin: 10.7 g/dL — ABNORMAL LOW (ref 12.0–15.0)
RDW: 18.4 % — ABNORMAL HIGH (ref 11.5–15.5)
WBC: 18.3 10*3/uL — ABNORMAL HIGH (ref 4.0–10.5)

## 2013-03-10 NOTE — Progress Notes (Signed)
Speech Language Pathology Daily Session Note & Weekly Progress Note  Patient Details  Name: [name] MRN: [id number] Date of Birth: 03/09/1976  Today's Date: 03/10/2013 Time: 0918-1000 Time Calculation (min): 45 min  Short Term Goals: Week 2: SLP Short Term Goal 1 (Week 2): Patient will initiate verbal communication with communication partner in response to questions with min assist clinician cues.  SLP Short Term Goal 2 (Week 2): Patient will attend to left visual field during a functional ADL with moderate clinician cues.  SLP Short Term Goal 3 (Week 2): Patient will sustain attention to basic functional ADL for 2 minutes with moderate clinician cues.  SLP Short Term Goal 4 (Week 2): Patient will demonstrate intellectual awareness of deficits post CVA by naming 2 physical and 1 cognitive change with moderate questions cues.  SLP Short Term Goal 5 (Week 2): Patient will utilize external aids to assist with recall of information with mod assist question cues  Skilled Therapeutic Interventions: Skilled treatment session focused on addressing cognitive skills during various tasks.  SLP facilitated session with set-up assist and min assist verbal cues to problem solve preparing coffee.  SLP also facilitated session with task of locating listed items in a sales add and estimating cost of items; patient required mod assist cues to slowly scan through pages and refer back to list to assist with recalling items.  Patient required min assist cues to estimate cost of items and calculate total.  Patient initiated discussion regarding current deficits as well as anticipating needs at and after discharge with overall mod cues to accurately predict impact of deficits.    FIM:  Comprehension Comprehension Mode: Auditory Comprehension: 5-Understands basic 90% of the time/requires cueing < 10% of the time Expression Expression Mode: Verbal Expression: 5-Expresses complex 90% of the time/cues < 10% of  the time Social Interaction Social Interaction: 4-Interacts appropriately 75 - 89% of the time - Needs redirection for appropriate language or to initiate interaction. Problem Solving Problem Solving: 3-Solves basic 50 - 74% of the time/requires cueing 25 - 49% of the time Memory Memory: 4-Recognizes or recalls 75 - 89% of the time/requires cueing 10 - 24% of the time FIM - Eating Eating Activity: 5: Set-up assist for open containers  Pain Pain Assessment Pain Assessment: No/denies pain  Therapy/Group: Individual Therapy   Speech Language Pathology Weekly Progress Note  Patient Details  Name: [name] MRN: [id number] Date of Birth: 01-19-1976  Today's Date: 03/10/2013  Short Term Goals: Week 2: SLP Short Term Goal 1 (Week 2): Patient will initiate verbal communication with communication partner in response to questions with min assist clinician cues.  SLP Short Term Goal 1 - Progress (Week 2): Met SLP Short Term Goal 2 (Week 2): Patient will attend to left visual field during a functional ADL with moderate clinician cues.  SLP Short Term Goal 2 - Progress (Week 2): Met SLP Short Term Goal 3 (Week 2): Patient will sustain attention to basic functional ADL for 2 minutes with moderate clinician cues.  SLP Short Term Goal 3 - Progress (Week 2): Met SLP Short Term Goal 4 (Week 2): Patient will demonstrate intellectual awareness of deficits post CVA by naming 2 physical and 1 cognitive change with moderate questions cues.  SLP Short Term Goal 4 - Progress (Week 2): Met SLP Short Term Goal 5 (Week 2): Patient will utilize external aids to assist with recall of information with mod assist question cues SLP Short Term Goal 5 - Progress (Week  2): Met Week 3: SLP Short Term Goal 1 (Week 3): Patient will attend to left visual field during a functional task or conversation with min clinician cues.  SLP Short Term Goal 2 (Week 3): Patient will sustain attention to basic functional ADL for 5  minutes with mod clinician cues.  SLP Short Term Goal 3 (Week 3): Patient will demonstrate emergent awareness of deficits by requesting help as needed with moderate questions cues.  SLP Short Term Goal 4 (Week 3): Patient will self-monitor and correct errors with functional tasks with moderate question cues.  SLP Short Term Goal 5 (Week 3): Patient will read passages with intiation with mod assist clinician cues.  Weekly Progress Updates: Patient met 5 out of 5 short term objectives this reporting period due to gains in initiation of verbal expression, sustained attention to tasks, awareness of deficits, left attention and use of external aids to assist with recall.  Patient's willingness to participate, ability to accept and attempt to utilize feedback from clinician has increased her ability to compensate for cognitive deficits.  Patient requires moderate assist with most tasks at this time and as a result, it is recommended that this patient receive skilled SLP service to continue to address these deficits in functional activities to maximize functional independence and reduce burden of care prior to discharge.      SLP Intensity: Minumum of 1-2 x/day, 30 to 90 minutes SLP Frequency: 5 out of 7 days SLP Duration/Estimated Length of Stay: anticipated dischrage 4/25 SLP Treatment/Interventions: Cognitive remediation/compensation;Cueing hierarchy;Environmental controls;Functional tasks;Internal/external aids;Medication managment;Patient/family education;Speech/Language facilitation;Therapeutic Activities  Charlane Ferretti., CCC-SLP [phone]  Leroi Haque 03/10/2013, 11:33 AM

## 2013-03-10 NOTE — Progress Notes (Signed)
Physical Therapy Note  Patient Details  Name: [name] MRN: [id number] Date of Birth: 04-Apr-1976 Today's Date: 03/10/2013  3:00 - 3:23 23 minutes Individual session Patient denies pain.  Patient sitting in wheelchair and concerned about peri hygiene and requesting help to "get clean." Patient transferred wheelchair to bed with set up and supervision. Patient sit to supine with supervision using bed rail. Patient worked on bed mobility - rolling side to side, bridging and long sitting to doff pants, clean peri area, and don mesh underwear and pants. Patient left resting in bed with items in reach.    [name] 03/10/2013, 3:58 PM

## 2013-03-10 NOTE — Progress Notes (Signed)
Physical Therapy Session Note  Patient Details  Name: [name] MRN: [id number] Date of Birth: Apr 17, 1976  Today's Date: 03/10/2013 Time: 1100-1200 Time Calculation (min): 60 min  Short Term Goals: Week 2:  PT Short Term Goal 1 (Week 2): Patient will perform bed mobility with min A,including rolling R and L and supine to sit with min A PT Short Term Goal 1 - Progress (Week 2): Met PT Short Term Goal 2 (Week 2): Patient will perform transfers with mod A from bed to w/c . PT Short Term Goal 2 - Progress (Week 2): Met PT Short Term Goal 3 (Week 2): Patient will propell wheelchair 200 feet with supervision and occasional cueing. PT Short Term Goal 3 - Progress (Week 2): Met PT Short Term Goal 4 (Week 2): patient will sit edge of bed or mat with supervision for 3 minutes PT Short Term Goal 4 - Progress (Week 2): Met Week 3:  PT Short Term Goal 1 (Week 3): Patient will perform bed mobility with supervision including rolling to right and left, and supine to and from sit PT Short Term Goal 2 (Week 3): Patient will perform wheelchair to bed/mat transfers with min assist PT Short Term Goal 3 (Week 3): Patient will ambulate 50 feet with hemi walker and min assist. PT Short Term Goal 4 (Week 3): Patient will ambulate up and down 5 steps with min assist.  Skilled Therapeutic Interventions/Progress Updates:  Treatment session focused on neuromuscular reeducation to left LE, increasing weight bearing on left LE, transfers and ambulation. Patient propelled wheelchair 200 feet to gym using right LE. Patient required min cueing for wheelchair set up. Patient transferred wheelchair to mat scooting with min steady assist. Patient tends to stand and pivot using right LE only. Attempted several transfers with and without hemi walker trying to increase weight bearing and use of left LE during transfers. Patient worked in standing stepping up and down onto 2 inch step with right LE to increase stance on left.  Patient side stepped with hemi walker working on weight shifting and weight bearing on left. Patient ambulated 80 feet with hemi walker and mod assist for balance. Patient progressed left LE 100% of the time. Patient required facilitation/cueing to increase weight bearing and hip and knee extension on left. Patient ambulated up and down 5 steps with 1 rail and mod assist. Patient exercised on Nustep x 5 minutes to facilitate left hip and knee extension. Patient wheeled self back to room and was assisted with tray set up for lunch.  Therapy Documentation Precautions:  Precautions Precautions: Fall Precaution Comments: lethargic, leans to L; delayed processing; impulsive Restrictions Weight Bearing Restrictions: No  Pain: Pain Assessment Pain Assessment: No/denies pain  Locomotion : Ambulation Ambulation/Gait Assistance: 3: Mod assist Wheelchair Mobility Distance: 200   See FIM for current functional status  Therapy/Group: Individual Therapy  Alma Friendly 03/10/2013, 12:17 PM

## 2013-03-10 NOTE — Progress Notes (Signed)
Patient ID: [name], female   DOB: 06-28-1976, 37 y.o.   MRN: [id number] Subjective/Complaints: No New complaints 36y.o. 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 Admitted 02/16/2013 with complaints of headache and left-sided weakness with altered mental status. INR on admission of 1.14 with noted poor compliance on Coumadin. MRI of the brain showed acute infarct right MCA territory without hemorrhage as well as scattered small areas of acute infarct left frontal and parietal lobe. Patient did not receive TPA. Echocardiogram with ejection fraction of 60% without embolism. 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 There was a filling defect involving the innominate artery thought likely to represent thrombus. There was also occlusion of the right cavernous carotid and right M1 segment indicate of embolic phenomenon. Underwent ICA thrombectomy 02/16/2013 per interventional radiology. Patient had been placed on aspirin therapy per neurology services and Coumadin later resumed of which patient adamantly refused and patient was placed thus patient was placed on Xarelto for stroke prevention 02/20/2013  No pain c/o Moving LLE better  Review of Systems  Neurological: Positive for dizziness and focal weakness.  All other systems reviewed and are negative.    Objective: Vital Signs: Blood pressure 143/89, pulse 98, temperature 97.5 F (36.4 C), temperature source Oral, resp. rate 18, weight 65.5 kg (144 lb 6.4 oz), last menstrual period 02/15/2013, SpO2 99.00%. No results found. No results found for this or any previous visit (from the past 72 hour(s)).   HEENT: normal Gen:  NAD Heart regular rate and rhythm no rubs murmurs or exercise Abdomen positive bowel sounds soft  nontender palpation Extremities no clubbing cyanosis or edema Musculoskeletal no shoulder pain with range of motion left side.+ subluxation Motor strength 5/5 in the right deltoid, biceps, triceps, grip, hip flexor, knee extensors, ankle dorsiflexor plantar flexor 0 in the left deltoid, biceps, triceps, grip 0. 2- minus in the hip flexor, knee extensor, ankle dorsiflexor, and toe flexor extensors Sensation is able to identify light touch in the left upper and left lower extremity as well as on the right side. 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 Functional deficits secondary to R MCA infarct which require 3+ hours per day of interdisciplinary therapy in a comprehensive inpatient rehab setting. Physiatrist is providing close team supervision and 24 hour management of active medical problems listed below. Physiatrist and rehab team continue to assess barriers to discharge/monitor patient progress toward functional and medical goals. FIM: FIM - Bathing Bathing Steps Patient Completed: Chest;Right Arm;Left Arm;Abdomen;Left upper leg;Right upper leg;Buttocks;Front perineal area;Right lower leg (including foot);Left lower leg (including foot) Bathing: 4: Steadying assist  FIM - Upper Body Dressing/Undressing Upper body dressing/undressing steps patient completed: Thread/unthread right bra strap;Thread/unthread left bra strap;Thread/unthread right sleeve of pullover shirt/dresss;Put head through opening of pull over shirt/dress;Pull shirt over trunk Upper body dressing/undressing: 4: Min-Patient completed 75 plus % of tasks FIM - Lower Body Dressing/Undressing Lower body dressing/undressing steps patient completed: Thread/unthread right underwear  leg;Pull underwear up/down;Thread/unthread left underwear leg;Thread/unthread right pants leg;Thread/unthread left pants leg;Pull pants up/down Lower body dressing/undressing: 3: Mod-Patient completed 50-74% of tasks  FIM -  Toileting Toileting steps completed by patient: Performs perineal hygiene Toileting Assistive Devices: Grab bar or rail for support Toileting: 2: Max-Patient completed 1 of 3 steps  FIM - Diplomatic Services operational officer Devices: Grab bars Toilet Transfers: 3-To toilet/BSC: Mod A (lift or lower assist);3-From toilet/BSC: Mod A (lift or lower assist)  FIM - Bed/Chair Transfer Bed/Chair Transfer Assistive Devices: Bed rails Bed/Chair Transfer: 4: Bed > Chair or W/C: Min A (steadying Pt. > 75%)  FIM - Locomotion: Wheelchair Distance: 200 Locomotion: Wheelchair: 5: Travels 150 ft or more: maneuvers on rugs and over door sills with supervision, cueing or coaxing FIM - Locomotion: Ambulation Locomotion: Ambulation Assistive Devices: Chief Operating Officer Ambulation/Gait Assistance: 2: Max assist Locomotion: Ambulation: 1: Travels less than 50 ft with maximal assistance (Pt: 25 - 49%)  Comprehension Comprehension Mode: Auditory Comprehension: 4-Understands basic 75 - 89% of the time/requires cueing 10 - 24% of the time  Expression Expression Mode: Verbal Expression: 5-Expresses complex 90% of the time/cues < 10% of the time  Social Interaction Social Interaction Mode: Asleep Social Interaction: 4-Interacts appropriately 75 - 89% of the time - Needs redirection for appropriate language or to initiate interaction.  Problem Solving Problem Solving: 4-Solves basic 75 - 89% of the time/requires cueing 10 - 24% of the time  Memory Memory: 4-Recognizes or recalls 75 - 89% of the time/requires cueing 10 - 24% of the time  Medical Problem List and Plan:  1. Thromboembolic right MCA infarct laeft hemiparesis, combination of Takayasu's arteritis and hyper coagulable  state. If pt starts ambulating more may need sling to control LUE 2. DVT Prophylaxis/Anticoagulation: IVC filter/ Xarelto.Lupus anticoagulant antibody positive , non compliant with WarfarinMonitor for any signs of bleeding  3. Neuropsych: This patient is not capable of making decisions on his/her own behalf.  -will need constant positive reinforcement and education  -mother appears quite positive and supportive however  4. History of lupus anticoag Pos, no other clinical signs of SLE Continue prednisone for Takayasu's arteritis, CBGs look ok 5. MRSA positive nasal swab. Contact precautions and remains afebrile 6. History of medical noncompliance. Counseling  7. Microcytic anemia. Latest hemoglobin 9.4 on 02/28/2013. Followup CBC  8.  Chronic constipation add senna S- pt  inaccurate with bowel reports. Adjust bowel program as needed 9.  Leukocytosis on chronic steroid, afebrile LOS (Days) 17 A FACE TO FACE EVALUATION WAS PERFORMED  Zephyr Sausedo E 03/10/2013, 9:10 AM

## 2013-03-10 NOTE — Progress Notes (Signed)
Occupational Therapy Session Note  Patient Details  Name: [name] MRN: [id number] Date of Birth: 11/27/1975  Today's Date: 03/10/2013 Time: 1000-1100 Time Calculation (min): 60 min  Short Term Goals: Week 1:  OT Short Term Goal 1 (Week 1): Pt.  will maintain midline control for 5 minutes during grooming activity OT Short Term Goal 1 - Progress (Week 1): Met OT Short Term Goal 2 (Week 1): Pt. will perform grooming with mod assist OT Short Term Goal 2 - Progress (Week 1): Met OT Short Term Goal 3 (Week 1): Pt. will perform UB bathing with minimal assist and max with portural control OT Short Term Goal 3 - Progress (Week 1): Met OT Short Term Goal 4 (Week 1): Pt. will bathe LB with mod assist and max assist with sitting balance OT Short Term Goal 4 - Progress (Week 1): Met OT Short Term Goal 5 (Week 1): Pt. will dress UB with mod assist OT Short Term Goal 5 - Progress (Week 1): Met Week 2:  OT Short Term Goal 1 (Week 2): Pt will be able to transfer on and off a tub bench with mod assist. OT Short Term Goal 2 (Week 2): Pt will be able to maintain sitting balance on tub bench with min assist to demonstrate improved trunk control. OT Short Term Goal 3 (Week 2): Pt will demonstrate improved left side awareness by finding items on her left side with minimal cuing. OT Short Term Goal 4 (Week 2): Pt will transfer on and off toilet with min assist. OT Short Term Goal 5 (Week 2): Pt will be able to stand statically with min assist with RUE support to allow caregivers to assist her with clothing management.     Skilled Therapeutic Interventions/Progress Updates:    Pt seen for B/D at shower level in the ADL apartment with a focus on tub transfers, trunk control, and attention to task. Pt did very well attending to tasks today. Several times her attention would divert, but she would quickly become aware of it and refocus.  She had good initiation and motivation with all tasks.  She was able to  transfer on/off bench with minimal assist to lift left leg.  Pt weight shifted to left hip to wash bottom and maintained balance.  She was able to pull pants over hips with receiving min assist to steady her standing balance.  No change to LUE active movement, but patient is managing it and attending to it.  Pt's PT had arrived for her next session.    Therapy Documentation Precautions:  Precautions Precautions: Fall Precaution Comments: lethargic, leans to L; delayed processing; impulsive Restrictions Weight Bearing Restrictions: No  Pain: Pain Assessment Pain Assessment: No/denies pain ADL:   See FIM for current functional status  Therapy/Group: Individual Therapy  Ariell Gunnels 03/10/2013, 11:28 AM

## 2013-03-11 ENCOUNTER — Inpatient Hospital Stay (HOSPITAL_COMMUNITY): Payer: Medicare Other | Admitting: Physical Therapy

## 2013-03-11 ENCOUNTER — Inpatient Hospital Stay (HOSPITAL_COMMUNITY): Payer: Medicare Other | Admitting: *Deleted

## 2013-03-11 ENCOUNTER — Inpatient Hospital Stay (HOSPITAL_COMMUNITY): Payer: Medicare Other | Admitting: Speech Pathology

## 2013-03-11 ENCOUNTER — Inpatient Hospital Stay (HOSPITAL_COMMUNITY): Payer: TRICARE For Life (TFL)

## 2013-03-11 ENCOUNTER — Inpatient Hospital Stay (HOSPITAL_COMMUNITY): Payer: Medicare Other

## 2013-03-11 NOTE — Patient Care Conference (Signed)
Inpatient RehabilitationTeam Conference and Plan of Care Update Date: 03/11/2013   Time: 11:10 AM    Patient Name: [name]      Medical Record Number: [id number]  Date of Birth: 02/19/1976 Sex: Female         Room/Bed: 4039/4039-01 Payor Info: Payor: MEDICARE  Plan: MEDICARE PART A AND B  Product Type: *No Product type*     Admitting Diagnosis: rt cva  Admit Date/Time:  02/21/2013  8:03 PM Admission Comments: No comment available   Primary Diagnosis:  Embolic infarction Principal Problem: Embolic infarction  Patient Active Problem List   Diagnosis Date Noted  . 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 Postprocedural respiratory failure 02/16/2013  . Essential hypertension, malignant 02/16/2013  . Sinus tachycardia 02/16/2013  . CVA (cerebral infarction) 12/05/2012  . Subtherapeutic international normalized ratio (INR) 12/05/2012  . ANEMIA, IRON DEFICIENCY 09/04/2006  . DEPRESSION 09/04/2006  . 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 THROMBOSIS, VENOUS 09/04/2006  . CONSTIPATION 09/04/2006  . OVARIAN CYST 09/04/2006  . FOOT DROP, LEFT 09/04/2006    Expected Discharge Date: Expected Discharge Date: 03/20/13  Team Members Present: Physician leading conference: Dr. Claudette Laws Social Worker Present: Dossie Der, LCSW Nurse Present: Rosalio Macadamia, RN PT Present: [name], PT;Caroline Adriana Simas, PT;Other (comment) Clarisse Gouge Ripa-PT) OT Present: Bretta Bang, OT Other (Discipline and Name): Charolette Child & Elnora Morrison     Current Status/Progress Goal Weekly Team Focus  Medical   Poor awareness of deficits, return  left lower extremity  Improved awareness of deficits, consider AFO  Training mother   Bowel/Bladder   patient is continent of bowel and bladder  to remain continent  toilet patient q2-3 hours prn   Swallow/Nutrition/ Hydration   regular textures and thin liquids   least restrictive p.o. intake  goals met    ADL's   close supevision to steady assist with  bathing, min with UB, max with toileting and LB dressing, min with stand pivot transfers, min assist with static standing balance, improving attention and ability to refocus  min assist overall  ADL retraining, LUE neuro re-ed, functional mobility training, balance activities   Mobility   supervision to occasional min assist with bed mobility; min assist with transfers; mod assist ambulating 80 feet with hemiwalker progressing left LE independently working to increase stance time and control on left.  min assist transfers and short distance ambulation  NM reeducation, strengthening, bed mobility, transfers, standing, ambulation, and family education   Communication   min assist   min assist  increase intonation, self-monitoring and correcting   Safety/Cognition/ Behavioral Observations  mod assist  min assist  increase emergent awareness, self-monitoring and correcting    Pain   no complaints of pain  pain less than 2  pre medicate patient for therapy prn   Skin   no skin breakdown  free of skin breakdown  repostion patient q2-3 prn      *See Care Plan and progress notes for long and short-term goals.  Barriers to Discharge: 24 7 care post discharge,    Possible Resolutions to Barriers:  Caregiver training    Discharge Planning/Teaching Needs:  Have Mom come in for family education to prepare for dishcarge next week.  Has been here to observe      Team Discussion:  Progressing in therpaies-move movement in leg-initatation better.  Showing humor now. Family education with Mom on-going.  Going to Mom's home at discharge less steps  Revisions to Treatment Plan:  NOne   Continued  Need for Acute Rehabilitation Level of Care: The patient requires daily medical management by a physician with specialized training in physical medicine and rehabilitation for the following conditions: Daily direction of a multidisciplinary physical rehabilitation program to ensure safe treatment while  eliciting the highest outcome that is of practical value to the patient.: Yes Daily medical management of patient stability for increased activity during participation in an intensive rehabilitation regime.: Yes Daily analysis of laboratory values and/or radiology reports with any subsequent need for medication adjustment of medical intervention for : Neurological problems  [name], Lemar Livings 03/13/2013, 10:25 AM

## 2013-03-11 NOTE — Progress Notes (Addendum)
Physical Therapy Session Note  Patient Details  Name: [name] MRN: [id number] Date of Birth: 1975-12-24  Today's Date: 03/11/2013 Time: 0915-1005 and 1335-1440 Time Calculation (min): 50 min and 65 min  Short Term Goals:  Week 3:  PT Short Term Goal 1 (Week 3): Patient will perform bed mobility with supervision including rolling to right and left, and supine to and from sit PT Short Term Goal 2 (Week 3): Patient will perform wheelchair to bed/mat transfers with min assist PT Short Term Goal 3 (Week 3): Patient will ambulate 50 feet with hemi walker and min assist. PT Short Term Goal 4 (Week 3): Patient will ambulate up and down 5 steps with min assist.  Skilled Therapeutic Interventions/Progress Updates:   1st tx- w/c mobility, neuro re-ed neuromuscular re-education via manual cues, VCs for L LE stance control including eccentric control, and pelvic dissociation to encourage incorporating L pelvic protraction/retraction to scoot hips forward/backward in w/c  POC addended to add functional e. stim for LLE; explained to pt.  2nd tx- neuromuscular re-education via VCs, visual feedback and e. stim to L ant tib and hamstring muscles for swing and stance phase components of gait.  Pt demonstrated good muscular responses, however she was unable to tolerate the hamstring stimulation sufficiently for working on eccentric control to decrease hyperextension; gait with HW x 15' with max assist.  Neuro re-ed during W/c propulsion using bil LEs, attempting to use L hamstrings to pull w/c.  Pt and boyfriend Karie Mainland instructed in L hamstring activity to be performed in sitting, working on L knee flexion/extension to prepare for sit> stand, and gait.    Safety belt applied and all items left within reach.    Therapy Documentation Precautions:  Precautions Precautions: Fall Precaution Comments: delayed processing; impulsive Restrictions Weight Bearing Restrictions: No   Pain: Pain  Assessment Pain Assessment: No/denies pain   Other Treatments: Treatments Neuromuscular Facilitation: Lower Extremity;Activity to increase motor control;Activity to increase timing and sequencing;Activity to increase anterior-posterior weight shifting Modalities Modalities: Electrical Stimulation (L anterior tib. and hamstrings) Electrical Stimulation Electrical Stimulation Action: gait and LLE backward stepping Electrical Stimulation Parameters: 30 pps, 6 sec on, 4 sec off Electrical Stimulation Goals: Neuromuscular facilitation  See FIM for current functional status  Therapy/Group: Individual Therapy  Elisheva Fallas 03/11/2013, 2:50 PM

## 2013-03-11 NOTE — Progress Notes (Signed)
NUTRITION FOLLOW UP  Intervention:   1. Discontinue Resource Breeze - RN reports pt is refusing and is eating much better now 2. RD to continue to follow nutrition care plan  Nutrition Dx:   Increased nutrient needs related to acute illness/injury as evidenced by estimated needs. Ongoing.  Goal:   Intake to meet >90% of estimated nutrition needs. Met.  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 Admitted 3/24 with complaints of headache and left-sided weakness with AMS. MRI of the brain showed acute infarct right MCA territory without hemorrhage as well as scattered small areas of acute infarct left frontal and parietal lobe. Underwent ICA thrombectomy 03/24.   Working towards d/c of 4/25. Upgraded to Regular diet on 4/10. Meal intake has overall improved, now more consistently consuming >50% of meals. Continues with order for Resource Breeze po TID - refusing per RN.  Height: Ht Readings from Last 1 Encounters:  02/16/13 5\' 8"  (1.727 m)    Weight Status:   Wt Readings from Last 1 Encounters:  02/27/13 144 lb 6.4 oz (65.5 kg)  Wt stable.  Re-estimated needs:  Kcal: 1650 - 1900 Protein: 90 - 100 Fluid: at least 1.5 liters daily  Skin: intact  Diet Order: General    Intake/Output Summary (Last 24 hours) at 03/11/13 1038 Last data filed at 03/11/13 0900  Gross per 24 hour  Intake    720 ml  Output      0 ml  Net    720 ml    Last BM: 4/15   Labs:  No results found for this basename: NA, K, CL, CO2, BUN, CREATININE, CALCIUM, MG, PHOS, GLUCOSE,  in the last 168 hours  CBG (last 3)  No results found for this basename: GLUCAP,  in the last 72 hours  Scheduled Meds: . feeding supplement  1 Container Oral TID BM  . methylphenidate  5 mg Oral BID WC  .  multivitamin with minerals  1 tablet Oral Daily  . predniSONE  20 mg Oral Daily  . rivaroxaban  20 mg Oral Q supper  . senna-docusate  2 tablet Oral QHS  . temazepam  15 mg Oral QHS    Continuous Infusions:  none  Jarold Motto MS, RD, LDN Pager: [phone] After-hours pager: [phone]

## 2013-03-11 NOTE — Progress Notes (Signed)
Occupational Therapy Session Note  Patient Details  Name: [name] MRN: [id number] Date of Birth: Oct 21, 1976  Today's Date: 03/11/2013 Time: 0800-0900 Time Calculation (min): 60 min  Short Term Goals: Week 3:     Skilled Therapeutic Interventions/Progress Updates:    Pt in bed upon arrival and agreeable to bathing at shower level and dressing with sit<>stand from w/c.  Pt stated she wanted to use shower in ADL apartment because the water was hotter.  Pt required min A for stand pivot transfers.  Pt spent unusual amount of time with bathing and stated that she never felt clean.  Pt requested that I remove soap because she would keep washing if there was soap remaining.  Pt required increased assistance for dressing secondary to time limitations.  Discussed with patient the importance of time management during ADLs and other tasks.  Focus on sequencing, task termination and transitions, transfers, and safety awareness.  Therapy Documentation Precautions:  Precautions Precautions: Fall Precaution Comments: lethargic, leans to L; delayed processing; impulsive Restrictions Weight Bearing Restrictions: No   Pain: Pain Assessment Pain Assessment: No/denies pain  See FIM for current functional status  Therapy/Group: Individual Therapy  Rich Brave 03/11/2013, 9:27 AM

## 2013-03-11 NOTE — Progress Notes (Signed)
Patient ID: [name], female   DOB: 29-Sep-1976, 37 y.o.   MRN: [id number] Subjective/Complaints: No New complaints 36y.o. 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 Admitted 02/16/2013 with complaints of headache and left-sided weakness with altered mental status. INR on admission of 1.14 with noted poor compliance on Coumadin. MRI of the brain showed acute infarct right MCA territory without hemorrhage as well as scattered small areas of acute infarct left frontal and parietal lobe. Patient did not receive TPA. Echocardiogram with ejection fraction of 60% without embolism. 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 There was a filling defect involving the innominate artery thought likely to represent thrombus. There was also occlusion of the right cavernous carotid and right M1 segment indicate of embolic phenomenon. Underwent ICA thrombectomy 02/16/2013 per interventional radiology. Patient had been placed on aspirin therapy per neurology services and Coumadin later resumed of which patient adamantly refused and patient was placed thus patient was placed on Xarelto for stroke prevention 02/20/2013  No shoulder pain, no hand pain Asking about stem cell treatment Review of Systems  Neurological: Positive for dizziness and focal weakness.  All other systems reviewed and are negative.    Objective: Vital Signs: Blood pressure 126/73, pulse 76, temperature 98.1 F (36.7 C), temperature source Oral, resp. rate 17, weight 65.5 kg (144 lb 6.4 oz), last menstrual period 02/15/2013, SpO2 100.00%. No results found. Results for orders placed during the hospital encounter of 02/21/13 (from the past 72 hour(s))  CBC     Status: Abnormal   Collection Time    03/10/13 11:21 AM   Result Value Range   WBC 18.3 (*) 4.0 - 10.5 K/uL   RBC 4.24  3.87 - 5.11 MIL/uL   Hemoglobin 10.7 (*) 12.0 - 15.0 g/dL   HCT 40.9 (*) 81.1 - 91.4 %   MCV 77.1 (*) 78.0 - 100.0 fL   MCH 25.2 (*) 26.0 - 34.0 pg   MCHC 32.7  30.0 - 36.0 g/dL   RDW 78.2 (*) 95.6 - 21.3 %   Platelets 523 (*) 150 - 400 K/uL     HEENT: normal Gen:  NAD Heart regular rate and rhythm no rubs murmurs or exercise Abdomen positive bowel sounds soft nontender palpation Extremities no clubbing cyanosis or edema Musculoskeletal no shoulder pain with range of motion left side.+ subluxation Motor strength 5/5 in the right deltoid, biceps, triceps, grip, hip flexor, knee extensors, ankle dorsiflexor plantar flexor 0 in the left deltoid, biceps, triceps, grip 0. 2- minus in the hip flexor, knee extensor, ankle dorsiflexor, and toe flexor extensors Sensation is able to identify light touch in the left upper and left lower extremity as well as on the right side. 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 Functional deficits secondary to R MCA infarct which require 3+ hours per day of interdisciplinary therapy in a comprehensive inpatient rehab setting. Physiatrist is providing close team supervision and 24 hour management of active medical problems listed below. Physiatrist and rehab team continue to assess barriers to discharge/monitor patient progress toward functional and medical goals. FIM: FIM - Bathing Bathing  Steps Patient Completed: Chest;Right Arm;Left Arm;Abdomen;Left upper leg;Right upper leg;Buttocks;Front perineal area;Right lower leg (including foot);Left lower leg (including foot) Bathing: 4: Steadying assist  FIM - Upper Body Dressing/Undressing Upper body  dressing/undressing steps patient completed: Thread/unthread right bra strap;Thread/unthread left bra strap;Thread/unthread right sleeve of pullover shirt/dresss;Put head through opening of pull over shirt/dress;Pull shirt over trunk Upper body dressing/undressing: 4: Min-Patient completed 75 plus % of tasks FIM - Lower Body Dressing/Undressing Lower body dressing/undressing steps patient completed: Thread/unthread right underwear leg;Pull underwear up/down;Thread/unthread right pants leg;Pull pants up/down Lower body dressing/undressing: 3: Mod-Patient completed 50-74% of tasks  FIM - Toileting Toileting steps completed by patient: Performs perineal hygiene Toileting Assistive Devices: Grab bar or rail for support Toileting: 2: Max-Patient completed 1 of 3 steps  FIM - Diplomatic Services operational officer Devices: Elevated toilet seat;Grab bars Toilet Transfers: 3-To toilet/BSC: Mod A (lift or lower assist);3-From toilet/BSC: Mod A (lift or lower assist)  FIM - Banker Devices: Bed rails Bed/Chair Transfer: 3: Bed > Chair or W/C: Mod A (lift or lower assist);3: Chair or W/C > Bed: Mod A (lift or lower assist)  FIM - Locomotion: Wheelchair Distance: 200 Locomotion: Wheelchair: 5: Travels 150 ft or more: maneuvers on rugs and over door sills with supervision, cueing or coaxing FIM - Locomotion: Ambulation Locomotion: Ambulation Assistive Devices: Chief Operating Officer Ambulation/Gait Assistance: 3: Mod assist Locomotion: Ambulation: 2: Travels 50 - 149 ft with moderate assistance (Pt: 50 - 74%)  Comprehension Comprehension Mode: Auditory Comprehension: 5-Follows basic conversation/direction: With extra time/assistive device  Expression Expression Mode: Verbal Expression: 5-Expresses complex 90% of the time/cues < 10% of the time  Social Interaction Social Interaction Mode: Asleep Social Interaction: 4-Interacts appropriately 75 - 89% of the  time - Needs redirection for appropriate language or to initiate interaction.  Problem Solving Problem Solving: 3-Solves basic 50 - 74% of the time/requires cueing 25 - 49% of the time  Memory Memory: 5-Recognizes or recalls 90% of the time/requires cueing < 10% of the time  Medical Problem List and Plan:  1. Thromboembolic right MCA infarct laeft hemiparesis, combination of Takayasu's arteritis and hyper coagulable state. If pt starts ambulating more may need sling to control LUE 2. DVT Prophylaxis/Anticoagulation: IVC filter/ Xarelto.Lupus anticoagulant antibody positive , non compliant with WarfarinMonitor for any signs of bleeding  3. Neuropsych: This patient is not capable of making decisions on his/her own behalf.  -will need constant positive reinforcement and education  -mother appears quite positive and supportive however  4. History of lupus anticoag Pos, no other clinical signs of SLE Continue prednisone for Takayasu's arteritis, CBGs look ok 5. MRSA positive nasal swab. Contact precautions and remains afebrile 6. History of medical noncompliance. Counseling  7. Microcytic anemia. Latest hemoglobin 9.4 on 02/28/2013. Followup CBC  8.  Chronic constipation add senna S- pt  inaccurate with bowel reports. Adjust bowel program as needed 9.  Leukocytosis on chronic steroid, afebrile LOS (Days) 18 A FACE TO FACE EVALUATION WAS PERFORMED  Khali Perella E 03/11/2013, 12:37 PM

## 2013-03-11 NOTE — Progress Notes (Signed)
Speech Language Pathology Daily Session Note  Patient Details  Name: [name] MRN: [id number] Date of Birth: [birth year]  Today's Date: 03/11/2013 Time: 0915-1005 Time Calculation (min): 50 min  Short Term Goals: Week 3: SLP Short Term Goal 1 (Week 3): Patient will attend to left visual field during a functional task or conversation with min clinician cues.  SLP Short Term Goal 2 (Week 3): Patient will sustain attention to basic functional ADL for 5 minutes with mod clinician cues.  SLP Short Term Goal 3 (Week 3): Patient will demonstrate emergent awareness of deficits by requesting help as needed with moderate questions cues.  SLP Short Term Goal 4 (Week 3): Patient will self-monitor and correct errors with functional tasks with moderate question cues.  SLP Short Term Goal 5 (Week 3): Patient will read passages with intiation with mod assist clinician cues.  Skilled Therapeutic Interventions: Skilled treatment session focused on addressing cognitive skills during various tasks.  SLP facilitated session with Sudoku task that patient requested help with; patient required max assist faded to min-mod assist to complete procedures for task.  Patient's sustained attention and left attention were the biggest limiting factors and increased wait time with verbal cues for re-direction were effective at assisting patient.   SLP also facilitated session with min assist cues to utilize schedule to anticipate needs for next therapy session.    FIM:  Comprehension Comprehension Mode: Auditory Comprehension: 5-Follows basic conversation/direction: With extra time/assistive device Expression Expression Mode: Verbal Expression: 5-Expresses complex 90% of the time/cues < 10% of the time Social Interaction Social Interaction: 4-Interacts appropriately 75 - 89% of the time - Needs redirection for appropriate language or to initiate interaction. Problem Solving Problem Solving: 3-Solves basic 50 - 74% of  the time/requires cueing 25 - 49% of the time Memory Memory: 5-Recognizes or recalls 90% of the time/requires cueing < 10% of the time  Pain Pain Assessment Pain Assessment: No/denies pain  Therapy/Group: Individual Therapy  Charlane Ferretti., CCC-SLP [phone]  Amaar Oshita 03/11/2013, 12:16 PM

## 2013-03-12 ENCOUNTER — Inpatient Hospital Stay (HOSPITAL_COMMUNITY): Payer: Medicare Other | Admitting: Occupational Therapy

## 2013-03-12 ENCOUNTER — Inpatient Hospital Stay (HOSPITAL_COMMUNITY): Payer: TRICARE For Life (TFL)

## 2013-03-12 ENCOUNTER — Inpatient Hospital Stay (HOSPITAL_COMMUNITY): Payer: Medicare Other | Admitting: Speech Pathology

## 2013-03-12 DIAGNOSIS — I69998 Other sequelae following unspecified cerebrovascular disease: Secondary | ICD-10-CM

## 2013-03-12 DIAGNOSIS — G8110 Spastic hemiplegia affecting unspecified side: Secondary | ICD-10-CM

## 2013-03-12 DIAGNOSIS — I69993 Ataxia following unspecified cerebrovascular disease: Secondary | ICD-10-CM

## 2013-03-12 DIAGNOSIS — I6340 Cerebral infarction due to embolism of unspecified cerebral artery: Secondary | ICD-10-CM

## 2013-03-12 DIAGNOSIS — R209 Unspecified disturbances of skin sensation: Secondary | ICD-10-CM

## 2013-03-12 MED ORDER — TEMAZEPAM 15 MG PO CAPS
15.0000 mg | ORAL_CAPSULE | Freq: Every day | ORAL | Status: DC
  Administered 2013-03-12 – 2013-03-19 (×8): 15 mg via ORAL
  Filled 2013-03-12: qty 2
  Filled 2013-03-12: qty 1
  Filled 2013-03-12 (×2): qty 2
  Filled 2013-03-12 (×2): qty 1
  Filled 2013-03-12: qty 2
  Filled 2013-03-12: qty 1

## 2013-03-12 NOTE — Progress Notes (Signed)
Physical Therapy Session Note  Patient Details  Name: [name] MRN: [id number] Date of Birth: 1975-12-20  Today's Date: 03/12/2013 Time: 1100-1205 Time Calculation (min): 65 min  Short Term Goals: Week 3:  PT Short Term Goal 1 (Week 3): Patient will perform bed mobility with supervision including rolling to right and left, and supine to and from sit PT Short Term Goal 2 (Week 3): Patient will perform wheelchair to bed/mat transfers with min assist PT Short Term Goal 3 (Week 3): Patient will ambulate 50 feet with hemi walker and min assist. PT Short Term Goal 4 (Week 3): Patient will ambulate up and down 5 steps with min assist.  Skilled Therapeutic Interventions/Progress Updates:   Tx focused on basic transfers, w/c mobility, neuromuscular re-education LLE  W/c> mat stand pivot with supervision/min assist due to impulsivity and inattention to L foot placement.  neuromuscular re-education via demo, visual feedback, VCs, for pelvic dissociation/L pelvic protraction/retraction needed for transfer prep and gait, sit><stand working on = wt bearing and eccentric control, gait with HW x 35'.  Pt demonstrated improved LLE stance stability, less impulsivity and less distractibility today during gait.  W/c mobility using LLE primarily, x 200' with distant supervision on straight path and turns.  Due to L inattention when turning into her doorway, pt bumped footrest on door frame.  She propelled through an obstacle course of armchairs, encountering chairs when on L, 1 out of 3 turns for 1st 2 trials, 0 out of 3 turns on 3rd trial.     Therapy Documentation Precautions:  Precautions Precautions: Fall Precaution Comments: delayed processing; impulsive, left inattention Restrictions Weight Bearing Restrictions: No Pain: Pain Assessment Pain Assessment: No/denies pain   Locomotion : Ambulation Ambulation/Gait Assistance: 3: Mod assist Wheelchair Mobility Distance: 200    Other  Treatments: Treatments Neuromuscular Facilitation: Lower Extremity;Activity to increase motor control;Activity to increase timing and sequencing;Activity to increase anterior-posterior weight shifting;Activity to increase lateral weight shifting;Left;Forced use  See FIM for current functional status  Therapy/Group: Individual Therapy  Rania Prothero 03/12/2013, 12:18 PM

## 2013-03-12 NOTE — Progress Notes (Signed)
Occupational Therapy Session Note  Patient Details  Name: [name] MRN: [id number] Date of Birth: Apr 08, 1976  Today's Date: 03/12/2013 Time: 4098-1191 Time Calculation (min): 51 min  Skilled Therapeutic Interventions/Progress Updates:    Pt fitted for Bioness H200 NMES during session.  Pt able to tolerate 5 mins in Exercise Mode with emphasis on digit flexion and extension.  Pt able to perform transfer from wheelchair to mat and back to wheelchair with min assist.  Still needing mod instructional cueing to slow down and position the LLE under her prior to transfer.  Therapy Documentation Precautions:  Precautions Precautions: Fall Precaution Comments: delayed processing; impulsive, left inattention Restrictions Weight Bearing Restrictions: No  Pain: Pain Assessment Pain Assessment: No/denies pain Pain Score: 0-No pain  See FIM for current functional status  Therapy/Group: Individual Therapy  Warwick Nick OTR/L 03/12/2013, 4:10 PM

## 2013-03-12 NOTE — Progress Notes (Signed)
Occupational Therapy Session Note  Patient Details  Name: [name] MRN: [id number] Date of Birth: 05/14/1976  Today's Date: 03/12/2013 Time: 0800-0900 Time Calculation (min): 60 min  Short Term Goals: Week 2:  OT Short Term Goal 1 (Week 2): Pt will be able to transfer on and off a tub bench with mod assist. OT Short Term Goal 2 (Week 2): Pt will be able to maintain sitting balance on tub bench with min assist to demonstrate improved trunk control. OT Short Term Goal 3 (Week 2): Pt will demonstrate improved left side awareness by finding items on her left side with minimal cuing. OT Short Term Goal 4 (Week 2): Pt will transfer on and off toilet with min assist. OT Short Term Goal 5 (Week 2): Pt will be able to stand statically with min assist with RUE support to allow caregivers to assist her with clothing management.  Skilled Therapeutic Interventions/Progress Updates:    Pt worked on shower this am and dressing.  Needed mod questioning cues to sequence bathing secondary to perseverating on washing the right side.  She needs total assist for utilizing the LUE to wash the right side.  Min assist needed for sit to stand with mod instructional cueing for positioning the left foot and hand.  Pt demonstrating difficulty maintaining divided attention on hemidressing techniques while engaged in conversation.  Required mod instructional cues for donning shirt and starting bra over the LUE first.      Therapy Documentation Precautions:  Precautions Precautions: Fall Precaution Comments: delayed processing; impulsive, left inattention Restrictions Weight Bearing Restrictions: No  Pain: Pain Assessment Pain Assessment: No/denies pain ADL: See FIM for current functional status  Therapy/Group: Individual Therapy  Jordanny Waddington OTR/L 03/12/2013, 10:11 AM

## 2013-03-12 NOTE — Progress Notes (Signed)
Social Work Patient ID: [name], female   DOB: 12-17-1975, 37 y.o.   MRN: [id number] Met with pt and spoke with Mom via telephone to discuss team conference progression toward goals and discharge.  Still deciding Which home to go too.  Pt's landlord looking to see if any one level available and will get back with pt.  Mom is concerned pt can not fit Into bathroom door at home.  Mom also discussed her heart issue, asked whether Mom felt she was not able to provide the care of daughter. She is coming in will discuss when here, mom cell phone was not working at this time either.

## 2013-03-12 NOTE — Progress Notes (Signed)
Patient ID: [name], female   DOB: 1976/04/24, 37 y.o.   MRN: [id number] Subjective/Complaints: No New complaints 36y.o. 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 Admitted 02/16/2013 with complaints of headache and left-sided weakness with altered mental status. INR on admission of 1.14 with noted poor compliance on Coumadin. MRI of the brain showed acute infarct right MCA territory without hemorrhage as well as scattered small areas of acute infarct left frontal and parietal lobe. Patient did not receive TPA. Echocardiogram with ejection fraction of 60% without embolism. 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 There was a filling defect involving the innominate artery thought likely to represent thrombus. There was also occlusion of the right cavernous carotid and right M1 segment indicate of embolic phenomenon. Underwent ICA thrombectomy 02/16/2013 per interventional radiology. Patient had been placed on aspirin therapy per neurology services and Coumadin later resumed of which patient adamantly refused and patient was placed thus patient was placed on Xarelto for stroke prevention 02/20/2013  No shoulder pain, no hand pain Patient's mother are visiting patient. Asking about prognosis for left upper extremity recovery. At this point she has flaccid hemiplegia no active movement in shoulder subluxation without pain. We discussed the prognosis for meaningful recovery of this arm is poor. I do expect she should get some proximal tone and this may improve  subluxation but not really improved function  Review of Systems  Neurological: Positive for dizziness and focal weakness.  All other systems reviewed and are negative.    Objective: Vital Signs: Blood pressure 107/57,  pulse 76, temperature 98.4 F (36.9 C), temperature source Oral, resp. rate 16, weight 65.5 kg (144 lb 6.4 oz), last menstrual period 02/15/2013, SpO2 98.00%. No results found. Results for orders placed during the hospital encounter of 02/21/13 (from the past 72 hour(s))  CBC     Status: Abnormal   Collection Time    03/10/13 11:21 AM      Result Value Range   WBC 18.3 (*) 4.0 - 10.5 K/uL   RBC 4.24  3.87 - 5.11 MIL/uL   Hemoglobin 10.7 (*) 12.0 - 15.0 g/dL   HCT 40.9 (*) 81.1 - 91.4 %   MCV 77.1 (*) 78.0 - 100.0 fL   MCH 25.2 (*) 26.0 - 34.0 pg   MCHC 32.7  30.0 - 36.0 g/dL   RDW 78.2 (*) 95.6 - 21.3 %   Platelets 523 (*) 150 - 400 K/uL     HEENT: normal Gen:  NAD Heart regular rate and rhythm no rubs murmurs or exercise Abdomen positive bowel sounds soft nontender palpation Extremities no clubbing cyanosis or edema Musculoskeletal no shoulder pain with range of motion left side.+ subluxation Motor strength 5/5 in the right deltoid, biceps, triceps, grip, hip flexor, knee extensors, ankle dorsiflexor plantar flexor 0 in the left deltoid, biceps, triceps, grip 0. 2- minus in the hip flexor, knee extensor, ankle dorsiflexor, and toe flexor extensors Sensation is able to identify light touch in the left upper and left lower extremity as well as on the right side. 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  Functional deficits secondary to R MCA infarct which require 3+ hours per day of interdisciplinary therapy in a comprehensive inpatient rehab setting. Physiatrist is providing close team supervision and 24 hour management of active medical problems listed below. Physiatrist and rehab team continue to assess barriers to discharge/monitor  patient progress toward functional and medical goals. FIM: FIM - Bathing Bathing Steps Patient Completed: Chest;Right Arm;Left Arm;Abdomen;Left upper leg;Right upper leg;Buttocks;Front perineal area;Right lower leg (including foot);Left lower leg (including foot) Bathing: 4: Steadying assist  FIM - Upper Body Dressing/Undressing Upper body dressing/undressing steps patient completed: Thread/unthread right bra strap;Thread/unthread left bra strap;Thread/unthread right sleeve of pullover shirt/dresss;Put head through opening of pull over shirt/dress;Pull shirt over trunk Upper body dressing/undressing: 4: Min-Patient completed 75 plus % of tasks FIM - Lower Body Dressing/Undressing Lower body dressing/undressing steps patient completed: Thread/unthread right underwear leg;Pull underwear up/down;Thread/unthread right pants leg;Pull pants up/down Lower body dressing/undressing: 3: Mod-Patient completed 50-74% of tasks  FIM - Toileting Toileting steps completed by patient: Performs perineal hygiene Toileting Assistive Devices: Grab bar or rail for support Toileting: 2: Max-Patient completed 1 of 3 steps  FIM - Diplomatic Services operational officer Devices: Elevated toilet seat;Grab bars Toilet Transfers: 3-To toilet/BSC: Mod A (lift or lower assist);3-From toilet/BSC: Mod A (lift or lower assist)  FIM - Banker Devices: Bed rails Bed/Chair Transfer: 3: Bed > Chair or W/C: Mod A (lift or lower assist);3: Chair or W/C > Bed: Mod A (lift or lower assist)  FIM - Locomotion: Wheelchair Distance: 200 Locomotion: Wheelchair: 5: Travels 150 ft or more: maneuvers on rugs and over door sills with supervision, cueing or coaxing FIM - Locomotion: Ambulation Locomotion: Ambulation Assistive Devices: Chief Operating Officer Ambulation/Gait Assistance: 2: Max assist Locomotion: Ambulation: 2: Travels 50 - 149 ft with moderate assistance (Pt: 50 -  74%)  Comprehension Comprehension Mode: Auditory Comprehension: 4-Understands basic 75 - 89% of the time/requires cueing 10 - 24% of the time  Expression Expression Mode: Verbal Expression: 5-Expresses basic needs/ideas: With extra time/assistive device  Social Interaction Social Interaction Mode: Asleep Social Interaction: 4-Interacts appropriately 75 - 89% of the time - Needs redirection for appropriate language or to initiate interaction.  Problem Solving Problem Solving: 3-Solves basic 50 - 74% of the time/requires cueing 25 - 49% of the time  Memory Memory: 4-Recognizes or recalls 75 - 89% of the time/requires cueing 10 - 24% of the time  Medical Problem List and Plan:  1. Thromboembolic right MCA infarct laeft hemiparesis, combination of Takayasu's arteritis and hyper coagulable state. If pt starts ambulating more may need sling to control LUE 2. DVT Prophylaxis/Anticoagulation: IVC filter/ Xarelto.Lupus anticoagulant antibody positive , non compliant with WarfarinMonitor for any signs of bleeding  3. Neuropsych: This patient is not capable of making decisions on his/her own behalf.  -will need constant positive reinforcement and education  -mother appears quite positive and supportive however  4. History of lupus anticoag Pos, no other clinical signs of SLE Continue prednisone for Takayasu's arteritis, CBGs look ok 5. MRSA positive nasal swab. Contact precautions and remains afebrile 6. History of medical noncompliance. Counseling  7. Microcytic anemia. Latest hemoglobin 9.4 on 02/28/2013. Followup CBC  8.  Chronic constipation add senna S- pt  inaccurate with bowel reports. Adjust bowel program as needed 9.  Leukocytosis on chronic steroid, afebrile LOS (Days) 19 A FACE TO FACE EVALUATION WAS PERFORMED  KIRSTEINS,ANDREW E 03/12/2013, 8:25 AM

## 2013-03-12 NOTE — Progress Notes (Signed)
Speech Language Pathology Daily Session Note  Patient Details  Name: [name] MRN: [id number] Date of Birth: 11/16/1976  Today's Date: 03/12/2013 Time: 2952-8413 Time Calculation (min): 25 min  Short Term Goals: Week 3: SLP Short Term Goal 1 (Week 3): Patient will attend to left visual field during a functional task or conversation with min clinician cues.  SLP Short Term Goal 2 (Week 3): Patient will sustain attention to basic functional ADL for 5 minutes with mod clinician cues.  SLP Short Term Goal 3 (Week 3): Patient will demonstrate emergent awareness of deficits by requesting help as needed with moderate questions cues.  SLP Short Term Goal 4 (Week 3): Patient will self-monitor and correct errors with functional tasks with moderate question cues.  SLP Short Term Goal 5 (Week 3): Patient will read passages with intiation with mod assist clinician cues.  Skilled Therapeutic Interventions: Skilled treatment session focused on addressing cognitive goals.  SLP facilitated session with categorical naming task that required patient list items in a given category starting with letters A-Z.  Patient required mod assist cues to attend to tasks for 5 minutes and mod assist cues to problem solve completion of task.  Continue with current plan of care.   FIM:  Comprehension Comprehension Mode: Auditory Comprehension: 5-Follows basic conversation/direction: With extra time/assistive device Expression Expression Mode: Verbal Expression: 5-Expresses complex 90% of the time/cues < 10% of the time Social Interaction Social Interaction: 4-Interacts appropriately 75 - 89% of the time - Needs redirection for appropriate language or to initiate interaction. Problem Solving Problem Solving: 3-Solves basic 50 - 74% of the time/requires cueing 25 - 49% of the time Memory Memory: 5-Recognizes or recalls 90% of the time/requires cueing < 10% of the time  Pain Pain Assessment Pain Assessment:  No/denies pain  Therapy/Group: Individual Therapy  Charlane Ferretti., CCC-SLP [phone]  Tykeem Lanzer 03/12/2013, 10:08 AM

## 2013-03-12 NOTE — Progress Notes (Signed)
Social Work Patient ID: [name], female   DOB: 1976-06-16, 37 y.o.   MRN: [id number] Met with pt and mom when here to discuss best discharge place either pt's home or mom's home.  It was decided to go to Mom's home due to Less steps.  Pt and mom are aware pt will require 24 hour care and mom will have someone stay with her while she works.  Mom to come in for more Education on Wed.  Continue to work on discharge plans.

## 2013-03-13 ENCOUNTER — Inpatient Hospital Stay (HOSPITAL_COMMUNITY): Payer: Medicare Other | Admitting: Speech Pathology

## 2013-03-13 ENCOUNTER — Inpatient Hospital Stay (HOSPITAL_COMMUNITY): Payer: Medicare Other | Admitting: *Deleted

## 2013-03-13 ENCOUNTER — Inpatient Hospital Stay (HOSPITAL_COMMUNITY): Payer: Medicare Other

## 2013-03-13 NOTE — Progress Notes (Signed)
Patient ID: [name], female   DOB: 19-Feb-1976, 37 y.o.   MRN: [id number] Subjective/Complaints: No New complaints 36y.o. 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 Admitted 02/16/2013 with complaints of headache and left-sided weakness with altered mental status. INR on admission of 1.14 with noted poor compliance on Coumadin. MRI of the brain showed acute infarct right MCA territory without hemorrhage as well as scattered small areas of acute infarct left frontal and parietal lobe. Patient did not receive TPA. Echocardiogram with ejection fraction of 60% without embolism. 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 There was a filling defect involving the innominate artery thought likely to represent thrombus. There was also occlusion of the right cavernous carotid and right M1 segment indicate of embolic phenomenon. Underwent ICA thrombectomy 02/16/2013 per interventional radiology. Patient had been placed on aspirin therapy per neurology services and Coumadin later resumed of which patient adamantly refused and patient was placed thus patient was placed on Xarelto for stroke prevention 02/20/2013  PT in room, toe off brace used, improving ankle DF noted Review of Systems  Neurological: Positive for dizziness and focal weakness.  All other systems reviewed and are negative.    Objective: Vital Signs: Blood pressure 106/66, pulse 71, temperature 98.3 F (36.8 C), temperature source Oral, resp. rate 16, weight 65.5 kg (144 lb 6.4 oz), last menstrual period 02/15/2013, SpO2 97.00%. No results found. Results for orders placed during the hospital encounter of 02/21/13 (from the past 72 hour(s))  CBC     Status: Abnormal   Collection Time    03/10/13 11:21 AM      Result  Value Range   WBC 18.3 (*) 4.0 - 10.5 K/uL   RBC 4.24  3.87 - 5.11 MIL/uL   Hemoglobin 10.7 (*) 12.0 - 15.0 g/dL   HCT 40.9 (*) 81.1 - 91.4 %   MCV 77.1 (*) 78.0 - 100.0 fL   MCH 25.2 (*) 26.0 - 34.0 pg   MCHC 32.7  30.0 - 36.0 g/dL   RDW 78.2 (*) 95.6 - 21.3 %   Platelets 523 (*) 150 - 400 K/uL     HEENT: normal Gen:  NAD Heart regular rate and rhythm no rubs murmurs or exercise Abdomen positive bowel sounds soft nontender palpation Extremities no clubbing cyanosis or edema Musculoskeletal no shoulder pain with range of motion left side.+ subluxation Motor strength 5/5 in the right deltoid, biceps, triceps, grip, hip flexor, knee extensors, ankle dorsiflexor plantar flexor 0 in the left deltoid, biceps, triceps, grip 0. 2- minus in the hip flexor, knee extensor, ankle dorsiflexor, and toe flexor extensors Sensation is able to identify light touch in the left upper and left lower extremity as well as on the right side. 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 Functional deficits secondary to R MCA infarct which require 3+ hours per day of interdisciplinary therapy in a comprehensive inpatient rehab setting. Physiatrist is providing close team supervision and 24 hour management of active medical problems listed below. Physiatrist and rehab team continue to assess barriers to discharge/monitor patient progress toward functional and medical goals. FIM:  FIM - Bathing Bathing Steps Patient Completed: Chest;Left Arm;Abdomen;Front perineal area;Right upper leg;Left upper leg;Right lower leg (including foot);Buttocks;Left lower leg (including foot) Bathing: 4: Min-Patient completes 8-9 1f 10 parts or 75+ percent  FIM - Upper Body  Dressing/Undressing Upper body dressing/undressing steps patient completed: Thread/unthread right bra strap;Thread/unthread left bra strap;Thread/unthread right sleeve of pullover shirt/dresss;Put head through opening of pull over shirt/dress;Pull shirt over trunk Upper body dressing/undressing: 4: Min-Patient completed 75 plus % of tasks FIM - Lower Body Dressing/Undressing Lower body dressing/undressing steps patient completed: Thread/unthread right underwear leg;Thread/unthread left underwear leg;Pull underwear up/down;Thread/unthread right pants leg;Thread/unthread left pants leg;Pull pants up/down;Don/Doff left shoe;Don/Doff right shoe Lower body dressing/undressing: 4: Min-Patient completed 75 plus % of tasks  FIM - Toileting Toileting steps completed by patient: Performs perineal hygiene Toileting Assistive Devices: Grab bar or rail for support Toileting: 2: Max-Patient completed 1 of 3 steps  FIM - Diplomatic Services operational officer Devices: Elevated toilet seat;Grab bars Toilet Transfers: 3-To toilet/BSC: Mod A (lift or lower assist);3-From toilet/BSC: Mod A (lift or lower assist)  FIM - Bed/Chair Transfer Bed/Chair Transfer Assistive Devices: Arm rests Bed/Chair Transfer: 4: Bed > Chair or W/C: Min A (steadying Pt. > 75%);4: Sit > Supine: Min A (steadying pt. > 75%/lift 1 leg)  FIM - Locomotion: Wheelchair Distance: 200 Locomotion: Wheelchair: 5: Travels 150 ft or more: maneuvers on rugs and over door sills with supervision, cueing or coaxing FIM - Locomotion: Ambulation Locomotion: Ambulation Assistive Devices: Environmental consultant - Hemi;Orthosis Ambulation/Gait Assistance: 3: Mod assist Locomotion: Ambulation: 1: Travels less than 50 ft with moderate assistance (Pt: 50 - 74%)  Comprehension Comprehension Mode: Auditory Comprehension: 4-Understands basic 75 - 89% of the time/requires cueing 10 - 24% of the time  Expression Expression Mode: Verbal Expression: 4-Expresses basic  75 - 89% of the time/requires cueing 10 - 24% of the time. Needs helper to occlude trach/needs to repeat words.  Social Interaction Social Interaction Mode: Asleep Social Interaction: 4-Interacts appropriately 75 - 89% of the time - Needs redirection for appropriate language or to initiate interaction.  Problem Solving Problem Solving: 3-Solves basic 50 - 74% of the time/requires cueing 25 - 49% of the time  Memory Memory: 4-Recognizes or recalls 75 - 89% of the time/requires cueing 10 - 24% of the time  Medical Problem List and Plan:  1. Thromboembolic right MCA infarct laeft hemiparesis, combination of Takayasu's arteritis and hyper coagulable state. If pt starts ambulating more may need sling to control LUE Hold off on AFO decisions until next week given good LE recovery 2. DVT Prophylaxis/Anticoagulation: IVC filter/ Xarelto.Lupus anticoagulant antibody positive , non compliant with WarfarinMonitor for any signs of bleeding  3. Neuropsych: This patient is not capable of making decisions on his/her own behalf.  -will need constant positive reinforcement and education  -mother appears quite positive and supportive however  4. History of lupus anticoag Pos, no other clinical signs of SLE Continue prednisone for Takayasu's arteritis, CBGs look ok 5. MRSA positive nasal swab. Contact precautions and remains afebrile 6. History of medical noncompliance. Counseling  7. Microcytic anemia. Latest hemoglobin 9.4 on 02/28/2013. Followup CBC  8.  Chronic constipation add senna S- pt  inaccurate with bowel reports. Adjust bowel program as needed 9.  Leukocytosis on chronic steroid, afebrile LOS (Days) 20 A FACE TO FACE EVALUATION WAS PERFORMED  KIRSTEINS,ANDREW E 03/13/2013, 9:51 AM

## 2013-03-13 NOTE — Progress Notes (Signed)
Speech Language Pathology Daily Session Note  Patient Details  Name: [name] MRN: [id number] Date of Birth: May 28, 1976  Today's Date: 03/13/2013 Time: 1450-1530 Time Calculation (min): 40 min  Short Term Goals: Week 3: SLP Short Term Goal 1 (Week 3): Patient will attend to left visual field during a functional task or conversation with min clinician cues.  SLP Short Term Goal 2 (Week 3): Patient will sustain attention to basic functional ADL for 5 minutes with mod clinician cues.  SLP Short Term Goal 3 (Week 3): Patient will demonstrate emergent awareness of deficits by requesting help as needed with moderate questions cues.  SLP Short Term Goal 4 (Week 3): Patient will self-monitor and correct errors with functional tasks with moderate question cues.  SLP Short Term Goal 5 (Week 3): Patient will read passages with intiation with mod assist clinician cues.  Skilled Therapeutic Interventions: Skilled treatment session focused on addressing cognitive goals.  SLP facilitated session with categorical naming task that required patient to recall procedures and items listed.  Patient required mod assist cues to select attention to task for 30 minutes and mod assist cues to initiate turns and problem solve completion of task.  Continue with current plan of care.   FIM:  Comprehension Comprehension Mode: Auditory Comprehension: 5-Understands basic 90% of the time/requires cueing < 10% of the time Expression Expression Mode: Verbal Expression: 5-Expresses basic 90% of the time/requires cueing < 10% of the time. Social Interaction Social Interaction: 4-Interacts appropriately 75 - 89% of the time - Needs redirection for appropriate language or to initiate interaction. Problem Solving Problem Solving: 3-Solves basic 50 - 74% of the time/requires cueing 25 - 49% of the time Memory Memory: 4-Recognizes or recalls 75 - 89% of the time/requires cueing 10 - 24% of the time  Pain Pain  Assessment Pain Assessment: No/denies pain  Therapy/Group: Individual Therapy  Charlane Ferretti., CCC-SLP [phone]  [name] 03/13/2013, 3:43 PM

## 2013-03-13 NOTE — Progress Notes (Signed)
Occupationall Therapy Note  Patient Details  Name: [name] MRN: [id number] Date of Birth: 06-19-1976 Today's Date: 03/13/2013 0800-0900  (60 min)  (1st session) Pain:  None Individual session  1st session:  Engaged  In tub shower in ADL apartment.  Pt. Transferred with mod assist to shower bench with physical assist to stabilized the left knee.  Pt. perseverated on washing same area 6-7 times.  Needed minimal cues to wash left side and physical assist with holding left leg.  Did lateral leans to wash bottom.  Pt talkative and took increased time for tasks.  Pt. Needed mod assist with hemi dressing techniques and verbal cues for organizing, sequencing.     Time:  1100-1200 ( )  (2nd session) Pain:  None Individual session 2nd session:  Treatment session focused on LUE neuromuscular re education, transfers, standing balance, rotational movements, wc mobility, functional ambulation, attending to the left.  Pt stood for 5 minutes for weight bearing through LUE while turning and squatting to place rings on mat.  Provided max verbal cues for sequencing and facilitation for weight bearing and postural control.  Ambulated with hemi walker for 30 feet with mod assist.  Performed PROM to LUE with increased tone noted in elbow.  Asked pt to do elbow stretches when she is not in therapy.    Humberto Seals 03/13/2013, 8:58 AM

## 2013-03-13 NOTE — Progress Notes (Signed)
Physical Therapy Note  Patient Details  Name: [name] MRN: [id number] Date of Birth: Dec 28, 1975 Today's Date: 03/13/2013  9:15 - 10:00 45 minutes Individual session. Patient denies pain.  Patient sitting in wheelchair at sink completing grooming tasks upon entering room. Patient completed brushing her teeth and washing her face with occasional assist for set up. Patient propelled wheelchair 200 feet to gym with supervision. Patient sit to stand and ambulated with hemi walker and mod assist  100 feet with toe off brace on left. Patient required occasional cueing to attend to task and for sequencing of feet and walker. Patient much improved with weight shift and stance on left LE. Patient continues to have occasional knee recurvatum on left. Patient ambulated up and down 5 steps with 1 rail and mod assist. Patient tends to demonstrate left inattention and requires more cueing for safety on steps. Patient requested practicing transfers mat to and from the wheelchair. Patient performed stand pivot transfers wheelchair to and from mat going to right and left with minimal steady assist. Patient propelled the wheelchair back to the room and was left with the nursing student.   [name] 03/13/2013, 12:21 PM

## 2013-03-14 ENCOUNTER — Inpatient Hospital Stay (HOSPITAL_COMMUNITY): Payer: TRICARE For Life (TFL) | Admitting: Occupational Therapy

## 2013-03-14 DIAGNOSIS — R209 Unspecified disturbances of skin sensation: Secondary | ICD-10-CM

## 2013-03-14 DIAGNOSIS — I69998 Other sequelae following unspecified cerebrovascular disease: Secondary | ICD-10-CM

## 2013-03-14 DIAGNOSIS — I69993 Ataxia following unspecified cerebrovascular disease: Secondary | ICD-10-CM

## 2013-03-14 DIAGNOSIS — G8110 Spastic hemiplegia affecting unspecified side: Secondary | ICD-10-CM

## 2013-03-14 DIAGNOSIS — I6340 Cerebral infarction due to embolism of unspecified cerebral artery: Secondary | ICD-10-CM

## 2013-03-14 NOTE — Progress Notes (Signed)
Occupational Therapy Note  Patient Details  Name: [name] MRN: [id number] Date of Birth: 1976-03-07 Today's Date: 03/14/2013  Time In:  13:45  Time Out:  14:30. Individual session no c/o pain.  Treatment focused on bed to wheelchair transfers using hemi cane, bed mobility, neuro re-ed to RUE for shoulder flexion, extension, elbow flexion and extension, sit to stand, standing balance, weight shifting in standing, functional ambulation with hemi walker and toe up brace - pt ambulated 60' with minimal assistance and facilitation for hip abductors and hip extension with stance on LLE.     Norton Pastel 03/14/2013, 4:04 PM

## 2013-03-14 NOTE — Progress Notes (Signed)
Patient ID: [name], female   DOB: 1976/05/06, 37 y.o.   MRN: [id number] Subjective/Complaints: No New complaints 36y.o. 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 Admitted 02/16/2013 with complaints of headache and left-sided weakness with altered mental status. INR on admission of 1.14 with noted poor compliance on Coumadin. MRI of the brain showed acute infarct right MCA territory without hemorrhage as well as scattered small areas of acute infarct left frontal and parietal lobe. Patient did not receive TPA. Echocardiogram with ejection fraction of 60% without embolism. 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 There was a filling defect involving the innominate artery thought likely to represent thrombus. There was also occlusion of the right cavernous carotid and right M1 segment indicate of embolic phenomenon. Underwent ICA thrombectomy 02/16/2013 per interventional radiology. Patient had been placed on aspirin therapy per neurology services and Coumadin later resumed of which patient adamantly refused and patient was placed thus patient was placed on Xarelto for stroke prevention 02/20/2013  No complaints. So in arousing today.  Review of Systems  Neurological: Positive for dizziness and focal weakness.  All other systems reviewed and are negative.    Objective: Vital Signs: Blood pressure 116/70, pulse 73, temperature 97.8 F (36.6 C), temperature source Oral, resp. rate 18, weight 65.5 kg (144 lb 6.4 oz), last menstrual period 02/15/2013, SpO2 97.00%. No results found. No results found for this or any previous visit (from the past 72 hour(s)).   HEENT: normal Gen:  NAD Heart regular rate and rhythm no rubs murmurs or exercise Abdomen positive bowel sounds  soft nontender palpation Extremities no clubbing cyanosis or edema Musculoskeletal no shoulder pain with range of motion left side.+ subluxation Motor strength 5/5 in the right deltoid, biceps, triceps, grip, hip flexor, knee extensors, ankle dorsiflexor plantar flexor 0 in the left deltoid, biceps, triceps, grip 0. 2- minus in the hip flexor, knee extensor, ankle dorsiflexor, and toe flexor extensors Sensation is able to identify light touch in the left upper and left lower extremity as well as on the right side. 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 Functional deficits secondary to R MCA infarct which require 3+ hours per day of interdisciplinary therapy in a comprehensive inpatient rehab setting. Physiatrist is providing close team supervision and 24 hour management of active medical problems listed below. Physiatrist and rehab team continue to assess barriers to discharge/monitor patient progress toward functional and medical goals. FIM: FIM - Bathing Bathing Steps Patient Completed: Chest;Left Arm;Abdomen;Front perineal area;Right upper leg;Left upper leg;Right lower leg (including foot);Buttocks;Left lower leg (including foot) Bathing: 4: Min-Patient completes 8-9 15f 10 parts or 75+ percent  FIM - Upper Body Dressing/Undressing Upper body dressing/undressing steps patient completed: Thread/unthread right bra strap;Thread/unthread left bra strap;Thread/unthread right sleeve of pullover shirt/dresss;Put head through opening of pull over shirt/dress;Pull shirt over trunk Upper body dressing/undressing: 4: Min-Patient completed 75 plus % of tasks FIM - Lower Body Dressing/Undressing Lower body dressing/undressing steps patient completed:  Thread/unthread right underwear leg;Thread/unthread left underwear leg;Pull underwear up/down;Thread/unthread right pants leg;Thread/unthread left pants leg;Pull pants up/down;Don/Doff left shoe;Don/Doff right shoe Lower body  dressing/undressing: 4: Min-Patient completed 75 plus % of tasks  FIM - Toileting Toileting steps completed by patient: Performs perineal hygiene Toileting Assistive Devices: Grab bar or rail for support Toileting: 2: Max-Patient completed 1 of 3 steps  FIM - Diplomatic Services operational officer Devices: Elevated toilet seat;Grab bars Toilet Transfers: 3-To toilet/BSC: Mod A (lift or lower assist);3-From toilet/BSC: Mod A (lift or lower assist)  FIM - Bed/Chair Transfer Bed/Chair Transfer Assistive Devices: Arm rests Bed/Chair Transfer: 4: Bed > Chair or W/C: Min A (steadying Pt. > 75%);4: Chair or W/C > Bed: Min A (steadying Pt. > 75%)  FIM - Locomotion: Wheelchair Distance: 200 feet Locomotion: Wheelchair: 5: Travels 150 ft or more: maneuvers on rugs and over door sills with supervision, cueing or coaxing FIM - Locomotion: Ambulation Locomotion: Ambulation Assistive Devices: Chief Operating Officer Ambulation/Gait Assistance: 4: Min assist Locomotion: Ambulation: 2: Travels 50 - 149 ft with minimal assistance (Pt.>75%)  Comprehension Comprehension Mode: Auditory Comprehension: 5-Understands complex 90% of the time/Cues < 10% of the time  Expression Expression Mode: Verbal Expression: 5-Expresses complex 90% of the time/cues < 10% of the time  Social Interaction Social Interaction Mode: Asleep Social Interaction: 4-Interacts appropriately 75 - 89% of the time - Needs redirection for appropriate language or to initiate interaction.  Problem Solving Problem Solving: 3-Solves basic 50 - 74% of the time/requires cueing 25 - 49% of the time  Memory Memory: 4-Recognizes or recalls 75 - 89% of the time/requires cueing 10 - 24% of the time  Medical Problem List  and Plan:  1. Thromboembolic right MCA infarct laeft hemiparesis, combination of Takayasu's arteritis and hyper coagulable state. If pt starts ambulating more may need sling to control LUE Hold off on AFO decisions until next week given good LE recovery 2. DVT Prophylaxis/Anticoagulation: IVC filter/ Xarelto.Lupus anticoagulant antibody positive , non compliant with WarfarinMonitor for any signs of bleeding  3. Neuropsych: This patient is not capable of making decisions on his/her own behalf.  -family supportive 4. History of lupus anticoag Pos, no other clinical signs of SLE Continue prednisone for Takayasu's arteritis, CBGs look ok 5. MRSA positive nasal swab. Contact precautions and remains afebrile 6. History of medical noncompliance. Counseling  7. Microcytic anemia. Latest hemoglobin 9.4 on 02/28/2013. Followup CBC  8.  Chronic constipation add senna S- pt  inaccurate with bowel reports. Adjust bowel program as needed 9.  Leukocytosis on chronic steroid, afebrile LOS (Days) 21 A FACE TO FACE EVALUATION WAS PERFORMED  Timberlee Roblero T 03/14/2013, 9:09 AM

## 2013-03-15 ENCOUNTER — Inpatient Hospital Stay (HOSPITAL_COMMUNITY): Payer: Medicare Other | Admitting: *Deleted

## 2013-03-15 ENCOUNTER — Inpatient Hospital Stay (HOSPITAL_COMMUNITY): Payer: Medicare Other | Admitting: Physical Therapy

## 2013-03-15 NOTE — Progress Notes (Signed)
Occupational Therapy Note  Patient Details  Name: DESHONDA CRYDERMAN MRN: [id number] Date of Birth: 06/10/1976 Today's Date: 03/15/2013  Time:  1300-1345  (45 min) Pain:  3/10 left wrist with pressure from bioness Individual session  Engaged in NMRE of LUE, wc mobility.  Pt. Propelled self from room to therapy gym.  Utilized bioness to facilitate LUE grasp and release.  OT assisted with proximal arm control while pt concentrated on grasp and release.  Pt. complained of the e stim hurting her wrist during session.  Cut back the intensity.  Removed the brace to see that  it was applying pressure on lateral aspect of wrist.   Pt. Propelled self back to room.  She attended 1 hour of stroke support group later that afternoon.    Humberto Seals 03/15/2013, 5:23 PM

## 2013-03-15 NOTE — Progress Notes (Signed)
Patient ID: [name], female   DOB: 30-Apr-1976, 37 y.o.   MRN: [id number] Subjective/Complaints: No New complaints 36y.o. 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 Admitted 02/16/2013 with complaints of headache and left-sided weakness with altered mental status. INR on admission of 1.14 with noted poor compliance on Coumadin. MRI of the brain showed acute infarct right MCA territory without hemorrhage as well as scattered small areas of acute infarct left frontal and parietal lobe. Patient did not receive TPA. Echocardiogram with ejection fraction of 60% without embolism. 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 There was a filling defect involving the innominate artery thought likely to represent thrombus. There was also occlusion of the right cavernous carotid and right M1 segment indicate of embolic phenomenon. Underwent ICA thrombectomy 02/16/2013 per interventional radiology. Patient had been placed on aspirin therapy per neurology services and Coumadin later resumed of which patient adamantly refused and patient was placed thus patient was placed on Xarelto for stroke prevention 02/20/2013  Remains difficult to arouse in the am. Some of this appears behavioral.  Review of Systems    All other systems reviewed and are negative.    Objective: Vital Signs: Blood pressure 131/74, pulse 55, temperature 97.6 F (36.4 C), temperature source Oral, resp. rate 18, weight 65.5 kg (144 lb 6.4 oz), last menstrual period 02/15/2013, SpO2 100.00%. No results found. No results found for this or any previous visit (from the past 72 hour(s)).   HEENT: normal Gen:  NAD Heart regular rate and rhythm no rubs murmurs or exercise Abdomen positive bowel sounds soft nontender  palpation Extremities no clubbing cyanosis or edema Musculoskeletal no shoulder pain with range of motion left side.+ subluxation Motor strength 5/5 in the right deltoid, biceps, triceps, grip, hip flexor, knee extensors, ankle dorsiflexor plantar flexor 0 in the left deltoid, biceps, triceps, grip 0. 2- minus in the hip flexor, knee extensor, ankle dorsiflexor, and toe flexor extensors Sensation is able to identify light touch in the left upper and left lower extremity as well as on the right side. 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 Functional deficits secondary to R MCA infarct which require 3+ hours per day of interdisciplinary therapy in a comprehensive inpatient rehab setting. Physiatrist is providing close team supervision and 24 hour management of active medical problems listed below. Physiatrist and rehab team continue to assess barriers to discharge/monitor patient progress toward functional and medical goals. FIM: FIM - Bathing Bathing Steps Patient Completed: Chest;Left Arm;Abdomen;Front perineal area;Right upper leg;Left upper leg;Right lower leg (including foot);Buttocks;Left lower leg (including foot) Bathing: 4: Min-Patient completes 8-9 48f 10 parts or 75+ percent  FIM - Upper Body Dressing/Undressing Upper body dressing/undressing steps patient completed: Thread/unthread right bra strap;Thread/unthread left bra strap;Thread/unthread right sleeve of pullover shirt/dresss;Put head through opening of pull over shirt/dress;Pull shirt over trunk Upper body dressing/undressing: 4: Min-Patient completed 75 plus % of tasks FIM - Lower Body Dressing/Undressing Lower body dressing/undressing steps patient completed: Thread/unthread right  underwear leg;Thread/unthread left underwear leg;Pull underwear up/down;Thread/unthread right pants leg;Thread/unthread left pants leg;Pull pants up/down;Don/Doff left shoe;Don/Doff right shoe Lower body  dressing/undressing: 4: Min-Patient completed 75 plus % of tasks  FIM - Toileting Toileting steps completed by patient: Performs perineal hygiene Toileting Assistive Devices: Grab bar or rail for support Toileting: 2: Max-Patient completed 1 of 3 steps  FIM - Diplomatic Services operational officer Devices: Elevated toilet seat;Grab bars Toilet Transfers: 3-To toilet/BSC: Mod A (lift or lower assist);3-From toilet/BSC: Mod A (lift or lower assist)  FIM - Bed/Chair Transfer Bed/Chair Transfer Assistive Devices: Arm rests Bed/Chair Transfer: 4: Bed > Chair or W/C: Min A (steadying Pt. > 75%);4: Chair or W/C > Bed: Min A (steadying Pt. > 75%)  FIM - Locomotion: Wheelchair Distance: 200 feet Locomotion: Wheelchair: 5: Travels 150 ft or more: maneuvers on rugs and over door sills with supervision, cueing or coaxing FIM - Locomotion: Ambulation Locomotion: Ambulation Assistive Devices: Chief Operating Officer Ambulation/Gait Assistance: 4: Min assist Locomotion: Ambulation: 2: Travels 50 - 149 ft with minimal assistance (Pt.>75%)  Comprehension Comprehension Mode: Auditory Comprehension: 5-Understands complex 90% of the time/Cues < 10% of the time  Expression Expression Mode: Verbal Expression: 5-Expresses complex 90% of the time/cues < 10% of the time  Social Interaction Social Interaction Mode: Asleep Social Interaction: 4-Interacts appropriately 75 - 89% of the time - Needs redirection for appropriate language or to initiate interaction.  Problem Solving Problem Solving: 3-Solves basic 50 - 74% of the time/requires cueing 25 - 49% of the time  Memory Memory: 4-Recognizes or recalls 75 - 89% of the time/requires cueing 10 - 24% of the time  Medical Problem List and Plan:  1.  Thromboembolic right MCA infarct laeft hemiparesis, combination of Takayasu's arteritis and hyper coagulable state. If pt starts ambulating more may need sling to control LUE Hold off on AFO decisions until next week given good LE recovery 2. DVT Prophylaxis/Anticoagulation: IVC filter/ Xarelto.Lupus anticoagulant antibody positive , non compliant with WarfarinMonitor for any signs of bleeding  3. Neuropsych: This patient is not capable of making decisions on his/her own behalf.  -family supportive 4. History of lupus anticoag Pos, no other clinical signs of SLE Continue prednisone for Takayasu's arteritis, CBGs look ok 5. MRSA positive nasal swab. Contact precautions and remains afebrile 6. History of medical noncompliance. Counseling  7. Microcytic anemia. Latest hemoglobin 9.4 on 02/28/2013. Followup CBC  8.  Chronic constipation add senna S- pt  inaccurate with bowel reports. Adjust bowel program as needed 9.  Leukocytosis on chronic steroid, afebrile LOS (Days) 22 A FACE TO FACE EVALUATION WAS PERFORMED  Xzaviar Maloof T 03/15/2013, 8:29 AM

## 2013-03-16 ENCOUNTER — Encounter (HOSPITAL_COMMUNITY): Payer: Self-pay | Admitting: *Deleted

## 2013-03-16 ENCOUNTER — Inpatient Hospital Stay (HOSPITAL_COMMUNITY): Payer: Medicare Other

## 2013-03-16 ENCOUNTER — Inpatient Hospital Stay (HOSPITAL_COMMUNITY): Payer: TRICARE For Life (TFL)

## 2013-03-16 ENCOUNTER — Inpatient Hospital Stay (HOSPITAL_COMMUNITY): Payer: Medicare Other | Admitting: Occupational Therapy

## 2013-03-16 DIAGNOSIS — G8110 Spastic hemiplegia affecting unspecified side: Secondary | ICD-10-CM

## 2013-03-16 DIAGNOSIS — I6340 Cerebral infarction due to embolism of unspecified cerebral artery: Secondary | ICD-10-CM

## 2013-03-16 DIAGNOSIS — I69998 Other sequelae following unspecified cerebrovascular disease: Secondary | ICD-10-CM

## 2013-03-16 DIAGNOSIS — I69993 Ataxia following unspecified cerebrovascular disease: Secondary | ICD-10-CM

## 2013-03-16 DIAGNOSIS — R209 Unspecified disturbances of skin sensation: Secondary | ICD-10-CM

## 2013-03-16 NOTE — Progress Notes (Signed)
Speech Language Pathology Daily Session Note  Patient Details  Name: [name] MRN: [id number] Date of Birth: [birth year]  Today's Date: 03/16/2013 Time: 1315-1400 Time Calculation (min): 45 min  Short Term Goals: Week 3: SLP Short Term Goal 1 (Week 3): Patient will attend to left visual field during a functional task or conversation with min clinician cues.  SLP Short Term Goal 2 (Week 3): Patient will sustain attention to basic functional ADL for 5 minutes with mod clinician cues.  SLP Short Term Goal 3 (Week 3): Patient will demonstrate emergent awareness of deficits by requesting help as needed with moderate questions cues.  SLP Short Term Goal 4 (Week 3): Patient will self-monitor and correct errors with functional tasks with moderate question cues.  SLP Short Term Goal 5 (Week 3): Patient will read passages with intiation with mod assist clinician cues.  Skilled Therapeutic Interventions: Skilled treatment session focused on cognitive goals. SLP facilitated session with checkbook balancing task that involved organizing, sequencing, and problem solving skills. Pt required Min assist cues to sequence and to select attention to the task for 20 minutes. SLP provided Mod assist cues for problem solving. Continue plan of care.   FIM:  Comprehension Comprehension Mode: Auditory Comprehension: 5-Understands complex 90% of the time/Cues < 10% of the time Expression Expression Mode: Verbal Expression: 5-Expresses basic needs/ideas: With extra time/assistive device Social Interaction Social Interaction: 5-Interacts appropriately 90% of the time - Needs monitoring or encouragement for participation or interaction. Problem Solving Problem Solving: 3-Solves basic 50 - 74% of the time/requires cueing 25 - 49% of the time Memory Memory: 4-Recognizes or recalls 75 - 89% of the time/requires cueing 10 - 24% of the time  Pain Pain Assessment Pain Assessment: No/denies pain  Therapy/Group:  Individual Therapy   Maxcine Ham, M.A. CF-SLP  Maxcine Ham 03/16/2013, 2:41 PM

## 2013-03-16 NOTE — Progress Notes (Signed)
Patient ID: [name], female   DOB: 28-Aug-1976, 37 y.o.   MRN: [id number] Subjective/Complaints: No New complaints 36y.o. 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 Admitted 02/16/2013 with complaints of headache and left-sided weakness with altered mental status. INR on admission of 1.14 with noted poor compliance on Coumadin. MRI of the brain showed acute infarct right MCA territory without hemorrhage as well as scattered small areas of acute infarct left frontal and parietal lobe. Patient did not receive TPA. Echocardiogram with ejection fraction of 60% without embolism. 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 There was a filling defect involving the innominate artery thought likely to represent thrombus. There was also occlusion of the right cavernous carotid and right M1 segment indicate of embolic phenomenon. Underwent ICA thrombectomy 02/16/2013 per interventional radiology. Patient had been placed on aspirin therapy per neurology services and Coumadin later resumed of which patient adamantly refused and patient was placed thus patient was placed on Xarelto for stroke prevention 02/20/2013  Standing better with OT No pain C/Os  Review of Systems   Weak R shoulder , had neck pain R side yesterday All other systems reviewed and are negative.    Objective: Vital Signs: Blood pressure 169/94, pulse 80, temperature 98.4 F (36.9 C), temperature source Oral, resp. rate 18, weight 65.5 kg (144 lb 6.4 oz), last menstrual period 02/15/2013, SpO2 99.00%. No results found. No results found for this or any previous visit (from the past 72 hour(s)).   HEENT: normal Gen:  NAD Heart regular rate and rhythm no rubs murmurs or exercise Abdomen positive bowel sounds soft  nontender palpation Extremities no clubbing cyanosis or edema Musculoskeletal no shoulder pain with range of motion left side.+ subluxation Motor strength 5/5 in the right deltoid, biceps, triceps, grip, hip flexor, knee extensors, ankle dorsiflexor plantar flexor 0 in the left deltoid, biceps, triceps, grip 0. 2- minus in the hip flexor, knee extensor, ankle dorsiflexor, and toe flexor extensors Sensation is able to identify light touch in the left upper and left lower extremity as well as on the right side. 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 Functional deficits secondary to R MCA infarct which require 3+ hours per day of interdisciplinary therapy in a comprehensive inpatient rehab setting. Physiatrist is providing close team supervision and 24 hour management of active medical problems listed below. Physiatrist and rehab team continue to assess barriers to discharge/monitor patient progress toward functional and medical goals. FIM: FIM - Bathing Bathing Steps Patient Completed: Chest;Left Arm;Abdomen;Front perineal area;Right upper leg;Left upper leg;Right lower leg (including foot);Buttocks;Left lower leg (including foot) Bathing: 4: Min-Patient completes 8-9 33f 10 parts or 75+ percent  FIM - Upper Body Dressing/Undressing Upper body dressing/undressing steps patient completed: Thread/unthread right bra strap;Thread/unthread left bra strap;Thread/unthread right sleeve of pullover shirt/dresss;Put head through opening of pull over shirt/dress;Pull shirt over trunk Upper body dressing/undressing: 4: Min-Patient completed 75 plus % of tasks FIM - Lower Body Dressing/Undressing Lower body dressing/undressing steps patient completed:  Thread/unthread right underwear leg;Thread/unthread left underwear leg;Pull underwear up/down;Thread/unthread right pants leg;Thread/unthread left pants leg;Pull pants up/down;Don/Doff left shoe;Don/Doff  right shoe Lower body dressing/undressing: 4: Min-Patient completed 75 plus % of tasks  FIM - Toileting Toileting steps completed by patient: Performs perineal hygiene Toileting Assistive Devices: Grab bar or rail for support Toileting: 2: Max-Patient completed 1 of 3 steps  FIM - Diplomatic Services operational officer Devices: Elevated toilet seat;Grab bars Toilet Transfers: 3-To toilet/BSC: Mod A (lift or lower assist);3-From toilet/BSC: Mod A (lift or lower assist)  FIM - Bed/Chair Transfer Bed/Chair Transfer Assistive Devices: Arm rests Bed/Chair Transfer: 4: Bed > Chair or W/C: Min A (steadying Pt. > 75%);4: Chair or W/C > Bed: Min A (steadying Pt. > 75%)  FIM - Locomotion: Wheelchair Distance: 200 feet Locomotion: Wheelchair: 5: Travels 150 ft or more: maneuvers on rugs and over door sills with supervision, cueing or coaxing FIM - Locomotion: Ambulation Locomotion: Ambulation Assistive Devices: Chief Operating Officer Ambulation/Gait Assistance: 4: Min assist Locomotion: Ambulation: 2: Travels 50 - 149 ft with minimal assistance (Pt.>75%)  Comprehension Comprehension Mode: Auditory Comprehension: 5-Understands complex 90% of the time/Cues < 10% of the time  Expression Expression Mode: Verbal Expression: 5-Expresses complex 90% of the time/cues < 10% of the time  Social Interaction Social Interaction Mode: Asleep Social Interaction: 4-Interacts appropriately 75 - 89% of the time - Needs redirection for appropriate language or to initiate interaction.  Problem Solving Problem Solving: 3-Solves basic 50 - 74% of the time/requires cueing 25 - 49% of the time  Memory Memory: 4-Recognizes or recalls 75 - 89% of the time/requires cueing 10 - 24% of the time  Medical Problem List  and Plan:  1. Thromboembolic right MCA infarct laeft hemiparesis, combination of Takayasu's arteritis and hyper coagulable state. If pt starts ambulating more may need sling to control LUE Hold off on AFO decisions until next week given good LE recovery 2. DVT Prophylaxis/Anticoagulation: IVC filter/ Xarelto.Lupus anticoagulant antibody positive , non compliant with WarfarinMonitor for any signs of bleeding  3. Neuropsych: This patient is not capable of making decisions on his/her own behalf.  -family supportive 4. History of lupus anticoag Pos, no other clinical signs of SLE Continue prednisone for Takayasu's arteritis, CBGs look ok 5. MRSA positive nasal swab. Contact precautions and remains afebrile 6. History of medical noncompliance. Counseling  7. Microcytic anemia. Latest hemoglobin 9.4 on 02/28/2013. Followup CBC  8.  Chronic constipation add senna S- pt  inaccurate with bowel reports. Adjust bowel program as needed 9.  Leukocytosis on chronic steroid, afebrile 10.  Elevated BP this am, appears to be outlier with monitor LOS (Days) 23 A FACE TO FACE EVALUATION WAS PERFORMED  KIRSTEINS,ANDREW E 03/16/2013, 8:39 AM

## 2013-03-16 NOTE — Progress Notes (Signed)
Occupational Therapy Weekly Progress Note  Patient Details  Name: [name] MRN: [id number] Date of Birth: 01-17-1976  Today's Date: 03/16/2013 Time: 0800-0900 and 1100-1130 Time Calculation (min): 60 min and 30 min  Patient has met 5 of 5 short term goals.  Pt is making excellent progress in all areas. She has improved LLE control to allow her to have improved standing balance, improved trunk extension for left shoulder alignment, and improved attention.  Slight tone is developing in LUE elbow flexors.  Patient continues to demonstrate the following deficits: LUE/LLE hemiparesis, left inattention 25% of time, decreased standing balance, and therefore will continue to benefit from skilled OT intervention to enhance overall performance with BADL.  Patient progressing toward long term goals. Continue plan of care.  OT Short Term Goals Week 1:  OT Short Term Goal 1 (Week 1): Pt.  will maintain midline control for 5 minutes during grooming activity OT Short Term Goal 1 - Progress (Week 1): Met OT Short Term Goal 2 (Week 1): Pt. will perform grooming with mod assist OT Short Term Goal 2 - Progress (Week 1): Met OT Short Term Goal 3 (Week 1): Pt. will perform UB bathing with minimal assist and max with portural control OT Short Term Goal 3 - Progress (Week 1): Met OT Short Term Goal 4 (Week 1): Pt. will bathe LB with mod assist and max assist with sitting balance OT Short Term Goal 4 - Progress (Week 1): Met OT Short Term Goal 5 (Week 1): Pt. will dress UB with mod assist OT Short Term Goal 5 - Progress (Week 1): Met Week 2:  OT Short Term Goal 1 (Week 2): Pt will be able to transfer on and off a tub bench with mod assist. OT Short Term Goal 1 - Progress (Week 2): Met OT Short Term Goal 2 (Week 2): Pt will be able to maintain sitting balance on tub bench with min assist to demonstrate improved trunk control. OT Short Term Goal 2 - Progress (Week 2): Met OT Short Term Goal 3 (Week 2): Pt  will demonstrate improved left side awareness by finding items on her left side with minimal cuing. OT Short Term Goal 3 - Progress (Week 2): Met OT Short Term Goal 4 (Week 2): Pt will transfer on and off toilet with min assist. OT Short Term Goal 4 - Progress (Week 2): Met OT Short Term Goal 5 (Week 2): Pt will be able to stand statically with min assist with RUE support to allow caregivers to assist her with clothing management. OT Short Term Goal 5 - Progress (Week 2): Met Week 3:  OT Short Term Goal 1 (Week 3): Pt will ambulate in/out of bathroom to shower and toilet with steady assist with hemiwalker. OT Short Term Goal 2 (Week 3): Pt will dress UB with min assist only to fasten bra. OT Short Term Goal 3 (Week 3): Pt will don underwear and pants with steady assist. OT Short Term Goal 4 (Week 3): Pt will toilet with steady assist. OT Short Term Goal 4 - Progress (Week 3): Met OT Short Term Goal 5 (Week 3): Pt will be able to perform self ROM with min assist.    Skilled Therapeutic Interventions/Progress Updates:    Visit 1: No c/o pain.   Pt seen for BADL retraining of bathing and dressing with a focus on functional mobility of ambulating to bathroom and standing balance. Pt was able to sit to EOB with supervision, come into standing with  steady assist and ambulate to bathroom with hemiwalker with min assist to stabilize left hip.  She transferred onto tub bench and completed bathing with steady assist when she stood to wash her bottom.  She then ambulated back to w/c to complete dressing. Min assist UB , assist with shoes.  Pt demonstrated much improved attention to task, improved processing time, and overall motivation.  Excellent progress with standing balance and mobility. She had increased tone in LUE as she held in slightly flexed when walking.  Visit 2: Pt stated she was exhausted after PT session, but she was willing to get OOB and go to therapy.  Applied Bioness to pt but a comfortable  fit could not be achieved. Pt did not like the feeling of vibration through the wrist piece.  Changed to estim unit to facilitate wrist extension.  Pt had stim level of 25 at 35 pps for 10 sec on, 20 off for 15 min.  On off cycle, facilitated finger flexion with towel squeezes . No active movement observed today.  Therapy Documentation Precautions:  Precautions Precautions: Fall Precaution Comments: delayed processing; impulsive, left inattention Restrictions Weight Bearing Restrictions: No   ADL: See FIM for current functional status  Therapy/Group: Individual Therapy  Sirenity Shew 03/16/2013, 10:01 AM

## 2013-03-16 NOTE — Progress Notes (Signed)
Physical Therapy Weekly Progress Note  Patient Details  Name: [name] MRN: [id number] Date of Birth: 08-08-1976  Today's Date: 03/16/2013 Time: 0900-1000 Time Calculation (min): 60 min  Short Term Goals: Week 3:  PT Short Term Goal 1 (Week 3): Patient will perform bed mobility with supervision including rolling to right and left, and supine to and from sit PT Short Term Goal 1 - Progress (Week 3): Met PT Short Term Goal 2 (Week 3): Patient will perform wheelchair to bed/mat transfers with min assist PT Short Term Goal 2 - Progress (Week 3): Met PT Short Term Goal 3 (Week 3): Patient will ambulate 50 feet with hemi walker and min assist. PT Short Term Goal 3 - Progress (Week 3): Partly met PT Short Term Goal 4 (Week 3): Patient will ambulate up and down 5 steps with min assist. PT Short Term Goal 4 - Progress (Week 3): Not met  Pt has achieved 2/4 STGs, partly met 1/4 goals, and not met 1/4 goals.  Pt is inconsistent with performance during gait and stairs, but has ambulated with min assist at times.  She has had limited practice on stairs.  Pt continues to demonstrate the following deficits: L inattention, distractibility, increased processing time, LUE and LLE limited motor control (although LLE isolated movements including knee flexion are progressing), balance, dependence for functional mobility.  She is scheduled for specialty w/c eval and L AFO assessment this week.  D/c planned for 03/20/13.  Skilled Therapeutic Interventions/Progress Updates:  Treatment focused on neuromuscular re-education via manual cues, VCs, demo, visual feedback for L hip isolated movements in sitting, kicking a ball with LLE in standing, L knee flexion in R sidelying, gait x 75' with HW and L AFO, with mod assist, working on L hip activation for L stance stability.  In tall kneeling, pt focused on L hip stance stability, and in R sidelying, pt focused on L knee isolated flexion/extension  W/c mobility x 200'  with distant supervision; w/c parts management with cues.    Therapy Documentation Precautions:  Precautions Precautions: Fall Precaution Comments: delayed processing; impulsive, left inattention Restrictions Weight Bearing Restrictions: No   Pain: Pain Assessment Pain Assessment: No/denies pain   Locomotion : Ambulation Ambulation/Gait Assistance: 3: Mod assist Wheelchair Mobility Distance: 200    Other Treatments: Treatments Neuromuscular Facilitation: Left;Lower Extremity;Activity to increase coordination;Activity to increase timing and sequencing;Activity to increase motor control;Activity to increase grading;Activity to increase sustained activation;Activity to increase lateral weight shifting;Activity to increase anterior-posterior weight shifting Weight Bearing Technique Weight Bearing Technique: Yes LUE Weight Bearing Technique: High kneeling  See FIM for current functional status  Therapy/Group: Individual Therapy  Curlee Bogan 03/16/2013, 3:55 PM

## 2013-03-17 ENCOUNTER — Inpatient Hospital Stay (HOSPITAL_COMMUNITY): Payer: Medicare Other | Admitting: *Deleted

## 2013-03-17 ENCOUNTER — Inpatient Hospital Stay (HOSPITAL_COMMUNITY): Payer: Medicare Other | Admitting: Physical Therapy

## 2013-03-17 ENCOUNTER — Inpatient Hospital Stay (HOSPITAL_COMMUNITY): Payer: Medicare Other | Admitting: Occupational Therapy

## 2013-03-17 ENCOUNTER — Inpatient Hospital Stay (HOSPITAL_COMMUNITY): Payer: Medicare Other | Admitting: Speech Pathology

## 2013-03-17 NOTE — Progress Notes (Signed)
Patient ID: [name], female   DOB: 08/13/1976, 37 y.o.   MRN: [id number] Subjective/Complaints: No New complaints 36y.o. 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 Admitted 02/16/2013 with complaints of headache and left-sided weakness with altered mental status. INR on admission of 1.14 with noted poor compliance on Coumadin. MRI of the brain showed acute infarct right MCA territory without hemorrhage as well as scattered small areas of acute infarct left frontal and parietal lobe. Patient did not receive TPA. Echocardiogram with ejection fraction of 60% without embolism. 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 There was a filling defect involving the innominate artery thought likely to represent thrombus. There was also occlusion of the right cavernous carotid and right M1 segment indicate of embolic phenomenon. Underwent ICA thrombectomy 02/16/2013 per interventional radiology. Patient had been placed on aspirin therapy per neurology services and Coumadin later resumed of which patient adamantly refused and patient was placed thus patient was placed on Xarelto for stroke prevention 02/20/2013  Shoulder pain each month with menstrual period No shoulder trauma Also had ankle pain after amb to BR this am Review of Systems   Weak R shoulder , had neck pain R side yesterday All other systems reviewed and are negative.    Objective: Vital Signs: Blood pressure 125/74, pulse 83, temperature 98.2 F (36.8 C), temperature source Oral, resp. rate 19, weight 65.5 kg (144 lb 6.4 oz), last menstrual period 02/15/2013, SpO2 97.00%. No results found. No results found for this or any previous visit (from the past 72 hour(s)).   HEENT: normal Gen:  NAD Heart regular rate and  rhythm no rubs murmurs or exercise Abdomen positive bowel sounds soft nontender palpation Extremities no clubbing cyanosis or edema Musculoskeletal no shoulder pain with range of motion left side.+ subluxation, R shoulder neg pain to palp and ROM, Left ankle without swelling or pain to palp, pulses normal Motor strength 5/5 in the right deltoid, biceps, triceps, grip, hip flexor, knee extensors, ankle dorsiflexor plantar flexor 0 in the left deltoid, biceps, triceps, grip 0. 2- minus in the hip flexor, knee extensor, ankle dorsiflexor, and toe flexor extensors Sensation is able to identify light touch in the left upper and left lower extremity as well as on the right side. 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 Functional deficits secondary to R MCA infarct which require 3+ hours per day of interdisciplinary therapy in a comprehensive inpatient rehab setting. Physiatrist is providing close team supervision and 24 hour management of active medical problems listed below. Physiatrist and rehab team continue to assess barriers to discharge/monitor patient progress toward functional and medical goals. FIM: FIM - Bathing Bathing Steps Patient Completed: Chest;Right Arm;Left Arm;Abdomen;Front perineal area;Buttocks;Right upper leg;Left upper leg;Right lower leg (including foot);Left lower leg (including foot) Bathing: 4: Steadying assist  FIM - Upper Body Dressing/Undressing Upper body dressing/undressing steps patient completed: Thread/unthread right bra strap;Thread/unthread left bra strap;Thread/unthread right sleeve of pullover shirt/dresss;Put head through opening of pull over shirt/dress;Pull shirt over trunk Upper body dressing/undressing: 4:  Min-Patient completed 75 plus % of tasks FIM - Lower Body Dressing/Undressing Lower  body dressing/undressing steps patient completed: Thread/unthread right underwear leg;Thread/unthread left underwear leg;Pull underwear up/down;Thread/unthread right pants leg;Thread/unthread left pants leg;Pull pants up/down;Don/Doff left shoe;Don/Doff right shoe Lower body dressing/undressing: 3: Mod-Patient completed 50-74% of tasks  FIM - Toileting Toileting steps completed by patient: Performs perineal hygiene Toileting Assistive Devices: Grab bar or rail for support Toileting: 2: Max-Patient completed 1 of 3 steps  FIM - Diplomatic Services operational officer Devices: Elevated toilet seat;Grab bars Toilet Transfers: 3-To toilet/BSC: Mod A (lift or lower assist);3-From toilet/BSC: Mod A (lift or lower assist)  FIM - Bed/Chair Transfer Bed/Chair Transfer Assistive Devices: Arm rests;Orthosis Bed/Chair Transfer: 5: Sit > Supine: Supervision (verbal cues/safety issues);5: Supine > Sit: Supervision (verbal cues/safety issues);5: Chair or W/C > Bed: Supervision (verbal cues/safety issues)  FIM - Locomotion: Wheelchair Distance: 200 Locomotion: Wheelchair: 5: Travels 150 ft or more: maneuvers on rugs and over door sills with supervision, cueing or coaxing FIM - Locomotion: Ambulation Locomotion: Ambulation Assistive Devices: Environmental consultant - Hemi;Orthosis Ambulation/Gait Assistance: 3: Mod assist Locomotion: Ambulation: 1: Travels less than 50 ft with moderate assistance (Pt: 50 - 74%)  Comprehension Comprehension Mode: Auditory Comprehension: 5-Understands complex 90% of the time/Cues < 10% of the time  Expression Expression Mode: Verbal Expression: 5-Expresses complex 90% of the time/cues < 10% of the time  Social Interaction Social Interaction Mode: Asleep Social Interaction: 4-Interacts appropriately 75 - 89% of the time - Needs redirection for appropriate language or to initiate  interaction.  Problem Solving Problem Solving: 3-Solves basic 50 - 74% of the time/requires cueing 25 - 49% of the time  Memory Memory: 4-Recognizes or recalls 75 - 89% of the time/requires cueing 10 - 24% of the time  Medical Problem List and Plan:  1. Thromboembolic right MCA infarct left hemiparesis, combination of Takayasu's arteritis and hyper coagulable state. If pt starts ambulating more may need sling to control LUE Hold off on AFO decisions until next week given good LE recovery 2. DVT Prophylaxis/Anticoagulation: IVC filter/ Xarelto.Lupus anticoagulant antibody positive , non compliant with WarfarinMonitor for any signs of bleeding  3. Neuropsych: This patient is not capable of making decisions on his/her own behalf.  -family supportive 4. History of lupus anticoag Pos, no other clinical signs of SLE Continue prednisone for Takayasu's arteritis, CBGs look ok 5. MRSA positive nasal swab. Contact precautions and remains afebrile 6. History of medical noncompliance. Counseling  7. Microcytic anemia. Latest hemoglobin 9.4 on 02/28/2013. Followup CBC  8.  Chronic constipation add senna S- pt  inaccurate with bowel reports. Adjust bowel program as needed 9.  Leukocytosis on chronic steroid, afebrile 10.  Elevated BP this am, appears to be outlier with monitor LOS (Days) 24 A FACE TO FACE EVALUATION WAS PERFORMED  KIRSTEINS,ANDREW E 03/17/2013, 8:02 AM

## 2013-03-17 NOTE — Progress Notes (Signed)
Occupational Therapy Session Note  Patient Details  Name: NATIYA SEELINGER MRN: [id number] Date of Birth: December 27, 1975  Today's Date: 03/17/2013 Time: 0800-0900 and [id number]  Time Calculation (min): 60 min and 35 min  Short Term Goals: Week 1:  OT Short Term Goal 1 (Week 1): Pt.  will maintain midline control for 5 minutes during grooming activity OT Short Term Goal 1 - Progress (Week 1): Met OT Short Term Goal 2 (Week 1): Pt. will perform grooming with mod assist OT Short Term Goal 2 - Progress (Week 1): Met OT Short Term Goal 3 (Week 1): Pt. will perform UB bathing with minimal assist and max with portural control OT Short Term Goal 3 - Progress (Week 1): Met OT Short Term Goal 4 (Week 1): Pt. will bathe LB with mod assist and max assist with sitting balance OT Short Term Goal 4 - Progress (Week 1): Met OT Short Term Goal 5 (Week 1): Pt. will dress UB with mod assist OT Short Term Goal 5 - Progress (Week 1): Met Week 2:  OT Short Term Goal 1 (Week 2): Pt will be able to transfer on and off a tub bench with mod assist. OT Short Term Goal 1 - Progress (Week 2): Met OT Short Term Goal 2 (Week 2): Pt will be able to maintain sitting balance on tub bench with min assist to demonstrate improved trunk control. OT Short Term Goal 2 - Progress (Week 2): Met OT Short Term Goal 3 (Week 2): Pt will demonstrate improved left side awareness by finding items on her left side with minimal cuing. OT Short Term Goal 3 - Progress (Week 2): Met OT Short Term Goal 4 (Week 2): Pt will transfer on and off toilet with min assist. OT Short Term Goal 4 - Progress (Week 2): Met OT Short Term Goal 5 (Week 2): Pt will be able to stand statically with min assist with RUE support to allow caregivers to assist her with clothing management. OT Short Term Goal 5 - Progress (Week 2): Met Week 3:  OT Short Term Goal 1 (Week 3): Pt will ambulate in/out of bathroom to shower and toilet with steady assist with hemiwalker. OT  Short Term Goal 2 (Week 3): Pt will dress UB with min assist only to fasten bra. OT Short Term Goal 3 (Week 3): Pt will don underwear and pants with steady assist. OT Short Term Goal 4 (Week 3): Pt will toilet with steady assist. OT Short Term Goal 4 - Progress (Week 3): Met OT Short Term Goal 5 (Week 3): Pt will be able to perform self ROM with min assist.    Skilled Therapeutic Interventions/Progress Updates:    Visit 1: No c/o pain. Pt seen for ADL retraining of B/D at shower level with a focus on functional mobility and standing balance.  Pt was able to walk in and out of shower with hemicane with min assist to stabilize left hip and cues to advance foot.  In shower pt demonstrated good sitting balance.  To pull pants up and down, she was able to maintain standing with steady assist.  She needed cues to stay focused on task as she would often stop to ask why she could not practice transfers or walking by herself.  Pt left in chair with lap tray in place at end of session.  Pt is able to move around room in w/c to reach call light, phone, etc.  Visit 2: No c/o pain. Pt seen for LUE  neuro re-ed with estim to left wrist extensors.  Hand over hand guiding to have pt grasp a cup with left hand and bring it to her mouth on the off cycle, and open hand to release cup during on cycle.  Pt can feel the stimulation well, but no active carryover at this time in her entire LUE.  Pt provided with self ROM HEP. Worked on all exercises from w/c except for shoulder ROM.  Pt taken back to her room.  Therapy Documentation Precautions:  Precautions Precautions: Fall Precaution Comments: delayed processing; impulsive, left inattention Restrictions Weight Bearing Restrictions: No    Pain: Pain Assessment Pain Assessment: No/denies pain Pain Score: 0-No pain ADL: See FIM for current functional status  Therapy/Group: Individual Therapy  SAGUIER,JULIA 03/17/2013, 12:02 PM

## 2013-03-17 NOTE — Progress Notes (Signed)
NUTRITION FOLLOW UP  Intervention:   Continue to encourage intake of regular diet. No changes needed at this time.  Nutrition Dx:   Increased nutrient needs related to acute illness/injury as evidenced by estimated needs. Ongoing.  Goal:   Intake to meet >90% of estimated nutrition needs. Met.  Monitor:   PO intake, labs, weight trend.  Assessment:   Intake of meals has been variable over the past few days, but patient is consuming ~75% of meals on average. Intake is adequate to meet estimated nutrition needs. Continues to work towards d/c on 4/25.  Height: Ht Readings from Last 1 Encounters:  02/16/13 5\' 8"  (1.727 m)    Weight Status:   Wt Readings from Last 1 Encounters:  02/27/13 144 lb 6.4 oz (65.5 kg)   No new weight available.  Re-estimated needs:  Kcal: 1650 - 1900  Protein: 90 - 100  Fluid: at least 1.5 liters daily  Skin: intact  Diet Order: General   Intake/Output Summary (Last 24 hours) at 03/17/13 1510 Last data filed at 03/17/13 1425  Gross per 24 hour  Intake    720 ml  Output      0 ml  Net    720 ml    Last BM: 4/20   Labs:  No results found for this basename: NA, K, CL, CO2, BUN, CREATININE, CALCIUM, MG, PHOS, GLUCOSE,  in the last 168 hours  CBG (last 3)  No results found for this basename: GLUCAP,  in the last 72 hours  Scheduled Meds: . methylphenidate  5 mg Oral BID WC  . multivitamin with minerals  1 tablet Oral Daily  . predniSONE  20 mg Oral Daily  . rivaroxaban  20 mg Oral Q supper  . senna-docusate  2 tablet Oral QHS  . temazepam  15 mg Oral QHS    Continuous Infusions:   Joaquin Courts, RD, LDN, CNSC Pager [phone] After Hours Pager [phone]

## 2013-03-17 NOTE — Progress Notes (Signed)
Recreational Therapy Session Note  Patient Details  Name: LINDSAY STRAKA MRN: [id number] Date of Birth: 1976/05/17 Today's Date: 03/17/2013 Time:  1430-1520 Pain: no c/o Skilled Therapeutic Interventions/Progress Updates: Pt seen seated in bed for computer task using internet/facebook with mod cues for problem solving & attention.  Discussed importance of leisure and potential activities for participation post discharge in which pt required mod cues.  Also discussed community pursuit and potential barriers.  Pt agreeable to outing if team feel appropriate.   Therapy/Group: Individual Therapy   Rozlyn Yerby 03/17/2013, 4:56 PM

## 2013-03-17 NOTE — Progress Notes (Signed)
Speech Language Pathology Daily Session Note & Weekly Progress Report  Patient Details  Name: [name] MRN: [id number] Date of Birth: Oct 11, 1976  Today's Date: 03/17/2013 Time: 0950-1030 Time Calculation (min): 40 min  Short Term Goals: Week 3: SLP Short Term Goal 1 (Week 3): Patient will attend to left visual field during a functional task or conversation with min clinician cues.  SLP Short Term Goal 2 (Week 3): Patient will sustain attention to basic functional ADL for 5 minutes with mod clinician cues.  SLP Short Term Goal 3 (Week 3): Patient will demonstrate emergent awareness of deficits by requesting help as needed with moderate questions cues.  SLP Short Term Goal 4 (Week 3): Patient will self-monitor and correct errors with functional tasks with moderate question cues.  SLP Short Term Goal 5 (Week 3): Patient will read passages with intiation with mod assist clinician cues.  Skilled Therapeutic Interventions: Skilled treatment session focused on addressing cognitive goals. SLP facilitated session with external aid and min-mod assist verbal cues to divide attention during route finding task for 30 minutes in a familiar environment and mod assist cues to locate things located on left.  SLP also facilitated session with min verbal cues to recall current medications as part of a discharge discussion; patient also required min assist cues to anticipate needs and what type of assist she will need after discharge.  Continue plan of care.   FIM:  Comprehension Comprehension Mode: Auditory Comprehension: 5-Understands complex 90% of the time/Cues < 10% of the time Expression Expression Mode: Verbal Expression: 5-Expresses complex 90% of the time/cues < 10% of the time Social Interaction Social Interaction: 4-Interacts appropriately 75 - 89% of the time - Needs redirection for appropriate language or to initiate interaction. Problem Solving Problem Solving: 3-Solves basic 50 - 74% of  the time/requires cueing 25 - 49% of the time Memory Memory: 4-Recognizes or recalls 75 - 89% of the time/requires cueing 10 - 24% of the time FIM - Eating Eating Activity: 5: Set-up assist for open containers  Pain Pain Assessment Pain Assessment: No/denies pain Pain Score: 0-No pain  Therapy/Group: Individual Therapy   Speech Language Pathology Weekly Progress Note  Patient Details  Name: [name] MRN: [id number] Date of Birth: 04-Feb-1976  Today's Date: 03/17/2013  Short Term Goals: Week 3: SLP Short Term Goal 1 (Week 3): Patient will attend to left visual field during a functional task or conversation with min clinician cues.  SLP Short Term Goal 1 - Progress (Week 3): Progressing toward goal SLP Short Term Goal 2 (Week 3): Patient will sustain attention to basic functional ADL for 5 minutes with mod clinician cues.  SLP Short Term Goal 2 - Progress (Week 3): Met SLP Short Term Goal 3 (Week 3): Patient will demonstrate emergent awareness of deficits by requesting help as needed with moderate questions cues.  SLP Short Term Goal 3 - Progress (Week 3): Met SLP Short Term Goal 4 (Week 3): Patient will self-monitor and correct errors with functional tasks with moderate question cues.  SLP Short Term Goal 4 - Progress (Week 3): Met SLP Short Term Goal 5 (Week 3): Patient will read passages with intiation with mod assist clinician cues. SLP Short Term Goal 5 - Progress (Week 3): Partly met Week 4: SLP Short Term Goal 1 (Week 4): Patient will attend to left visual field during a functional task or conversation with min clinician cues.  SLP Short Term Goal 2 (Week 4): Patient will alternate attention between  tasks for 5 minutes with min clinician cues.  SLP Short Term Goal 3 (Week 4): Patient will demonstrate emergent awareness of deficits by requesting help as needed with min assist questions cues.  SLP Short Term Goal 4 (Week 4): Patient will self-monitor and correct errors with  functional tasks with min question cues.  SLP Short Term Goal 5 (Week 4): Patient wil utilize external aids to assist with recall with min assist question cues.  Weekly Progress Updates: Patient met 4 out of 5 short term goals this reporting period.  Patient made gains in attention, awareness, problem solving with self-monitoring and correcting as well as increased use of intonation during spontaneous speech.  Patient continues to require min-mod assist with familiar tasks and this next reporting periods goals are set for min assist.  This reporting period will also focus on addressing caregiver education prior to discharge home with mother.  As a result, this patient continues to required skilled SLP services in this venue of care and then next.     SLP Intensity: Minumum of 1-2 x/day, 30 to 90 minutes SLP Frequency: 5 out of 7 days SLP Duration/Estimated Length of Stay: 03/20/13 SLP Treatment/Interventions: Cognitive remediation/compensation;Cueing hierarchy;Environmental controls;Functional tasks;Internal/external aids;Medication managment;Patient/family education;Speech/Language facilitation;Therapeutic Activities  Charlane Ferretti., CCC-SLP [phone]  Jancy Sprankle 03/17/2013, 11:22 AM

## 2013-03-17 NOTE — Progress Notes (Signed)
Physical Therapy Note  Patient Details  Name: [name] MRN: [id number] Date of Birth: 1976/03/25 Today's Date: 03/17/2013  1300-1355 (55 minutes) individual Pain : pt reports intermittent unrated pain RT shoulder/ nurse notified Focus of treatment: Neuro re-ed Left LE; therapeutic exercise focused on LT LE strengthening/activity tolerance; gait training including steps/gait without AD to increase weight bearing Lt LE Treatment: wc mobility - SBA unit using RT extremities; transfers min assist HW with supervision setup; sit to supine min assist LT LE (pt attempts to use RT extremity to assist); supine to sit (mat) SBA; Neuro re-ed LT LE X 15- AA heel slides with emphasis on eccentric control; AA hip abduction; AA hip ER/IR in hooklying; hip adduction with isometric squeeze on football; Nustep Level 4 LEs only; gait min assist to intermittent mod assist for balance without AD (AFO left). Pt requires increased time to initiate activity.    [employer],JIM 03/17/2013, 1:59 PM

## 2013-03-17 NOTE — Evaluation (Signed)
Recreational Therapy Assessment and Plan  Patient Details  Name: [name] MRN: [id number] Date of Birth: [birth year] Today's Date: 03/17/2013  Rehab Potential: Good ELOS: 10 days   Assessment Clinical Impression: Problem List:  Patient Active Problem List   Diagnosis   .  ANEMIA, IRON DEFICIENCY   .  DEPRESSION   .  TAKAYASU'S ARTERITIS   .  THROMBOSIS, VENOUS   .  CONSTIPATION   .  OVARIAN CYST   .  FOOT DROP, LEFT   .  CVA (cerebral infarction)   .  Subtherapeutic international normalized ratio (INR)   .  Postprocedural respiratory failure   .  Essential hypertension, malignant   .  Sinus tachycardia    Past Medical History:  Past Medical History   Diagnosis  Date   .  PVD (peripheral vascular disease)    .  Benign tumor of back    .  Swelling, lymph nodes  2006     intermittant, benign   .  DVT (deep venous thrombosis)    .  Stroke      last week here    Past Surgical History:  Past Surgical History   Procedure  Laterality  Date   .  Ivc filter placement     .  Breast surgery       bil breast implants    Assessment & Plan  Clinical Impression: [name] is a 37 y.o. 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 Admitted 02/16/2013 with complaints of headache and left-sided weakness with altered mental status. INR on admission of 1.14 with noted poor compliance on Coumadin. MRI of the brain showed acute infarct right MCA territory without hemorrhage as well as scattered small areas of acute infarct left frontal and parietal lobe. Patient did not receive TPA. Echocardiogram with ejection fraction of 60% without embolism. 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 There was a filling defect involving the innominate artery thought likely to  represent thrombus. There was also occlusion of the right cavernous carotid and right M1 segment indicate of embolic phenomenon. Underwent ICA thrombectomy 02/16/2013 per interventional radiology. Patient had been placed on aspirin therapy per neurology services and Coumadin later resumed of which patient adamantly refused and patient was placed thus patient was placed on Xarelto for stroke prevention 02/20/2013 . Currently maintained on a dysphagia 3 thin liquid diet. Physical and occupational therapy evaluations completed 02/18/2013 with recommendations of physical medicine rehabilitation consult to consider inpatient rehabilitation services. Patient was felt to be a good candidate for inpatient rehabilitation services and was admitted for comprehensive rehabilitation program today. Patient transferred to CIR on 02/21/2013.   Pt presents with decreased activity tolerance, decreased functional mobility, decreased balance, decreased awareness, decreased problem solving, decreased safety awareness, decreased memory and delayed processing, decreased leisure awareness limiting pt's independence with leisure & community pursuits. Leisure History/Participation Premorbid leisure interest/current participation: Community - Other (Comment) (out to eat)  Plan Rec Therapy Plan Is patient appropriate for Therapeutic Recreation?: Yes Rehab Potential: Good Treatment times per week: Min 1 time per week >20 minutes Estimated Length of Stay: 10 days TR Treatment/Interventions: Adaptive equipment instruction;1:1 session;Balance/vestibular training;Community reintegration;Cognitive remediation/compensation;Patient/family education;Therapeutic activities;Recreation/leisure participation;Leisure education;Wheelchair propulsion/positioning Recommendations for other services: Neuropsych  Recommendations for other services: Neuropsych  Discharge Criteria: Patient will be discharged from  TR if patient refuses treatment 3  consecutive times without medical reason.  If treatment goals not met, if there is a change in medical status, if patient makes no progress towards goals or if patient is discharged from hospital.  The above assessment, treatment plan, treatment alternatives and goals were discussed and mutually agreed upon: by patient  [name] 03/17/2013, 4:51 PM

## 2013-03-18 ENCOUNTER — Inpatient Hospital Stay (HOSPITAL_COMMUNITY): Payer: Medicare Other

## 2013-03-18 ENCOUNTER — Inpatient Hospital Stay (HOSPITAL_COMMUNITY): Payer: Medicare Other | Admitting: Speech Pathology

## 2013-03-18 ENCOUNTER — Inpatient Hospital Stay (HOSPITAL_COMMUNITY): Payer: Medicare Other | Admitting: Occupational Therapy

## 2013-03-18 ENCOUNTER — Inpatient Hospital Stay (HOSPITAL_COMMUNITY): Payer: Medicare Other | Admitting: Physical Therapy

## 2013-03-18 NOTE — Progress Notes (Signed)
Occupational Therapy Session Note  Patient Details  Name: [name] MRN: [id number] Date of Birth: [birth year]  Today's Date: 03/18/2013 Time: 0805-0905 and 1100-1130 Time Calculation (min): 60 min and 30 min   Short Term Goals: Week 1:  OT Short Term Goal 1 (Week 1): Pt.  will maintain midline control for 5 minutes during grooming activity OT Short Term Goal 1 - Progress (Week 1): Met OT Short Term Goal 2 (Week 1): Pt. will perform grooming with mod assist OT Short Term Goal 2 - Progress (Week 1): Met OT Short Term Goal 3 (Week 1): Pt. will perform UB bathing with minimal assist and max with portural control OT Short Term Goal 3 - Progress (Week 1): Met OT Short Term Goal 4 (Week 1): Pt. will bathe LB with mod assist and max assist with sitting balance OT Short Term Goal 4 - Progress (Week 1): Met OT Short Term Goal 5 (Week 1): Pt. will dress UB with mod assist OT Short Term Goal 5 - Progress (Week 1): Met Week 2:  OT Short Term Goal 1 (Week 2): Pt will be able to transfer on and off a tub bench with mod assist. OT Short Term Goal 1 - Progress (Week 2): Met OT Short Term Goal 2 (Week 2): Pt will be able to maintain sitting balance on tub bench with min assist to demonstrate improved trunk control. OT Short Term Goal 2 - Progress (Week 2): Met OT Short Term Goal 3 (Week 2): Pt will demonstrate improved left side awareness by finding items on her left side with minimal cuing. OT Short Term Goal 3 - Progress (Week 2): Met OT Short Term Goal 4 (Week 2): Pt will transfer on and off toilet with min assist. OT Short Term Goal 4 - Progress (Week 2): Met OT Short Term Goal 5 (Week 2): Pt will be able to stand statically with min assist with RUE support to allow caregivers to assist her with clothing management. OT Short Term Goal 5 - Progress (Week 2): Met Week 3:  OT Short Term Goal 1 (Week 3): Pt will ambulate in/out of bathroom to shower and toilet with steady assist with hemiwalker. OT  Short Term Goal 2 (Week 3): Pt will dress UB with min assist only to fasten bra. OT Short Term Goal 3 (Week 3): Pt will don underwear and pants with steady assist. OT Short Term Goal 4 (Week 3): Pt will toilet with steady assist. OT Short Term Goal 4 - Progress (Week 3): Met OT Short Term Goal 5 (Week 3): Pt will be able to perform self ROM with min assist.    Skilled Therapeutic Interventions/Progress Updates:    Visit 1:   No c/o pain. Pt seen for BADL retraining of bathing and dressing in ADL apartment with a focus on tub bench transfers in tub, standing balance, and family education with mother.  Pt did well coming from sit to stand from w/c and taking 8-10 steps with hemi walker to tub bench. Educated mother on how to facilitate walking with support through left him.  Pt needs min assist to lift left leg in/out of tub.  After bath, pt stood to transfer back to chair.  She had not steadied her left leg and when she took a step with right, both legs buckled and pt started to fall. Her mother was guarding her properly and was able to efficienty and safely stabilize her.  They then continued the transfer. Pt was able to  bathe self with supervision with lateral leans on bench and pull pants up with steady assist. Pt will need elastic shoe laces.  Pt and mother returned to her room for PT session.  Visit 2: Pt seen this session for continued family education with patient's mom to address [facility] and toilet transfers using hemi walker in ADL apartment. Pt used walker to walk w/c to bed, worked on bed  Mobility (min assist sit to supine to lift left leg, supervision supine to sit).  A BSC was placed next to bed and pt worked on standpivot transfers bed to to [facility] and back with min assist and cues for LLE positioning with her mother facilitating.  Her mother then practiced walking her to the bathroom (about 10 feet) and back with min assist.  Her mother stated that she is definitely feeling more comfortable with  the transfers and self care.  Reviewed her HEP briefly, will need to spend more time on this tomorrow.  Discussed precautions with hemiplegic shoulder/subluxation.  She plans to come tomorrow for more training. Pt and her mother went back to her room.    Therapy Documentation Precautions:  Precautions Precautions: Fall Precaution Comments: delayed processing; impulsive, left inattention Restrictions Weight Bearing Restrictions: No    ADL: See FIM for current functional status  Therapy/Group: Individual Therapy  Beckem Tomberlin 03/18/2013, 9:24 AM

## 2013-03-18 NOTE — Progress Notes (Signed)
Physical Therapy Session Note  Patient Details  Name: MARIETA MARKOV MRN: [id number] Date of Birth: 08-22-1976  Today's Date: 03/18/2013 Time: 1100-1130 and 2956-2130 Time Calculation (min): 30 min and 40 min  Short Term Goals: Week 4: LTGs  Skilled Therapeutic Interventions/Progress Updates:   1st treatment: Treatment focused on family ed for ambulation and orthotic fitting with Scarlette Slice of [employer] P [employer].  New Allard Toe Off brace delivered, used with heel lift underneath.  Shoe recommendations, use of AFO discussed with pt and mother.  Mother trained in ambulation with HW x 15' with min assist, wearing personal Allard AFO. Pt continues to need min assist and mod VCS to assist for sequencing, L stance stability via L hip extension.  W/c mobility using hemi technique with distant supervision x 200'.  2nd treatment: Newman Nickels ATP from [employer] here for specialty w/c eval.  neuromuscular re-education via manual cues, VCs, demo, manual cues for : -L pelvic activation for protraction/retraction isolated movements -L LE stance stability in static standing -LLE isolated knee flexion/extension and hip abduction/adduction in sitting     Therapy Documentation Precautions:  Precautions Precautions: Fall Precaution Comments: delayed processing; impulsive, left inattention Restrictions Weight Bearing Restrictions: No Pain: Pain Assessment Pain Assessment: No/denies pain   Other Treatments: Treatments Neuromuscular Facilitation: Left;Lower Extremity;Activity to increase coordination;Activity to increase timing and sequencing;Activity to increase motor control;Activity to increase grading;Activity to increase sustained activation;Activity to increase lateral weight shifting;Activity to increase anterior-posterior weight shifting  See FIM for current functional status  Therapy/Group: Individual Therapy  Kermit Arnette 03/18/2013, 2:31 PM

## 2013-03-18 NOTE — Progress Notes (Signed)
Patient ID: [name], female   DOB: 18-Mar-1976, 37 y.o.   MRN: [id number] Subjective/Complaints: No New complaints 37y.o. 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 Admitted 02/16/2013 with complaints of headache and left-sided weakness with altered mental status. INR on admission of 1.14 with noted poor compliance on Coumadin. MRI of the brain showed acute infarct right MCA territory without hemorrhage as well as scattered small areas of acute infarct left frontal and parietal lobe. Patient did not receive TPA. Echocardiogram with ejection fraction of 60% without embolism. 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 There was a filling defect involving the innominate artery thought likely to represent thrombus. There was also occlusion of the right cavernous carotid and right M1 segment indicate of embolic phenomenon. Underwent ICA thrombectomy 02/16/2013 per interventional radiology. Patient had been placed on aspirin therapy per neurology services and Coumadin later resumed of which patient adamantly refused and patient was placed thus patient was placed on Xarelto for stroke prevention 02/20/2013  Questions about left hand pain, duration of ritalin  Review of Systems  L hand occ burning pain All other systems reviewed and are negative.    Objective: Vital Signs: Blood pressure 150/86, pulse 69, temperature 98.2 F (36.8 C), temperature source Oral, resp. rate 20, weight 65.5 kg (144 lb 6.4 oz), last menstrual period 02/15/2013, SpO2 100.00%. No results found. No results found for this or any previous visit (from the past 72 hour(s)).   HEENT: normal Gen:  NAD Heart regular rate and rhythm no rubs murmurs or exercise Abdomen positive bowel sounds soft nontender  palpation Extremities no clubbing cyanosis or edema Musculoskeletal no shoulder pain with range of motion left side.+ subluxation, R shoulder neg pain to palp and ROM, Left ankle without swelling or pain to palp, pulses normal Motor strength 5/5 in the right deltoid, biceps, triceps, grip, hip flexor, knee extensors, ankle dorsiflexor plantar flexor 0 in the left deltoid, biceps, triceps, grip 0. 2- minus in the hip flexor, knee extensor, ankle dorsiflexor, and toe flexor extensors Sensation is able to identify light touch in the left upper and left lower extremity as well as on the right side. 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 Functional deficits secondary to R MCA infarct which require 3+ hours per day of interdisciplinary therapy in a comprehensive inpatient rehab setting. Physiatrist is providing close team supervision and 24 hour management of active medical problems listed below. Physiatrist and rehab team continue to assess barriers to discharge/monitor patient progress toward functional and medical goals. FIM: FIM - Bathing Bathing Steps Patient Completed: Chest;Right Arm;Left Arm;Abdomen;Front perineal area;Buttocks;Right upper leg;Left upper leg;Right lower leg (including foot);Left lower leg (including foot) Bathing: 4: Steadying assist  FIM - Upper Body Dressing/Undressing Upper body dressing/undressing steps patient completed: Thread/unthread right bra strap;Thread/unthread left bra strap;Thread/unthread right sleeve of pullover shirt/dresss;Put head through opening of pull over shirt/dress;Pull shirt over trunk Upper body dressing/undressing: 4: Min-Patient completed 75 plus % of tasks FIM - Lower Body Dressing/Undressing Lower  body dressing/undressing steps patient completed: Thread/unthread right underwear leg;Thread/unthread left underwear leg;Pull underwear up/down;Thread/unthread right pants leg;Thread/unthread  left pants leg;Pull pants up/down;Don/Doff left shoe;Don/Doff right shoe Lower body dressing/undressing: 4: Min-Patient completed 75 plus % of tasks  FIM - Toileting Toileting steps completed by patient: Performs perineal hygiene Toileting Assistive Devices: Grab bar or rail for support Toileting: 2: Max-Patient completed 1 of 3 steps  FIM - Diplomatic Services operational officer Devices: Elevated toilet seat;Grab bars Toilet Transfers: 3-To toilet/BSC: Mod A (lift or lower assist);3-From toilet/BSC: Mod A (lift or lower assist)  FIM - Bed/Chair Transfer Bed/Chair Transfer Assistive Devices: Arm rests;Orthosis Bed/Chair Transfer: 5: Supine > Sit: Supervision (verbal cues/safety issues)  FIM - Locomotion: Wheelchair Distance: 200 Locomotion: Wheelchair: 5: Travels 150 ft or more: maneuvers on rugs and over door sills with supervision, cueing or coaxing FIM - Locomotion: Ambulation Locomotion: Ambulation Assistive Devices: Environmental consultant - Hemi;Orthosis Ambulation/Gait Assistance: 3: Mod assist Locomotion: Ambulation: 1: Travels less than 50 ft with moderate assistance (Pt: 50 - 74%)  Comprehension Comprehension Mode: Auditory Comprehension: 5-Understands complex 90% of the time/Cues < 10% of the time  Expression Expression Mode: Verbal Expression: 5-Expresses basic 90% of the time/requires cueing < 10% of the time.  Social Interaction Social Interaction Mode: Asleep Social Interaction: 4-Interacts appropriately 75 - 89% of the time - Needs redirection for appropriate language or to initiate interaction.  Problem Solving Problem Solving: 3-Solves basic 50 - 74% of the time/requires cueing 25 - 49% of the time  Memory Memory: 4-Recognizes or recalls 75 - 89% of the time/requires cueing 10 - 24%  of the time  Medical Problem List and Plan:  1. Thromboembolic right MCA infarct left hemiparesis, combination of Takayasu's arteritis and hyper coagulable state. If pt starts ambulating more may need sling to control LUE Hold off on AFO decisions until next week given good LE recovery 2. DVT Prophylaxis/Anticoagulation: IVC filter/ Xarelto.Lupus anticoagulant antibody positive , non compliant with WarfarinMonitor for any signs of bleeding  3. Neuropsych: This patient is not capable of making decisions on his/her own behalf.  -family supportive 4. History of lupus anticoag Pos, no other clinical signs of SLE Continue prednisone for Takayasu's arteritis, CBGs look ok 5. MRSA positive nasal swab. Contact precautions and remains afebrile 6. History of medical noncompliance. Counseling  7. Microcytic anemia. Latest hemoglobin 9.4 on 02/28/2013. Followup CBC  8.  Chronic constipation add senna S- pt  inaccurate with bowel reports. Adjust bowel program as needed 9.  Leukocytosis on chronic steroid, afebrile 10.  Elevated BP this am, appears to be outlier with monitor 11.  Left hand dysesthesia, CVA related monitor , if it worsens will start gabapentin LOS (Days) 25 A FACE TO FACE EVALUATION WAS PERFORMED  Ugo Thoma E 03/18/2013, 9:17 AM

## 2013-03-18 NOTE — Progress Notes (Signed)
Physical Therapy Session Note  Patient Details  Name: [name] MRN: [id number] Date of Birth: 10/01/1976  Today's Date: 03/18/2013 Time: 4098-1191 Time Calculation (min): 29 min  Short Term Goals: Week 3:  PT Short Term Goal 1 (Week 3): Patient will perform bed mobility with supervision including rolling to right and left, and supine to and from sit PT Short Term Goal 1 - Progress (Week 3): Met PT Short Term Goal 2 (Week 3): Patient will perform wheelchair to bed/mat transfers with min assist PT Short Term Goal 2 - Progress (Week 3): Met PT Short Term Goal 3 (Week 3): Patient will ambulate 50 feet with hemi walker and min assist. PT Short Term Goal 3 - Progress (Week 3): Partly met PT Short Term Goal 4 (Week 3): Patient will ambulate up and down 5 steps with min assist. PT Short Term Goal 4 - Progress (Week 3): Not met  Skilled Therapeutic Interventions/Progress Updates:    This session focused on stairs and gait with hemi walker.  WC mobility 100' with hemi technique supervision (cues needed for obstacles).  Stairs with one railing mod assist to support trunk for balance and to stabilize left knee during stance.  Verbal cues for safety and upright posture (pt flexing at trunk sticking her buttocks posteriorly to extend her left leg, but then stays here while stepping up with her right leg).  Went up and down 3 steps x 2 to reinforce education.  Gait with hemi walker x 25' (x2) min assist to stabilize trunk for balance (especially while turning).  Verbal cues for upright posture and sequencing of legs.    Therapy Documentation Precautions:  Precautions Precautions: Fall Precaution Comments: delayed processing; impulsive, left inattention, left hemi peresis Restrictions Weight Bearing Restrictions: No   Vital Signs: Therapy Vitals Temp: 98.6 F (37 C) Temp src: Oral Pulse Rate: 107 Resp: 20 BP: 135/79 mmHg Patient Position, if appropriate: Sitting Oxygen Therapy SpO2: 100  % O2 Device: None (Room air) Pain: Pain Assessment Pain Assessment: No/denies pain   Locomotion : Ambulation Ambulation/Gait Assistance: 4: Min assist Wheelchair Mobility Distance: 100     Other Treatments: Treatments Neuromuscular Facilitation: Left;Lower Extremity;Activity to increase coordination;Activity to increase timing and sequencing;Activity to increase motor control;Activity to increase grading;Activity to increase sustained activation;Activity to increase lateral weight shifting;Activity to increase anterior-posterior weight shifting  See FIM for current functional status  Therapy/Group: Individual Therapy  Lurena Joiner B. Hadriel Northup, PT, DPT [phone]   03/18/2013, 4:16 PM

## 2013-03-18 NOTE — Progress Notes (Signed)
Speech Language Pathology Daily Session Note  Patient Details  Name: [name] MRN: [id number] Date of Birth: July 19, 1976  Today's Date: 03/18/2013 Time: 0945-1000 Time Calculation (min): 15 min  Short Term Goals: Week 4: SLP Short Term Goal 1 (Week 4): Patient will attend to left visual field during a functional task or conversation with min clinician cues.  SLP Short Term Goal 2 (Week 4): Patient will alternate attention between tasks for 5 minutes with min clinician cues.  SLP Short Term Goal 3 (Week 4): Patient will demonstrate emergent awareness of deficits by requesting help as needed with min assist questions cues.  SLP Short Term Goal 4 (Week 4): Patient will self-monitor and correct errors with functional tasks with min question cues.  SLP Short Term Goal 5 (Week 4): Patient wil utilize external aids to assist with recall with min assist question cues.  Skilled Therapeutic Interventions: Skilled treatment session focused on addressing cognitive goals and family education. SLP facilitated session with external aid and min-mod assist verbal cues to verbally problem solve medications: function and frequency.  Mother present for session and asked appropriate questions regarding planning for follow-up and discharge; SLP provided with memory strategies handout that reinforced information discussed during session. Continue plan of care.   FIM:  Comprehension Comprehension Mode: Auditory Comprehension: 5-Understands complex 90% of the time/Cues < 10% of the time Expression Expression Mode: Verbal Expression: 5-Expresses complex 90% of the time/cues < 10% of the time Social Interaction Social Interaction: 4-Interacts appropriately 75 - 89% of the time - Needs redirection for appropriate language or to initiate interaction. Problem Solving Problem Solving: 4-Solves basic 75 - 89% of the time/requires cueing 10 - 24% of the time Memory Memory: 4-Recognizes or recalls 75 - 89% of the  time/requires cueing 10 - 24% of the time  Pain Pain Assessment Pain Assessment: No/denies pain  Therapy/Group: Individual Therapy  Charlane Ferretti., CCC-SLP [phone]  Jayshawn Colston 03/18/2013, 4:47 PM

## 2013-03-19 ENCOUNTER — Inpatient Hospital Stay (HOSPITAL_COMMUNITY): Payer: TRICARE For Life (TFL) | Admitting: *Deleted

## 2013-03-19 ENCOUNTER — Inpatient Hospital Stay (HOSPITAL_COMMUNITY): Payer: Medicare Other

## 2013-03-19 ENCOUNTER — Inpatient Hospital Stay (HOSPITAL_COMMUNITY): Payer: Medicare Other | Admitting: Occupational Therapy

## 2013-03-19 ENCOUNTER — Inpatient Hospital Stay (HOSPITAL_COMMUNITY): Payer: Medicare Other | Admitting: Speech Pathology

## 2013-03-19 NOTE — Progress Notes (Signed)
Patient ID: [name], female   DOB: Sep 03, 1976, 37 y.o.   MRN: [id number] Subjective/Complaints: No New complaints 36y.o. 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 Admitted 02/16/2013 with complaints of headache and left-sided weakness with altered mental status. INR on admission of 1.14 with noted poor compliance on Coumadin. MRI of the brain showed acute infarct right MCA territory without hemorrhage as well as scattered small areas of acute infarct left frontal and parietal lobe. Patient did not receive TPA. Echocardiogram with ejection fraction of 60% without embolism. 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 There was a filling defect involving the innominate artery thought likely to represent thrombus. There was also occlusion of the right cavernous carotid and right M1 segment indicate of embolic phenomenon. Underwent ICA thrombectomy 02/16/2013 per interventional radiology. Patient had been placed on aspirin therapy per neurology services and Coumadin later resumed of which patient adamantly refused and patient was placed thus patient was placed on Xarelto for stroke prevention 02/20/2013  No problems overnite Family training in progress  Review of Systems  L hand occ burning pain All other systems reviewed and are negative.    Objective: Vital Signs: Blood pressure 129/75, pulse 78, temperature 97.2 F (36.2 C), temperature source Oral, resp. rate 20, weight 65.5 kg (144 lb 6.4 oz), last menstrual period 02/15/2013, SpO2 98.00%. No results found. No results found for this or any previous visit (from the past 72 hour(s)).   HEENT: normal Gen:  NAD Heart regular rate and rhythm no rubs murmurs or exercise Abdomen positive bowel sounds soft nontender  palpation Extremities no clubbing cyanosis or edema Musculoskeletal no shoulder pain with range of motion left side.+ subluxation, R shoulder neg pain to palp and ROM, Left ankle without swelling or pain to palp, pulses normal Motor strength 5/5 in the right deltoid, biceps, triceps, grip, hip flexor, knee extensors, ankle dorsiflexor plantar flexor 0 in the left deltoid, biceps, triceps, grip 0. 2- minus in the hip flexor, knee extensor, ankle dorsiflexor, and toe flexor extensors Sensation is able to identify light touch in the left upper and left lower extremity as well as on the right side. 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 Functional deficits secondary to R MCA infarct which require 3+ hours per day of interdisciplinary therapy in a comprehensive inpatient rehab setting. Physiatrist is providing close team supervision and 24 hour management of active medical problems listed below. Physiatrist and rehab team continue to assess barriers to discharge/monitor patient progress toward functional and medical goals. Should be ready for D/C in am FIM: FIM - Bathing Bathing Steps Patient Completed: Chest;Right Arm;Left Arm;Abdomen;Front perineal area;Buttocks;Right upper leg;Left upper leg;Right lower leg (including foot);Left lower leg (including foot) Bathing: 5: Supervision: Safety issues/verbal cues  FIM - Upper Body Dressing/Undressing Upper body dressing/undressing steps patient completed: Thread/unthread right bra strap;Thread/unthread left bra strap;Thread/unthread right sleeve of pullover shirt/dresss;Put head through opening of pull over shirt/dress;Pull shirt over trunk Upper body dressing/undressing: 4: Min-Patient completed 75 plus % of  tasks FIM - Lower Body Dressing/Undressing Lower body dressing/undressing steps patient completed: Thread/unthread right underwear leg;Thread/unthread  left underwear leg;Pull underwear up/down;Thread/unthread right pants leg;Thread/unthread left pants leg;Pull pants up/down;Don/Doff left shoe;Don/Doff right shoe Lower body dressing/undressing: 4: Min-Patient completed 75 plus % of tasks  FIM - Toileting Toileting steps completed by patient: Performs perineal hygiene Toileting Assistive Devices: Grab bar or rail for support Toileting: 2: Max-Patient completed 1 of 3 steps  FIM - Diplomatic Services operational officer Devices: Elevated toilet seat;Grab bars Toilet Transfers: 4-From toilet/BSC: Min A (steadying Pt. > 75%);4-To toilet/BSC: Min A (steadying Pt. > 75%)  FIM - Bed/Chair Transfer Bed/Chair Transfer Assistive Devices: Arm rests Bed/Chair Transfer: 5: Supine > Sit: Supervision (verbal cues/safety issues)  FIM - Locomotion: Wheelchair Distance: 100 Locomotion: Wheelchair: 2: Travels 50 - 149 ft with supervision, cueing or coaxing FIM - Locomotion: Ambulation Locomotion: Ambulation Assistive Devices: Environmental consultant - Hemi;Orthosis Ambulation/Gait Assistance: 4: Min assist Locomotion: Ambulation: 1: Travels less than 50 ft with minimal assistance (Pt.>75%)  Comprehension Comprehension Mode: Auditory Comprehension: 5-Understands complex 90% of the time/Cues < 10% of the time  Expression Expression Mode: Verbal Expression: 5-Expresses complex 90% of the time/cues < 10% of the time  Social Interaction Social Interaction Mode: Asleep Social Interaction: 4-Interacts appropriately 75 - 89% of the time - Needs redirection for appropriate language or to initiate interaction.  Problem Solving Problem Solving: 4-Solves basic 75 - 89% of the time/requires cueing 10 - 24% of the time  Memory Memory: 4-Recognizes or recalls 75 - 89% of the time/requires cueing 10 - 24% of the  time  Medical Problem List and Plan:  1. Thromboembolic right MCA infarct left hemiparesis, combination of Takayasu's arteritis and hyper coagulable state. If pt starts ambulating more may need sling to control LUE Hold off on AFO decisions until next week given good LE recovery 2. DVT Prophylaxis/Anticoagulation: IVC filter/ Xarelto.Lupus anticoagulant antibody positive , non compliant with WarfarinMonitor for any signs of bleeding  3. Neuropsych: This patient is not capable of making decisions on his/her own behalf.  -family supportive 4. History of lupus anticoag Pos, no other clinical signs of SLE Continue prednisone for Takayasu's arteritis, CBGs look ok 5. MRSA positive nasal swab. Contact precautions and remains afebrile 6. History of medical noncompliance. Counseling  7. Microcytic anemia. Latest hemoglobin 9.4 on 02/28/2013. Followup CBC  8.  Chronic constipation add senna S- pt  inaccurate with bowel reports. Adjust bowel program as needed 9.  Leukocytosis on chronic steroid, afebrile 10.  Elevated BP this am, appears to be outlier with monitor 11.  Left hand dysesthesia, CVA related monitor , if it worsens will start gabapentin LOS (Days) 26 A FACE TO FACE EVALUATION WAS PERFORMED  KIRSTEINS,ANDREW E 03/19/2013, 7:57 AM

## 2013-03-19 NOTE — Progress Notes (Signed)
Occupational Therapy Session Note  Patient Details  Name: [name] MRN: [id number] Date of Birth: Apr 03, 1976  Today's Date: 03/19/2013 Time: 0800-0900 and 4098-1191 Time Calculation (min): 60 min and 45 min  Short Term Goals: Week 1:  OT Short Term Goal 1 (Week 1): Pt.  will maintain midline control for 5 minutes during grooming activity OT Short Term Goal 1 - Progress (Week 1): Met OT Short Term Goal 2 (Week 1): Pt. will perform grooming with mod assist OT Short Term Goal 2 - Progress (Week 1): Met OT Short Term Goal 3 (Week 1): Pt. will perform UB bathing with minimal assist and max with portural control OT Short Term Goal 3 - Progress (Week 1): Met OT Short Term Goal 4 (Week 1): Pt. will bathe LB with mod assist and max assist with sitting balance OT Short Term Goal 4 - Progress (Week 1): Met OT Short Term Goal 5 (Week 1): Pt. will dress UB with mod assist OT Short Term Goal 5 - Progress (Week 1): Met Week 2:  OT Short Term Goal 1 (Week 2): Pt will be able to transfer on and off a tub bench with mod assist. OT Short Term Goal 1 - Progress (Week 2): Met OT Short Term Goal 2 (Week 2): Pt will be able to maintain sitting balance on tub bench with min assist to demonstrate improved trunk control. OT Short Term Goal 2 - Progress (Week 2): Met OT Short Term Goal 3 (Week 2): Pt will demonstrate improved left side awareness by finding items on her left side with minimal cuing. OT Short Term Goal 3 - Progress (Week 2): Met OT Short Term Goal 4 (Week 2): Pt will transfer on and off toilet with min assist. OT Short Term Goal 4 - Progress (Week 2): Met OT Short Term Goal 5 (Week 2): Pt will be able to stand statically with min assist with RUE support to allow caregivers to assist her with clothing management. OT Short Term Goal 5 - Progress (Week 2): Met Week 3:  OT Short Term Goal 1 (Week 3): Pt will ambulate in/out of bathroom to shower and toilet with steady assist with hemiwalker. OT  Short Term Goal 2 (Week 3): Pt will dress UB with min assist only to fasten bra. OT Short Term Goal 3 (Week 3): Pt will don underwear and pants with steady assist. OT Short Term Goal 4 (Week 3): Pt will toilet with steady assist. OT Short Term Goal 4 - Progress (Week 3): Met OT Short Term Goal 5 (Week 3): Pt will be able to perform self ROM with min assist.    Skilled Therapeutic Interventions/Progress Updates:    Visit 1: No c/o pain. Pt seen for BADL retraining in ADL apartment to bathe with tub bench in tub. Pt's mother present and focus of session was on family education.  They worked on  transfers using hemi cane from w/c to tub bench taking a few steps with min assist.  Her mother was having difficulty stabilizing her at her arm without pulling on the axilla area, so cued her to provide stabilization through her hips.  Pt needed cuing to get through the bath efficiently as she tended to perseverating on the same body part.  Her mother did well cuing her through the steps. Pt returned to her room to complete oral care prior to her next PT session.  Visit 2: Pt seen this session for neuro re-ed to LUE with NMES for 10 min  to wrist extensors with hand over hand guiding for grasping and releasing activities.  Pt continues to have no active movement after estim is completed. LUE weight bearing on mat with dynamic reaching with RUE to left.  Bed mobility followed by SROM from supine and then from sitting to work on her HEP.  Pt returned to her room in her w/c independently. Pt will be discharged to home tomorrow.  Therapy Documentation Precautions:  Precautions Precautions: Fall Precaution Comments: delayed processing; impulsive, left inattention, left hemi peresis Restrictions Weight Bearing Restrictions: No   ADL: See FIM for current functional status  Therapy/Group: Individual Therapy  Westlee Devita 03/19/2013, 9:53 AM

## 2013-03-19 NOTE — Patient Care Conference (Signed)
Inpatient RehabilitationTeam Conference and Plan of Care Update Date: 03/18/2013   Time: 10:45 AM    Patient Name: [name]      Medical Record Number: [id number]  Date of Birth: [birth year] Sex: Female         Room/Bed: 4144/4144-01 Payor Info: Payor: MEDICARE  Plan: MEDICARE PART A AND B  Product Type: *No Product type*     Admitting Diagnosis: rt cva  Admit Date/Time:  02/21/2013  8:03 PM Admission Comments: No comment available   Primary Diagnosis:  Embolic infarction Principal Problem: Embolic infarction  Patient Active Problem List   Diagnosis Date Noted  . 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 Postprocedural respiratory failure 02/16/2013  . Essential hypertension, malignant 02/16/2013  . Sinus tachycardia 02/16/2013  . CVA (cerebral infarction) 12/05/2012  . Subtherapeutic international normalized ratio (INR) 12/05/2012  . ANEMIA, IRON DEFICIENCY 09/04/2006  . DEPRESSION 09/04/2006  . 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 THROMBOSIS, VENOUS 09/04/2006  . CONSTIPATION 09/04/2006  . OVARIAN CYST 09/04/2006  . FOOT DROP, LEFT 09/04/2006    Expected Discharge Date: Expected Discharge Date: 03/20/13  Team Members Present: Physician leading conference: Dr. Claudette Laws Social Worker Present: Amada Jupiter, LCSW Nurse Present: Gregor Hams, RN PT Present: Wanda Plump, PT Clarisse Gouge Ripa, PT) OT Present: Leonette Monarch, OT [facility] Perkinson, OT) SLP Present: Fae Pippin, SLP Other (Discipline and Name): Tora Duck, PPS     Current Status/Progress Goal Weekly Team Focus  Medical   improving LLE strenght  AFO fitting, safe mobility in household  Training mother   Bowel/Bladder   continent of bowel and bladder  continent with toileting      Swallow/Nutrition/ Hydration   regular textures and thin liquids  least restrictive po intake      ADL's   close supervision to steady assist with bathing, supervision to don shirt, min with toileting and LB dressing. Most  goals have been met.  Working on Careers information officer learned with less cuing.  min assist overall  ADL retraining, LUE neuro re-ed, NMES, functional mobility training, pt/family education   Mobility   supervision> min assist bed; supervision transfers after w/c set-up; gait with min> mod assist 50-80'  min assist transfers and short distance ambulation  NM re-ed; strengthening, functional mobility, family and pt ed   Communication   supervision  min assist  family education    Safety/Cognition/ Behavioral Observations  no unsafe behavior, bed alarm, side rrails up x3, quick release belt in wheelchair, Aspen collar at all times, TLSO brace on when OOB  no falls with injury  increase awareness, self-monitor and correct; family education    Pain   scheduled tylenol QID  3 or less on scale of 1-10      Skin   blanchable redness to inner buttocks, barrier cream in use, bilateral heels reddish purple in color, blanchable, float, prevalon boots in use  free of breakdown         *See Care Plan and progress notes for long and short-term goals.  Barriers to Discharge: multiple care needs    Possible Resolutions to Barriers:  coordinate in home as well as HH caregivers    Discharge Planning/Teaching Needs:  Home with mother to provide 24/7 care      Team Discussion:  Completing family education with mother this week.  Anticipate meeting all fam ed goals.  W/c seating today.  Ready for d/c end of week.  Revisions to Treatment Plan:  None   Continued Need for Acute  Rehabilitation Level of Care: The patient requires daily medical management by a physician with specialized training in physical medicine and rehabilitation for the following conditions: Daily direction of a multidisciplinary physical rehabilitation program to ensure safe treatment while eliciting the highest outcome that is of practical value to the patient.: Yes Daily medical management of patient stability for increased activity  during participation in an intensive rehabilitation regime.: Yes Daily analysis of laboratory values and/or radiology reports with any subsequent need for medication adjustment of medical intervention for : Neurological problems;Other  Wessie Shanks 03/19/2013, 11:21 AM

## 2013-03-19 NOTE — Progress Notes (Signed)
Occupational Therapy Discharge Summary  Patient Details  Name: [name] MRN: [id number] Date of Birth: 02/10/1976  Today's Date: 03/19/2013    Patient has met 11 of 12 long term goals due to improved activity tolerance, improved balance, postural control, ability to compensate for deficits, improved attention and improved awareness.  Patient to discharge at [facility] Assist level.  Patient's care partner is independent to provide the necessary physical and cognitive assistance at discharge.    Reasons goals not met: Pt had a goal for supervision with UB dressing, but due to perceptual and memory deficits she continues to need min assist to start left arm in sleeve and fasten bra.  Recommendation:  Patient will benefit from ongoing skilled OT services in outpatient setting to continue to advance functional skills in the area of BADL.  Equipment: BSC, transfer tub bench  Reasons for discharge: treatment goals met  Patient/family agrees with progress made and goals achieved: Yes  OT Discharge Precautions/Restrictions  Precautions Precautions: Fall Precaution Comments: delayed processing; impulsive, left inattention, left hemi peresis   ADL Refer to FIM - overall min assist  Vision/Perception  Vision - History Baseline Vision: No visual deficits Vision - Assessment Eye Alignment: Within Functional Limits Vision Assessment: Vision tested Additional Comments: [facility] Perception Perception: Impaired Inattention/Neglect: Does not attend to left visual field;Other (comment) (minimally impaired - has improved a great deal from admissio) Spatial Orientation: minimally impaired with positioning herself for transfers Praxis Praxis: Impaired Praxis Impairment Details: Motor planning Praxis-Other Comments: minimally impaired dressing tasks only  Cognition Overall Cognitive Status: Impaired/Different from baseline Memory: Impaired Memory Impairment: Storage deficit;Retrieval  deficit Awareness: Impaired Awareness Impairment: Intellectual impairment Problem Solving: Impaired Problem Solving Impairment: Functional basic Initiating: Appears intact Sensation Sensation Light Touch: Appears Intact Stereognosis: Impaired by gross assessment Hot/Cold: Appears Intact Proprioception: Impaired by gross assessment Coordination Gross Motor Movements are Fluid and Coordinated: No (left hemiparesis) Fine Motor Movements are Fluid and Coordinated: No Motor  Motor Motor: Hemiplegia Motor - Discharge Observations: improved protective or righting response, pt able to catch herself if she loses balance in sitting Mobility    Refer to FIM - overall min assist with stand pivot transfers and ambulating a few steps with hemi walker Trunk/Postural Assessment  Cervical Assessment Cervical Assessment: Within Functional Limits Thoracic Assessment Thoracic Assessment: Within Functional Limits Lumbar Assessment Lumbar Assessment: Within Functional Limits Postural Control Righting Reactions: supervision in sitting Protective Responses: supervision in sitting Postural Limitations: supervision to sit in midline at EOB  Balance Static Sitting Balance Static Sitting - Level of Assistance: 5: Stand by assistance Dynamic Sitting Balance Dynamic Sitting - Level of Assistance: 5: Stand by assistance Static Standing Balance Static Standing - Level of Assistance: 4: Min assist Dynamic Standing Balance Dynamic Standing - Level of Assistance: 3: Mod assist (with reaching to ankles for pants) Extremity/Trunk Assessment RUE Assessment RUE Assessment: Within Functional Limits LUE PROM (degrees) Overall PROM Left Upper Extremity: Within functional limits for tasks assessed LUE Tone LUE Tone: Flaccid LUE Tone Comments: mild tone developing in left elbow flexors, subluxation  See FIM for current functional status  [name] 03/19/2013, 9:50 AM

## 2013-03-19 NOTE — Progress Notes (Signed)
Speech Language Pathology Daily Session Note & Discharge Summary  Patient Details  Name: SHANYN PREISLER MRN: [id number] Date of Birth: 1975/12/12  Today's Date: 03/19/2013 Time: 1003-1030 Time Calculation (min): 27 min  Short Term Goals: Week 4: SLP Short Term Goal 1 (Week 4): Patient will attend to left visual field during a functional task or conversation with min clinician cues.  SLP Short Term Goal 2 (Week 4): Patient will alternate attention between tasks for 5 minutes with min clinician cues.  SLP Short Term Goal 3 (Week 4): Patient will demonstrate emergent awareness of deficits by requesting help as needed with min assist questions cues.  SLP Short Term Goal 4 (Week 4): Patient will self-monitor and correct errors with functional tasks with min question cues.  SLP Short Term Goal 5 (Week 4): Patient wil utilize external aids to assist with recall with min assist question cues.  Skilled Therapeutic Interventions: Skilled treatment session focused on addressing cognitive goals and family education. SLP facilitated session with handout and discussion regarding home carryover for optimal cognitive function.  SLP provided min-mod assist verbal cues to verbally problem solve solution to problems that might arise after discharge.  Specifically SLP educated on need for patient to allow caregivers to direct tasks at times since they may have a more objective perspective on the issue at hand (i.e. patient thinks she can walk to the car and mother thinks she should be in her wheelchair and transfer to it).    FIM:  Comprehension Comprehension Mode: Auditory Comprehension: 5-Understands complex 90% of the time/Cues < 10% of the time Expression Expression Mode: Verbal Expression: 5-Expresses complex 90% of the time/cues < 10% of the time Social Interaction Social Interaction: 4-Interacts appropriately 75 - 89% of the time - Needs redirection for appropriate language or to initiate  interaction. Problem Solving Problem Solving: 4-Solves basic 75 - 89% of the time/requires cueing 10 - 24% of the time Memory Memory: 4-Recognizes or recalls 75 - 89% of the time/requires cueing 10 - 24% of the time FIM - Eating Eating Activity: 5: Set-up assist for open containers  Pain Pain Assessment Pain Assessment: No/denies pain  Therapy/Group: Individual Therapy   Speech Language Pathology Discharge Summary  Patient Details  Name: RAKSHA WOLFGANG MRN: [id number] Date of Birth: 09-Jul-1976  Today's Date: 03/19/2013  Patient has met 8 of 8 long term goals.  Patient to discharge at [facility] level.  Reasons goals not met: n/a   Clinical Impression/Discharge Summary: Patient met 8 out of 8 long term goals this reporting period due to gains in overall swallow function, cognition and initiation with expression of wants and needs.  Patient progressed from max assist to min assist level during CIR stay.  Patient's greatest gains were in the areas of attention, awareness, problem solving with self-monitoring and correcting.  Patient continues to require min assist to complete tasks due to attentional deficits as well as to utilize intonation during spontaneous speech.  Family education has been completed and patient is ready for discharge with 24/7 supervision.  It is recommended that patient continue to receive skilled SLP services at the next level of care to continue to address safety awareness, while maximizing functional independence and further reducing burden of care.    Care Partner:  Caregiver Able to Provide Assistance: Yes  Type of Caregiver Assistance: Physical;Cognitive  Recommendation:  Home Health SLP;Outpatient SLP;24 hour supervision/assistance  Rationale for SLP Follow Up: Maximize functional communication;Maximize cognitive function and independence;Reduce caregiver burden  Equipment: none   Reasons for discharge: Treatment goals met;Discharged from hospital    Patient/Family Agrees with Progress Made and Goals Achieved: Yes   See FIM for current functional status  Charlane Ferretti., CCC-SLP [phone]  Hari Casaus 03/19/2013, 1:02 PM

## 2013-03-19 NOTE — Discharge Summary (Signed)
NAMEHARPREET, [name]                  ACCOUNT NO.:  [id number]  MEDICAL RECORD NO.:  [id number]  LOCATION:  4144                         FACILITY:  [facility]  PHYSICIAN:  Erick Colace, M.D.DATE OF BIRTH:  [birth year]  DATE OF ADMISSION:  02/21/2013 DATE OF DISCHARGE:  03/20/2013                              DISCHARGE SUMMARY   DISCHARGE DIAGNOSES: 1. Thromboembolic right MCA infarct.  IVC filter for DVT prophylaxis. 2. Question history of lupus, MRSA positive nasal swab with contact     precautions. 3. Medical noncompliance. 4. Microcytic anemia.  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  Admitted on 02/16/2013, with complaints of headache, left-sided weakness, and altered mental status.  INR on admission of 1.14 with noted poor medical compliance, on Coumadin.  MRI of the brain showed acute infarct right PCA territory without hemorrhages as well as scattered small areas of acute infarct left frontal and parietal lobe.  The patient did not receive tPA.  Echocardiogram with ejection fraction of 60% without embolism.  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  There was a filling defect involving the innominate artery thought likely to represent thrombus. There was also occlusion of the right cavernous carotid right M1 segment indicating embolic phenomenon.  Underwent ICA thrombectomy on 02/16/2013, per interventional radiology.  The patient had been placed on aspirin therapy per Neurology services and Coumadin later resumed which the patient adamantly refused and was placed on Xarelto for stroke prophylaxis on 02/20/2013.  Physical and occupational therapy on-going. The patient was admitted for  comprehensive rehab program.  PAST MEDICAL HISTORY:  See discharge diagnoses.  SOCIAL HISTORY:  Unclear.  The patient stated to be living alone.  Has an 70 year old son.  FUNCTIONAL HISTORY:  The patient had stated needed no assist prior to admission.  Functional status upon admission to rehab services was +2 total assist for stand pivot transfers.  PHYSICAL EXAMINATION:  VITAL SIGNS:  Blood pressure 121/73, pulse 65, temperature 98.2 respirations 18. GENERAL:  This was an alert female, needed some cues for initiation, she followed inconsistent commands, poor insight and awareness of her deficits with some left inattention. HEENT:  Pupils are reactive to light. LUNGS:  Clear to auscultation. CARDIAC:  Rate controlled. ABDOMEN:  Soft, nontender.  Good bowel sounds.  REHABILITATION HOSPITAL COURSE:  The patient was admitted to inpatient rehab services with therapies initiated on a 3-hour daily basis consisting of physical therapy, occupational therapy, speech therapy, and rehabilitation nursing.  The following issues were addressed during the patient's rehabilitation stay.  Pertaining to Ms. Smoots's thromboembolic right MCA infarct, it remained stable.  She would follow up Neurology services.  She remained on Xarelto for stroke prophylaxis as well as IVC filter for history of DVT.  She had refused Coumadin therapy.  Noted documentation of possible lupus.  She remained on prednisone.  She was denying this diagnosis.  She continued on contact precautions for  MRSA positive nasal swab with remaining afebrile.  She did have a documented history of medical noncompliance.  Received full counseling in regard to maintaining her medical regimen.  Microcytic anemia stable with latest hemoglobin of 10.7 and monitored.  The patient received weekly collaborative interdisciplinary team conferences to discuss estimated length of stay, family teaching, and any barriers to discharge.  She was  continent of bowel and bladder.  Her diet had been advanced to a regular consistency.  She was minimum-to-moderate assist for bathing and upper body dressing, max assist with toileting and lower body dressing, minimum-to-mod assist with stand pivot transfers. Improving trunk control in sitting balance, minimal assist with bed mobility, minimum-to-moderate assist with transfers, max assist ambulation with a railing in the hallway.  Able to progress left lower extremity with better knee control in stance.  Ritalin had been added to her regimen due to some inattention and neglect to help focus better on tasks as well as organizing in sequencing with problem solving.  Her ultimate plans will be discharged to home with her mom with 24-hour care with full family teaching completed.  DISCHARGE MEDICATIONS:  At the time of dictation included: 1. Ritalin 5 mg p.o. b.i.d. 2. Multivitamin daily. 3. Prednisone 20 mg p.o. daily. 4. Xarelto 20 mg p.o. daily. 5. Senokot-S tablets two at bedtime hold for loose stools. 6. Restoril 50 mg at bedtime p.r.n. sleep. 7. Ultram 50 mg p.o. every 6 hours as needed pain.  DIET:  Regular.  SPECIAL INSTRUCTION:  She would follow up Dr. Claudette Laws at the outpatient rehab center on 04/10/2013, Dr. Delia Heady, Neurology services in 1 month, call for appointment.  Dr. Veronda Prude, Interventional Radiology, call for appointment.  Ongoing therapies had been arranged as per Rehab services.     Mariam Dollar, P.A.   ______________________________ Erick Colace, M.D.    DA/MEDQ  D:  03/19/2013  T:  03/19/2013  Job:  [id number]  cc:   Pramod P. Pearlean Brownie, MD Marva Panda, NP

## 2013-03-19 NOTE — Discharge Summary (Signed)
  Discharge summary job [id number]

## 2013-03-19 NOTE — Progress Notes (Signed)
Social Work Patient ID: [name], female   DOB: 1976-05-07, 37 y.o.   MRN: [id number]   Met yesterday afternoon with patient and mother to review team conference and d/c planning underway.  Both report feeling that the education is going well for them and that they will be ready for d/c 4/25.  Per pt/ mother preference, will start with HH therapies with hopes to transition to OP within a few weeks.  No concerns noted.  Will continue to follow.  Landy Dunnavant, LCSW

## 2013-03-19 NOTE — Progress Notes (Signed)
Social Work  Discharge Note  The overall goal for the admission was met for:   Discharge location: Yes - home with mother to provide 24/7 assist  Length of Stay: Yes - 27 days  Discharge activity level: Yes - minimal assist overall  Home/community participation: Yes  Services provided included: MD, RD, PT, OT, SLP, RN, TR, Pharmacy, Neuropsych and SW  Financial Services: Medicare  Follow-up services arranged: Home Health: PT, OT, ST via [facility], DME: w/c and cushion via [employer]; hemi-walker, 3n1, tub bench via [facility] and Patient/Family has no preference for HH/DME agencies  Comments (or additional information):  Patient/Family verbalized understanding of follow-up arrangements: Yes  Individual responsible for coordination of the follow-up plan: patient and mother  Confirmed correct DME delivered: Amada Jupiter 03/19/2013    Kareen Jefferys

## 2013-03-19 NOTE — Progress Notes (Signed)
Physical Therapy Session Note  Patient Details  Name: [name] MRN: [id number] Date of Birth: [birth year]  Today's Date: 03/19/2013 Time: 0905-1004 Time Calculation (min): 59 min  Short Term Goals: Week 4: LTGs    Skilled Therapeutic Interventions/Progress Updates: focus of treatment was family ed with mother for simulated car transfer, gait on level tile and up/down 4 steps with 1 rail L ascending.  Pt will be discharging to her mother's home which has 4 STE, L rail into 1 level home.  Mat/bed mobility with supervision for safety, occasional VC for best technique.  Pt and mother practice w/c>< simulated car transfers x 3; pt requiring many cues for safety, sequencing, techniques, etc.  Pt requested ambulating with HW to car; mother and pt performed car transfer in this way x 2, safely, min assist.  Gait on level tile and up/down 4 steps L rail with mother providing appropriate assistance and cueing.  With therapist, pt performed gait in hallway with HW x 50' with supervision, with safe speed, step lengths, use of HW.  Pt will be receiving loaner w/c from [employer] this PM; PT will be scheduled for AM tomorrow to train pt's mother in folding w/c, loading into car, etc.     Therapy Documentation Precautions:  Precautions Precautions: Fall Precaution Comments: delayed processing; impulsive, left inattention, left hemi peresis Restrictions Weight Bearing Restrictions: No Pain: Pain Assessment Pain Assessment: No/denies pain   Locomotion : Ambulation Ambulation/Gait Assistance: 5: Supervision Wheelchair Mobility Distance: 150     See FIM for current functional status  Therapy/Group: Individual Therapy  Khamryn Calderone 03/19/2013, 2:41 PM

## 2013-03-20 ENCOUNTER — Inpatient Hospital Stay (HOSPITAL_COMMUNITY): Payer: Medicare Other

## 2013-03-20 MED ORDER — TRAMADOL HCL 50 MG PO TABS: 50.0000 mg | ORAL_TABLET | Freq: Four times a day (QID) | ORAL | Status: DC | PRN

## 2013-03-20 MED ORDER — METHYLPHENIDATE HCL 5 MG PO TABS: 5.0000 mg | ORAL_TABLET | Freq: Two times a day (BID) | ORAL | Status: DC

## 2013-03-20 MED ORDER — ADULT MULTIVITAMIN W/MINERALS CH: 1.0000 | ORAL_TABLET | Freq: Every day | ORAL | Status: DC

## 2013-03-20 MED ORDER — RIVAROXABAN 20 MG PO TABS: 20.0000 mg | ORAL_TABLET | Freq: Every day | ORAL | Status: DC

## 2013-03-20 MED ORDER — ZOLPIDEM TARTRATE 10 MG PO TABS: 10.0000 mg | ORAL_TABLET | Freq: Every evening | ORAL | Status: DC | PRN

## 2013-03-20 NOTE — Progress Notes (Signed)
Patient ID: [name], female   DOB: 1976-10-01, 37 y.o.   MRN: [id number] Subjective/Complaints: No New complaints 36y.o. 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 Admitted 02/16/2013 with complaints of headache and left-sided weakness with altered mental status. INR on admission of 1.14 with noted poor compliance on Coumadin. MRI of the brain showed acute infarct right MCA territory without hemorrhage as well as scattered small areas of acute infarct left frontal and parietal lobe. Patient did not receive TPA. Echocardiogram with ejection fraction of 60% without embolism. 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 There was a filling defect involving the innominate artery thought likely to represent thrombus. There was also occlusion of the right cavernous carotid and right M1 segment indicate of embolic phenomenon. Underwent ICA thrombectomy 02/16/2013 per interventional radiology. Patient had been placed on aspirin therapy per neurology services and Coumadin later resumed of which patient adamantly refused and patient was placed thus patient was placed on Xarelto for stroke prevention 02/20/2013  No problems overnite Family training in progress  Review of Systems  L hand occ burning pain All other systems reviewed and are negative.    Objective: Vital Signs: Blood pressure 127/69, pulse 70, temperature 97.1 F (36.2 C), temperature source Oral, resp. rate 18, weight 65.5 kg (144 lb 6.4 oz), last menstrual period 02/15/2013, SpO2 99.00%. No results found. No results found for this or any previous visit (from the past 72 hour(s)).   HEENT: normal Gen:  NAD Heart regular rate and rhythm no rubs murmurs or exercise Abdomen positive bowel sounds soft nontender  palpation Extremities no clubbing cyanosis or edema Musculoskeletal no shoulder pain with range of motion left side.+ subluxation, R shoulder neg pain to palp and ROM, Left ankle without swelling or pain to palp, pulses normal Motor strength 5/5 in the right deltoid, biceps, triceps, grip, hip flexor, knee extensors, ankle dorsiflexor plantar flexor 0 in the left deltoid, biceps, triceps, grip 0. 2- minus in the hip flexor, knee extensor, ankle dorsiflexor, and toe flexor extensors Sensation is able to identify light touch in the left upper and left lower extremity as well as on the right side. 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 Functional deficits secondary to R MCA infarct Stable for D/C today F/u PCP in 1-2 weeks F/u PM&R 3 weeks See D/C summary See D/C instructions  FIM: FIM - Bathing Bathing Steps Patient Completed: Chest;Right Arm;Left Arm;Abdomen;Front perineal area;Buttocks;Right upper leg;Left upper leg;Right lower leg (including foot);Left lower leg (including foot) Bathing: 5: Supervision: Safety issues/verbal cues  FIM - Upper Body Dressing/Undressing Upper body dressing/undressing steps patient completed: Thread/unthread right bra strap;Thread/unthread left bra strap;Thread/unthread right sleeve of pullover shirt/dresss;Put head through opening of pull over shirt/dress;Pull shirt over trunk Upper body dressing/undressing: 4: Min-Patient completed 75 plus % of tasks FIM - Lower Body Dressing/Undressing Lower body dressing/undressing steps patient completed: Thread/unthread right underwear leg;Thread/unthread left underwear leg;Pull underwear up/down;Thread/unthread right pants leg;Thread/unthread left pants leg;Pull pants  up/down;Don/Doff left shoe;Don/Doff right shoe (elastic shoe laces) Lower body dressing/undressing: 4: Min-Patient completed 75 plus % of tasks  FIM - Toileting  Toileting steps completed by patient: Adjust clothing prior to toileting;Performs perineal hygiene;Adjust clothing after toileting Toileting Assistive Devices: Grab bar or rail for support Toileting: 4: Steadying assist  FIM - Diplomatic Services operational officer Devices: Cane;Elevated toilet seat Toilet Transfers: 4-From toilet/BSC: Min A (steadying Pt. > 75%);4-To toilet/BSC: Min A (steadying Pt. > 75%)  FIM - Bed/Chair Transfer Bed/Chair Transfer Assistive Devices: Arm rests Bed/Chair Transfer: 5: Supine > Sit: Supervision (verbal cues/safety issues);5: Sit > Supine: Supervision (verbal cues/safety issues);5: Chair or W/C > Bed: Supervision (verbal cues/safety issues);5: Bed > Chair or W/C: Supervision (verbal cues/safety issues)  FIM - Locomotion: Wheelchair Distance: 200 Locomotion: Wheelchair: 5: Travels 150 ft or more: maneuvers on rugs and over door sills with supervision, cueing or coaxing FIM - Locomotion: Ambulation Locomotion: Ambulation Assistive Devices: Environmental consultant - Hemi;Orthosis Ambulation/Gait Assistance: 5: Supervision Locomotion: Ambulation: 2: Travels 50 - 149 ft with supervision/safety issues  Comprehension Comprehension Mode: Auditory Comprehension: 5-Understands complex 90% of the time/Cues < 10% of the time  Expression Expression Mode: Verbal Expression: 5-Expresses complex 90% of the time/cues < 10% of the time  Social Interaction Social Interaction Mode: Asleep Social Interaction: 7-Interacts appropriately with others - No medications needed.  Problem Solving Problem Solving: 4-Solves basic 75 - 89% of the time/requires cueing 10 - 24% of the time  Memory Memory: 4-Recognizes or recalls 75 - 89% of the time/requires cueing 10 - 24% of the time  Medical Problem List and Plan:  1.  Thromboembolic right MCA infarct left hemiparesis, combination of Takayasu's arteritis and hyper coagulable state. If pt starts ambulating more may need sling to control LUE Hold off on AFO decisions until next week given good LE recovery 2. DVT Prophylaxis/Anticoagulation: IVC filter/ Xarelto.Lupus anticoagulant antibody positive , non compliant with WarfarinMonitor for any signs of bleeding  3. Neuropsych: This patient is not capable of making decisions on his/her own behalf.  -family supportive 4. History of lupus anticoag Pos, no other clinical signs of SLE Continue prednisone for Takayasu's arteritis, CBGs look ok 5. MRSA positive nasal swab. Contact precautions and remains afebrile 6. History of medical noncompliance. Counseling  7. Microcytic anemia. Latest hemoglobin 9.4 on 02/28/2013. Followup CBC  8.  Chronic constipation add senna S- pt  inaccurate with bowel reports. Adjust bowel program as needed 9.  Leukocytosis on chronic steroid, afebrile 10.  Elevated BP this am, appears to be outlier with monitor 11.  Left hand dysesthesia, CVA related monitor , if it worsens will start gabapentin LOS (Days) 27 A FACE TO FACE EVALUATION WAS PERFORMED  KIRSTEINS,ANDREW E 03/20/2013, 7:36 AM

## 2013-03-20 NOTE — Progress Notes (Signed)
Physical Therapy Discharge Summary  Patient Details  Name: [name] MRN: [id number] Date of Birth: 12/10/1975  Today's Date: 03/20/2013 Time: 5621-3086 Time Calculation (min): 45 min  Treatment session focused on family education. Patient performed bed mobility with supervision. Patient transferred bed to wheelchair with supervision from mom. Patient performed car transfer with hemiwalker and min assist from mom. Patient ambulated up and down a flight of stairs with 1 rail and mom's min assist. Patient performed floor to mat transfer with min assist from mom. Discussed safety and need for supervision to occasional assist with mobility at home. Mom and patient verbalized understanding. Both reported having no further questions.  Patient has met 8 of 8 long term goals due to improved activity tolerance, improved balance, improved postural control, increased strength, functional use of  left lower extremity, improved attention and improved awareness.  Patient to discharge at a wheelchair level Supervision to occasional min steady assist. Patient is ambulating short distances with mom's assist and use of hemiwalker.   Patient's mother is independent to provide the necessary physical and cognitive assistance at discharge.  Reasons goals not met: n/a patient met all goals  Recommendation:  Patient will benefit from ongoing skilled PT services in home health setting to continue to advance safe functional mobility, address ongoing impairments in left inattention, poor awareness, left hemiparesis, dependency with standing balance, gait and stairs, and minimize fall risk.  Equipment: wheelchair, cushion, hemiwalker  Reasons for discharge: treatment goals met and discharge from hospital  Patient/family agrees with progress made and goals achieved: Yes  PT Discharge Precautions/Restrictions Precautions Precautions: Fall Precaution Comments: impulsive, left inattention, left  hemiparesis Pain Pain Assessment Pain Assessment: No/denies pain Cognition Overall Cognitive Status: Impaired/Different from baseline Arousal/Alertness: Awake/alert Orientation Level: Oriented X4 Attention: Sustained Focused Attention: Appears intact Problem Solving: Impaired Decision Making: Impaired Self Monitoring: Impaired Self Correcting: Impaired Behaviors: Impulsive;Perseveration Safety/Judgment: Impaired Motor  Motor Motor: Hemiplegia  Mobility Bed Mobility Rolling Right: 5: Supervision Rolling Left: 5: Supervision Supine to Sit: 5: Supervision Sitting - Scoot to Edge of Bed: 5: Supervision Sit to Supine: 5: Supervision Transfers Sit to Stand: 5: Supervision Stand Pivot Transfers: 5: Supervision  Extremity Assessment   Right UE and LE - WFL  Left UE - flaccid Left LE - grossly 3-/5 throughout      See FIM for current functional status  [name] 03/20/2013, 2:34 PM

## 2013-03-20 NOTE — Progress Notes (Signed)
Patient discharge to home with her mother at 83.  Discharge instruction provided by Harvel Ricks, PA.  Patient and mom verbalize understanding with no further questions ask.  Equipments arrived and patient ready to go.  Patient escorted off unit by IR nurse tech.

## 2013-03-23 NOTE — Progress Notes (Signed)
Recreational Therapy Discharge Summary Patient Details  Name: CARMELLE BAMBERG MRN: [id number] Date of Birth: February 08, 1976 Today's Date: 03/23/2013  Long term goals set: 1  Long term goals met: 1  Comments on progress toward goals: Pt with limited participation in TR task during admission.  Pt did however, meet supervision level for simple TR tasks seated, but requires mod cues for task completion.  Pt continues to be limited by decreased awareness of deficits, decreased attention to task, & decreased problem solving.  Also discussed & educated pt on importance of leisure in her life and potential activities for participation post discharge including community pursuits.   Pt stated understanding.  Reasons for discharge: discharge from hospital  Patient/family agrees with progress made and goals achieved: Yes  Lonisha Bobby 03/23/2013, 8:20 AM

## 2013-03-26 ENCOUNTER — Telehealth: Payer: Self-pay | Admitting: *Deleted

## 2013-03-26 ENCOUNTER — Inpatient Hospital Stay (HOSPITAL_COMMUNITY)
Admission: EM | Admit: 2013-03-26 | Discharge: 2013-03-29 | DRG: 125 | Disposition: A | Discharge status: Home Health | Payer: Medicare Other | Attending: Internal Medicine | Admitting: Internal Medicine

## 2013-03-26 ENCOUNTER — Emergency Department (HOSPITAL_COMMUNITY): Payer: Medicare Other

## 2013-03-26 DIAGNOSIS — IMO0002 Reserved for concepts with insufficient information to code with codable children: Secondary | ICD-10-CM

## 2013-03-26 DIAGNOSIS — D649 Anemia, unspecified: Secondary | ICD-10-CM | POA: Diagnosis present

## 2013-03-26 DIAGNOSIS — Z86718 Personal history of other venous thrombosis and embolism: Secondary | ICD-10-CM

## 2013-03-26 DIAGNOSIS — H5462 Unqualified visual loss, left eye, normal vision right eye: Secondary | ICD-10-CM

## 2013-03-26 DIAGNOSIS — G453 Amaurosis fugax: Secondary | ICD-10-CM

## 2013-03-26 DIAGNOSIS — I69959 Hemiplegia and hemiparesis following unspecified cerebrovascular disease affecting unspecified side: Secondary | ICD-10-CM

## 2013-03-26 DIAGNOSIS — N83209 Unspecified ovarian cyst, unspecified side: Secondary | ICD-10-CM

## 2013-03-26 DIAGNOSIS — R000 Tachycardia, unspecified: Secondary | ICD-10-CM

## 2013-03-26 DIAGNOSIS — Z7901 Long term (current) use of anticoagulants: Secondary | ICD-10-CM

## 2013-03-26 DIAGNOSIS — Z8614 Personal history of Methicillin resistant Staphylococcus aureus infection: Secondary | ICD-10-CM

## 2013-03-26 DIAGNOSIS — I749 Embolism and thrombosis of unspecified artery: Secondary | ICD-10-CM

## 2013-03-26 DIAGNOSIS — I498 Other specified cardiac arrhythmias: Secondary | ICD-10-CM | POA: Diagnosis present

## 2013-03-26 DIAGNOSIS — Z9119 Patient's noncompliance with other medical treatment and regimen: Secondary | ICD-10-CM

## 2013-03-26 DIAGNOSIS — I10 Essential (primary) hypertension: Secondary | ICD-10-CM

## 2013-03-26 DIAGNOSIS — I6529 Occlusion and stenosis of unspecified carotid artery: Secondary | ICD-10-CM | POA: Diagnosis present

## 2013-03-26 DIAGNOSIS — Z8673 Personal history of transient ischemic attack (TIA), and cerebral infarction without residual deficits: Secondary | ICD-10-CM | POA: Diagnosis present

## 2013-03-26 DIAGNOSIS — J95821 Acute postprocedural respiratory failure: Secondary | ICD-10-CM

## 2013-03-26 DIAGNOSIS — Z91199 Patient's noncompliance with other medical treatment and regimen due to unspecified reason: Secondary | ICD-10-CM

## 2013-03-26 DIAGNOSIS — F329 Major depressive disorder, single episode, unspecified: Secondary | ICD-10-CM

## 2013-03-26 DIAGNOSIS — M314 Aortic arch syndrome [Takayasu]: Secondary | ICD-10-CM

## 2013-03-26 DIAGNOSIS — M216X9 Other acquired deformities of unspecified foot: Secondary | ICD-10-CM

## 2013-03-26 DIAGNOSIS — F3289 Other specified depressive episodes: Secondary | ICD-10-CM

## 2013-03-26 DIAGNOSIS — D509 Iron deficiency anemia, unspecified: Secondary | ICD-10-CM

## 2013-03-26 DIAGNOSIS — H5460 Unqualified visual loss, one eye, unspecified: Principal | ICD-10-CM | POA: Diagnosis present

## 2013-03-26 DIAGNOSIS — R894 Abnormal immunological findings in specimens from other organs, systems and tissues: Secondary | ICD-10-CM | POA: Diagnosis present

## 2013-03-26 DIAGNOSIS — I639 Cerebral infarction, unspecified: Secondary | ICD-10-CM

## 2013-03-26 DIAGNOSIS — Z79899 Other long term (current) drug therapy: Secondary | ICD-10-CM

## 2013-03-26 DIAGNOSIS — H3400 Transient retinal artery occlusion, unspecified eye: Secondary | ICD-10-CM | POA: Diagnosis present

## 2013-03-26 DIAGNOSIS — K5900 Constipation, unspecified: Secondary | ICD-10-CM

## 2013-03-26 DIAGNOSIS — R791 Abnormal coagulation profile: Secondary | ICD-10-CM

## 2013-03-26 DIAGNOSIS — I6350 Cerebral infarction due to unspecified occlusion or stenosis of unspecified cerebral artery: Secondary | ICD-10-CM

## 2013-03-26 LAB — DIFFERENTIAL
Basophils Absolute: 0 K/uL (ref 0.0–0.1)
Basophils Relative: 0 % (ref 0–1)
Eosinophils Absolute: 0.1 K/uL (ref 0.0–0.7)
Eosinophils Relative: 1 % (ref 0–5)
Lymphocytes Relative: 19 % (ref 12–46)
Lymphs Abs: 2.2 K/uL (ref 0.7–4.0)
Monocytes Absolute: 0.8 K/uL (ref 0.1–1.0)
Monocytes Relative: 7 % (ref 3–12)
Neutro Abs: 8.7 K/uL — ABNORMAL HIGH (ref 1.7–7.7)
Neutrophils Relative %: 74 % (ref 43–77)

## 2013-03-26 LAB — COMPREHENSIVE METABOLIC PANEL WITH GFR
ALT: 13 U/L (ref 0–35)
AST: 18 U/L (ref 0–37)
Albumin: 3.2 g/dL — ABNORMAL LOW (ref 3.5–5.2)
Alkaline Phosphatase: 58 U/L (ref 39–117)
BUN: 7 mg/dL (ref 6–23)
CO2: 23 meq/L (ref 19–32)
Calcium: 9.4 mg/dL (ref 8.4–10.5)
Chloride: 100 meq/L (ref 96–112)
Creatinine, Ser: 0.81 mg/dL (ref 0.50–1.10)
GFR calc Af Amer: >90 mL/min
GFR calc non Af Amer: >90 mL/min
Glucose, Bld: 95 mg/dL (ref 70–99)
Potassium: 3.6 meq/L (ref 3.5–5.1)
Sodium: 136 meq/L (ref 135–145)
Total Bilirubin: 0.2 mg/dL — ABNORMAL LOW (ref 0.3–1.2)
Total Protein: 8.3 g/dL (ref 6.0–8.3)

## 2013-03-26 LAB — CBC
HCT: 30.7 % — ABNORMAL LOW (ref 36.0–46.0)
Hemoglobin: 9.9 g/dL — ABNORMAL LOW (ref 12.0–15.0)
MCH: 24.6 pg — ABNORMAL LOW (ref 26.0–34.0)
MCHC: 32.2 g/dL (ref 30.0–36.0)

## 2013-03-26 LAB — POCT I-STAT TROPONIN I: Troponin i, poc: 0 ng/mL (ref 0.00–0.08)

## 2013-03-26 LAB — GLUCOSE, CAPILLARY

## 2013-03-26 LAB — TROPONIN I: Troponin I: <0.3 ng/mL (ref <0.30)

## 2013-03-26 LAB — APTT: aPTT: 39 s — ABNORMAL HIGH (ref 24–37)

## 2013-03-26 LAB — PROTIME-INR
INR: 1.3 (ref 0.00–1.49)
Prothrombin Time: 15.9 s — ABNORMAL HIGH (ref 11.6–15.2)

## 2013-03-26 NOTE — Telephone Encounter (Signed)
[name] was getting something for sleep in rehab and it was on her discharge sheet but she did not get rx for it. Please call.  It looks like on discharge note it says restoril but Remus Loffler was ordered "no print".  Jesusita Oka can you clarify this?

## 2013-03-26 NOTE — ED Notes (Signed)
Paged out CODE STROKE to [phone] 

## 2013-03-26 NOTE — Consult Note (Signed)
Reason for Consult: Monocular vision loss Referring Physician: Preston Fleeting, D  CC: Monocular vision loss  History is obtained from: Patient, mother  HPI: [name] is a 37 y.o. female was in her normal state of health and was walking earlier when she suddenly lost vision in her left eye. Her mother states that she covered each eye and the patient was able to see normally out of the right eye, but the left eye was completely black. She states that she looked in the mirror to make sure that her eye was open.  Since that time, she has had return of vision, though does not completely back to normal.  Of note she was recently discharged following a admission for stroke. She underwent revascularization interventional radiology, but had persistent left hemiparesis. She has a history of Takayasu's arteritis. She has lupus anticoagulant positivity, but I'm not sure about the diagnosis of lupus itself  LKW: Earlier tonight tpa given: no, recent infarct, resolving symptoms    ROS: A 14 point ROS was performed and is negative except as noted in the HPI.  Past Medical History  Diagnosis Date  . PVD (peripheral vascular disease)   . Benign tumor of back   . Swelling, lymph nodes 2006    intermittant, benign  . DVT (deep venous thrombosis)   . Stroke     last week here   positive lupus anticoagulant during previous stroke workup.  Family History: No history of autoimmune disease  Social History: Tob: Denies  Exam: Current vital signs: BP 127/69  Pulse 103  Resp 18  SpO2 100% Vital signs in last 24 hours: Pulse Rate:  [103] 103 (05/01 2138) Resp:  [18] 18 (05/01 2138) BP: (127)/(69) 127/69 mmHg (05/01 2138) SpO2:  [100 %] 100 % (05/01 2138)  General: In bed, no apparent distress CV: Regular rate and rhythm Mental Status: Patient is awake, alert, oriented to person, place, month, year, and situation. Immediate and remote memory are intact. Patient is able to give a clear and  coherent history. No signs of aphasia or neglect. Cranial Nerves: II: Visual Fields are full. Pupils are equal, round, and reactive to light.  Discs are without obvious pallor, or clear embolic source that I was able to visualize, though my exam was slightly limited due to the pupil size. III,IV, VI: EOMI without ptosis or diploplia.  V: Facial sensation is symmetric to temperature VII: Facial movement is symmetric.  VIII: hearing is intact to voice X: Uvula elevates symmetrically XI: Shoulder shrug is symmetric. XII: tongue is midline without atrophy or fasciculations.  Motor: Tone is normal. Bulk is normal. 5/5 strength was present in right arm and leg, her left arm is flaccid with no movement. Her left leg has 4/5 strength.  Sensory: Sensation is symmetric to light touch without extinction in the arms and legs.  Deep Tendon Reflexes: 2+ in the biceps and patellae on right, 3+ on left.  Cerebellar: FNF intact on right, unable to perform on left  Gait: Not tested due to patient safety concerns  I have reviewed labs in epic and the results pertinent to this consultation are: INR 1.3  I have reviewed the images obtained: previous MRI -   Impression: 37 yo with symptoms most likely representing CRAO with resolution. I suspect that this was an embolic event related to her hypercoagulable state. Takayasu's arteritis does not typically involve intracranial vessels.   She is currently anticoagulated with xarelto and took a dose this morning. I would  recommend changing her back to coumadin. I discussed this with her and she states she would be willing to try.   Recommendations: 1) Would start coumadin, and consider lovenox bridge(though not until tomorrow given that last dose of xarelto was today)  2) MRI brain, MRA head and Neck.  3) I would favor observation admission for above workup, and consideration of change above in anticoagulation.  4) Prednisone for takayasu's arteritis, though  unlikely contributor to tonight's event.  5) No need for full stroke workup given recent workup.    Ritta Slot, MD [facility] [phone]  If 7pm- 7am, please page neurology on call at [phone].

## 2013-03-26 NOTE — ED Notes (Signed)
Code Stroke called 21:24 Patient arrival  21:18 Last known well  20:45 EDP exam  21:19 Stroke Team arrival  21:20 Neurologist arrival  21:19 Arrival in CT  21:40 Phlebotomist arrival 21:20 CT cancelled  21:38

## 2013-03-26 NOTE — ED Provider Notes (Signed)
History     CSN: [id number]  Arrival date & time 03/26/13  2110   First MD Initiated Contact with Patient 03/26/13 2126      No chief complaint on file.   (Consider location/radiation/quality/duration/timing/severity/associated sxs/prior treatment) Patient is a 37 y.o. female presenting with neurologic complaint. The history is provided by the patient.  Neurologic Problem The primary symptoms include visual change (loss of vision in left eye). Primary symptoms do not include headaches, dizziness or fever. The symptoms began less than 1 hour ago. The symptoms are improving. The neurological symptoms are focal.  The visual change is a recurrent (similar to prior stroke) problem. The visual change has been gradually improving since its onset. The visual change is located in the entire field of vision of the left eye. The visual change includes loss of vision (painless).  Additional symptoms do not include weakness. Workup history includes MRI and CT scan.    Past Medical History  Diagnosis Date  . PVD (peripheral vascular disease)   . Benign tumor of back   . Swelling, lymph nodes 2006    intermittant, benign  . DVT (deep venous thrombosis)   . Stroke     last week here    Past Surgical History  Procedure Laterality Date  . Ivc filter placement    . Breast surgery      bil breast implants    Family History  Problem Relation Age of Onset  . Cancer - Other Father   . Coronary artery disease Mother   . Stroke Mother   . Hypertension Mother   . Hypertension Sister     History  Substance Use Topics  . Smoking status: Never Smoker   . Smokeless tobacco: Never Used  . Alcohol Use: 3.0 oz/week    5 Glasses of wine per week    OB History   Grav Para Term Preterm Abortions TAB SAB Ect Mult Living                  Review of Systems  Constitutional: Negative for fever and chills.  Neurological: Negative for dizziness, weakness, numbness and headaches.  All other  systems reviewed and are negative.    Allergies  Pork-derived products  Home Medications   Current Outpatient Rx  Name  Route  Sig  Dispense  Refill  . methylphenidate (RITALIN) 5 MG tablet   Oral   Take 1 tablet (5 mg total) by mouth 2 (two) times daily with breakfast and lunch.   60 tablet   0   . Multiple Vitamin (MULTIVITAMIN WITH MINERALS) TABS   Oral   Take 1 tablet by mouth daily.         . predniSONE (DELTASONE) 20 MG tablet   Oral   Take 20 mg by mouth daily.          . Rivaroxaban (XARELTO) 20 MG TABS   Oral   Take 1 tablet (20 mg total) by mouth daily with supper.   30 tablet   1   . traMADol (ULTRAM) 50 MG tablet   Oral   Take 1 tablet (50 mg total) by mouth every 6 (six) hours as needed.   60 tablet   0   . zolpidem (AMBIEN) 10 MG tablet   Oral   Take 1 tablet (10 mg total) by mouth at bedtime as needed for sleep.   20 tablet   0     BP 127/69  Pulse 103  Resp 18  SpO2 100%  LMP 03/24/2013  Physical Exam  Vitals reviewed. Constitutional: She is oriented to person, place, and time. She appears well-developed and well-nourished.  HENT:  Right Ear: External ear normal.  Left Ear: External ear normal.  Mouth/Throat: No oropharyngeal exudate.  Eyes: Conjunctivae and EOM are normal. Pupils are equal, round, and reactive to light.  Mild proptosis, lid lag  Neck: Normal range of motion. Neck supple.  Cardiovascular: Normal rate, regular rhythm, normal heart sounds and intact distal pulses.  Exam reveals no gallop and no friction rub.   No murmur heard. Pulmonary/Chest: Effort normal and breath sounds normal.  Abdominal: Soft. Bowel sounds are normal. She exhibits no distension. There is no tenderness.  Musculoskeletal: She exhibits no edema.  Neurological: She is alert and oriented to person, place, and time.  Vision grossly intact bilaterally EOMI Flaccid paralysis of LUE, 3/5 LLE 5/5 RUE/RLE Sensation intact grossly in all  extremities F2N intact on R No drift Left facial droop  Skin: Skin is warm and dry. No rash noted.  Psychiatric: She has a normal mood and affect.    ED Course  Procedures (including critical care time)  Labs Reviewed  PROTIME-INR - Abnormal; Notable for the following:    Prothrombin Time 15.9 (*)    All other components within normal limits  APTT - Abnormal; Notable for the following:    aPTT 39 (*)    All other components within normal limits  CBC - Abnormal; Notable for the following:    WBC 11.8 (*)    Hemoglobin 9.9 (*)    HCT 30.7 (*)    MCV 76.4 (*)    MCH 24.6 (*)    RDW 17.8 (*)    Platelets 459 (*)    All other components within normal limits  DIFFERENTIAL - Abnormal; Notable for the following:    Neutro Abs 8.7 (*)    All other components within normal limits  COMPREHENSIVE METABOLIC PANEL - Abnormal; Notable for the following:    Albumin 3.2 (*)    Total Bilirubin 0.2 (*)    All other components within normal limits  TROPONIN I  GLUCOSE, CAPILLARY  TSH  POCT I-STAT TROPONIN I  POCT PREGNANCY, URINE   Ct Head Wo Contrast  03/26/2013  *RADIOLOGY REPORT*  Clinical Data: Loss of vision in the left eye, history of stroke with residual left-sided defect  CT HEAD WITHOUT CONTRAST  Technique:  Contiguous axial images were obtained from the base of the skull through the vertex without contrast.  Comparison: 02/21/2013  Findings: No CT evidence of acute infarction.  Encephalomalacic changes related to prior right frontal lobe and basal ganglia infarct.  Prior midline shift has resolved.  No mass lesion or mass effect.  No evidence of parenchymal hemorrhage or extra-axial fluid collection.  Cerebral volume is age appropriate.  No ventriculomegaly.  The visualized paranasal sinuses are essentially clear. The mastoid air cells are unopacified.  No evidence of calvarial fracture.  IMPRESSION: No evidence of acute intracranial abnormality.  Encephalomalacic changes related to  prior right MCA distribution infarct.   Original Report Authenticated By: Charline Bills, M.D.    Date: 03/26/2013  Rate: 106  Rhythm: sinus tachycardia  QRS Axis: normal  Intervals: normal  ST/T Wave abnormalities: nonspecific ST changes  Conduction Disutrbances:none  Narrative Interpretation: Status tachycardia, and nonspecific ST changes which may be early repolarization. When compared with ECG of 02/15/2013, no significant changes are seen.  Old EKG Reviewed: unchanged   1. Embolic  infarction   2. CVA (cerebral infarction)   3. Takayasu's disease   4. Stroke   5. Amaurosis fugax of left eye   6. 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 INR on that admission was 1.14 with noted poor medical compliance on Coumadin. MRI of the brain showed acute infarct right PCA territory without hemorrhages as well as scattered small areas of acute infarct left frontal and parietal lobe.  She has since been maintained on Xarelto as she has refused Coumadin.  Exam with vision of L eye grossly intact on arrival.  Her neurologic exam showed her chronic left facial droop and left sided hemiparesis, flaccid LUE and 3/5 LLE.  No drift of RUE.  5/5 RUE and RLE strength.  Neurology was at the bedside soon after arrival and the decision was made to cancel the code stroke since her symptoms had essentially resolved.  Will proceed with CT scanning and stroke workup.  1:30 AM Images viewed with Radiologist and MR demonstrated new stroke in R thalamic area.  Hospitalist consulted for admission.  Pt states she is willing to go back on Coumadin.  Exam unchanged.  Disposition: Admit  Condition:  Stable  Pt seen in conjunction with my attending, Dr. Preston Fleeting.  Oleh Genin, MD PGY-II [facility] Emergency Medicine Resident      Oleh Genin, MD 03/27/13 [phone]

## 2013-03-26 NOTE — ED Notes (Signed)
Pt remains in MRI 

## 2013-03-26 NOTE — ED Provider Notes (Addendum)
37 year old female with history of Takayasu's arteritis and strokes with residual left hemiparesis from her prior stroke noted onset this evening of loss of vision in her left eye. This was the prodrome prior to her last stroke. She does not notice any change in the residual left-sided weakness. She denies any headache. On exam, she has a left central facial droop and flaccid paralysis of her left arm and spastic testis of her left leg. There there is noted slight proptosis and lid lag and there is some questionable fullness of the thyroid raising question of possible Graves' disease. Lungs are clear and heart has regular rate and rhythm. Symptoms are strongly suggestive of retinal artery occlusion with resolution. She will need to be admitted for further evaluation. She is on Rivaroxaban because of hypercoagulable state from a lupus anticoagulant but probably should be on warfarin. She had refused warfarin in the past.   Date: 03/26/2013  Rate: 106  Rhythm: sinus tachycardia  QRS Axis: normal  Intervals: normal  ST/T Wave abnormalities: nonspecific ST changes  Conduction Disutrbances:none  Narrative Interpretation: Status tachycardia, and nonspecific ST changes which may be early repolarization. When compared with ECG of 02/15/2013, no significant changes are seen.  Old EKG Reviewed: unchanged  CRITICAL CARE Performed by: NWGNF,AOZHY   Total critical care time: 40 minutes  Critical care time was exclusive of separately billable procedures and treating other patients.  Critical care was necessary to treat or prevent imminent or life-threatening deterioration.  Critical care was time spent personally by me on the following activities: development of treatment plan with patient and/or surrogate as well as nursing, discussions with consultants, evaluation of patient's response to treatment, examination of patient, obtaining history from patient or surrogate, ordering and performing treatments and  interventions, ordering and review of laboratory studies, ordering and review of radiographic studies, pulse oximetry and re-evaluation of patient's condition.   I saw and evaluated the patient, reviewed the resident's note and I agree with the findings and plan.   Dione Booze, MD 03/26/13 2152  Dione Booze, MD 03/30/13 [phone]

## 2013-03-27 ENCOUNTER — Encounter (HOSPITAL_COMMUNITY): Payer: Self-pay | Admitting: *Deleted

## 2013-03-27 DIAGNOSIS — H3400 Transient retinal artery occlusion, unspecified eye: Secondary | ICD-10-CM

## 2013-03-27 DIAGNOSIS — H5460 Unqualified visual loss, one eye, unspecified: Principal | ICD-10-CM

## 2013-03-27 DIAGNOSIS — H5462 Unqualified visual loss, left eye, normal vision right eye: Secondary | ICD-10-CM | POA: Diagnosis present

## 2013-03-27 DIAGNOSIS — M314 Aortic arch syndrome [Takayasu]: Secondary | ICD-10-CM

## 2013-03-27 LAB — GLUCOSE, CAPILLARY: Glucose-Capillary: 93 mg/dL (ref 70–99)

## 2013-03-27 LAB — COMPREHENSIVE METABOLIC PANEL
ALT: 11 U/L (ref 0–35)
AST: 15 U/L (ref 0–37)
Alkaline Phosphatase: 52 U/L (ref 39–117)
CO2: 26 mEq/L (ref 19–32)
Calcium: 9 mg/dL (ref 8.4–10.5)
Chloride: 102 mEq/L (ref 96–112)
GFR calc Af Amer: >90 mL/min (ref >90)
GFR calc non Af Amer: >90 mL/min (ref >90)
Glucose, Bld: 107 mg/dL — ABNORMAL HIGH (ref 70–99)
Sodium: 137 mEq/L (ref 135–145)
Total Bilirubin: 0.3 mg/dL (ref 0.3–1.2)

## 2013-03-27 LAB — LIPID PANEL
Cholesterol: 151 mg/dL (ref 0–200)
LDL Cholesterol: 79 mg/dL (ref 0–99)
Triglycerides: 61 mg/dL (ref <150)

## 2013-03-27 LAB — CBC WITH DIFFERENTIAL/PLATELET
Basophils Absolute: 0 10*3/uL (ref 0.0–0.1)
Basophils Relative: 0 % (ref 0–1)
Eosinophils Absolute: 0.1 10*3/uL (ref 0.0–0.7)
MCH: 24.4 pg — ABNORMAL LOW (ref 26.0–34.0)
MCHC: 31.5 g/dL (ref 30.0–36.0)
Monocytes Relative: 9 % (ref 3–12)
Neutro Abs: 8.3 10*3/uL — ABNORMAL HIGH (ref 1.7–7.7)
Neutrophils Relative %: 77 % (ref 43–77)
Platelets: 416 10*3/uL — ABNORMAL HIGH (ref 150–400)
RDW: 17.4 % — ABNORMAL HIGH (ref 11.5–15.5)

## 2013-03-27 LAB — RAPID URINE DRUG SCREEN, HOSP PERFORMED
Barbiturates: NOT DETECTED
Benzodiazepines: NOT DETECTED

## 2013-03-27 LAB — HEMOGLOBIN A1C: Hgb A1c MFr Bld: 5.6 % (ref <5.7)

## 2013-03-27 LAB — TSH: TSH: 2.368 u[IU]/mL (ref 0.350–4.500)

## 2013-03-27 LAB — POCT PREGNANCY, URINE: Preg Test, Ur: NEGATIVE

## 2013-03-27 MED ORDER — ENOXAPARIN SODIUM 80 MG/0.8ML ~~LOC~~ SOLN
1.0000 mg/kg | Freq: Two times a day (BID) | SUBCUTANEOUS | Status: DC
  Administered 2013-03-27 – 2013-03-29 (×5): 65 mg via SUBCUTANEOUS
  Filled 2013-03-27 (×8): qty 0.8

## 2013-03-27 MED ORDER — WARFARIN VIDEO: 1.0000 | Freq: Once | Status: DC

## 2013-03-27 MED ORDER — COUMADIN BOOK
1.0000 | Freq: Once | Status: DC
  Filled 2013-03-27: qty 1

## 2013-03-27 MED ORDER — PREDNISONE 20 MG PO TABS
20.0000 mg | ORAL_TABLET | Freq: Every day | ORAL | Status: DC
  Administered 2013-03-27 – 2013-03-29 (×3): 20 mg via ORAL
  Filled 2013-03-27 (×5): qty 1

## 2013-03-27 MED ORDER — WARFARIN SODIUM 7.5 MG PO TABS
7.5000 mg | ORAL_TABLET | Freq: Once | ORAL | Status: AC
  Administered 2013-03-27: 7.5 mg via ORAL
  Filled 2013-03-27: qty 1

## 2013-03-27 MED ORDER — ZOLPIDEM TARTRATE 5 MG PO TABS: 5.0000 mg | ORAL_TABLET | Freq: Every evening | ORAL | Status: DC | PRN

## 2013-03-27 MED ORDER — ZOLPIDEM TARTRATE 5 MG PO TABS: 10.0000 mg | ORAL_TABLET | Freq: Every evening | ORAL | Status: DC | PRN

## 2013-03-27 MED ORDER — TRAMADOL HCL 50 MG PO TABS
50.0000 mg | ORAL_TABLET | Freq: Four times a day (QID) | ORAL | Status: DC | PRN
  Administered 2013-03-27 – 2013-03-29 (×2): 50 mg via ORAL
  Filled 2013-03-27 (×2): qty 1

## 2013-03-27 MED ORDER — TEMAZEPAM 7.5 MG PO CAPS
7.5000 mg | ORAL_CAPSULE | Freq: Every day | ORAL | Status: DC
  Administered 2013-03-27 – 2013-03-28 (×2): 7.5 mg via ORAL
  Filled 2013-03-27 (×2): qty 1

## 2013-03-27 MED ORDER — METHYLPHENIDATE HCL 5 MG PO TABS
5.0000 mg | ORAL_TABLET | Freq: Two times a day (BID) | ORAL | Status: DC
  Administered 2013-03-27 – 2013-03-29 (×5): 5 mg via ORAL
  Filled 2013-03-27 (×5): qty 1

## 2013-03-27 MED ORDER — GADOBENATE DIMEGLUMINE 529 MG/ML IV SOLN
13.0000 mL | Freq: Once | INTRAVENOUS | Status: AC
  Administered 2013-03-27: 13 mL via INTRAVENOUS

## 2013-03-27 MED ORDER — WARFARIN - PHARMACIST DOSING INPATIENT
Freq: Every day | Status: DC
  Administered 2013-03-28: 18:00:00

## 2013-03-27 MED ORDER — SODIUM CHLORIDE 0.9 % IV SOLN
INTRAVENOUS | Status: DC
  Administered 2013-03-27: 20 mL/h via INTRAVENOUS

## 2013-03-27 NOTE — Progress Notes (Signed)
[facility] PROGRESS NOTE  Assessment/Plan: Vision loss of left eye - MRI/MRA head, dc/d xarelto started lovenox and coumadin. - started on prednisone.    CVA (cerebral infarction): - Now on lovenox and coumadin per above.    TAKAYASU'S ARTERITIS - steroids   Code Status: full Family Communication: none  Disposition Plan: Inpatient   Consultants:  Neurology  Procedures:  MRi  MRA  Antibiotics:  none (indicate start date, and stop date if known)  HPI/Subjective: Sleepy no complains.  Objective: Filed Vitals:   03/27/13 0408 03/27/13 0455 03/27/13 0553 03/27/13 0717  BP:  123/73  125/82  Pulse:  87  99  Temp:   98.2 F (36.8 C) 98.1 F (36.7 C)  TempSrc:   Oral Oral  Resp:    18  Height: 5' 8.11" (1.73 m) 5\' 8"  (1.727 m)    Weight: 65.5 kg (144 lb 6.4 oz) 66.1 kg (145 lb 11.6 oz)    SpO2:  99%  100%   No intake or output data in the 24 hours ending 03/27/13 0812 Filed Weights   03/27/13 0408 03/27/13 0455  Weight: 65.5 kg (144 lb 6.4 oz) 66.1 kg (145 lb 11.6 oz)    Exam:  General: Alert, awake, oriented x3, in no acute distress.  HEENT: No bruits, no goiter.  Heart: Regular rate and rhythm, without murmurs, rubs, gallops.  Lungs: Good air movement, clear to auscultation.  Abdomen: Soft, nontender, nondistended, positive bowel sounds.  Neuro: Grossly intact, nonfocal.   Data Reviewed: Basic Metabolic Panel:  Recent Labs Lab 03/26/13 2131 03/27/13 0630  NA 136 137  K 3.6 3.4*  CL 100 102  CO2 23 26  GLUCOSE 95 107*  BUN 7 6  CREATININE 0.81 0.70  CALCIUM 9.4 9.0   Liver Function Tests:  Recent Labs Lab 03/26/13 2131 03/27/13 0630  AST 18 15  ALT 13 11  ALKPHOS 58 52  BILITOT 0.2* 0.3  PROT 8.3 7.2  ALBUMIN 3.2* 2.8*   No results found for this basename: LIPASE, AMYLASE,  in the last 168 hours No results found for this basename: AMMONIA,  in the last 168 hours CBC:  Recent Labs Lab 03/26/13 2131 03/27/13 0630   WBC 11.8* 10.8*  NEUTROABS 8.7* 8.3*  HGB 9.9* 8.8*  HCT 30.7* 27.9*  MCV 76.4* 77.3*  PLT 459* 416*   Cardiac Enzymes:  Recent Labs Lab 03/26/13 2131  TROPONINI <0.30   BNP (last 3 results) No results found for this basename: PROBNP,  in the last 8760 hours CBG:  Recent Labs Lab 03/26/13 2134  GLUCAP 98    Recent Results (from the past 240 hour(s))  MRSA PCR SCREENING     Status: None   Collection Time    03/27/13  5:45 AM      Result Value Range Status   MRSA by PCR NEGATIVE  NEGATIVE Final   Comment:            The GeneXpert MRSA Assay (FDA     approved for NASAL specimens     only), is one component of a     comprehensive MRSA colonization     surveillance program. It is not     intended to diagnose MRSA     infection nor to guide or     monitor treatment for     MRSA infections.     Studies: Ct Head Wo Contrast  03/26/2013  *RADIOLOGY REPORT*  Clinical Data: Loss of vision in the left  eye, history of stroke with residual left-sided defect  CT HEAD WITHOUT CONTRAST  Technique:  Contiguous axial images were obtained from the base of the skull through the vertex without contrast.  Comparison: 02/21/2013  Findings: No CT evidence of acute infarction.  Encephalomalacic changes related to prior right frontal lobe and basal ganglia infarct.  Prior midline shift has resolved.  No mass lesion or mass effect.  No evidence of parenchymal hemorrhage or extra-axial fluid collection.  Cerebral volume is age appropriate.  No ventriculomegaly.  The visualized paranasal sinuses are essentially clear. The mastoid air cells are unopacified.  No evidence of calvarial fracture.  IMPRESSION: No evidence of acute intracranial abnormality.  Encephalomalacic changes related to prior right MCA distribution infarct.   Original Report Authenticated By: Charline Bills, M.D.     Scheduled Meds: . coumadin book  1 each Does not apply Once  . enoxaparin (LOVENOX) injection  1 mg/kg  Subcutaneous Q12H  . methylphenidate  5 mg Oral BID WC  . predniSONE  20 mg Oral Q breakfast  . warfarin  7.5 mg Oral ONCE-1800  . warfarin  1 each Does not apply Once  . Warfarin - Pharmacist Dosing Inpatient   Does not apply q1800   Continuous Infusions: . sodium chloride 20 mL/hr (03/27/13 0533)     Marinda Elk  [facility] Pager [phone]. If 8PM-8AM, please contact night-coverage at [email or web address], password [facility] 03/27/2013, 8:12 AM  LOS: 1 day

## 2013-03-27 NOTE — ED Notes (Signed)
Pt returned from CT and MRI.  Pt's mother at bedside.

## 2013-03-27 NOTE — Progress Notes (Signed)
ANTICOAGULATION CONSULT NOTE - Initial Consult  Pharmacy Consult for Lovenox, Coumadin Indication: H/o lupus anticoagulant, DVT, stroke  Allergies  Allergen Reactions  . Pork-Derived Products     Patient Measurements: Height: 5' 8.11" (173 cm) Weight: 144 lb 6.4 oz (65.5 kg) (Per 02/27/13 documentation) IBW/kg (Calculated) : 64.15  Vital Signs: Temp: 98.2 F (36.8 C) (05/02 0341) Temp src: Oral (05/02 0341) BP: 126/71 mmHg (05/02 0341) Pulse Rate: 98 (05/02 0341)  Labs:  Recent Labs  03/26/13 2131  HGB 9.9*  HCT 30.7*  PLT 459*  APTT 39*  LABPROT 15.9*  INR 1.30  CREATININE 0.81  TROPONINI <0.30    Estimated Creatinine Clearance: 95.4 ml/min (by C-G formula based on Cr of 0.81).   Medical History: Past Medical History  Diagnosis Date  . PVD (peripheral vascular disease)   . Benign tumor of back   . Swelling, lymph nodes 2006    intermittant, benign  . DVT (deep venous thrombosis)   . Stroke     last week here    Medications:  Scheduled:  . [COMPLETED] gadobenate dimeglumine  13 mL Intravenous Once  . methylphenidate  5 mg Oral BID WC  . predniSONE  20 mg Oral Q breakfast    Assessment: 37 yo female with h/o DVT, stroke and lupus anticoagulant presented with monocular vision loss - possible embolic event to retinal artery per neurology. Patient was previously on rivaroxaban with last dose 5/1 at 8AM per patient report. Pharmacy to manage Lovenox and Coumadin. Confirmed with Dr. Amada Jupiter requested dose of Lovenox to be therapeutic anticoagulation (1mg /kg Q12H) and not DVT prophylaxis dosing.   Goal of Therapy:  Anti-Xa level 0.6-1.2 units/ml 4hrs after LMWH dose given INR 2 - 3 Monitor platelets by anticoagulation protocol: Yes   Plan:  1. Lovenox 65mg  SQ Q12H starting at 8AM.  2. CBC Q72H while on Lovenox.  3. Coumadin 7.5mg  po today 4. Daily PT / INR 5. Coumadin education with pharmacist 6. Coumadin book / video  Emeline Gins 03/27/2013,4:59 AM

## 2013-03-27 NOTE — Progress Notes (Signed)
Pt came from the ed to 4n08, pt is alert x4, lt arm flaccid and lt leg weakness from previous cva, no vision problem. Will continue to monitor.---Mayleigh Tetrault, rn

## 2013-03-27 NOTE — ED Notes (Signed)
Attempted to call report to floor.  Was told RN would return my call 

## 2013-03-27 NOTE — Progress Notes (Signed)
Pt stated she left her walker in the ER on 03/26/13 and the ED was called and the walker was not located. Spoke with Diplomatic Services operational officer and she stated she will contact security to inform them of the walker.

## 2013-03-27 NOTE — Progress Notes (Signed)
Stroke Team Progress Note  HISTORY SENORA LACSON is a 37 y.o. female was in her normal state of health and was walking earlier on 03/26/2013 when she suddenly lost vision in her left eye. Her mother states that she covered each eye and the patient was able to see normally out of the right eye, but the left eye was completely black. She states that she looked in the mirror to make sure that her eye was open. Since that time, she has had return of vision, though does not completely back to normal.  Of note she was recently discharged following a admission for stroke. She underwent revascularization interventional radiology, but had persistent left hemiparesis. She has a history of Takayasu's arteritis. She has lupus anticoagulant positivity, but I'm not sure about the diagnosis of lupus itself Patient was not a TPA candidate secondary to stroke within 90 days. She was admitted for further evaluation and treatment.  SUBJECTIVE Her female friend is at the bedside.  Overall she feels her condition is completely resolved. She feels back to baseline.  OBJECTIVE Most recent Vital Signs: Filed Vitals:   03/27/13 0455 03/27/13 0553 03/27/13 0717 03/27/13 0900  BP: 123/73  125/82 120/76  Pulse: 87  99 103  Temp:  98.2 F (36.8 C) 98.1 F (36.7 C) 98.3 F (36.8 C)  TempSrc:  Oral Oral Oral  Resp:   18 18  Height: 5\' 8"  (1.727 m)     Weight: 66.1 kg (145 lb 11.6 oz)     SpO2: 99%  100% 99%   CBG (last 3)   Recent Labs  03/26/13 2134  GLUCAP 98    IV Fluid Intake:   . sodium chloride 20 mL/hr (03/27/13 0533)    MEDICATIONS  . coumadin book  1 each Does not apply Once  . enoxaparin (LOVENOX) injection  1 mg/kg Subcutaneous Q12H  . methylphenidate  5 mg Oral BID WC  . predniSONE  20 mg Oral Q breakfast  . warfarin  7.5 mg Oral ONCE-1800  . warfarin  1 each Does not apply Once  . Warfarin - Pharmacist Dosing Inpatient   Does not apply q1800   PRN:  traMADol, zolpidem  Diet:  Cardiac thin  liquids Activity:   Bathroom privileges with assistance DVT Prophylaxis:  Full dose lovenox, coumadin  CLINICALLY SIGNIFICANT STUDIES Basic Metabolic Panel:  Recent Labs Lab 03/26/13 2131 03/27/13 0630  NA 136 137  K 3.6 3.4*  CL 100 102  CO2 23 26  GLUCOSE 95 107*  BUN 7 6  CREATININE 0.81 0.70  CALCIUM 9.4 9.0   Liver Function Tests:  Recent Labs Lab 03/26/13 2131 03/27/13 0630  AST 18 15  ALT 13 11  ALKPHOS 58 52  BILITOT 0.2* 0.3  PROT 8.3 7.2  ALBUMIN 3.2* 2.8*   CBC:  Recent Labs Lab 03/26/13 2131 03/27/13 0630  WBC 11.8* 10.8*  NEUTROABS 8.7* 8.3*  HGB 9.9* 8.8*  HCT 30.7* 27.9*  MCV 76.4* 77.3*  PLT 459* 416*   Coagulation:  Recent Labs Lab 03/26/13 2131  LABPROT 15.9*  INR 1.30   Cardiac Enzymes:  Recent Labs Lab 03/26/13 2131  TROPONINI <0.30   Urinalysis: No results found for this basename: COLORURINE, APPERANCEUR, LABSPEC, PHURINE, GLUCOSEU, HGBUR, BILIRUBINUR, KETONESUR, PROTEINUR, UROBILINOGEN, NITRITE, LEUKOCYTESUR,  in the last 168 hours Lipid Panel    Component Value Date/Time   CHOL 151 03/27/2013 0630   TRIG 61 03/27/2013 0630   HDL 60 03/27/2013 0630   CHOLHDL  2.5 03/27/2013 0630   VLDL 12 03/27/2013 0630   LDLCALC 79 03/27/2013 0630   HgbA1C  Lab Results  Component Value Date   HGBA1C 6.0* 02/16/2013    Urine Drug Screen:     Component Value Date/Time   LABOPIA NONE DETECTED 03/27/2013 0247   COCAINSCRNUR NONE DETECTED 03/27/2013 0247   LABBENZ NONE DETECTED 03/27/2013 0247   AMPHETMU NONE DETECTED 03/27/2013 0247   THCU NONE DETECTED 03/27/2013 0247   LABBARB NONE DETECTED 03/27/2013 0247    Alcohol Level: No results found for this basename: ETH,  in the last 168 hours  CT of the brain  03/26/2013   No evidence of acute intracranial abnormality.  Encephalomalacic changes related to prior right MCA distribution infarct.    MRI of the brain  03/27/2013   This patient has had a recent right MCA infarct.  There appears to be extension of  acute infarct in the head of the caudate and right medial thalamus.  There may also be some extension of infarction within the insula and right frontal lobe.    MRA of the brain 03/27/2013  Occluded left internal carotid artery.  Occluded left vertebral artery with distal reconstitution and minimal flow in the distal segment.  Right vertebral artery is patent.  The supraclinoid internal carotid artery on the right is occluded, new since the last study.  The anterior circulation appears to be only perfused via the left posterior communicating artery.  Diffuse intracranial vascular irregularity suggestive of arteritis.    MRA of the neck   03/27/2013  Right carotid bifurcation is widely patent.  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  Occluded left common carotid, left internal carotid, and left external carotid arteries.  Occluded left vertebral artery with faint reconstitution of the distal left vertebral artery.  Left subclavian artery is patent.  Right vertebral artery is patent without significant stenosis.  2D Echocardiogram  02/18/2013 EF 60% with no source of embolus.   Carotid Doppler  See MRA neck  EKG  sinus tachycardia.   Therapy Recommendations continue OP rehab following discharge  Physical Exam   Young African American lady currently not in distress.Awake alert. Afebrile. Head is nontraumatic. Neck is supple without bruit. Hearing is normal. Cardiac exam no murmur or gallop. Lungs are clear to auscultation. Distal pulses are well felt. Neurological Exam : Awake alert oriented x 3 normal speech and language. Mild left lower face asymmetry. Tongue midline. Dense left upper extremity plegia with 0/5 strength and diminished tone. Mild left lower extremity proximal and ankle dorsiflexor weakness 4/5.Marland Kitchen Diminished left hemibody sensation .gait deferred. ASSESSMENT Ms. AMERY VANDENBOS is a 37 y.o. female presenting with acute left eye vision loss that lasted 20 mins and  resolved. Imaging confirms no acute stroke. 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  On xarelto prior to admission (patient had refused coumadin at that time). Now changed to  warfarin and and full dose lovenox until INR therapeutic for secondary stroke prevention. Patient no new neuro symptoms, vision loss resolved.    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  HgbA1c 6.0  Hospital day # 1  TREATMENT/PLAN  Continue full dose lovenox until INR therapeutic on  warfarin for secondary stroke prevention.  I had a long discussion with the patient with regards to compliance with warfarin and answered questions  Patient requests change from ambien to restoril and for med administration at 8p instead of 10p. Dr. Pearlean Brownie to discuss with Dr. Judd Gaudier, MSN, RN, ANVP-BC, ANP-BC, GNP-BC [facility] Pager: [phone] 03/27/2013 11:14 AM  I have personally obtained a history, examined the patient, evaluated imaging results, and formulated the assessment and plan of care. I agree with the above. Delia Heady, MD

## 2013-03-27 NOTE — H&P (Signed)
[facility] History and Physical  [name] NWG:[id number] DOB: May 15, 1976 DOA: 03/26/2013  Referring physician: ER physician. PCP: No PCP Per Patient  Specialists: None.  Chief Complaint: Left eye vision loss.  HPI: [name] is a 37 y.o. female history of Takayasu's arteritis and recent admission last March 6 weeks ago for acute CVA with left-sided hemiparesis started experiencing left-sided visual loss last evening. It lasted for almost half an hour after which patient started regaining admission. She was brought to the ER and on-call Neurologist Dr. Amada Jupiter has already seen and evaluated and at this time feels that patient may be having embolic event to her retinal artery. Patient is on xarelto. Patient otherwise denies any new focal deficits denies any headache nausea vomiting. CT head was negative for any acute. MRI and MRA of the brain has been done and is pending. Patient will be admitted for further management. At this time neurologist has recommended to change the tube to Coumadin with Lovenox bridging. Patient has hypercoagulability with possible lupus anticoagulant disease. Patient has had previous history of DVT. Patient has been noncompliant with her prednisone for Takayasu's arteritis.  Review of Systems: As presented in the history of presenting illness, rest negative.  Past Medical History  Diagnosis Date  . PVD (peripheral vascular disease)   . Benign tumor of back   . Swelling, lymph nodes 2006    intermittant, benign  . DVT (deep venous thrombosis)   . Stroke     last week here   Past Surgical History  Procedure Laterality Date  . Ivc filter placement    . Breast surgery      bil breast implants   Social History:  reports that she has never smoked. She has never used smokeless tobacco. She reports that she drinks about 3.0 ounces of alcohol per week. She reports that she does not use illicit drugs. Lives at home. where does patient live-- Not sure.  Can patient participate in ADLs?  Allergies  Allergen Reactions  . Pork-Derived Products     Family History  Problem Relation Age of Onset  . Cancer - Other Father   . Coronary artery disease Mother   . Stroke Mother   . Hypertension Mother   . Hypertension Sister       Prior to Admission medications   Medication Sig Start Date End Date Taking? Authorizing Provider  methylphenidate (RITALIN) 5 MG tablet Take 1 tablet (5 mg total) by mouth 2 (two) times daily with breakfast and lunch. 03/20/13  Yes Mcarthur Rossetti Angiulli, PA-C  Multiple Vitamin (MULTIVITAMIN WITH MINERALS) TABS Take 1 tablet by mouth daily. 03/20/13  Yes Daniel J Angiulli, PA-C  predniSONE (DELTASONE) 20 MG tablet Take 20 mg by mouth daily.  11/03/12  Yes Hannah Muthersbaugh, PA-C  Rivaroxaban (XARELTO) 20 MG TABS Take 1 tablet (20 mg total) by mouth daily with supper. 03/20/13  Yes Daniel J Angiulli, PA-C  traMADol (ULTRAM) 50 MG tablet Take 1 tablet (50 mg total) by mouth every 6 (six) hours as needed. 03/20/13  Yes Daniel J Angiulli, PA-C  zolpidem (AMBIEN) 10 MG tablet Take 1 tablet (10 mg total) by mouth at bedtime as needed for sleep. 03/20/13  Yes Charlton Amor, PA-C   Physical Exam: Filed Vitals:   03/26/13 2138  BP: 127/69  Pulse: 103  Resp: 18  SpO2: 100%     General:  Well-developed and nourished.  Eyes: Presently able to see in both eyes. Anicteric no pallor.  ENT: No discharge from ears eyes nose or mouth.  Neck: No mass felt.  Cardiovascular: S1-S2 heard.  Respiratory: No rhonchi or crepitations.  Abdomen: Soft nontender bowel sounds present.  Skin: No rash.  Musculoskeletal: Unable to move the left upper extremity.  Psychiatric: Appears normal.  Neurologic: Alert awake oriented to time place and person. Unable to move left upper extremity. 3 x 5 strength in the left lower extremity. Right upper and lower extremity is 5 x 5. No facial asymmetry. Tongue is midline.  Labs on Admission:   Basic Metabolic Panel:  Recent Labs Lab 03/26/13 2131  NA 136  K 3.6  CL 100  CO2 23  GLUCOSE 95  BUN 7  CREATININE 0.81  CALCIUM 9.4   Liver Function Tests:  Recent Labs Lab 03/26/13 2131  AST 18  ALT 13  ALKPHOS 58  BILITOT 0.2*  PROT 8.3  ALBUMIN 3.2*   No results found for this basename: LIPASE, AMYLASE,  in the last 168 hours No results found for this basename: AMMONIA,  in the last 168 hours CBC:  Recent Labs Lab 03/26/13 2131  WBC 11.8*  NEUTROABS 8.7*  HGB 9.9*  HCT 30.7*  MCV 76.4*  PLT 459*   Cardiac Enzymes:  Recent Labs Lab 03/26/13 2131  TROPONINI <0.30    BNP (last 3 results) No results found for this basename: PROBNP,  in the last 8760 hours CBG:  Recent Labs Lab 03/26/13 2134  GLUCAP 98    Radiological Exams on Admission: Ct Head Wo Contrast  03/26/2013  *RADIOLOGY REPORT*  Clinical Data: Loss of vision in the left eye, history of stroke with residual left-sided defect  CT HEAD WITHOUT CONTRAST  Technique:  Contiguous axial images were obtained from the base of the skull through the vertex without contrast.  Comparison: 02/21/2013  Findings: No CT evidence of acute infarction.  Encephalomalacic changes related to prior right frontal lobe and basal ganglia infarct.  Prior midline shift has resolved.  No mass lesion or mass effect.  No evidence of parenchymal hemorrhage or extra-axial fluid collection.  Cerebral volume is age appropriate.  No ventriculomegaly.  The visualized paranasal sinuses are essentially clear. The mastoid air cells are unopacified.  No evidence of calvarial fracture.  IMPRESSION: No evidence of acute intracranial abnormality.  Encephalomalacic changes related to prior right MCA distribution infarct.   Original Report Authenticated By: Charline Bills, M.D.     EKG: Independently reviewed. Sinus tachycardia.  Assessment/Plan Principal Problem:   Vision loss of left eye Active Problems:   TAKAYASU'S ARTERITIS    THROMBOSIS, VENOUS   CVA (cerebral infarction)   1. Left eye vision loss - at this time neurologist feels that patient could be having an embolic event to the retinal artery. They have recommended to discontinue xarelto and start patient on Coumadin with Lovenox bridging. Lovenox will be started in a.m. as patient has had a result to dose. Patient will be placed on neurochecks and swallow evaluation. Further recommendations per neurologist. 2. History of DVT and hypercoagulable status - patient at this time has been transitioned to Coumadin Lovenox from xarelto. See #1. 3. Takayasu's arteritis - patient has been noncompliant with steroids. Prednisone has been restarted. 4. Anemia - follow CBC.    Code Status: Full code.  Family Communication: None.  Disposition Plan: Admit to inpatient.    Darbie Biancardi N. [facility] Pager [phone].  If 7PM-7AM, please contact night-coverage [email or web address] Password [email or web address] 03/27/2013, 2:10 AM

## 2013-03-27 NOTE — Progress Notes (Signed)
[facility]  Patient Status: Active (receiving services up to time of hospitalization)  [facility] is providing the following services: PT, OT and ST  If patient discharges after hours, please call [phone].   [name] 03/27/2013, 5:47 PM

## 2013-03-28 DIAGNOSIS — I749 Embolism and thrombosis of unspecified artery: Secondary | ICD-10-CM

## 2013-03-28 DIAGNOSIS — D509 Iron deficiency anemia, unspecified: Secondary | ICD-10-CM

## 2013-03-28 LAB — PROTIME-INR: Prothrombin Time: 15.8 seconds — ABNORMAL HIGH (ref 11.6–15.2)

## 2013-03-28 MED ORDER — FERROUS SULFATE 325 (65 FE) MG PO TABS
325.0000 mg | ORAL_TABLET | Freq: Three times a day (TID) | ORAL | Status: DC
  Administered 2013-03-28 – 2013-03-29 (×4): 325 mg via ORAL
  Filled 2013-03-28 (×6): qty 1

## 2013-03-28 MED ORDER — WARFARIN SODIUM 7.5 MG PO TABS
7.5000 mg | ORAL_TABLET | Freq: Once | ORAL | Status: AC
  Administered 2013-03-28: 7.5 mg via ORAL
  Filled 2013-03-28: qty 1

## 2013-03-28 MED ORDER — POLYETHYLENE GLYCOL 3350 17 G PO PACK
17.0000 g | PACK | Freq: Every day | ORAL | Status: DC | PRN
  Filled 2013-03-28: qty 1

## 2013-03-28 NOTE — Progress Notes (Signed)
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 - started on prednisone. - Pt evaluation pending.  TAKAYASU'S ARTERITIS - steroids. - + lupus anticoagulant in the past, has a rheumatologist [facility]) who follows her as an outpatient.  Deep vein thrombosis w/ IVC filter placement Jan 2014 - Initially on xarelto. - now on lovenox and coumadin.  Anemia: - stable at 8.8 now sign of overt bleeding. - menstruating female. MCV 77. - iron therapy.  Code Status: full Family Communication: none  Disposition Plan: Inpatient   Consultants:  Neurology  Procedures:  MRi  MRA  Antibiotics:  none (indicate start date, and stop date if known)  HPI/Subjective: Laying in bed comfortable.  Objective: Filed Vitals:   03/27/13 1346 03/27/13 1739 03/28/13 0132 03/28/13 0524  BP: 120/67 120/65 108/67 116/71  Pulse: 112 110 78 99  Temp: 99.1 F (37.3 C) 98.8 F (37.1 C) 97.6 F (36.4 C) 97.9 F (36.6 C)  TempSrc: Oral Oral Oral Oral  Resp: 18 18 16 16   Height:      Weight:      SpO2: 100% 94% 100% 99%    Intake/Output Summary (Last 24 hours) at 03/28/13 0805 Last data filed at 03/27/13 1700  Gross per 24 hour  Intake    759 ml  Output      0 ml  Net    759 ml   Filed Weights   03/27/13 0408 03/27/13 0455  Weight: 65.5 kg (144 lb 6.4 oz) 66.1 kg (145 lb 11.6 oz)    Exam:  General: Alert, awake, oriented x3, in no acute distress.  HEENT: No bruits, no goiter.  Heart: Regular rate and rhythm, without murmurs, rubs, gallops.  Lungs: Good air movement, clear to auscultation.  Abdomen: Soft, nontender, nondistended, positive bowel sounds.  Neuro: Grossly intact, nonfocal.   Data Reviewed: Basic Metabolic Panel:  Recent Labs Lab 03/26/13 2131 03/27/13 0630  NA 136 137  K 3.6 3.4*  CL 100 102  CO2 23 26  GLUCOSE 95 107*  BUN 7 6   CREATININE 0.81 0.70  CALCIUM 9.4 9.0   Liver Function Tests:  Recent Labs Lab 03/26/13 2131 03/27/13 0630  AST 18 15  ALT 13 11  ALKPHOS 58 52  BILITOT 0.2* 0.3  PROT 8.3 7.2  ALBUMIN 3.2* 2.8*   No results found for this basename: LIPASE, AMYLASE,  in the last 168 hours No results found for this basename: AMMONIA,  in the last 168 hours CBC:  Recent Labs Lab 03/26/13 2131 03/27/13 0630  WBC 11.8* 10.8*  NEUTROABS 8.7* 8.3*  HGB 9.9* 8.8*  HCT 30.7* 27.9*  MCV 76.4* 77.3*  PLT 459* 416*   Cardiac Enzymes:  Recent Labs Lab 03/26/13 2131  TROPONINI <0.30   BNP (last 3 results) No results found for this basename: PROBNP,  in the last 8760 hours CBG:  Recent Labs Lab 03/26/13 2134 03/27/13 1123 03/27/13 1701  GLUCAP 98 93 121*    Recent Results (from the past 240 hour(s))  MRSA PCR SCREENING     Status: None   Collection Time    03/27/13  5:45 AM      Result Value Range Status   MRSA by PCR NEGATIVE  NEGATIVE Final   Comment:            The GeneXpert MRSA Assay (FDA     approved for NASAL specimens  only), is one component of a     comprehensive MRSA colonization     surveillance program. It is not     intended to diagnose MRSA     infection nor to guide or     monitor treatment for     MRSA infections.     Studies: Ct Head Wo Contrast  03/26/2013  *RADIOLOGY REPORT*  Clinical Data: Loss of vision in the left eye, history of stroke with residual left-sided defect  CT HEAD WITHOUT CONTRAST  Technique:  Contiguous axial images were obtained from the base of the skull through the vertex without contrast.  Comparison: 02/21/2013  Findings: No CT evidence of acute infarction.  Encephalomalacic changes related to prior right frontal lobe and basal ganglia infarct.  Prior midline shift has resolved.  No mass lesion or mass effect.  No evidence of parenchymal hemorrhage or extra-axial fluid collection.  Cerebral volume is age appropriate.  No  ventriculomegaly.  The visualized paranasal sinuses are essentially clear. The mastoid air cells are unopacified.  No evidence of calvarial fracture.  IMPRESSION: No evidence of acute intracranial abnormality.  Encephalomalacic changes related to prior right MCA distribution infarct.   Original Report Authenticated By: Charline Bills, M.D.    Mr [facility] Wo Contrast  03/27/2013  *RADIOLOGY REPORT*  Clinical Data:  Sudden loss of vision left eye.  History of recent stroke.  Takayasu arteritis.  MRI HEAD WITHOUT AND WITH CONTRAST MRA HEAD WITHOUT CONTRAST MRA NECK WITHOUT AND WITH CONTRAST  Technique:  Multiplanar, multiecho pulse sequences of the brain and surrounding structures were obtained without and with intravenous contrast.  Angiographic images of the Circle of Willis were obtained using MRA technique without intravenous contrast. Angiographic images of the neck were obtained using MRA technique without and with intravenous contrast.  Carotid stenosis measurements (when applicable) are obtained utilizing NASCET criteria, using the distal internal carotid diameter as the denominator.  Contrast: 13mL MULTIHANCE GADOBENATE DIMEGLUMINE 529 MG/ML IV SOLN  Comparison:  CT 03/26/2013.  MRI 02/17/2013.  MR angiogram 12/05/2012  MRI HEAD  Findings:  The patient had an acute right MCA infarct on 02/17/2013, involving the insula and posterior frontal lobe.  Some of this area continues to show increased signal on diffusion weighted imaging.  Some this is most likely T2 shine through, however there also is superimposed acute infarction.  There appears to be extension of infarct now involving the head of the caudate and right medial thalamus.  These areas do not show infarction on the prior study.  There may also be some additional acute infarct in the right posterior frontal cortex. No acute infarct is seen on the left or the posterior fossa.  Ventricle size is normal.  No shift of midline structures.  No intracranial  hemorrhage is identified.  Postcontrast imaging reveals enhancement of the infarct in the right lateral basal ganglia, insula, and right frontal lobe.  This suggests  a subacute element to the infarction.  The patient has occluded right MCA and some of the enhancement may be leptomeningeal collateral circulation as well as some luxury perfusion of subacute infarcted tissue.  Marland Kitchen  IMPRESSION: This patient has had a recent right MCA infarct.  There appears to be extension of acute infarct in the head of the caudate and right medial thalamus.  There may also be some extension of infarction within the insula and right frontal lobe.  MRA HEAD  Findings: The right vertebral artery is widely patent to the basilar.  The proximal left vertebral artery is occluded with some reconstitution and faint flow in the distal left vertebral artery which is severely diseased.  The basilar is widely patent. Posterior communicating arteries are patent bilaterally.  The posterior cerebral artery is patent bilaterally but is irregular due to diffuse disease.  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  The cervical internal carotid artery on the right is patent.  The cavernous carotid artery is patent.  There is occlusion of the supraclinoid internal carotid artery on the right, which is new since the last study.  The right middle cerebral artery has minimal if any flow.  On source images there may be some trickle flow in the right middle cerebral artery via collaterals.  The cavernous carotid on the left is occluded.  The supraclinoid internal carotid artery on the left is supplied via the posterior communicating artery.  This supplies the left middle cerebral artery which is patent but appears to be diffusely diseased.  This is also supplying both anterior cerebral arteries.  IMPRESSION: Occluded left internal carotid artery.  Occluded left vertebral artery with distal reconstitution and  minimal flow in the distal segment.  Right vertebral artery is patent.  The supraclinoid internal carotid artery on the right is occluded, new since the last study.  The anterior circulation appears to be only perfused via the left posterior communicating artery.  Diffuse intracranial vascular irregularity suggestive of arteritis.  MRA NECK  Findings: The innominate artery is patent.  Proximal right subclavian artery is patent.  Right common carotid artery is widely patent.  The right carotid bifurcation is widely patent.  The cervical internal carotid artery is patent.  There is occlusion of the supraclinoid right internal carotid artery as above.  Left common carotid artery is occluded at the origin.  No flow is seen in the left internal carotid artery.  Left subclavian artery is occluded at the origin. There is reconstitution of the distal left vertebral   artery via collaterals with a small amount of flow in the distal left vertebral artery.  The right vertebral artery is patent without significant stenosis.  IMPRESSION: Right carotid bifurcation is widely patent.  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  Occluded left common carotid, left internal carotid, and left external carotid arteries.  Occluded left vertebral artery with faint reconstitution of the distal left vertebral artery.  Left subclavian artery is patent.  Right vertebral artery is patent without significant stenosis.   Original Report Authenticated By: Janeece Riggers, M.D.    Mr Angiogram Neck W Wo Contrast  03/27/2013  *RADIOLOGY REPORT*  Clinical Data:  Sudden loss of vision left eye.  History of recent stroke.  Takayasu arteritis.  MRI HEAD WITHOUT AND WITH CONTRAST MRA HEAD WITHOUT CONTRAST MRA NECK WITHOUT AND WITH CONTRAST  Technique:  Multiplanar, multiecho pulse sequences of the brain and surrounding structures were obtained without and with intravenous contrast.  Angiographic images of the Circle of Willis were  obtained using MRA technique without intravenous contrast. Angiographic images of the neck were obtained using MRA technique without and with intravenous contrast.  Carotid stenosis measurements (when applicable) are obtained utilizing NASCET criteria, using the distal internal carotid diameter as the denominator.  Contrast: 13mL MULTIHANCE GADOBENATE DIMEGLUMINE 529 MG/ML IV SOLN  Comparison:  CT 03/26/2013.  MRI 02/17/2013.  MR angiogram 12/05/2012  MRI HEAD  Findings:  The patient had an acute right MCA infarct on  02/17/2013, involving the insula and posterior frontal lobe.  Some of this area continues to show increased signal on diffusion weighted imaging.  Some this is most likely T2 shine through, however there also is superimposed acute infarction.  There appears to be extension of infarct now involving the head of the caudate and right medial thalamus.  These areas do not show infarction on the prior study.  There may also be some additional acute infarct in the right posterior frontal cortex. No acute infarct is seen on the left or the posterior fossa.  Ventricle size is normal.  No shift of midline structures.  No intracranial hemorrhage is identified.  Postcontrast imaging reveals enhancement of the infarct in the right lateral basal ganglia, insula, and right frontal lobe.  This suggests  a subacute element to the infarction.  The patient has occluded right MCA and some of the enhancement may be leptomeningeal collateral circulation as well as some luxury perfusion of subacute infarcted tissue.  Marland Kitchen  IMPRESSION: This patient has had a recent right MCA infarct.  There appears to be extension of acute infarct in the head of the caudate and right medial thalamus.  There may also be some extension of infarction within the insula and right frontal lobe.  MRA HEAD  Findings: The right vertebral artery is widely patent to the basilar.  The proximal left vertebral artery is occluded with some reconstitution and  faint flow in the distal left vertebral artery which is severely diseased.  The basilar is widely patent. Posterior communicating arteries are patent bilaterally.  The posterior cerebral artery is patent bilaterally but is irregular due to diffuse disease.  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  The cervical internal carotid artery on the right is patent.  The cavernous carotid artery is patent.  There is occlusion of the supraclinoid internal carotid artery on the right, which is new since the last study.  The right middle cerebral artery has minimal if any flow.  On source images there may be some trickle flow in the right middle cerebral artery via collaterals.  The cavernous carotid on the left is occluded.  The supraclinoid internal carotid artery on the left is supplied via the posterior communicating artery.  This supplies the left middle cerebral artery which is patent but appears to be diffusely diseased.  This is also supplying both anterior cerebral arteries.  IMPRESSION: Occluded left internal carotid artery.  Occluded left vertebral artery with distal reconstitution and minimal flow in the distal segment.  Right vertebral artery is patent.  The supraclinoid internal carotid artery on the right is occluded, new since the last study.  The anterior circulation appears to be only perfused via the left posterior communicating artery.  Diffuse intracranial vascular irregularity suggestive of arteritis.  MRA NECK  Findings: The innominate artery is patent.  Proximal right subclavian artery is patent.  Right common carotid artery is widely patent.  The right carotid bifurcation is widely patent.  The cervical internal carotid artery is patent.  There is occlusion of the supraclinoid right internal carotid artery as above.  Left common carotid artery is occluded at the origin.  No flow is seen in the left internal carotid artery.  Left subclavian artery  is occluded at the origin. There is reconstitution of the distal left vertebral   artery via collaterals with a small amount of flow in the distal left vertebral artery.  The right vertebral artery  is patent without significant stenosis.  IMPRESSION: Right carotid bifurcation is widely patent.  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  Occluded left common carotid, left internal carotid, and left external carotid arteries.  Occluded left vertebral artery with faint reconstitution of the distal left vertebral artery.  Left subclavian artery is patent.  Right vertebral artery is patent without significant stenosis.   Original Report Authenticated By: Janeece Riggers, M.D.    Mr [facility] Wo Contrast  03/27/2013  *RADIOLOGY REPORT*  Clinical Data:  Sudden loss of vision left eye.  History of recent stroke.  Takayasu arteritis.  MRI HEAD WITHOUT AND WITH CONTRAST MRA HEAD WITHOUT CONTRAST MRA NECK WITHOUT AND WITH CONTRAST  Technique:  Multiplanar, multiecho pulse sequences of the brain and surrounding structures were obtained without and with intravenous contrast.  Angiographic images of the Circle of Willis were obtained using MRA technique without intravenous contrast. Angiographic images of the neck were obtained using MRA technique without and with intravenous contrast.  Carotid stenosis measurements (when applicable) are obtained utilizing NASCET criteria, using the distal internal carotid diameter as the denominator.  Contrast: 13mL MULTIHANCE GADOBENATE DIMEGLUMINE 529 MG/ML IV SOLN  Comparison:  CT 03/26/2013.  MRI 02/17/2013.  MR angiogram 12/05/2012  MRI HEAD  Findings:  The patient had an acute right MCA infarct on 02/17/2013, involving the insula and posterior frontal lobe.  Some of this area continues to show increased signal on diffusion weighted imaging.  Some this is most likely T2 shine through, however there also is superimposed acute infarction.  There appears to be extension  of infarct now involving the head of the caudate and right medial thalamus.  These areas do not show infarction on the prior study.  There may also be some additional acute infarct in the right posterior frontal cortex. No acute infarct is seen on the left or the posterior fossa.  Ventricle size is normal.  No shift of midline structures.  No intracranial hemorrhage is identified.  Postcontrast imaging reveals enhancement of the infarct in the right lateral basal ganglia, insula, and right frontal lobe.  This suggests  a subacute element to the infarction.  The patient has occluded right MCA and some of the enhancement may be leptomeningeal collateral circulation as well as some luxury perfusion of subacute infarcted tissue.  Marland Kitchen  IMPRESSION: This patient has had a recent right MCA infarct.  There appears to be extension of acute infarct in the head of the caudate and right medial thalamus.  There may also be some extension of infarction within the insula and right frontal lobe.  MRA HEAD  Findings: The right vertebral artery is widely patent to the basilar.  The proximal left vertebral artery is occluded with some reconstitution and faint flow in the distal left vertebral artery which is severely diseased.  The basilar is widely patent. Posterior communicating arteries are patent bilaterally.  The posterior cerebral artery is patent bilaterally but is irregular due to diffuse disease.  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  The cervical internal carotid artery on the right is patent.  The cavernous carotid artery is patent.  There is occlusion of the supraclinoid internal carotid artery on the right, which is new since the last study.  The right middle cerebral artery has minimal if any flow.  On source images there may be some trickle flow in the right middle cerebral artery via collaterals.  The  cavernous carotid on the left is occluded.  The supraclinoid  internal carotid artery on the left is supplied via the posterior communicating artery.  This supplies the left middle cerebral artery which is patent but appears to be diffusely diseased.  This is also supplying both anterior cerebral arteries.  IMPRESSION: Occluded left internal carotid artery.  Occluded left vertebral artery with distal reconstitution and minimal flow in the distal segment.  Right vertebral artery is patent.  The supraclinoid internal carotid artery on the right is occluded, new since the last study.  The anterior circulation appears to be only perfused via the left posterior communicating artery.  Diffuse intracranial vascular irregularity suggestive of arteritis.  MRA NECK  Findings: The innominate artery is patent.  Proximal right subclavian artery is patent.  Right common carotid artery is widely patent.  The right carotid bifurcation is widely patent.  The cervical internal carotid artery is patent.  There is occlusion of the supraclinoid right internal carotid artery as above.  Left common carotid artery is occluded at the origin.  No flow is seen in the left internal carotid artery.  Left subclavian artery is occluded at the origin. There is reconstitution of the distal left vertebral   artery via collaterals with a small amount of flow in the distal left vertebral artery.  The right vertebral artery is patent without significant stenosis.  IMPRESSION: Right carotid bifurcation is widely patent.  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  Occluded left common carotid, left internal carotid, and left external carotid arteries.  Occluded left vertebral artery with faint reconstitution of the distal left vertebral artery.  Left subclavian artery is patent.  Right vertebral artery is patent without significant stenosis.   Original Report Authenticated By: Janeece Riggers, M.D.     Scheduled Meds: . coumadin book  1 each Does not apply Once  . enoxaparin (LOVENOX)  injection  1 mg/kg Subcutaneous Q12H  . methylphenidate  5 mg Oral BID WC  . predniSONE  20 mg Oral Q breakfast  . temazepam  7.5 mg Oral QHS  . warfarin  1 each Does not apply Once  . Warfarin - Pharmacist Dosing Inpatient   Does not apply q1800   Continuous Infusions: . sodium chloride 20 mL/hr (03/27/13 0533)     Marinda Elk  [facility] Pager [phone]. If 8PM-8AM, please contact night-coverage at [email or web address], password [facility] 03/28/2013, 8:05 AM  LOS: 2 days

## 2013-03-28 NOTE — Progress Notes (Signed)
ANTICOAGULATION CONSULT NOTE - Follow Up Consult  Pharmacy Consult for Lovenox, Coumadin  Indication: H/o lupus anticoagulant, DVT, stroke   Allergies  Allergen Reactions  . Pork-Derived Products     Patient Measurements: Height: 5\' 8"  (172.7 cm) Weight: 145 lb 11.6 oz (66.1 kg) IBW/kg (Calculated) : 63.9  Vital Signs: Temp: 97.9 F (36.6 C) (05/03 0800) Temp src: Oral (05/03 0800) BP: 136/79 mmHg (05/03 0800) Pulse Rate: 96 (05/03 0800)  Labs:  Recent Labs  03/26/13 2131 03/27/13 0630 03/28/13 0445  HGB 9.9* 8.8*  --   HCT 30.7* 27.9*  --   PLT 459* 416*  --   APTT 39*  --   --   LABPROT 15.9*  --  15.8*  INR 1.30  --  1.29  CREATININE 0.81 0.70  --   TROPONINI <0.30  --   --     Estimated Creatinine Clearance: 96.2 ml/min (by C-G formula based on Cr of 0.7).   Medications:  Scheduled:  . coumadin book  1 each Does not apply Once  . enoxaparin (LOVENOX) injection  1 mg/kg Subcutaneous Q12H  . ferrous sulfate  325 mg Oral TID WC  . methylphenidate  5 mg Oral BID WC  . predniSONE  20 mg Oral Q breakfast  . temazepam  7.5 mg Oral QHS  . [COMPLETED] warfarin  7.5 mg Oral ONCE-1800  . warfarin  1 each Does not apply Once  . 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 Patient was previously on rivaroxaban with last dose 5/1 at 8AM per patient report. Pharmacy to manage Lovenox and Coumadin. Confirmed with Dr. Amada Jupiter requested dose of Lovenox to be therapeutic anticoagulation (1mg /kg Q12H)   INR today is 1.29.  Coumadin day 2   Goal of Therapy:  INR 2-3 Monitor platelets by anticoagulation protocol: Yes   Plan:  -Continue coumadin 7.5mg  today -Daily PT/INR  Harland German, Pharm D 03/28/2013 12:10 PM

## 2013-03-28 NOTE — Evaluation (Signed)
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  Pt appears to be back to baseline with moiblity with L AFO and hemiwalker. Recommend d/c to home with [facility] therapies as pior to admission.  Pt would also benefit from the addition of a HHaide if possible.     PT Assessment  All further PT needs can be met in the next venue of care    Follow Up Recommendations  Home health PT [facility])    Does the patient have the potential to tolerate intense rehabilitation      Barriers to Discharge None      Equipment Recommendations  None recommended by PT    Recommendations for Other Services     Frequency Min 4X/week    Precautions / Restrictions Precautions Precautions: Fall Precaution Comments:  left hemiparesis especially in UE Restrictions Weight Bearing Restrictions: No   Pertinent Vitals/Pain No c/o pain      Mobility  Bed Mobility Rolling Right: 5: Supervision Rolling Left: 5: Supervision Right Sidelying to Sit: 5: Supervision Supine to Sit: 5: Supervision Sitting - Scoot to Edge of Bed: 5: Supervision Sit to Supine: 5: Supervision Details for Bed Mobility Assistance: Pt mvoes to right side and uses right arm to assist LUE and LLE Transfers Transfers: Sit to Stand;Stand to Sit Sit to Stand: 5: Supervision Stand Pivot Transfers: 5: Supervision Details for Transfer Assistance: Pt with good midline control and no loss of balance noted Ambulation/Gait Ambulation/Gait Assistance: 5: Supervision Ambulation Distance (Feet): 100 Feet Assistive device: Hemi-walker Gait Pattern: Left genu recurvatum;Decreased weight shift to left;Decreased hip/knee flexion - left;Decreased step  length - left;Lateral hip instability;Left flexed knee in stance;Step-to pattern Gait velocity: decreased General Gait Details: pt using AFO in left shoe to contol knee recurvatum and leans on hemiwalker in right UE to support  Pt takes extra time for all transitional and ambulation activities Stairs: No (mom states pt with no problem on steps) Wheelchair Mobility Wheelchair Mobility: No Modified Rankin (Stroke Patients Only) Pre-Morbid Rankin Score: Moderately severe disability Modified Rankin: Moderately severe disability    Exercises     PT Diagnosis: Difficulty walking;Hemiplegia non-dominant side;Abnormality of gait;Generalized weakness  PT Problem List: Decreased strength;Decreased range of motion;Decreased activity tolerance;Decreased balance;Decreased mobility;Decreased coordination;Decreased cognition;Decreased safety awareness PT Treatment Interventions: DME instruction;Gait training;Stair training;Functional mobility training;Therapeutic activities;Therapeutic exercise;Patient/family education   PT Goals Acute Rehab PT Goals PT Goal Formulation: With patient Time For Goal Achievement: 04/11/13 Pt will go Supine/Side to Sit: with modified independence PT Goal: Supine/Side to Sit - Progress: Goal set today Pt will Sit at [facility] of Bed: with modified independence PT Goal: Sit at Edge Of Bed - Progress: Goal set today Pt will go Sit to Supine/Side: with modified independence PT Goal: Sit to Supine/Side - Progress: Goal set today Pt will go Sit to Stand: with modified independence PT Goal: Sit to Stand - Progress: Goal set today Pt will go Stand to Sit: with modified independence PT Goal: Stand to Sit - Progress: Goal set today Pt will Ambulate: >150 feet;with modified independence PT Goal: Ambulate - Progress: Goal set today Pt will Go Up / Down Stairs: 3-5 stairs;with supervision PT Goal: Up/Down Stairs - Progress: Goal set today  Visit Information  Last PT Received On:  03/28/13    Subjective Data  Subjective: Pt is concerned about her  medicine and why it is not working Patient Stated Goal:  to  get some assist at home . 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, retired Arts development officer) Musician: No difficulties (decreased facial expression)    Cognition       Extremity/Trunk Assessment Right Lower Extremity Assessment RLE ROM/Strength/Tone: Within functional levels RLE Sensation: WFL - Light Touch;WFL - Proprioception Left Lower Extremity Assessment LLE ROM/Strength/Tone: Deficits LLE ROM/Strength/Tone Deficits: Pt has active movement in all groups, but weakness througout LLE Sensation: WFL - Light Touch;WFL - Proprioception   Balance Static Sitting Balance Static Sitting - Level of Assistance: 7: Independent Dynamic Sitting Balance Dynamic Sitting - Level of Assistance: 5: Stand by assistance Static Standing Balance Static Standing - Level of Assistance: 6: Modified independent (Device/Increase time) Dynamic Standing Balance Dynamic Standing - Level of Assistance:  (with reaching to ankles for pants)  End of Session    GP     Donnetta Hail 03/28/2013, 9:51 AM

## 2013-03-28 NOTE — Progress Notes (Signed)
Pt wanted RN to make note for pharmacy that her doctor who would monitor her coumadin level is Dr Dareen Piano off of [address]. Neelam Tiggs, [place] Marie, RN

## 2013-03-28 NOTE — Progress Notes (Signed)
Stroke Team Progress Note  HISTORY [name] is a 37 y.o. female who was in her normal state of health and was walking earlier on 03/26/2013 when she suddenly lost vision in her left eye. Her mother states that she covered each eye and the patient was able to see normally out of the right eye, but the left eye was completely black. She states that she looked in the mirror to make sure that her eye was open. Since that time, she has had return of vision, though not completely back to normal.  Of note she was recently discharged following an admission for stroke. She underwent revascularization interventional radiology, but had persistent left hemiparesis. She has a history of Takayasu's arteritis. She has lupus anticoagulant positivity, but I'm not sure about the diagnosis of lupus itself Patient was not a TPA candidate secondary to stroke within 90 days. She was admitted for further evaluation and treatment.  SUBJECTIVE There no family members present this morning. The patient just woke up. She is without complaints. She has had no further episodes of visual disturbances.  OBJECTIVE Most recent Vital Signs: Filed Vitals:   03/27/13 1346 03/27/13 1739 03/28/13 0132 03/28/13 0524  BP: 120/67 120/65 108/67 116/71  Pulse: 112 110 78 99  Temp: 99.1 F (37.3 C) 98.8 F (37.1 C) 97.6 F (36.4 C) 97.9 F (36.6 C)  TempSrc: Oral Oral Oral Oral  Resp: 18 18 16 16   Height:      Weight:      SpO2: 100% 94% 100% 99%   CBG (last 3)   Recent Labs  03/26/13 2134 03/27/13 1123 03/27/13 1701  GLUCAP 98 93 121*    IV Fluid Intake:   . sodium chloride 20 mL/hr (03/27/13 0533)    MEDICATIONS  . coumadin book  1 each Does not apply Once  . enoxaparin (LOVENOX) injection  1 mg/kg Subcutaneous Q12H  . methylphenidate  5 mg Oral BID WC  . predniSONE  20 mg Oral Q breakfast  . temazepam  7.5 mg Oral QHS  . warfarin  1 each Does not apply Once  . Warfarin - Pharmacist Dosing Inpatient   Does not  apply q1800   PRN:  traMADol  Diet:  Cardiac thin liquids Activity:   Bathroom privileges with assistance DVT Prophylaxis:  Full dose lovenox, coumadin  CLINICALLY SIGNIFICANT STUDIES Basic Metabolic Panel:   Recent Labs Lab 03/26/13 2131 03/27/13 0630  NA 136 137  K 3.6 3.4*  CL 100 102  CO2 23 26  GLUCOSE 95 107*  BUN 7 6  CREATININE 0.81 0.70  CALCIUM 9.4 9.0   Liver Function Tests:   Recent Labs Lab 03/26/13 2131 03/27/13 0630  AST 18 15  ALT 13 11  ALKPHOS 58 52  BILITOT 0.2* 0.3  PROT 8.3 7.2  ALBUMIN 3.2* 2.8*   CBC:   Recent Labs Lab 03/26/13 2131 03/27/13 0630  WBC 11.8* 10.8*  NEUTROABS 8.7* 8.3*  HGB 9.9* 8.8*  HCT 30.7* 27.9*  MCV 76.4* 77.3*  PLT 459* 416*   Coagulation:   Recent Labs Lab 03/26/13 2131 03/28/13 0445  LABPROT 15.9* 15.8*  INR 1.30 1.29   Cardiac Enzymes:   Recent Labs Lab 03/26/13 2131  TROPONINI <0.30   Urinalysis: No results found for this basename: COLORURINE, APPERANCEUR, LABSPEC, PHURINE, GLUCOSEU, HGBUR, BILIRUBINUR, KETONESUR, PROTEINUR, UROBILINOGEN, NITRITE, LEUKOCYTESUR,  in the last 168 hours Lipid Panel    Component Value Date/Time   CHOL 151 03/27/2013 0630  TRIG 61 03/27/2013 0630   HDL 60 03/27/2013 0630   CHOLHDL 2.5 03/27/2013 0630   VLDL 12 03/27/2013 0630   LDLCALC 79 03/27/2013 0630   HgbA1C  Lab Results  Component Value Date   HGBA1C 5.6 03/27/2013    Urine Drug Screen:     Component Value Date/Time   LABOPIA NONE DETECTED 03/27/2013 0247   COCAINSCRNUR NONE DETECTED 03/27/2013 0247   LABBENZ NONE DETECTED 03/27/2013 0247   AMPHETMU NONE DETECTED 03/27/2013 0247   THCU NONE DETECTED 03/27/2013 0247   LABBARB NONE DETECTED 03/27/2013 0247    Alcohol Level: No results found for this basename: ETH,  in the last 168 hours  CT of the brain  03/26/2013   No evidence of acute intracranial abnormality.  Encephalomalacic changes related to prior right MCA distribution infarct.    MRI of the brain  03/27/2013    This patient has had a recent right MCA infarct.  There appears to be extension of acute infarct in the head of the caudate and right medial thalamus.  There may also be some extension of infarction within the insula and right frontal lobe.    MRA of the brain 03/27/2013  Occluded left internal carotid artery.  Occluded left vertebral artery with distal reconstitution and minimal flow in the distal segment.  Right vertebral artery is patent.  The supraclinoid internal carotid artery on the right is occluded, new since the last study.  The anterior circulation appears to be only perfused via the left posterior communicating artery.  Diffuse intracranial vascular irregularity suggestive of arteritis.    MRA of the neck   03/27/2013  Right carotid bifurcation is widely patent.  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  Occluded left common carotid, left internal carotid, and left external carotid arteries.  Occluded left vertebral artery with faint reconstitution of the distal left vertebral artery.  Left subclavian artery is patent.  Right vertebral artery is patent without significant stenosis.  2D Echocardiogram  02/18/2013 EF 60% with no source of embolus.   Carotid Doppler  See MRA neck  EKG  sinus tachycardia.   Therapy Recommendations continue OP rehab following discharge  Physical Exam   Young African American lady currently not in distress. Awake.Afebrile. Head is nontraumatic. Neck is supple without bruit. Hearing is normal. Cardiac exam no murmur or gallop. Lungs are clear to auscultation. Distal pulses are well felt. Neurological Exam : Awake alert oriented x 3 normal speech and language. Mild left lower face asymmetry. Tongue midline. Dense left upper extremity plegia with 0/5 strength and diminished tone. Mild left lower extremity proximal and ankle dorsiflexor weakness 4/5.Marland Kitchen Diminished left hemibody sensation .gait deferred.  ASSESSMENT [name] is a 36  y.o. female presenting with acute left eye vision loss that lasted 20 mins and resolved. Imaging confirms no acute stroke. 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  On xarelto prior to admission (patient had refused coumadin at that time). Now changed to  warfarin and and full dose lovenox until INR therapeutic for secondary stroke prevention. Patient no new neuro symptoms, vision loss resolved.    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  HgbA1c 6.0 Anemia H/H - 8.8 / 27.9  Hospital day # 2  TREATMENT/PLAN  Continue full dose lovenox until INR therapeutic on  warfarin for secondary stroke prevention - INR 1.29 today  Follow anemia on coumadin - ? Etiology of anemia - may be from chronic disease. ? Work up. Will check the iron profile.  Out patient therapies to continue.  Hospitalists would like to know if pt could be discharged today. ? Home on lovenox until INR is therapeutic.   Delton See PA-C [facility] Pager [phone] 03/28/2013, 7:58 AM  I have personally obtained a history, examined the patient, evaluated imaging results, and formulated the assessment and plan of care. I agree with the above.  Lesly Dukes

## 2013-03-29 DIAGNOSIS — I10 Essential (primary) hypertension: Secondary | ICD-10-CM

## 2013-03-29 LAB — IRON AND TIBC: Saturation Ratios: 5 % — ABNORMAL LOW (ref 20–55)

## 2013-03-29 LAB — PROTIME-INR: Prothrombin Time: 17 seconds — ABNORMAL HIGH (ref 11.6–15.2)

## 2013-03-29 MED ORDER — FERROUS SULFATE 325 (65 FE) MG PO TABS: 325.0000 mg | ORAL_TABLET | Freq: Three times a day (TID) | ORAL | Status: DC

## 2013-03-29 MED ORDER — ENOXAPARIN SODIUM 100 MG/ML ~~LOC~~ SOLN: 100.0000 mg | Freq: Every day | SUBCUTANEOUS | Status: DC

## 2013-03-29 MED ORDER — METHYLPHENIDATE HCL 5 MG PO TABS: 5.0000 mg | ORAL_TABLET | Freq: Every day | ORAL | Status: DC

## 2013-03-29 MED ORDER — TEMAZEPAM 7.5 MG PO CAPS: 7.5000 mg | ORAL_CAPSULE | Freq: Every day | ORAL | Status: DC

## 2013-03-29 MED ORDER — WARFARIN SODIUM 5 MG PO TABS: 5.0000 mg | ORAL_TABLET | Freq: Every day | ORAL | Status: DC

## 2013-03-29 NOTE — Progress Notes (Signed)
Stroke Team Progress Note  HISTORY [name] is a 37 y.o. female who was in her normal state of health and was walking earlier on 03/26/2013 when she suddenly lost vision in her left eye. Her mother states that she covered each eye and the patient was able to see normally out of the right eye, but the left eye was completely black. She states that she looked in the mirror to make sure that her eye was open. Since that time, she has had return of vision, though not completely back to normal.  Of note she was recently discharged following an admission for stroke. She underwent revascularization interventional radiology, but had persistent left hemiparesis. She has a history of Takayasu's arteritis. She has lupus anticoagulant positivity, but I'm not sure about the diagnosis of lupus itself Patient was not a TPA candidate secondary to stroke within 90 days. She was admitted for further evaluation and treatment.  SUBJECTIVE There are no family members present this morning. The patient is much more alert today compared with yesterday. She denies further visual disturbances. She appears to be somewhat depressed about her medical condition; although, she is making good efforts to cope and understand the situation.  OBJECTIVE Most recent Vital Signs: Filed Vitals:   03/28/13 1842 03/28/13 2147 03/29/13 0200 03/29/13 0600  BP: 119/67 137/85 124/70 117/74  Pulse: 110 96 96 97  Temp: 98.4 F (36.9 C) 98.2 F (36.8 C) 97.8 F (36.6 C) 98 F (36.7 C)  TempSrc: Oral     Resp: 16 18 18 18   Height:      Weight:      SpO2: 99% 100% 98% 98%   CBG (last 3)   Recent Labs  03/26/13 2134 03/27/13 1123 03/27/13 1701  GLUCAP 98 93 121*    IV Fluid Intake:   . sodium chloride 20 mL/hr (03/27/13 0533)    MEDICATIONS  . coumadin book  1 each Does not apply Once  . enoxaparin (LOVENOX) injection  1 mg/kg Subcutaneous Q12H  . ferrous sulfate  325 mg Oral TID WC  . methylphenidate  5 mg Oral BID WC  .  predniSONE  20 mg Oral Q breakfast  . temazepam  7.5 mg Oral QHS  . warfarin  1 each Does not apply Once  . Warfarin - Pharmacist Dosing Inpatient   Does not apply q1800   PRN:  polyethylene glycol, traMADol  Diet:  Cardiac thin liquids Activity:   Bathroom privileges with assistance DVT Prophylaxis:  Full dose lovenox, coumadin  CLINICALLY SIGNIFICANT STUDIES Basic Metabolic Panel:   Recent Labs Lab 03/26/13 2131 03/27/13 0630  NA 136 137  K 3.6 3.4*  CL 100 102  CO2 23 26  GLUCOSE 95 107*  BUN 7 6  CREATININE 0.81 0.70  CALCIUM 9.4 9.0   Liver Function Tests:   Recent Labs Lab 03/26/13 2131 03/27/13 0630  AST 18 15  ALT 13 11  ALKPHOS 58 52  BILITOT 0.2* 0.3  PROT 8.3 7.2  ALBUMIN 3.2* 2.8*   CBC:   Recent Labs Lab 03/26/13 2131 03/27/13 0630  WBC 11.8* 10.8*  NEUTROABS 8.7* 8.3*  HGB 9.9* 8.8*  HCT 30.7* 27.9*  MCV 76.4* 77.3*  PLT 459* 416*   Coagulation:   Recent Labs Lab 03/26/13 2131 03/28/13 0445 03/29/13 0505  LABPROT 15.9* 15.8* 17.0*  INR 1.30 1.29 1.42   Cardiac Enzymes:   Recent Labs Lab 03/26/13 2131  TROPONINI <0.30   Urinalysis: No results found for  this basename: COLORURINE, APPERANCEUR, LABSPEC, PHURINE, GLUCOSEU, HGBUR, BILIRUBINUR, KETONESUR, PROTEINUR, UROBILINOGEN, NITRITE, LEUKOCYTESUR,  in the last 168 hours Lipid Panel    Component Value Date/Time   CHOL 151 03/27/2013 0630   TRIG 61 03/27/2013 0630   HDL 60 03/27/2013 0630   CHOLHDL 2.5 03/27/2013 0630   VLDL 12 03/27/2013 0630   LDLCALC 79 03/27/2013 0630   HgbA1C  Lab Results  Component Value Date   HGBA1C 5.6 03/27/2013    Urine Drug Screen:     Component Value Date/Time   LABOPIA NONE DETECTED 03/27/2013 0247   COCAINSCRNUR NONE DETECTED 03/27/2013 0247   LABBENZ NONE DETECTED 03/27/2013 0247   AMPHETMU NONE DETECTED 03/27/2013 0247   THCU NONE DETECTED 03/27/2013 0247   LABBARB NONE DETECTED 03/27/2013 0247    Alcohol Level: No results found for this basename: ETH,   in the last 168 hours  CT of the brain  03/26/2013   No evidence of acute intracranial abnormality.  Encephalomalacic changes related to prior right MCA distribution infarct.    MRI of the brain  03/27/2013   This patient has had a recent right MCA infarct.  There appears to be extension of acute infarct in the head of the caudate and right medial thalamus.  There may also be some extension of infarction within the insula and right frontal lobe.    MRA of the brain 03/27/2013  Occluded left internal carotid artery.  Occluded left vertebral artery with distal reconstitution and minimal flow in the distal segment.  Right vertebral artery is patent.  The supraclinoid internal carotid artery on the right is occluded, new since the last study.  The anterior circulation appears to be only perfused via the left posterior communicating artery.  Diffuse intracranial vascular irregularity suggestive of arteritis.    MRA of the neck   03/27/2013  Right carotid bifurcation is widely patent.  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  Occluded left common carotid, left internal carotid, and left external carotid arteries.  Occluded left vertebral artery with faint reconstitution of the distal left vertebral artery.  Left subclavian artery is patent.  Right vertebral artery is patent without significant stenosis.  2D Echocardiogram  02/18/2013 EF 60% with no source of embolus.   Carotid Doppler  See MRA neck  EKG  sinus tachycardia.   Therapy Recommendations continue OP rehab following discharge  Physical Exam   Young African American lady currently not in distress. Awake.Afebrile. Head is nontraumatic. Neck is supple without bruit. Hearing is normal. Cardiac - regular rate and rhythm with a soft murmur. Lungs are clear to auscultation. Distal pulses are well felt. Neurological Exam : Awake alert oriented x 3 normal speech and language. Mild left lower face asymmetry. Tongue midline. Dense  left upper extremity plegia with 0/5 strength and diminished tone. Mild left lower extremity proximal and ankle dorsiflexor weakness 4/5.Marland Kitchen Diminished left hemibody sensation .gait deferred.  ASSESSMENT [name] is a 37 y.o. female presenting with acute left eye vision loss that lasted 20 mins and resolved. Imaging confirms no acute stroke. Possible extension of previous right CVA.  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  On xarelto prior to admission (patient had refused coumadin at that time). Now changed to  warfarin and and full dose lovenox until INR therapeutic for secondary stroke prevention. Patient no new neuro symptoms, vision loss resolved.    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  HgbA1c 6.0 Anemia H/H - 8.8 / 27.9  Hospital day # 3  TREATMENT/PLAN  Continue full dose lovenox until INR therapeutic on  warfarin for secondary stroke prevention - INR 1.42 today.  Follow anemia on coumadin - ? Etiology of anemia - may be from chronic disease. ? Work up. Iron profile - pending  Out patient therapies to continue.  Hospitalists would like to know if pt could be discharged today.  Home on lovenox until INR is therapeutic.   Delton See PA-C [facility] Pager [phone] 03/29/2013, 7:57 AM  I have personally obtained a history, examined the patient, evaluated imaging results, and formulated the assessment and plan of care. I agree with the above.  Lesly Dukes

## 2013-03-29 NOTE — Discharge Summary (Signed)
Physician Discharge Summary  [name] VHQ:[id number] DOB: 1975/12/23 DOA: 03/26/2013  PCP: No PCP Per Patient  Admit date: 03/26/2013 Discharge date: 03/29/2013  Time spent: 40 minutes  Recommendations for Outpatient Follow-up:  1. Follow up with [facility]. Appointment next week. Check INR 2. Follow up with PCP titrate down ritalin. 3. Tapered down steroids.  Discharge Diagnoses:  Principal Problem:   Vision loss of left eye Active Problems:   TAKAYASU'S ARTERITIS   THROMBOSIS, VENOUS   CVA (cerebral infarction)   Discharge Condition: stable  Diet recommendation: regular  Filed Weights   03/27/13 0408 03/27/13 0455  Weight: 65.5 kg (144 lb 6.4 oz) 66.1 kg (145 lb 11.6 oz)    History of present illness:  37 y.o. female history of Takayasu's arteritis and recent admission last March 6 weeks ago for acute CVA with left-sided hemiparesis started experiencing left-sided visual loss last evening. It lasted for almost half an hour after which patient started regaining admission. She was brought to the ER and on-call Neurologist Dr. Amada Jupiter has already seen and evaluated and at this time feels that patient may be having embolic event to her retinal artery. Patient is on xarelto. Patient otherwise denies any new focal deficits denies any headache nausea vomiting. CT head was negative for any acute. MRI and MRA of the brain has been done and is pending. Patient will be admitted for further management. At this time neurologist has recommended to change the tube to Coumadin with Lovenox bridging. Patient has hypercoagulability with possible lupus anticoagulant disease. Patient has had previous history of DVT. Patient has been noncompliant with her prednisone for Takayasu's arteritis.   Hospital Course:  Vision loss of left eye/CVA (cerebral infarction):  - initially on xarelto. It was stopped and lovenox started. - MRI head: right MCA infarct, change to lovenox and coumadin.  She  while cont overlap at home for 5 days, follow up INR as an outpatient. - Pt evaluation recommended Home health PT.  TAKAYASU'S ARTERITIS  - steroids.  - + lupus anticoagulant in the past, has a rheumatologist [facility]) who follows her as an outpatient.   Deep vein thrombosis w/ IVC filter placement Jan 2014  - Initially on xarelto.  - now on lovenox and coumadin.   Anemia:  - stable at 8.8 now sign of overt bleeding.  - menstruating female. MCV 77.  - iron therapy.   Procedures:  MRI  Consultations:  Neuro  Discharge Exam: Filed Vitals:   03/28/13 1842 03/28/13 2147 03/29/13 0200 03/29/13 0600  BP: 119/67 137/85 124/70 117/74  Pulse: 110 96 96 97  Temp: 98.4 F (36.9 C) 98.2 F (36.8 C) 97.8 F (36.6 C) 98 F (36.7 C)  TempSrc: Oral     Resp: 16 18 18 18   Height:      Weight:      SpO2: 99% 100% 98% 98%    General: A&O x3 Cardiovascular: RRR Respiratory: good air movement CTA B/L  Discharge Instructions      Discharge Orders   Future Appointments Provider Department Dept Phone   04/10/2013 11:00 AM Erick Colace, [phone]   Future Orders Complete By Expires     Diet - low sodium heart healthy  As directed     Increase activity slowly  As directed         Medication List    STOP taking these medications       Rivaroxaban 20 MG Tabs  Commonly known as:  XARELTO     zolpidem 10 MG tablet  Commonly known as:  AMBIEN      TAKE these medications       enoxaparin 100 MG/ML injection  Commonly known as:  LOVENOX  Inject 1 mL (100 mg total) into the skin daily.     ferrous sulfate 325 (65 FE) MG tablet  Take 1 tablet (325 mg total) by mouth 3 (three) times daily with meals.     methylphenidate 5 MG tablet  Commonly known as:  RITALIN  Take 1 tablet (5 mg total) by mouth daily with breakfast.     multivitamin with minerals Tabs  Take 1 tablet by mouth daily.     predniSONE 20  MG tablet  Commonly known as:  DELTASONE  Take 20 mg by mouth daily.     temazepam 7.5 MG capsule  Commonly known as:  RESTORIL  Take 1 capsule (7.5 mg total) by mouth at bedtime.     traMADol 50 MG tablet  Commonly known as:  ULTRAM  Take 1 tablet (50 mg total) by mouth every 6 (six) hours as needed.     warfarin 5 MG tablet  Commonly known as:  COUMADIN  Take 1 tablet (5 mg total) by mouth daily.       Allergies  Allergen Reactions  . Pork-Derived Products       The results of significant diagnostics from this hospitalization (including imaging, microbiology, ancillary and laboratory) are listed below for reference.    Significant Diagnostic Studies: Ct Head Wo Contrast  03/26/2013  *RADIOLOGY REPORT*  Clinical Data: Loss of vision in the left eye, history of stroke with residual left-sided defect  CT HEAD WITHOUT CONTRAST  Technique:  Contiguous axial images were obtained from the base of the skull through the vertex without contrast.  Comparison: 02/21/2013  Findings: No CT evidence of acute infarction.  Encephalomalacic changes related to prior right frontal lobe and basal ganglia infarct.  Prior midline shift has resolved.  No mass lesion or mass effect.  No evidence of parenchymal hemorrhage or extra-axial fluid collection.  Cerebral volume is age appropriate.  No ventriculomegaly.  The visualized paranasal sinuses are essentially clear. The mastoid air cells are unopacified.  No evidence of calvarial fracture.  IMPRESSION: No evidence of acute intracranial abnormality.  Encephalomalacic changes related to prior right MCA distribution infarct.   Original Report Authenticated By: Charline Bills, M.D.    Mr [facility] Wo Contrast  03/27/2013  *RADIOLOGY REPORT*  Clinical Data:  Sudden loss of vision left eye.  History of recent stroke.  Takayasu arteritis.  MRI HEAD WITHOUT AND WITH CONTRAST MRA HEAD WITHOUT CONTRAST MRA NECK WITHOUT AND WITH CONTRAST  Technique:  Multiplanar,  multiecho pulse sequences of the brain and surrounding structures were obtained without and with intravenous contrast.  Angiographic images of the Circle of Willis were obtained using MRA technique without intravenous contrast. Angiographic images of the neck were obtained using MRA technique without and with intravenous contrast.  Carotid stenosis measurements (when applicable) are obtained utilizing NASCET criteria, using the distal internal carotid diameter as the denominator.  Contrast: 13mL MULTIHANCE GADOBENATE DIMEGLUMINE 529 MG/ML IV SOLN  Comparison:  CT 03/26/2013.  MRI 02/17/2013.  MR angiogram 12/05/2012  MRI HEAD  Findings:  The patient had an acute right MCA infarct on 02/17/2013, involving the insula and posterior frontal lobe.  Some of this area continues to show increased signal on diffusion weighted imaging.  Some  this is most likely T2 shine through, however there also is superimposed acute infarction.  There appears to be extension of infarct now involving the head of the caudate and right medial thalamus.  These areas do not show infarction on the prior study.  There may also be some additional acute infarct in the right posterior frontal cortex. No acute infarct is seen on the left or the posterior fossa.  Ventricle size is normal.  No shift of midline structures.  No intracranial hemorrhage is identified.  Postcontrast imaging reveals enhancement of the infarct in the right lateral basal ganglia, insula, and right frontal lobe.  This suggests  a subacute element to the infarction.  The patient has occluded right MCA and some of the enhancement may be leptomeningeal collateral circulation as well as some luxury perfusion of subacute infarcted tissue.  Marland Kitchen  IMPRESSION: This patient has had a recent right MCA infarct.  There appears to be extension of acute infarct in the head of the caudate and right medial thalamus.  There may also be some extension of infarction within the insula and right  frontal lobe.  MRA HEAD  Findings: The right vertebral artery is widely patent to the basilar.  The proximal left vertebral artery is occluded with some reconstitution and faint flow in the distal left vertebral artery which is severely diseased.  The basilar is widely patent. Posterior communicating arteries are patent bilaterally.  The posterior cerebral artery is patent bilaterally but is irregular due to diffuse disease.  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  The cervical internal carotid artery on the right is patent.  The cavernous carotid artery is patent.  There is occlusion of the supraclinoid internal carotid artery on the right, which is new since the last study.  The right middle cerebral artery has minimal if any flow.  On source images there may be some trickle flow in the right middle cerebral artery via collaterals.  The cavernous carotid on the left is occluded.  The supraclinoid internal carotid artery on the left is supplied via the posterior communicating artery.  This supplies the left middle cerebral artery which is patent but appears to be diffusely diseased.  This is also supplying both anterior cerebral arteries.  IMPRESSION: Occluded left internal carotid artery.  Occluded left vertebral artery with distal reconstitution and minimal flow in the distal segment.  Right vertebral artery is patent.  The supraclinoid internal carotid artery on the right is occluded, new since the last study.  The anterior circulation appears to be only perfused via the left posterior communicating artery.  Diffuse intracranial vascular irregularity suggestive of arteritis.  MRA NECK  Findings: The innominate artery is patent.  Proximal right subclavian artery is patent.  Right common carotid artery is widely patent.  The right carotid bifurcation is widely patent.  The cervical internal carotid artery is patent.  There is occlusion of the supraclinoid  right internal carotid artery as above.  Left common carotid artery is occluded at the origin.  No flow is seen in the left internal carotid artery.  Left subclavian artery is occluded at the origin. There is reconstitution of the distal left vertebral   artery via collaterals with a small amount of flow in the distal left vertebral artery.  The right vertebral artery is patent without significant stenosis.  IMPRESSION: Right carotid bifurcation is widely patent.  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  Occluded left common carotid, left internal carotid, and left external carotid arteries.  Occluded left vertebral artery with faint reconstitution of the distal left vertebral artery.  Left subclavian artery is patent.  Right vertebral artery is patent without significant stenosis.   Original Report Authenticated By: Janeece Riggers, M.D.    Mr Angiogram Neck W Wo Contrast  03/27/2013  *RADIOLOGY REPORT*  Clinical Data:  Sudden loss of vision left eye.  History of recent stroke.  Takayasu arteritis.  MRI HEAD WITHOUT AND WITH CONTRAST MRA HEAD WITHOUT CONTRAST MRA NECK WITHOUT AND WITH CONTRAST  Technique:  Multiplanar, multiecho pulse sequences of the brain and surrounding structures were obtained without and with intravenous contrast.  Angiographic images of the Circle of Willis were obtained using MRA technique without intravenous contrast. Angiographic images of the neck were obtained using MRA technique without and with intravenous contrast.  Carotid stenosis measurements (when applicable) are obtained utilizing NASCET criteria, using the distal internal carotid diameter as the denominator.  Contrast: 13mL MULTIHANCE GADOBENATE DIMEGLUMINE 529 MG/ML IV SOLN  Comparison:  CT 03/26/2013.  MRI 02/17/2013.  MR angiogram 12/05/2012  MRI HEAD  Findings:  The patient had an acute right MCA infarct on 02/17/2013, involving the insula and posterior frontal lobe.  Some of this area continues to  show increased signal on diffusion weighted imaging.  Some this is most likely T2 shine through, however there also is superimposed acute infarction.  There appears to be extension of infarct now involving the head of the caudate and right medial thalamus.  These areas do not show infarction on the prior study.  There may also be some additional acute infarct in the right posterior frontal cortex. No acute infarct is seen on the left or the posterior fossa.  Ventricle size is normal.  No shift of midline structures.  No intracranial hemorrhage is identified.  Postcontrast imaging reveals enhancement of the infarct in the right lateral basal ganglia, insula, and right frontal lobe.  This suggests  a subacute element to the infarction.  The patient has occluded right MCA and some of the enhancement may be leptomeningeal collateral circulation as well as some luxury perfusion of subacute infarcted tissue.  Marland Kitchen  IMPRESSION: This patient has had a recent right MCA infarct.  There appears to be extension of acute infarct in the head of the caudate and right medial thalamus.  There may also be some extension of infarction within the insula and right frontal lobe.  MRA HEAD  Findings: The right vertebral artery is widely patent to the basilar.  The proximal left vertebral artery is occluded with some reconstitution and faint flow in the distal left vertebral artery which is severely diseased.  The basilar is widely patent. Posterior communicating arteries are patent bilaterally.  The posterior cerebral artery is patent bilaterally but is irregular due to diffuse disease.  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  The cervical internal carotid artery on the right is patent.  The cavernous carotid artery is patent.  There is occlusion of the supraclinoid internal carotid artery on the right, which is new since the last study.  The right middle cerebral artery has minimal if  any flow.  On source images there may be some trickle flow in the right middle cerebral artery via collaterals.  The cavernous carotid on the left is occluded.  The supraclinoid internal carotid artery on the left is supplied via the posterior communicating artery.  This supplies the left middle cerebral artery which is patent but appears to be diffusely diseased.  This is also supplying both anterior cerebral arteries.  IMPRESSION: Occluded left internal carotid artery.  Occluded left vertebral artery with distal reconstitution and minimal flow in the distal segment.  Right vertebral artery is patent.  The supraclinoid internal carotid artery on the right is occluded, new since the last study.  The anterior circulation appears to be only perfused via the left posterior communicating artery.  Diffuse intracranial vascular irregularity suggestive of arteritis.  MRA NECK  Findings: The innominate artery is patent.  Proximal right subclavian artery is patent.  Right common carotid artery is widely patent.  The right carotid bifurcation is widely patent.  The cervical internal carotid artery is patent.  There is occlusion of the supraclinoid right internal carotid artery as above.  Left common carotid artery is occluded at the origin.  No flow is seen in the left internal carotid artery.  Left subclavian artery is occluded at the origin. There is reconstitution of the distal left vertebral   artery via collaterals with a small amount of flow in the distal left vertebral artery.  The right vertebral artery is patent without significant stenosis.  IMPRESSION: Right carotid bifurcation is widely patent.  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  Occluded left common carotid, left internal carotid, and left external carotid arteries.  Occluded left vertebral artery with faint reconstitution of the distal left vertebral artery.  Left subclavian artery is patent.  Right vertebral artery is patent  without significant stenosis.   Original Report Authenticated By: Janeece Riggers, M.D.    Mr [facility] Wo Contrast  03/27/2013  *RADIOLOGY REPORT*  Clinical Data:  Sudden loss of vision left eye.  History of recent stroke.  Takayasu arteritis.  MRI HEAD WITHOUT AND WITH CONTRAST MRA HEAD WITHOUT CONTRAST MRA NECK WITHOUT AND WITH CONTRAST  Technique:  Multiplanar, multiecho pulse sequences of the brain and surrounding structures were obtained without and with intravenous contrast.  Angiographic images of the Circle of Willis were obtained using MRA technique without intravenous contrast. Angiographic images of the neck were obtained using MRA technique without and with intravenous contrast.  Carotid stenosis measurements (when applicable) are obtained utilizing NASCET criteria, using the distal internal carotid diameter as the denominator.  Contrast: 13mL MULTIHANCE GADOBENATE DIMEGLUMINE 529 MG/ML IV SOLN  Comparison:  CT 03/26/2013.  MRI 02/17/2013.  MR angiogram 12/05/2012  MRI HEAD  Findings:  The patient had an acute right MCA infarct on 02/17/2013, involving the insula and posterior frontal lobe.  Some of this area continues to show increased signal on diffusion weighted imaging.  Some this is most likely T2 shine through, however there also is superimposed acute infarction.  There appears to be extension of infarct now involving the head of the caudate and right medial thalamus.  These areas do not show infarction on the prior study.  There may also be some additional acute infarct in the right posterior frontal cortex. No acute infarct is seen on the left or the posterior fossa.  Ventricle size is normal.  No shift of midline structures.  No intracranial hemorrhage is identified.  Postcontrast imaging reveals enhancement of the infarct in the right lateral basal ganglia, insula, and right frontal lobe.  This suggests  a subacute element to the infarction.  The patient has occluded right MCA and some of the  enhancement may be leptomeningeal collateral circulation as well as  some luxury perfusion of subacute infarcted tissue.  Marland Kitchen  IMPRESSION: This patient has had a recent right MCA infarct.  There appears to be extension of acute infarct in the head of the caudate and right medial thalamus.  There may also be some extension of infarction within the insula and right frontal lobe.  MRA HEAD  Findings: The right vertebral artery is widely patent to the basilar.  The proximal left vertebral artery is occluded with some reconstitution and faint flow in the distal left vertebral artery which is severely diseased.  The basilar is widely patent. Posterior communicating arteries are patent bilaterally.  The posterior cerebral artery is patent bilaterally but is irregular due to diffuse disease.  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  The cervical internal carotid artery on the right is patent.  The cavernous carotid artery is patent.  There is occlusion of the supraclinoid internal carotid artery on the right, which is new since the last study.  The right middle cerebral artery has minimal if any flow.  On source images there may be some trickle flow in the right middle cerebral artery via collaterals.  The cavernous carotid on the left is occluded.  The supraclinoid internal carotid artery on the left is supplied via the posterior communicating artery.  This supplies the left middle cerebral artery which is patent but appears to be diffusely diseased.  This is also supplying both anterior cerebral arteries.  IMPRESSION: Occluded left internal carotid artery.  Occluded left vertebral artery with distal reconstitution and minimal flow in the distal segment.  Right vertebral artery is patent.  The supraclinoid internal carotid artery on the right is occluded, new since the last study.  The anterior circulation appears to be only perfused via the left posterior communicating  artery.  Diffuse intracranial vascular irregularity suggestive of arteritis.  MRA NECK  Findings: The innominate artery is patent.  Proximal right subclavian artery is patent.  Right common carotid artery is widely patent.  The right carotid bifurcation is widely patent.  The cervical internal carotid artery is patent.  There is occlusion of the supraclinoid right internal carotid artery as above.  Left common carotid artery is occluded at the origin.  No flow is seen in the left internal carotid artery.  Left subclavian artery is occluded at the origin. There is reconstitution of the distal left vertebral   artery via collaterals with a small amount of flow in the distal left vertebral artery.  The right vertebral artery is patent without significant stenosis.  IMPRESSION: Right carotid bifurcation is widely patent.  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  Occluded left common carotid, left internal carotid, and left external carotid arteries.  Occluded left vertebral artery with faint reconstitution of the distal left vertebral artery.  Left subclavian artery is patent.  Right vertebral artery is patent without significant stenosis.   Original Report Authenticated By: Janeece Riggers, M.D.     Microbiology: Recent Results (from the past 240 hour(s))  MRSA PCR SCREENING     Status: None   Collection Time    03/27/13  5:45 AM      Result Value Range Status   MRSA by PCR NEGATIVE  NEGATIVE Final   Comment:            The GeneXpert MRSA Assay (FDA     approved for NASAL specimens     only), is one component of  a     comprehensive MRSA colonization     surveillance program. It is not     intended to diagnose MRSA     infection nor to guide or     monitor treatment for     MRSA infections.     Labs: Basic Metabolic Panel:  Recent Labs Lab 03/26/13 2131 03/27/13 0630  NA 136 137  K 3.6 3.4*  CL 100 102  CO2 23 26  GLUCOSE 95 107*  BUN 7 6  CREATININE 0.81 0.70   CALCIUM 9.4 9.0   Liver Function Tests:  Recent Labs Lab 03/26/13 2131 03/27/13 0630  AST 18 15  ALT 13 11  ALKPHOS 58 52  BILITOT 0.2* 0.3  PROT 8.3 7.2  ALBUMIN 3.2* 2.8*   No results found for this basename: LIPASE, AMYLASE,  in the last 168 hours No results found for this basename: AMMONIA,  in the last 168 hours CBC:  Recent Labs Lab 03/26/13 2131 03/27/13 0630  WBC 11.8* 10.8*  NEUTROABS 8.7* 8.3*  HGB 9.9* 8.8*  HCT 30.7* 27.9*  MCV 76.4* 77.3*  PLT 459* 416*   Cardiac Enzymes:  Recent Labs Lab 03/26/13 2131  TROPONINI <0.30   BNP: BNP (last 3 results) No results found for this basename: PROBNP,  in the last 8760 hours CBG:  Recent Labs Lab 03/26/13 2134 03/27/13 1123 03/27/13 1701  GLUCAP 98 93 121*    Signed:  FELIZ ORTIZ, ABRAHAM  [facility] 03/29/2013, 8:42 AM

## 2013-03-29 NOTE — Progress Notes (Signed)
   CARE MANAGEMENT NOTE 03/29/2013  Patient:  TANIS, HENSARLING   Account Number:  [id number]  Date Initiated:  03/27/2013  Documentation initiated by:  [facility]  Subjective/Objective Assessment:   admitted with CVA     Action/Plan:   Anticipated DC Date:  03/29/2013   Anticipated DC Plan:  HOME/SELF CARE      DC Planning Services  CM consult      [facility] Choice  HOME HEALTH   Choice offered to / List presented to:  C-1 Patient        HH arranged  HH-1 RN  HH-2 PT  HH-4 NURSE'S AIDE      HH agency  [facility]   Status of service:  Completed, signed off Medicare Important Message given?   (If response is "NO", the following Medicare IM given date fields will be blank) Date Medicare IM given:   Date Additional Medicare IM given:    Discharge Disposition:  HOME W HOME HEALTH SERVICES  Per UR Regulation:  Reviewed for med. necessity/level of care/duration of stay  If discussed at Long Length of Stay Meetings, dates discussed:    Comments:  03/29/2013 1000 NCM spoke to pt and states she has used [facility] in the past for [facility]. States she has wheelchair and bedside commode at home. She is requesting hospital bed. States she has stairs in her home and lives alone. Getting up and down stairs is difficult. Faxed referral to [facility] for [facility] RN, PT and aide with lab draw on Wed. Will have [facility] run benefit for hospital bed. Notified attending of pt's request for hospital bed.  [facility] has Lovenox in stock. Faxed Rx to pharmacy per pt request.  Isidoro Donning RN CCM Case Mgmt phone [phone]

## 2013-04-01 ENCOUNTER — Telehealth: Payer: Self-pay

## 2013-04-01 NOTE — Telephone Encounter (Signed)
PT 24.0, INR 2.0.  Patient is on 100mg  lovenox and 5 mg coumadin.  Please advise.

## 2013-04-02 NOTE — Telephone Encounter (Signed)
I am not following this.  Has been recently admitted to hospital and instructions were to f/u in coumadin clinic

## 2013-04-03 NOTE — Telephone Encounter (Signed)
Informe Rita to have patient follow up with coumadin clinic.

## 2013-04-08 ENCOUNTER — Telehealth: Payer: Self-pay | Admitting: General Practice

## 2013-04-08 ENCOUNTER — Telehealth: Payer: Self-pay | Admitting: *Deleted

## 2013-04-08 ENCOUNTER — Telehealth: Payer: Self-pay

## 2013-04-08 NOTE — Telephone Encounter (Signed)
Incoming call from [place], RN @ [facility] with INR on pt.  After some investigating found out that pt is established with [facility]Dr. Dareen Piano). Gave RN contact information and informed her that [facility] would be assisting in managing patient's INR.

## 2013-04-08 NOTE — Telephone Encounter (Signed)
Calling in PT/INR  5.7, 68.6.  I noitified her that we are not following and she needs to call the [facility].

## 2013-04-08 NOTE — Telephone Encounter (Signed)
Cindy with [facility] called regarding patient.  Patient was referred to them by home health and she is not an established patient there.  Patient is going to follow up with Dr Dareen Piano per Arline Asp.

## 2013-04-10 ENCOUNTER — Inpatient Hospital Stay: Payer: TRICARE For Life (TFL) | Admitting: Physical Medicine & Rehabilitation

## 2013-04-10 ENCOUNTER — Encounter: Payer: Medicare Other | Attending: Physical Medicine & Rehabilitation

## 2013-04-10 DIAGNOSIS — Z7901 Long term (current) use of anticoagulants: Secondary | ICD-10-CM | POA: Insufficient documentation

## 2013-04-10 DIAGNOSIS — I6350 Cerebral infarction due to unspecified occlusion or stenosis of unspecified cerebral artery: Secondary | ICD-10-CM | POA: Insufficient documentation

## 2013-04-10 DIAGNOSIS — I739 Peripheral vascular disease, unspecified: Secondary | ICD-10-CM | POA: Insufficient documentation

## 2013-04-10 DIAGNOSIS — Z8679 Personal history of other diseases of the circulatory system: Secondary | ICD-10-CM | POA: Insufficient documentation

## 2013-04-10 DIAGNOSIS — I82409 Acute embolism and thrombosis of unspecified deep veins of unspecified lower extremity: Secondary | ICD-10-CM | POA: Insufficient documentation

## 2013-04-15 ENCOUNTER — Telehealth: Payer: Self-pay

## 2013-04-15 DIAGNOSIS — I639 Cerebral infarction, unspecified: Secondary | ICD-10-CM

## 2013-04-15 NOTE — Telephone Encounter (Signed)
Revonda Standard a [facility] therapist with [facility] called to discharge from home therapy.  She says patient was very non-compliant and did not have much success with therapy.  Revonda Standard hopes patient will do better with out patient therapy.  She is requesting orders for outpatient physical therapy, speech therapy and occupational therapy.  Orders placed.

## 2013-04-16 ENCOUNTER — Ambulatory Visit: Payer: Medicare Other | Attending: Physical Medicine & Rehabilitation | Admitting: Occupational Therapy

## 2013-04-16 ENCOUNTER — Encounter: Payer: Self-pay | Admitting: Physical Medicine & Rehabilitation

## 2013-04-16 ENCOUNTER — Ambulatory Visit (HOSPITAL_BASED_OUTPATIENT_CLINIC_OR_DEPARTMENT_OTHER): Payer: Medicare Other | Admitting: Physical Medicine & Rehabilitation

## 2013-04-16 VITALS — BP 135/81 | HR 85 | Resp 14 | Ht 68.0 in | Wt 147.0 lb

## 2013-04-16 DIAGNOSIS — I639 Cerebral infarction, unspecified: Secondary | ICD-10-CM

## 2013-04-16 DIAGNOSIS — R209 Unspecified disturbances of skin sensation: Secondary | ICD-10-CM

## 2013-04-16 DIAGNOSIS — R41841 Cognitive communication deficit: Secondary | ICD-10-CM | POA: Insufficient documentation

## 2013-04-16 DIAGNOSIS — R4189 Other symptoms and signs involving cognitive functions and awareness: Secondary | ICD-10-CM | POA: Insufficient documentation

## 2013-04-16 DIAGNOSIS — I6350 Cerebral infarction due to unspecified occlusion or stenosis of unspecified cerebral artery: Secondary | ICD-10-CM

## 2013-04-16 DIAGNOSIS — G8110 Spastic hemiplegia affecting unspecified side: Secondary | ICD-10-CM

## 2013-04-16 DIAGNOSIS — M6281 Muscle weakness (generalized): Secondary | ICD-10-CM | POA: Insufficient documentation

## 2013-04-16 DIAGNOSIS — I69998 Other sequelae following unspecified cerebrovascular disease: Secondary | ICD-10-CM

## 2013-04-16 DIAGNOSIS — Z5189 Encounter for other specified aftercare: Secondary | ICD-10-CM | POA: Insufficient documentation

## 2013-04-16 NOTE — Progress Notes (Signed)
Subjective:    Patient ID: [name], female    DOB: 08/22/1976, 37 y.o.   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 Admitted  on 02/16/2013, with complaints of headache, left-sided weakness, and  altered mental status. INR on admission of 1.14 with noted poor medical  compliance, on Coumadin. MRI of the brain showed acute infarct right  PCA territory without hemorrhages as well as scattered small areas of  acute infarct left frontal and parietal lobe  HPI Patient was switched to Xarelto, discharged to home after inpatient rehabilitation stay but was readmitted to the hospital on 03/26/2013 for left visual complaints  Repeat MRI on 03-2013 This patient has had a recent right MCA infarct. There appears to  be extension of acute infarct in the head of the caudate and right  medial thalamus. Switched back to Coumadin. Finished up with home health. Now set up for outpatient therapy Pain Inventory Average Pain n/a Pain Right Now n/a My pain is n/a  In the last 24 hours, has pain interfered with the following? General activity 0 Relation with others 0 Enjoyment of life 0 What TIME of day is your pain at its worst? night Sleep (in general) Fair  Pain is worse with: unsure Pain improves with: n/a Relief from Meds: n/a  Mobility how many minutes can you walk? 2-3 ability to climb steps?  no use a wheelchair needs help with transfers  Function disabled: date disabled . I need assistance with the following:  household duties  Neuro/Psych anxiety  Prior Studies Any changes since last visit?  no  Physicians involved in your care Any changes since last visit?  no   Family History  Problem Relation Age of Onset  . Cancer - Other Father   . Coronary  artery disease Mother   . Stroke Mother   . Hypertension Mother   . Hypertension Sister    History   Social History  . Marital Status: Legally Separated    Spouse Name: N/A    Number of Children: N/A  . Years of Education: N/A   Social History Main Topics  . Smoking status: Never Smoker   . Smokeless tobacco: Never Used  . Alcohol Use: 3.0 oz/week    5 Glasses of wine per week  . Drug Use: No  . Sexually Active: None   Other Topics Concern  . None   Social History Narrative  . None   Past Surgical History  Procedure Laterality Date  . Ivc filter placement    . Breast surgery      bil breast implants   Past Medical History  Diagnosis Date  . PVD (peripheral vascular disease)   . Benign tumor of back   . Swelling, lymph nodes 2006    intermittant, benign  . DVT (deep venous thrombosis)   . Stroke     last week here   BP 135/81  Pulse 85  Resp 14  Ht 5\' 8"  (1.727 m)  Wt 147 lb (66.679 kg)  BMI 22.36 kg/m2  SpO2 96%  LMP 03/24/2013     Review of Systems  Psychiatric/Behavioral: The patient is nervous/anxious.   All other systems reviewed and are negative.       Objective:   Physical Exam  Nursing note  and vitals reviewed. Constitutional: She is oriented to person, place, and time. She appears well-developed and well-nourished.  HENT:  Head: Normocephalic and atraumatic.  Eyes: Conjunctivae and EOM are normal. Pupils are equal, round, and reactive to light.  Visual fields are intact to testing  Neck: Normal range of motion. Neck supple.  Musculoskeletal:       Left shoulder: She exhibits decreased range of motion and pain. She exhibits no tenderness.  Mild pain with shoulder range of motion  Neurological: She is alert and oriented to person, place, and time. A sensory deficit is present. She exhibits abnormal muscle tone. Coordination and gait abnormal.  Reflex Scores:      Tricep reflexes are 0 on the left side.      Bicep reflexes are 0 on the  left side.      Brachioradialis reflexes are 0 on the left side.      Patellar reflexes are 2+ on the left side. 0/5 left bicep, tricep, grip 2 minus left extension 3 minus knee extension trace ankle dorsiflexion plantarflexion  Psychiatric: Her affect is blunt. Her speech is delayed. She is slowed. Cognition and memory are impaired. She is inattentive.          Assessment & Plan:  1. Left MCA infarct in March with extension the beginning of this month. We'll switch from home health therapy to outpatient therapy. Followup physical medicine and rehabilitation one month. Medical followup with primary physician Followup with Coumadin clinic to maintain pro time INR 2 to 3

## 2013-04-22 ENCOUNTER — Ambulatory Visit: Payer: Medicare Other

## 2013-04-23 ENCOUNTER — Ambulatory Visit: Payer: Medicare Other | Admitting: Occupational Therapy

## 2013-04-27 ENCOUNTER — Ambulatory Visit: Payer: Medicare Other | Attending: Physical Medicine & Rehabilitation | Admitting: Physical Therapy

## 2013-04-27 DIAGNOSIS — R4189 Other symptoms and signs involving cognitive functions and awareness: Secondary | ICD-10-CM | POA: Insufficient documentation

## 2013-04-27 DIAGNOSIS — Z5189 Encounter for other specified aftercare: Secondary | ICD-10-CM | POA: Insufficient documentation

## 2013-04-27 DIAGNOSIS — R269 Unspecified abnormalities of gait and mobility: Secondary | ICD-10-CM | POA: Insufficient documentation

## 2013-04-27 DIAGNOSIS — I69959 Hemiplegia and hemiparesis following unspecified cerebrovascular disease affecting unspecified side: Secondary | ICD-10-CM | POA: Insufficient documentation

## 2013-04-27 DIAGNOSIS — I69919 Unspecified symptoms and signs involving cognitive functions following unspecified cerebrovascular disease: Secondary | ICD-10-CM | POA: Insufficient documentation

## 2013-04-27 DIAGNOSIS — M6281 Muscle weakness (generalized): Secondary | ICD-10-CM | POA: Insufficient documentation

## 2013-04-28 ENCOUNTER — Ambulatory Visit: Payer: Medicare Other | Admitting: Occupational Therapy

## 2013-05-04 ENCOUNTER — Ambulatory Visit: Payer: Medicare Other | Admitting: Occupational Therapy

## 2013-05-04 ENCOUNTER — Ambulatory Visit: Payer: Medicare Other | Admitting: Physical Therapy

## 2013-05-06 ENCOUNTER — Ambulatory Visit: Payer: Medicare Other | Admitting: Occupational Therapy

## 2013-05-06 ENCOUNTER — Ambulatory Visit: Payer: Medicare Other | Admitting: Physical Therapy

## 2013-05-08 ENCOUNTER — Ambulatory Visit: Payer: Medicare Other | Admitting: *Deleted

## 2013-05-08 ENCOUNTER — Ambulatory Visit: Payer: Medicare Other | Admitting: Occupational Therapy

## 2013-05-11 ENCOUNTER — Telehealth: Payer: Self-pay

## 2013-05-11 ENCOUNTER — Ambulatory Visit: Payer: Medicare Other | Admitting: Physical Therapy

## 2013-05-11 ENCOUNTER — Ambulatory Visit: Payer: Medicare Other | Admitting: Occupational Therapy

## 2013-05-11 NOTE — Telephone Encounter (Signed)
Patient request temazepam refill.  Please advise.

## 2013-05-11 NOTE — Telephone Encounter (Signed)
Needs to get from PCP

## 2013-05-11 NOTE — Telephone Encounter (Signed)
Informed patient Dr Wynn Banker would not fill temazepam.  Patient says she does not have a family doctor but will find someone else to fill the temazepam prescription.

## 2013-05-13 ENCOUNTER — Ambulatory Visit: Payer: Medicare Other | Admitting: Occupational Therapy

## 2013-05-13 ENCOUNTER — Ambulatory Visit: Payer: Medicare Other | Admitting: Rehabilitative and Restorative Service Providers"

## 2013-05-15 ENCOUNTER — Ambulatory Visit: Payer: Medicare PPO | Admitting: Physical Medicine & Rehabilitation

## 2013-05-15 ENCOUNTER — Encounter: Payer: Medicare PPO | Attending: Physical Medicine & Rehabilitation

## 2013-05-15 DIAGNOSIS — Z8679 Personal history of other diseases of the circulatory system: Secondary | ICD-10-CM | POA: Insufficient documentation

## 2013-05-15 DIAGNOSIS — I739 Peripheral vascular disease, unspecified: Secondary | ICD-10-CM | POA: Insufficient documentation

## 2013-05-15 DIAGNOSIS — I6350 Cerebral infarction due to unspecified occlusion or stenosis of unspecified cerebral artery: Secondary | ICD-10-CM | POA: Insufficient documentation

## 2013-05-15 DIAGNOSIS — Z7901 Long term (current) use of anticoagulants: Secondary | ICD-10-CM | POA: Insufficient documentation

## 2013-05-15 DIAGNOSIS — I82409 Acute embolism and thrombosis of unspecified deep veins of unspecified lower extremity: Secondary | ICD-10-CM | POA: Insufficient documentation

## 2013-05-19 ENCOUNTER — Ambulatory Visit: Payer: Medicare Other | Admitting: Occupational Therapy

## 2013-05-19 ENCOUNTER — Ambulatory Visit: Payer: Medicare Other | Admitting: Physical Therapy

## 2013-05-21 ENCOUNTER — Ambulatory Visit: Payer: Medicare Other | Admitting: Physical Therapy

## 2013-05-21 ENCOUNTER — Ambulatory Visit: Payer: Medicare Other | Admitting: Occupational Therapy

## 2013-05-21 ENCOUNTER — Telehealth: Payer: Self-pay

## 2013-05-21 NOTE — Telephone Encounter (Signed)
We can put her back on Ritalin but will need to come in for UDS and a prescription.  Was supposed to follow up this month

## 2013-05-21 NOTE — Telephone Encounter (Signed)
Patient called requesting something for energy.  She feels very tired and so exhausted it is hard to talk.  Please advise.

## 2013-05-22 NOTE — Telephone Encounter (Signed)
Left message for patient to call office regarding problems with energy.  She needs to be seen.

## 2013-05-25 ENCOUNTER — Ambulatory Visit: Payer: Medicare Other | Admitting: Occupational Therapy

## 2013-05-25 ENCOUNTER — Ambulatory Visit: Payer: Medicare Other | Admitting: Physical Therapy

## 2013-05-27 ENCOUNTER — Ambulatory Visit: Payer: Medicare Other | Attending: Physical Medicine & Rehabilitation

## 2013-05-27 ENCOUNTER — Ambulatory Visit: Payer: Medicare Other | Admitting: Occupational Therapy

## 2013-05-27 ENCOUNTER — Ambulatory Visit: Payer: Medicare Other | Admitting: Physical Therapy

## 2013-05-27 DIAGNOSIS — Z5189 Encounter for other specified aftercare: Secondary | ICD-10-CM | POA: Insufficient documentation

## 2013-05-27 DIAGNOSIS — M6281 Muscle weakness (generalized): Secondary | ICD-10-CM | POA: Insufficient documentation

## 2013-05-27 DIAGNOSIS — I69959 Hemiplegia and hemiparesis following unspecified cerebrovascular disease affecting unspecified side: Secondary | ICD-10-CM | POA: Insufficient documentation

## 2013-05-27 DIAGNOSIS — R269 Unspecified abnormalities of gait and mobility: Secondary | ICD-10-CM | POA: Insufficient documentation

## 2013-05-27 DIAGNOSIS — R4189 Other symptoms and signs involving cognitive functions and awareness: Secondary | ICD-10-CM | POA: Insufficient documentation

## 2013-05-27 DIAGNOSIS — I69919 Unspecified symptoms and signs involving cognitive functions following unspecified cerebrovascular disease: Secondary | ICD-10-CM | POA: Insufficient documentation

## 2013-06-02 ENCOUNTER — Ambulatory Visit: Payer: Medicare Other

## 2013-06-02 ENCOUNTER — Ambulatory Visit: Payer: Medicare Other | Admitting: Physical Therapy

## 2013-06-02 ENCOUNTER — Ambulatory Visit: Payer: Medicare Other | Admitting: Occupational Therapy

## 2013-06-03 ENCOUNTER — Ambulatory Visit: Payer: Medicare Other | Admitting: Occupational Therapy

## 2013-06-04 ENCOUNTER — Ambulatory Visit: Payer: Medicare Other

## 2013-06-04 ENCOUNTER — Encounter: Payer: Medicare Other | Admitting: Occupational Therapy

## 2013-06-04 ENCOUNTER — Ambulatory Visit: Payer: Medicare Other | Admitting: Physical Therapy

## 2013-06-09 ENCOUNTER — Ambulatory Visit: Payer: Medicare Other

## 2013-06-09 ENCOUNTER — Ambulatory Visit: Payer: Medicare Other | Admitting: Occupational Therapy

## 2013-06-09 ENCOUNTER — Ambulatory Visit: Payer: Medicare Other | Admitting: Physical Therapy

## 2013-06-11 ENCOUNTER — Ambulatory Visit: Payer: Medicare Other | Admitting: Physical Therapy

## 2013-06-11 ENCOUNTER — Ambulatory Visit: Payer: Medicare Other | Admitting: Occupational Therapy

## 2013-06-11 ENCOUNTER — Ambulatory Visit: Payer: Medicare Other

## 2013-06-16 ENCOUNTER — Ambulatory Visit: Payer: Medicare Other

## 2013-06-16 ENCOUNTER — Ambulatory Visit: Payer: Medicare Other | Admitting: Occupational Therapy

## 2013-06-16 ENCOUNTER — Ambulatory Visit: Payer: Medicare Other | Admitting: Physical Therapy

## 2013-06-18 ENCOUNTER — Ambulatory Visit: Payer: Medicare Other | Admitting: Occupational Therapy

## 2013-06-18 ENCOUNTER — Ambulatory Visit: Payer: Medicare Other

## 2013-06-18 ENCOUNTER — Ambulatory Visit: Payer: Medicare Other | Admitting: Physical Therapy

## 2013-06-30 ENCOUNTER — Ambulatory Visit: Payer: Medicare Other | Admitting: Physical Therapy

## 2013-07-02 ENCOUNTER — Ambulatory Visit: Payer: Medicare Other | Attending: Physical Medicine & Rehabilitation | Admitting: Physical Therapy

## 2013-07-02 DIAGNOSIS — M6281 Muscle weakness (generalized): Secondary | ICD-10-CM | POA: Insufficient documentation

## 2013-07-02 DIAGNOSIS — I69919 Unspecified symptoms and signs involving cognitive functions following unspecified cerebrovascular disease: Secondary | ICD-10-CM | POA: Insufficient documentation

## 2013-07-02 DIAGNOSIS — I69959 Hemiplegia and hemiparesis following unspecified cerebrovascular disease affecting unspecified side: Secondary | ICD-10-CM | POA: Insufficient documentation

## 2013-07-02 DIAGNOSIS — R4189 Other symptoms and signs involving cognitive functions and awareness: Secondary | ICD-10-CM | POA: Insufficient documentation

## 2013-07-02 DIAGNOSIS — R269 Unspecified abnormalities of gait and mobility: Secondary | ICD-10-CM | POA: Insufficient documentation

## 2013-07-02 DIAGNOSIS — Z5189 Encounter for other specified aftercare: Secondary | ICD-10-CM | POA: Insufficient documentation

## 2013-07-07 ENCOUNTER — Ambulatory Visit: Payer: Medicare Other

## 2013-07-07 ENCOUNTER — Ambulatory Visit: Payer: Medicare Other | Admitting: Physical Therapy

## 2013-07-09 ENCOUNTER — Ambulatory Visit: Payer: Medicare Other | Admitting: Physical Therapy

## 2013-07-09 ENCOUNTER — Ambulatory Visit: Payer: Medicare Other

## 2013-07-14 ENCOUNTER — Telehealth: Payer: Self-pay | Admitting: Physical Medicine & Rehabilitation

## 2013-07-14 ENCOUNTER — Ambulatory Visit: Payer: Medicare Other

## 2013-07-14 ENCOUNTER — Ambulatory Visit: Payer: Medicare Other | Admitting: Physical Therapy

## 2013-07-14 NOTE — Telephone Encounter (Signed)
[name] is in need of a refill of Ritalin.   Her speech therapist says she cannot continue without it.  She cannot come to appointment this Friday at 9:15, which is available, because it is too early for her.  Next available is September 9th.  We have added her to appointment cancellation list.  Do you want to refill Ritalin before she can be seen?

## 2013-07-15 NOTE — Telephone Encounter (Signed)
OK to give one month supply ritalin 5mg  po qam and qnoon #60 no rf

## 2013-07-17 ENCOUNTER — Other Ambulatory Visit: Payer: Self-pay

## 2013-07-17 ENCOUNTER — Telehealth: Payer: Self-pay

## 2013-07-17 MED ORDER — METHYLPHENIDATE HCL 5 MG PO TABS: 5.0000 mg | ORAL_TABLET | Freq: Every day | ORAL | Status: DC

## 2013-07-17 NOTE — Telephone Encounter (Signed)
I called in patient's Ritalin RX not realizing it had to be printed. I went back and printed the RX for the patient to pick up.

## 2013-07-17 NOTE — Telephone Encounter (Signed)
Left patient a voicemail to let her know that her RX was called in to [facility].

## 2013-07-17 NOTE — Telephone Encounter (Signed)
Called in refill of Ritalin 5mg  to [facility].

## 2013-07-21 ENCOUNTER — Ambulatory Visit: Payer: Medicare Other | Admitting: Physical Therapy

## 2013-07-21 ENCOUNTER — Ambulatory Visit: Payer: Medicare Other

## 2013-07-23 ENCOUNTER — Ambulatory Visit: Payer: Medicare Other | Admitting: Physical Therapy

## 2013-07-23 ENCOUNTER — Ambulatory Visit: Payer: Medicare Other

## 2013-07-31 ENCOUNTER — Ambulatory Visit: Payer: Medicare Other | Admitting: Physical Medicine & Rehabilitation

## 2013-08-10 ENCOUNTER — Encounter (HOSPITAL_COMMUNITY): Payer: Self-pay | Admitting: Emergency Medicine

## 2013-08-10 DIAGNOSIS — M79609 Pain in unspecified limb: Secondary | ICD-10-CM | POA: Insufficient documentation

## 2013-08-10 DIAGNOSIS — Z79899 Other long term (current) drug therapy: Secondary | ICD-10-CM | POA: Insufficient documentation

## 2013-08-10 DIAGNOSIS — Z7901 Long term (current) use of anticoagulants: Secondary | ICD-10-CM | POA: Insufficient documentation

## 2013-08-10 DIAGNOSIS — Z8673 Personal history of transient ischemic attack (TIA), and cerebral infarction without residual deficits: Secondary | ICD-10-CM | POA: Insufficient documentation

## 2013-08-10 DIAGNOSIS — Z86718 Personal history of other venous thrombosis and embolism: Secondary | ICD-10-CM | POA: Insufficient documentation

## 2013-08-10 DIAGNOSIS — I739 Peripheral vascular disease, unspecified: Secondary | ICD-10-CM | POA: Insufficient documentation

## 2013-08-10 NOTE — ED Notes (Addendum)
Pt. reports left thigh pain onset today , denies injury or fall , pain worse when walking , pt. stated history of DVT on affected leg .

## 2013-08-11 ENCOUNTER — Emergency Department (HOSPITAL_COMMUNITY)
Admission: EM | Admit: 2013-08-11 | Discharge: 2013-08-11 | Disposition: A | Discharge status: Home / Self Care | Payer: Medicare Other | Attending: Emergency Medicine | Admitting: Emergency Medicine

## 2013-08-11 ENCOUNTER — Encounter (INDEPENDENT_AMBULATORY_CARE_PROVIDER_SITE_OTHER): Payer: Medicare Other | Admitting: *Deleted

## 2013-08-11 DIAGNOSIS — M79609 Pain in unspecified limb: Secondary | ICD-10-CM

## 2013-08-11 DIAGNOSIS — M79604 Pain in right leg: Secondary | ICD-10-CM

## 2013-08-11 LAB — BASIC METABOLIC PANEL
BUN: 6 mg/dL (ref 6–23)
Calcium: 9.1 mg/dL (ref 8.4–10.5)
GFR calc Af Amer: >90 mL/min (ref >90)
GFR calc non Af Amer: >90 mL/min (ref >90)
Glucose, Bld: 86 mg/dL (ref 70–99)
Potassium: 3.2 mEq/L — ABNORMAL LOW (ref 3.5–5.1)
Sodium: 139 mEq/L (ref 135–145)

## 2013-08-11 LAB — PROTIME-INR: INR: 1.41 (ref 0.00–1.49)

## 2013-08-11 MED ORDER — HYDROCODONE-ACETAMINOPHEN 5-325 MG PO TABS: 1.0000 | ORAL_TABLET | ORAL | Status: DC | PRN

## 2013-08-11 MED ORDER — ENOXAPARIN SODIUM 80 MG/0.8ML ~~LOC~~ SOLN
1.0000 mg/kg | Freq: Once | SUBCUTANEOUS | Status: AC
  Administered 2013-08-11: 75 mg via SUBCUTANEOUS
  Filled 2013-08-11: qty 0.8

## 2013-08-11 NOTE — ED Notes (Signed)
MD at bedside. 

## 2013-08-11 NOTE — ED Notes (Signed)
Pt is here with left leg pain from knee to thigh and has left sided weakness from previous CVA.  Pt has palpable pulse to LLE.  Pt is on coumadin for previous DVT.  Explained lab work needed prior to disposition decision.  Patient is requesting to see MD and MD aware

## 2013-08-11 NOTE — ED Provider Notes (Signed)
CSN: [id number]     Arrival date & time 08/10/13  2044 History   First MD Initiated Contact with Patient 08/11/13 0120     Chief Complaint  Patient presents with  . Leg Pain   (Consider location/radiation/quality/duration/timing/severity/associated sxs/prior Treatment) HPI Comments: Hx of numerous clots - currently on Coumadin. States L thigh pain today similar to prior DVT pain.   Patient is a 37 y.o. female presenting with leg pain. The history is provided by the patient.  Leg Pain Location:  Leg Injury: no   Leg location:  L upper leg Pain details:    Quality:  Aching   Radiates to:  Does not radiate   Severity:  Mild   Onset quality:  Sudden   Timing:  Constant   Progression:  Unchanged Chronicity:  Recurrent Dislocation: no   Foreign body present:  No foreign bodies Prior injury to area:  No Relieved by:  Nothing Worsened by:  Nothing tried Ineffective treatments:  None tried Associated symptoms: no fever     Past Medical History  Diagnosis Date  . PVD (peripheral vascular disease)   . Benign tumor of back   . Swelling, lymph nodes 2006    intermittant, benign  . DVT (deep venous thrombosis)   . Stroke     last week here   Past Surgical History  Procedure Laterality Date  . Ivc filter placement    . Breast surgery      bil breast implants   Family History  Problem Relation Age of Onset  . Cancer - Other Father   . Coronary artery disease Mother   . Stroke Mother   . Hypertension Mother   . Hypertension Sister    History  Substance Use Topics  . Smoking status: Never Smoker   . Smokeless tobacco: Never Used  . Alcohol Use: 3.0 oz/week    5 Glasses of wine per week   OB History   Grav Para Term Preterm Abortions TAB SAB Ect Mult Living                 Review of Systems  Constitutional: Negative for fever.  Respiratory: Negative for cough and shortness of breath.   All other systems reviewed and are negative.    Allergies  Pork-derived  products  Home Medications   Current Outpatient Rx  Name  Route  Sig  Dispense  Refill  . ferrous sulfate 325 (65 FE) MG tablet   Oral   Take 325 mg by mouth daily as needed (when needed for low iron).         . FOLIC ACID PO   Oral   Take 1 tablet by mouth daily.         . methotrexate (RHEUMATREX) 2.5 MG tablet   Oral   Take 4 mg by mouth once a week. Caution:Chemotherapy. Protect from light.         . methylphenidate (RITALIN) 5 MG tablet   Oral   Take 1 tablet (5 mg total) by mouth daily with breakfast.   60 tablet   0   . Multiple Vitamins-Minerals (MULTIVITAMIN WITH MINERALS) tablet   Oral   Take 1 tablet by mouth daily.         . predniSONE (DELTASONE) 10 MG tablet   Oral   Take 15 mg by mouth daily.         Marland Kitchen warfarin (COUMADIN) 5 MG tablet   Oral   Take 2.5-5 mg by mouth daily.  Takes 5mg  one day and the next day 2.5mg           BP 179/87  Pulse 72  Temp(Src) 98.3 F (36.8 C) (Oral)  Resp 16  Ht 5\' 7"  (1.702 m)  Wt 162 lb (73.483 kg)  BMI 25.37 kg/m2  SpO2 99% Physical Exam  Nursing note and vitals reviewed. Constitutional: She is oriented to person, place, and time. She appears well-developed and well-nourished. No distress.  HENT:  Head: Normocephalic and atraumatic.  Eyes: EOM are normal. Pupils are equal, round, and reactive to light.  Neck: Normal range of motion. Neck supple.  Cardiovascular: Normal rate and regular rhythm.  Exam reveals no friction rub.   No murmur heard. Pulmonary/Chest: Effort normal and breath sounds normal. No respiratory distress. She has no wheezes. She has no rales.  Abdominal: Soft. She exhibits no distension. There is no tenderness. There is no rebound.  Musculoskeletal: Normal range of motion. She exhibits no edema (no appreciable edema in L leg).  L arm and leg with weakness. L arm paresis. L leg weakness. No sensory changes in L leg  Neurological: She is alert and oriented to person, place, and time. No  cranial nerve deficit. She exhibits abnormal muscle tone (L arm).  Skin: No rash noted. She is not diaphoretic.    ED Course  Procedures (including critical care time) Labs Review Labs Reviewed  BASIC METABOLIC PANEL - Abnormal; Notable for the following:    Potassium 3.2 (*)    All other components within normal limits  PROTIME-INR - Abnormal; Notable for the following:    Prothrombin Time 16.9 (*)    All other components within normal limits   Imaging Review No results found.  MDM   1. Leg pain, right    37 year old female presents with pain in her left leg. Concern is a DVT. She has a history of multiple DVTs and is on anticoagulation for this. Patient has not recently changed her Coumadin dose. She denies any trauma to this leg.  On exam, vitals are stable. She has no appreciable swelling in the left leg. She has normal pulses in the left foot. No concern for trauma. I am concerned she might have a DVT with her history of chronic and numerous DVTs. I am unable to get ultrasound at night. I will check her INR to make sure therapeutic and I do not need to give her Lovenox at this time. INR sub therapeutic. Kidney function okay. Lovenox given. Instructed to return for LLE doppler US this morning.   Dagmar Hait, MD 08/11/13 [phone]

## 2013-09-10 ENCOUNTER — Encounter: Payer: Self-pay | Admitting: Physical Medicine & Rehabilitation

## 2013-09-10 ENCOUNTER — Ambulatory Visit (HOSPITAL_BASED_OUTPATIENT_CLINIC_OR_DEPARTMENT_OTHER): Payer: Medicare Other | Admitting: Physical Medicine & Rehabilitation

## 2013-09-10 ENCOUNTER — Encounter: Payer: Medicare Other | Attending: Physical Medicine & Rehabilitation

## 2013-09-10 VITALS — BP 140/82 | HR 82 | Resp 14 | Ht 68.0 in | Wt 147.0 lb

## 2013-09-10 DIAGNOSIS — I69919 Unspecified symptoms and signs involving cognitive functions following unspecified cerebrovascular disease: Secondary | ICD-10-CM | POA: Insufficient documentation

## 2013-09-10 DIAGNOSIS — M314 Aortic arch syndrome [Takayasu]: Secondary | ICD-10-CM | POA: Insufficient documentation

## 2013-09-10 DIAGNOSIS — G8110 Spastic hemiplegia affecting unspecified side: Secondary | ICD-10-CM | POA: Insufficient documentation

## 2013-09-10 DIAGNOSIS — Z7901 Long term (current) use of anticoagulants: Secondary | ICD-10-CM | POA: Insufficient documentation

## 2013-09-10 DIAGNOSIS — I69959 Hemiplegia and hemiparesis following unspecified cerebrovascular disease affecting unspecified side: Secondary | ICD-10-CM | POA: Insufficient documentation

## 2013-09-10 MED ORDER — METHYLPHENIDATE HCL 5 MG PO TABS: 10.0000 mg | ORAL_TABLET | Freq: Two times a day (BID) | ORAL | Status: DC

## 2013-09-10 MED ORDER — METHYLPHENIDATE HCL 10 MG PO TABS: 10.0000 mg | ORAL_TABLET | Freq: Two times a day (BID) | ORAL | Status: DC

## 2013-09-10 NOTE — Patient Instructions (Signed)
restart ritalin  Botox next vist- will reorder Outpt OT after injection

## 2013-09-10 NOTE — Progress Notes (Signed)
Subjective:    Patient ID: [name], female    DOB: 1976/06/30, 37 y.o.   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 Admitted  on 02/16/2013, with complaints of headache, left-sided weakness, and  altered mental status. INR on admission of 1.14 with noted poor medical  compliance, on Coumadin. MRI of the brain showed acute infarct right  PCA territory without hemorrhages as well as scattered small areas of  acute infarct left frontal and parietal lobe  HPI Patient was switched to coumadin, discharged to home after inpatient rehabilitation stay but was readmitted to the hospital on 03/26/2013 for left visual complaints  Repeat MRI on 03-2013  This patient has had a recent right MCA infarct. There appears to  be extension of acute infarct in the head of the caudate and right  medial thalamus.  Switched back to Coumadin.  Amb with Quad Dresses and bathing self Needs help with meals and donning bra  No falls Would like to resume ritalin, problem concentrating.  SLP noted that attn and concentration was impaired Had acute Left femoral DVT diagnosed 08/11/2013, had pain with walking initially Plans to take an on line course Pain Inventory Average Pain 0 Pain Right Now 0 My pain is no pain  In the last 24 hours, has pain interfered with the following? General activity 0 Relation with others 0 Enjoyment of life 0 What TIME of day is your pain at its worst? no pain Sleep (in general) Good  Pain is worse with: no pain Pain improves with: no pain Relief from Meds: no pain meds  Mobility walk without assistance use a cane ability to climb steps?  yes needs help with transfers  Function disabled: date disabled na I need assistance with the following:  meal  prep, household duties and shopping  Neuro/Psych No problems in this area  Prior Studies Any changes since last visit?  no  Physicians involved in your care Any changes since last visit?  no   Family History  Problem Relation Age of Onset  . Cancer - Other Father   . Coronary artery disease Mother   . Stroke Mother   . Hypertension Mother   . Hypertension Sister    History   Social History  . Marital Status: Legally Separated    Spouse Name: N/A    Number of Children: N/A  . Years of Education: N/A   Social History Main Topics  . Smoking status: Never Smoker   . Smokeless tobacco: Never Used  . Alcohol Use: 3.0 oz/week    5 Glasses of wine per week  . Drug Use: No  . Sexual Activity: None   Other Topics Concern  . None   Social History Narrative  . None   Past Surgical History  Procedure Laterality Date  . Ivc filter placement    . Breast surgery      bil breast implants   Past Medical History  Diagnosis Date  . PVD (peripheral vascular disease)   . Benign tumor of back   . Swelling, lymph nodes 2006    intermittant, benign  . DVT (deep venous thrombosis)   . Stroke     last week here   BP 140/82  Pulse 82  Resp 14  Ht 5\' 8"  (  1.727 m)  Wt 147 lb (66.679 kg)  BMI 22.36 kg/m2  SpO2 98%     Review of Systems  All other systems reviewed and are negative.       Objective:   Physical Exam  Nursing note and vitals reviewed.  Constitutional: She is oriented to person, place, and time. She appears well-developed and well-nourished.  HENT:  Head: Normocephalic and atraumatic.  Eyes: Conjunctivae and EOM are normal. Pupils are equal, round, and reactive to light.  Visual fields are intact to testing  Neck: Normal range of motion. Neck supple.  Musculoskeletal:  Left shoulder: She exhibits decreased range of motion and pain. She exhibits no tenderness.  Mild pain with shoulder range of motion  Neurological: She is alert and oriented to  person, place, and time. A sensory deficit is present. She exhibits abnormal muscle tone. Coordination and gait abnormal.  Reflex Scores:  Tricep reflexes are 0 on the left side.  Bicep reflexes are 0 on the left side.  Brachioradialis reflexes are 0 on the left side.  Patellar reflexes are 2+ on the left side. 1/5 left bicep,0/5 tricep, 1/5 grip, 4 minus left knee extension 3 minus knee extension 1/5 ankle dorsiflexion plantarflexion  Tone: Ashworth grade 3 in the left finger flexors and wrist flexors as well as biceps  Psychiatric: Her affect is blunt. Her speech is delayed. She is slowed. Cognition and memory are impaired. She is inattentive.  Patient is able, back from 10-1. Unable to do serial 3s      Assessment & Plan:  1. 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 Has residual cognitive deficits resulting in decreased attention and concentration Recommend Botox for the left biceps as well as left finger flexors and wrist flexors. Gave patient and husband Botox pamphlet as well as discussed pros and cons. I anticipate she'll need outpatient occupational therapy post injection.  In regards to her problems with attention and concentration will resume Ritalin but increase to 10 mg If she takes an on line course, she should do coursework within 4 hours of the Ritalin dose  Followup with Coumadin clinic to maintain pro time INR 2 to 3

## 2013-09-23 ENCOUNTER — Telehealth: Payer: Self-pay

## 2013-09-23 NOTE — Telephone Encounter (Signed)
Patient is requesting to take her Ritalin bid. She said she feels that the medication is not helping when only taking in the morning. Please advise.

## 2013-09-24 NOTE — Telephone Encounter (Signed)
OK to resume BID,that was her hospital dose Will need to come in early for new RX

## 2013-09-24 NOTE — Telephone Encounter (Signed)
Left voice mail for patient to return call to clinic.

## 2013-09-30 ENCOUNTER — Telehealth: Payer: Self-pay

## 2013-09-30 NOTE — Telephone Encounter (Signed)
Patient called to follow up on ritalin changed.  Please advise.

## 2013-09-30 NOTE — Telephone Encounter (Signed)
Patient was curious if there was a stronger medication than the Ritalin that she could take instead?

## 2013-10-01 NOTE — Telephone Encounter (Signed)
Contacted patient to inform her that Dr. Wynn Banker would discuss medication at next visit.

## 2013-10-01 NOTE — Telephone Encounter (Signed)
Will discuss at next visit this month.  Cont current dose

## 2013-10-26 ENCOUNTER — Telehealth: Payer: Self-pay

## 2013-10-26 MED ORDER — METHYLPHENIDATE HCL 10 MG PO TABS: 10.0000 mg | ORAL_TABLET | Freq: Two times a day (BID) | ORAL | Status: DC

## 2013-10-26 NOTE — Telephone Encounter (Signed)
Patient says she spoke with Dr Wynn Banker over the weekend and needs a refill of her Ritalin but needs a stronger dose.  Please advise.

## 2013-10-26 NOTE — Telephone Encounter (Signed)
Patient aware of ritalin increase and that script is ready for pick up.

## 2013-10-26 NOTE — Telephone Encounter (Signed)
Increased dose to 20 mg in the morning and 10 mg at noon #90 prescription printed patient may come and pick up

## 2013-10-30 ENCOUNTER — Telehealth: Payer: Self-pay

## 2013-10-30 NOTE — Telephone Encounter (Signed)
Left message for patient call office regarding her medication change request.

## 2013-10-30 NOTE — Telephone Encounter (Signed)
Patient has been seen by Dr. Pearlean Brownie, she can follow up with him

## 2013-10-30 NOTE — Telephone Encounter (Signed)
She is at the maximum dose. I have no other suggestions. She can make an appointment with a neurologist if she wishes

## 2013-10-30 NOTE — Telephone Encounter (Signed)
Patient would like to be referred to a neurologist.  She does not have a specific neurologist she request to see.

## 2013-10-30 NOTE — Telephone Encounter (Signed)
Patient called regarding her ritalin.  She does not feel like it is helping her.  She wishes to get something stronger or a different medication if possible.  Please advise.

## 2013-11-02 NOTE — Telephone Encounter (Signed)
Left message advising patient to follow up with Dr Pearlean Brownie.

## 2013-11-27 ENCOUNTER — Encounter: Payer: Medicare Other | Attending: Physical Medicine & Rehabilitation

## 2013-11-27 ENCOUNTER — Ambulatory Visit (HOSPITAL_BASED_OUTPATIENT_CLINIC_OR_DEPARTMENT_OTHER): Payer: Medicare Other | Admitting: Physical Medicine & Rehabilitation

## 2013-11-27 ENCOUNTER — Encounter: Payer: Self-pay | Admitting: Physical Medicine & Rehabilitation

## 2013-11-27 VITALS — BP 149/88 | HR 95 | Resp 14 | Ht 68.0 in | Wt 171.0 lb

## 2013-11-27 DIAGNOSIS — Z7901 Long term (current) use of anticoagulants: Secondary | ICD-10-CM | POA: Insufficient documentation

## 2013-11-27 DIAGNOSIS — I69919 Unspecified symptoms and signs involving cognitive functions following unspecified cerebrovascular disease: Secondary | ICD-10-CM | POA: Insufficient documentation

## 2013-11-27 DIAGNOSIS — I69959 Hemiplegia and hemiparesis following unspecified cerebrovascular disease affecting unspecified side: Secondary | ICD-10-CM | POA: Insufficient documentation

## 2013-11-27 DIAGNOSIS — M314 Aortic arch syndrome [Takayasu]: Secondary | ICD-10-CM | POA: Insufficient documentation

## 2013-11-27 DIAGNOSIS — G8110 Spastic hemiplegia affecting unspecified side: Secondary | ICD-10-CM

## 2013-11-27 MED ORDER — METHYLPHENIDATE HCL 10 MG PO TABS: 10.0000 mg | ORAL_TABLET | Freq: Two times a day (BID) | ORAL | Status: DC

## 2013-11-27 NOTE — Progress Notes (Signed)
Botox Injection for spasticity using needle EMG guidance and E stim guidance  Dilution: 50 Units/ml Indication: Severe spasticity which interferes with ADL,mobility and/or  hygiene and is unresponsive to medication management and other conservative care Informed consent was obtained after describing risks and benefits of the procedure with the patient. This includes bleeding, bruising, infection, excessive weakness, or medication side effects. A [facility]S form is on file and signed. Needle: 70mm,25 gauge needle electrode Number of units per muscle Biceps100 units FCR50 FCU0 FDS50 FDP50 FPL25 Palmaris Longus 25  All injections were done after obtaining appropriate EMG activity and after negative drawback for blood. The patient tolerated the procedure well. Post procedure instructions were given. A followup appointment was made.

## 2013-11-27 NOTE — Patient Instructions (Signed)

## 2013-12-02 ENCOUNTER — Telehealth: Payer: Self-pay

## 2013-12-02 ENCOUNTER — Other Ambulatory Visit: Payer: Self-pay

## 2013-12-02 DIAGNOSIS — G8110 Spastic hemiplegia affecting unspecified side: Secondary | ICD-10-CM

## 2013-12-02 DIAGNOSIS — I69919 Unspecified symptoms and signs involving cognitive functions following unspecified cerebrovascular disease: Secondary | ICD-10-CM

## 2013-12-02 NOTE — Telephone Encounter (Signed)
Please enter order for OT eval at [facility]

## 2013-12-02 NOTE — Telephone Encounter (Signed)
Order put in for OT evaluation at [facility].

## 2013-12-02 NOTE — Telephone Encounter (Addendum)
Patient wants to know if she would be going back to OT after the botox injections?

## 2013-12-28 ENCOUNTER — Ambulatory Visit: Payer: Medicare Other | Admitting: Physical Medicine & Rehabilitation

## 2013-12-31 ENCOUNTER — Telehealth: Payer: Self-pay

## 2013-12-31 NOTE — Telephone Encounter (Signed)
Left message advising patient she needs to schedule an appointment in order to get ritalin refill.

## 2013-12-31 NOTE — Telephone Encounter (Signed)
Needs at least nursing visit

## 2013-12-31 NOTE — Telephone Encounter (Signed)
Patient canceled her 2/2 appt and did not want to reschedule at this time, but she is requesting a refill on Ritalin. Please advise.

## 2014-01-05 ENCOUNTER — Telehealth: Payer: Self-pay | Admitting: *Deleted

## 2014-01-05 ENCOUNTER — Encounter: Payer: Medicare Other | Attending: Physical Medicine & Rehabilitation | Admitting: *Deleted

## 2014-01-05 ENCOUNTER — Other Ambulatory Visit: Payer: Self-pay | Admitting: *Deleted

## 2014-01-05 ENCOUNTER — Encounter: Payer: Self-pay | Admitting: *Deleted

## 2014-01-05 VITALS — HR 74 | Resp 14

## 2014-01-05 DIAGNOSIS — Z86718 Personal history of other venous thrombosis and embolism: Secondary | ICD-10-CM | POA: Insufficient documentation

## 2014-01-05 DIAGNOSIS — I639 Cerebral infarction, unspecified: Secondary | ICD-10-CM

## 2014-01-05 DIAGNOSIS — I69959 Hemiplegia and hemiparesis following unspecified cerebrovascular disease affecting unspecified side: Secondary | ICD-10-CM | POA: Insufficient documentation

## 2014-01-05 DIAGNOSIS — I69919 Unspecified symptoms and signs involving cognitive functions following unspecified cerebrovascular disease: Secondary | ICD-10-CM | POA: Insufficient documentation

## 2014-01-05 DIAGNOSIS — G8110 Spastic hemiplegia affecting unspecified side: Secondary | ICD-10-CM

## 2014-01-05 DIAGNOSIS — I739 Peripheral vascular disease, unspecified: Secondary | ICD-10-CM | POA: Insufficient documentation

## 2014-01-05 DIAGNOSIS — Z7901 Long term (current) use of anticoagulants: Secondary | ICD-10-CM | POA: Insufficient documentation

## 2014-01-05 MED ORDER — METHYLPHENIDATE HCL 10 MG PO TABS: 10.0000 mg | ORAL_TABLET | Freq: Two times a day (BID) | ORAL | Status: DC

## 2014-01-05 NOTE — Progress Notes (Signed)
Here for pill count and medication refills.Ritalin fill date 11/30/13  #90 Today NV# 34      [name] had many questions about her situation and about her medication.  I have listed her concerns in a telephone message routed to Dr Letta Pate.  See this note.  Refill was given for the ritalin.  Pharmacy called to make sure they dispense it to read 2 in am and 1 at noon since sig says i tablet bid and a note to pharmacy says 2 in am and 1 at noon.  Sig changed in chart to reflect this and not just noted in comment section to pharmacy. Return in one month to see RN for med refill and then she can schedule for botox in April if she would like to have this done again for her spasticity.

## 2014-01-05 NOTE — Patient Instructions (Signed)
Follow up in one with RN

## 2014-01-05 NOTE — Telephone Encounter (Signed)
[name] was in to see me today for her ritalin refill. She was visibly upset and frustrated over her care and progress. She has multiple questions that I was unable to answer and I told her I would send them to you and we would get back with her today or tomorrow morning.  She is doing much research on her own. 1)- Her first question was if there was any thing that could be done (test on her brain) that could show that she is getting better.   2 )Her OT has not begun and she was not aware(!) that it had been ordered and has not been contacted. After hearing this, Faythe Dingwall and April have been on this to get the appointment set, since neuro op OT has not contacted her and she is upset about this. We are working to get this under way. (With her brain injury she is not able to get to the phone always and leaving message is not reliable.)  Would she be a candidate for PT as well?  (Her biggest frustration was that the medication was not working to give her more focus and not be so sleepy. After looking at the notes and prescription I see it was increased to 2 in the am and one at noon.  I have called her back to make sure that she is taking it in this manner.  She said she has some but wasn't sure when she would get a new rx so she has not over the last few days.  I told her to be sure to take 2 in am and 1 at noon consistently and see if she does not have some improvement.) Please advise about her question about the scans and PT

## 2014-01-05 NOTE — Telephone Encounter (Signed)
RX printed early for controlled medication for the visit with RN on 01/06/14 (to be signed by MD) 

## 2014-01-06 NOTE — Telephone Encounter (Signed)
There is no type of scan to document progress This is a clinical evaluation  Can have PT eval .  PT will have to eval if they have acheivable goals which often is not the case this long after a stroke

## 2014-01-07 NOTE — Telephone Encounter (Signed)
Advised patient PT evaluation will be ordered.  She understands and aware.  PT order placed.

## 2014-01-13 ENCOUNTER — Telehealth: Payer: Self-pay | Admitting: *Deleted

## 2014-01-13 NOTE — Telephone Encounter (Signed)
[name] is calling back because she cannot stay awake and her Ritalin, even at the increase dose is not working.  She says she is taking caffeine shots to try and help her stay awake but nothing is working.  She sleeps ok at night but cannot stay awake during day.

## 2014-01-14 NOTE — Telephone Encounter (Signed)
Advised patient to have PCP refer her to sleep specialist.  She says she does not have a PCP and has seen a sleep specialist in the past and was told she has insomnia.  Advised patient we had nothing further to offer.  She does not want to go back to a sleep specialist and would like to know if we have any other recommendations.  Advised patient she needs to get a PCP.

## 2014-01-14 NOTE — Telephone Encounter (Signed)
The patient needs referral to a sleep specialist. I would recommend seeing Dr. Brett Fairy for eval I really had no further suggestions

## 2014-01-14 NOTE — Telephone Encounter (Signed)
No other suggestions

## 2014-01-15 NOTE — Telephone Encounter (Signed)
Left message for patient making her aware we have no other suggestions.

## 2014-01-27 ENCOUNTER — Other Ambulatory Visit: Payer: Self-pay | Admitting: *Deleted

## 2014-01-27 MED ORDER — METHYLPHENIDATE HCL 10 MG PO TABS: ORAL_TABLET | ORAL | Status: DC

## 2014-01-27 NOTE — Telephone Encounter (Signed)
RX printed early for controlled medication for the visit with RN on 01/29/14 (to be signed by MD)

## 2014-01-29 ENCOUNTER — Telehealth: Payer: Self-pay | Admitting: *Deleted

## 2014-01-29 ENCOUNTER — Encounter: Payer: Self-pay | Admitting: *Deleted

## 2014-01-29 ENCOUNTER — Encounter: Payer: Medicare Other | Attending: Physical Medicine & Rehabilitation | Admitting: *Deleted

## 2014-01-29 VITALS — BP 174/82 | HR 108 | Resp 14

## 2014-01-29 DIAGNOSIS — I69959 Hemiplegia and hemiparesis following unspecified cerebrovascular disease affecting unspecified side: Secondary | ICD-10-CM | POA: Insufficient documentation

## 2014-01-29 DIAGNOSIS — I639 Cerebral infarction, unspecified: Secondary | ICD-10-CM

## 2014-01-29 DIAGNOSIS — G8110 Spastic hemiplegia affecting unspecified side: Secondary | ICD-10-CM

## 2014-01-29 DIAGNOSIS — I739 Peripheral vascular disease, unspecified: Secondary | ICD-10-CM | POA: Insufficient documentation

## 2014-01-29 DIAGNOSIS — I69919 Unspecified symptoms and signs involving cognitive functions following unspecified cerebrovascular disease: Secondary | ICD-10-CM | POA: Insufficient documentation

## 2014-01-29 NOTE — Telephone Encounter (Signed)
I ended up not changing [name] over to the controlled release ritalin since when ordering it there was a flag that said insurance wouldn't cover and prior auth would be needed.  I told her that she would need to discuss with pharmacist and ins company what the cost would be and decide if it would be cost prohibitive.  She has gotten something off the Internet as well called Adder-Plex dietary supplement that is suppose to make you more focused and increase memory.  I cautioned her to ask the pharmacist about this and if it would be a conflict with the ritalin because I could not guide her.  She is frustrated because of still being sleepy about 3 hours after the noon Ritalin has worn off.  She doesn't understand why we cannot just increase the dose. She has been looking on the Internet and knows there are higher doses of the ritalin available.  I told her that she has complicating factors that affect what she is prescribed. What she is looking at is related to ADD/ADHD and she has had a stroke.  Her bp is already elevated 174/82 and pulse is 108.  I explained that the stimulants have an affect on cardiac system and therefore increasing does can put her at higher risk of having another stroke, and what she reads on the Internet cannot be applied to herself. She is coming back to see you next month for botox and may be asking about this again.

## 2014-01-29 NOTE — Patient Instructions (Signed)
Follow up with [name] for botox

## 2014-01-29 NOTE — Progress Notes (Signed)
Here for refill on ritalin.  See telephone note to Dr Letta Pate with summary of visit.  Refill given of regular ritalin 10 mg # 90

## 2014-01-29 NOTE — Telephone Encounter (Signed)
I. agree with your note completely [name]

## 2014-02-01 ENCOUNTER — Encounter: Payer: Self-pay | Admitting: *Deleted

## 2014-02-08 ENCOUNTER — Telehealth: Payer: Self-pay

## 2014-02-08 NOTE — Telephone Encounter (Signed)
FYI::: Patient wanted to inform you that the Ritalin 10 mg TID is really helping.

## 2014-03-05 ENCOUNTER — Encounter: Payer: Medicare Other | Attending: Physical Medicine & Rehabilitation

## 2014-03-05 ENCOUNTER — Ambulatory Visit (HOSPITAL_BASED_OUTPATIENT_CLINIC_OR_DEPARTMENT_OTHER): Payer: Medicare Other | Admitting: Physical Medicine & Rehabilitation

## 2014-03-05 ENCOUNTER — Encounter: Payer: Self-pay | Admitting: Physical Medicine & Rehabilitation

## 2014-03-05 VITALS — BP 149/82 | HR 93 | Resp 16

## 2014-03-05 DIAGNOSIS — G8110 Spastic hemiplegia affecting unspecified side: Secondary | ICD-10-CM

## 2014-03-05 DIAGNOSIS — Z7901 Long term (current) use of anticoagulants: Secondary | ICD-10-CM | POA: Insufficient documentation

## 2014-03-05 DIAGNOSIS — M314 Aortic arch syndrome [Takayasu]: Secondary | ICD-10-CM | POA: Diagnosis not present

## 2014-03-05 MED ORDER — METHYLPHENIDATE HCL 10 MG PO TABS: ORAL_TABLET | ORAL | Status: DC

## 2014-03-05 NOTE — Patient Instructions (Signed)

## 2014-03-05 NOTE — Progress Notes (Signed)
Botox Injection for spasticity using needle EMG guidance and E stim guidance  Dilution: 50 Units/ml  Indication: Severe spasticity which interferes with ADL,mobility and/or hygiene and is unresponsive to medication management and other conservative care  Informed consent was obtained after describing risks and benefits of the procedure with the patient. This includes bleeding, bruising, infection, excessive weakness, or medication side effects. A REMS form is on file and signed.  Had good relief of spasticity after prior Botox injection November 27 2013 Needle: 64mm,25 gauge needle electrode  Number of units per muscle  Biceps100 units  FCR50  FCU0  FDS50  FDP50  FPL25  Palmaris Longus 25  All injections were done after obtaining appropriate EMG activity and after negative drawback for blood. The patient tolerated the procedure well. Post procedure instructions were given. A followup appointment was made.    Patient states that she no longer takes methylphenidate will discontinue

## 2014-03-08 ENCOUNTER — Emergency Department (HOSPITAL_COMMUNITY): Payer: Medicare Other

## 2014-03-08 ENCOUNTER — Emergency Department (HOSPITAL_COMMUNITY)
Admission: EM | Admit: 2014-03-08 | Discharge: 2014-03-08 | Disposition: A | Discharge status: Home / Self Care | Payer: Medicare Other | Attending: Emergency Medicine | Admitting: Emergency Medicine

## 2014-03-08 ENCOUNTER — Encounter (HOSPITAL_COMMUNITY): Payer: Self-pay | Admitting: Emergency Medicine

## 2014-03-08 DIAGNOSIS — R51 Headache: Secondary | ICD-10-CM | POA: Insufficient documentation

## 2014-03-08 DIAGNOSIS — R519 Headache, unspecified: Secondary | ICD-10-CM

## 2014-03-08 DIAGNOSIS — Z86711 Personal history of pulmonary embolism: Secondary | ICD-10-CM | POA: Insufficient documentation

## 2014-03-08 DIAGNOSIS — Z7901 Long term (current) use of anticoagulants: Secondary | ICD-10-CM | POA: Insufficient documentation

## 2014-03-08 DIAGNOSIS — R5383 Other fatigue: Secondary | ICD-10-CM

## 2014-03-08 DIAGNOSIS — R791 Abnormal coagulation profile: Secondary | ICD-10-CM | POA: Insufficient documentation

## 2014-03-08 DIAGNOSIS — R5381 Other malaise: Secondary | ICD-10-CM | POA: Insufficient documentation

## 2014-03-08 DIAGNOSIS — Z79899 Other long term (current) drug therapy: Secondary | ICD-10-CM | POA: Insufficient documentation

## 2014-03-08 DIAGNOSIS — Z8679 Personal history of other diseases of the circulatory system: Secondary | ICD-10-CM | POA: Insufficient documentation

## 2014-03-08 DIAGNOSIS — Z86718 Personal history of other venous thrombosis and embolism: Secondary | ICD-10-CM | POA: Insufficient documentation

## 2014-03-08 DIAGNOSIS — Z872 Personal history of diseases of the skin and subcutaneous tissue: Secondary | ICD-10-CM | POA: Insufficient documentation

## 2014-03-08 LAB — CBC WITH DIFFERENTIAL/PLATELET
BASOS PCT: 0 % (ref 0–1)
Basophils Absolute: 0 10*3/uL (ref 0.0–0.1)
Eosinophils Absolute: 0.1 10*3/uL (ref 0.0–0.7)
Eosinophils Relative: 0 % (ref 0–5)
HEMATOCRIT: 30 % — AB (ref 36.0–46.0)
Hemoglobin: 9.5 g/dL — ABNORMAL LOW (ref 12.0–15.0)
Lymphocytes Relative: 21 % (ref 12–46)
Lymphs Abs: 2.5 10*3/uL (ref 0.7–4.0)
MCH: 26.7 pg (ref 26.0–34.0)
MCHC: 31.7 g/dL (ref 30.0–36.0)
MCV: 84.3 fL (ref 78.0–100.0)
MONO ABS: 0.6 10*3/uL (ref 0.1–1.0)
MONOS PCT: 5 % (ref 3–12)
NEUTROS ABS: 9.1 10*3/uL — AB (ref 1.7–7.7)
Neutrophils Relative %: 74 % (ref 43–77)
Platelets: 421 10*3/uL — ABNORMAL HIGH (ref 150–400)
RBC: 3.56 MIL/uL — AB (ref 3.87–5.11)
RDW: 19.1 % — ABNORMAL HIGH (ref 11.5–15.5)
WBC: 12.3 10*3/uL — ABNORMAL HIGH (ref 4.0–10.5)

## 2014-03-08 LAB — COMPREHENSIVE METABOLIC PANEL
ALK PHOS: 43 U/L (ref 39–117)
ALT: 12 U/L (ref 0–35)
AST: 14 U/L (ref 0–37)
Albumin: 3.3 g/dL — ABNORMAL LOW (ref 3.5–5.2)
BUN: 8 mg/dL (ref 6–23)
CO2: 24 meq/L (ref 19–32)
CREATININE: 0.67 mg/dL (ref 0.50–1.10)
Calcium: 8.8 mg/dL (ref 8.4–10.5)
Chloride: 105 mEq/L (ref 96–112)
Glucose, Bld: 79 mg/dL (ref 70–99)
Potassium: 3.4 mEq/L — ABNORMAL LOW (ref 3.7–5.3)
Sodium: 142 mEq/L (ref 137–147)
Total Bilirubin: 0.2 mg/dL — ABNORMAL LOW (ref 0.3–1.2)
Total Protein: 6.6 g/dL (ref 6.0–8.3)

## 2014-03-08 LAB — PROTIME-INR
INR: 1.78 — AB (ref 0.00–1.49)
PROTHROMBIN TIME: 20.2 s — AB (ref 11.6–15.2)

## 2014-03-08 MED ORDER — ENOXAPARIN SODIUM 80 MG/0.8ML ~~LOC~~ SOLN
70.0000 mg | Freq: Once | SUBCUTANEOUS | Status: AC
  Administered 2014-03-08: 70 mg via SUBCUTANEOUS
  Filled 2014-03-08: qty 0.8

## 2014-03-08 MED ORDER — ENOXAPARIN SODIUM 100 MG/ML ~~LOC~~ SOLN: 1.0000 mg/kg | Freq: Once | SUBCUTANEOUS | Status: DC

## 2014-03-08 MED ORDER — DIPHENHYDRAMINE HCL 50 MG/ML IJ SOLN
25.0000 mg | Freq: Once | INTRAMUSCULAR | Status: AC
  Administered 2014-03-08: 25 mg via INTRAVENOUS
  Filled 2014-03-08: qty 1

## 2014-03-08 MED ORDER — SODIUM CHLORIDE 0.9 % IV BOLUS (SEPSIS)
1000.0000 mL | INTRAVENOUS | Status: AC
  Administered 2014-03-08: 1000 mL via INTRAVENOUS

## 2014-03-08 MED ORDER — METOCLOPRAMIDE HCL 5 MG/ML IJ SOLN
10.0000 mg | Freq: Once | INTRAMUSCULAR | Status: AC
  Administered 2014-03-08: 10 mg via INTRAVENOUS
  Filled 2014-03-08: qty 2

## 2014-03-08 NOTE — ED Notes (Signed)
Pt reports she is having a bad HA that started about 1 hr ago, pt points to left lateral head where the pain is. Denies falling/loc. sts she was just lying in bed when it started. sts she is on coumadin and has hx of stroke within the past year, left with left sided deficits from that stroke. Pt sts she just wants to make sure everything is ok since she is on coumadin. Nad, skin warm and dry, resp e/u.

## 2014-03-08 NOTE — ED Provider Notes (Signed)
CSN: [id number]     Arrival date & time 03/08/14  9629 History   First MD Initiated Contact with Patient 03/08/14 [phone]     Chief Complaint  Patient presents with  . Headache     HPI  Patient p/w HA. HA began ~4h pta, gradually. The HA is L frontal / temporal w no radiation.  It is sore, severe. No attempts at relief w meds thus far. No clear allev / exacerbating factors. No visual loss, speech difficulty, falls, new weakness. Patient compares the HA to that which she experienced during prior stroke. She is compliant with all meds.   Past Medical History  Diagnosis Date  . PVD (peripheral vascular disease)   . Benign tumor of back   . Swelling, lymph nodes 2006    intermittant, benign  . DVT (deep venous thrombosis)   . Stroke     left sided deficits   Past Surgical History  Procedure Laterality Date  . Ivc filter placement    . Breast surgery      bil breast implants   Family History  Problem Relation Age of Onset  . Cancer - Other Father   . Coronary artery disease Mother   . Stroke Mother   . Hypertension Mother   . Hypertension Sister    History  Substance Use Topics  . Smoking status: Never Smoker   . Smokeless tobacco: Never Used  . Alcohol Use: 3.0 oz/week    5 Glasses of wine per week   OB History   Grav Para Term Preterm Abortions TAB SAB Ect Mult Living                 Review of Systems  Constitutional:       Per HPI, otherwise negative  HENT:       Per HPI, otherwise negative  Respiratory:       Per HPI, otherwise negative  Cardiovascular:       Per HPI, otherwise negative  Gastrointestinal: Negative for vomiting.  Endocrine:       Negative aside from HPI  Genitourinary:       Neg aside from HPI   Musculoskeletal:       Per HPI, otherwise negative  Skin: Negative.   Neurological: Positive for weakness and headaches. Negative for dizziness, tremors, seizures, syncope, facial asymmetry, speech difficulty, light-headedness and numbness.       Allergies  Other and Pork-derived products  Home Medications   Current Outpatient Rx  Name  Route  Sig  Dispense  Refill  . CALCIUM CARBONATE PO   Oral   Take 1,200 mg by mouth 2 (two) times daily.         Marland Kitchen FOLIC ACID PO   Oral   Take 1 tablet by mouth daily.         Marland Kitchen lisinopril (PRINIVIL,ZESTRIL) 20 MG tablet   Oral   Take 20 mg by mouth at bedtime.          . methotrexate (RHEUMATREX) 2.5 MG tablet   Oral   Take 20 mg by mouth once a week. Caution:Chemotherapy. Protect from light.    On Monday         . predniSONE (DELTASONE) 10 MG tablet   Oral   Take 15 mg by mouth daily.         . temazepam (RESTORIL) 15 MG capsule   Oral   Take 15 mg by mouth at bedtime as needed for sleep.          Marland Kitchen  warfarin (COUMADIN) 5 MG tablet   Oral   Take 2.5-5 mg by mouth daily. Sunday, Monday Wednesday and Friday take 2.5mg  and all other days take 5mg           BP 143/76  Temp(Src) 98.2 F (36.8 C) (Oral)  Resp 16  SpO2 99%  LMP 02/24/2014 Physical Exam  Nursing note and vitals reviewed. Constitutional: She is oriented to person, place, and time. She appears well-developed and well-nourished. No distress.  HENT:  Head: Normocephalic and atraumatic.  Eyes: Conjunctivae and EOM are normal. Pupils are equal, round, and reactive to light. Right eye exhibits no discharge. Left eye exhibits no discharge. No scleral icterus.  Neck: Normal range of motion. Neck supple. No tracheal deviation present.  No loss of ROM, no gross deformity   Cardiovascular: Normal rate and regular rhythm.   Pulmonary/Chest: Effort normal and breath sounds normal. No stridor. No respiratory distress.  Abdominal: She exhibits no distension.  Musculoskeletal: She exhibits no edema.  Neurological: She is alert and oriented to person, place, and time. No cranial nerve deficit. She exhibits abnormal muscle tone. Coordination abnormal.  L UE flaccid No facial asym Clear speech, no visual  loss   Skin: Skin is warm and dry.  Psychiatric: She has a normal mood and affect.    ED Course  Procedures (including critical care time) Labs Review Labs Reviewed  PROTIME-INR - Abnormal; Notable for the following:    Prothrombin Time 20.2 (*)    INR 1.78 (*)    All other components within normal limits  CBC WITH DIFFERENTIAL - Abnormal; Notable for the following:    WBC 12.3 (*)    RBC 3.56 (*)    Hemoglobin 9.5 (*)    HCT 30.0 (*)    RDW 19.1 (*)    Platelets 421 (*)    Neutro Abs 9.1 (*)    All other components within normal limits  COMPREHENSIVE METABOLIC PANEL - Abnormal; Notable for the following:    Potassium 3.4 (*)    Albumin 3.3 (*)    Total Bilirubin 0.2 (*)    All other components within normal limits   Imaging Review Ct Head Wo Contrast  03/08/2014   CLINICAL DATA:  Headache, history of CVA  EXAM: CT HEAD WITHOUT CONTRAST  TECHNIQUE: Contiguous axial images were obtained from the base of the skull through the vertex without intravenous contrast.  COMPARISON:  03/26/2013  FINDINGS: No skull fracture is noted. Again noted encephalomalacia in right MCA territory consistent with prior infarct. No definite acute cortical infarction. No mass lesion is noted on this unenhanced scan. Ventricular size is stable from prior exam. Paranasal sinuses and mastoid air cells are unremarkable.  IMPRESSION: No acute intracranial abnormality. No definite acute cortical infarction. Again noted encephalomalacia in right MCA territory consistent with prior infarct.   Electronically Signed   By: Lahoma Crocker M.D.   On: 03/08/2014 11:30    O2- 99%ra, nml  12:18 PM Patient appears more comfortable.  I discussed all findings with her and her mother.  Patient will be discharged to follow up with primary care. Given the low INR, patient will receive Lovenox, will take additional dosing for the next 3 days. MDM  She presents with headache. On exam she is neurovascularly intact,  hemodynamically stable, and in no distress.  Patient has had persistent left upper extremity deficit, this is unchanged.  Patient's evaluation here is largely reassuring.  Given the patient's subtherapeutic INR, additional therapy beyond Lovenox, increased  Coumadin for several days is not indicated.  Patient was discharged in stable condition after her headache resolved as well.    Carmin Muskrat, MD 03/08/14 [phone]

## 2014-03-08 NOTE — Discharge Instructions (Signed)
As discussed it is important that you monitor your condition carefully and follow up with your primary care physician.  Please take a full dose of Coumadin tonight, and Wednesday.  Be sure to have your INR checked again later this week.  Return here for concerning changes in your condition.

## 2014-04-02 ENCOUNTER — Encounter (HOSPITAL_COMMUNITY): Payer: Self-pay | Admitting: Emergency Medicine

## 2014-04-02 DIAGNOSIS — M25569 Pain in unspecified knee: Secondary | ICD-10-CM | POA: Insufficient documentation

## 2014-04-02 DIAGNOSIS — Z8673 Personal history of transient ischemic attack (TIA), and cerebral infarction without residual deficits: Secondary | ICD-10-CM | POA: Insufficient documentation

## 2014-04-02 LAB — CBC
HCT: 32.1 % — ABNORMAL LOW (ref 36.0–46.0)
HEMOGLOBIN: 9.7 g/dL — AB (ref 12.0–15.0)
MCH: 25.3 pg — ABNORMAL LOW (ref 26.0–34.0)
MCHC: 30.2 g/dL (ref 30.0–36.0)
MCV: 83.8 fL (ref 78.0–100.0)
PLATELETS: 424 10*3/uL — AB (ref 150–400)
RBC: 3.83 MIL/uL — ABNORMAL LOW (ref 3.87–5.11)
RDW: 19.1 % — AB (ref 11.5–15.5)
WBC: 11.6 10*3/uL — ABNORMAL HIGH (ref 4.0–10.5)

## 2014-04-02 LAB — BASIC METABOLIC PANEL
BUN: 8 mg/dL (ref 6–23)
CALCIUM: 9.1 mg/dL (ref 8.4–10.5)
CO2: 26 mEq/L (ref 19–32)
Chloride: 101 mEq/L (ref 96–112)
Creatinine, Ser: 0.71 mg/dL (ref 0.50–1.10)
GFR calc non Af Amer: >90 mL/min (ref >90)
GLUCOSE: 119 mg/dL — AB (ref 70–99)
POTASSIUM: 3.9 meq/L (ref 3.7–5.3)
Sodium: 139 mEq/L (ref 137–147)

## 2014-04-02 LAB — PROTIME-INR
INR: 2.18 — AB (ref 0.00–1.49)
PROTHROMBIN TIME: 23.6 s — AB (ref 11.6–15.2)

## 2014-04-02 LAB — APTT: aPTT: 37 seconds (ref 24–37)

## 2014-04-02 NOTE — ED Notes (Signed)
Pt reports 9/10 pain to left knee x 3 days progressively, worse with ambulation. Hx of blood clots, states it feels the same. Pt currently takes Coumadin. Left knee noted to be mildly swollen, warm to touch. NAD. Denies CP, SOB.

## 2014-04-03 ENCOUNTER — Emergency Department (HOSPITAL_COMMUNITY)
Admission: EM | Admit: 2014-04-03 | Discharge: 2014-04-03 | Discharge status: Against Medical Advice | Payer: Medicare Other | Attending: Emergency Medicine | Admitting: Emergency Medicine

## 2014-04-03 NOTE — ED Notes (Signed)
Patient requesting lab results

## 2014-04-03 NOTE — ED Notes (Signed)
No answer when called to go to the room.  Looked thru out the waiting room.

## 2014-04-05 ENCOUNTER — Other Ambulatory Visit: Payer: Self-pay | Admitting: Rheumatology

## 2014-04-05 ENCOUNTER — Ambulatory Visit
Admission: RE | Admit: 2014-04-05 | Discharge: 2014-04-05 | Disposition: A | Discharge status: Home / Self Care | Payer: Medicare Other | Source: Ambulatory Visit | Attending: Rheumatology | Admitting: Rheumatology

## 2014-04-05 DIAGNOSIS — R609 Edema, unspecified: Secondary | ICD-10-CM

## 2014-04-05 DIAGNOSIS — R52 Pain, unspecified: Secondary | ICD-10-CM

## 2014-04-11 ENCOUNTER — Emergency Department (HOSPITAL_COMMUNITY): Payer: Medicare Other

## 2014-04-11 ENCOUNTER — Encounter (HOSPITAL_COMMUNITY): Payer: Self-pay | Admitting: Emergency Medicine

## 2014-04-11 ENCOUNTER — Emergency Department (HOSPITAL_COMMUNITY)
Admission: EM | Admit: 2014-04-11 | Discharge: 2014-04-12 | Disposition: A | Discharge status: Home / Self Care | Payer: Medicare Other | Attending: Emergency Medicine | Admitting: Emergency Medicine

## 2014-04-11 DIAGNOSIS — H3400 Transient retinal artery occlusion, unspecified eye: Secondary | ICD-10-CM | POA: Insufficient documentation

## 2014-04-11 DIAGNOSIS — Z8673 Personal history of transient ischemic attack (TIA), and cerebral infarction without residual deficits: Secondary | ICD-10-CM | POA: Insufficient documentation

## 2014-04-11 DIAGNOSIS — Z79899 Other long term (current) drug therapy: Secondary | ICD-10-CM | POA: Insufficient documentation

## 2014-04-11 DIAGNOSIS — M314 Aortic arch syndrome [Takayasu]: Secondary | ICD-10-CM

## 2014-04-11 DIAGNOSIS — R51 Headache: Secondary | ICD-10-CM | POA: Insufficient documentation

## 2014-04-11 DIAGNOSIS — H5462 Unqualified visual loss, left eye, normal vision right eye: Secondary | ICD-10-CM

## 2014-04-11 DIAGNOSIS — Z7901 Long term (current) use of anticoagulants: Secondary | ICD-10-CM | POA: Insufficient documentation

## 2014-04-11 DIAGNOSIS — Z8679 Personal history of other diseases of the circulatory system: Secondary | ICD-10-CM | POA: Insufficient documentation

## 2014-04-11 DIAGNOSIS — Z86718 Personal history of other venous thrombosis and embolism: Secondary | ICD-10-CM | POA: Insufficient documentation

## 2014-04-11 DIAGNOSIS — IMO0002 Reserved for concepts with insufficient information to code with codable children: Secondary | ICD-10-CM | POA: Insufficient documentation

## 2014-04-11 DIAGNOSIS — H5460 Unqualified visual loss, one eye, unspecified: Secondary | ICD-10-CM

## 2014-04-11 DIAGNOSIS — G453 Amaurosis fugax: Secondary | ICD-10-CM

## 2014-04-11 LAB — DIFFERENTIAL
BASOS ABS: 0 10*3/uL (ref 0.0–0.1)
Basophils Relative: 0 % (ref 0–1)
EOS PCT: 0 % (ref 0–5)
Eosinophils Absolute: 0 10*3/uL (ref 0.0–0.7)
LYMPHS ABS: 1 10*3/uL (ref 0.7–4.0)
LYMPHS PCT: 11 % — AB (ref 12–46)
Monocytes Absolute: 0.5 10*3/uL (ref 0.1–1.0)
Monocytes Relative: 5 % (ref 3–12)
Neutro Abs: 7.8 10*3/uL — ABNORMAL HIGH (ref 1.7–7.7)
Neutrophils Relative %: 84 % — ABNORMAL HIGH (ref 43–77)

## 2014-04-11 LAB — COMPREHENSIVE METABOLIC PANEL
ALT: 25 U/L (ref 0–35)
AST: 22 U/L (ref 0–37)
Albumin: 3.6 g/dL (ref 3.5–5.2)
Alkaline Phosphatase: 49 U/L (ref 39–117)
BUN: 7 mg/dL (ref 6–23)
CALCIUM: 9.9 mg/dL (ref 8.4–10.5)
CO2: 24 mEq/L (ref 19–32)
Chloride: 102 mEq/L (ref 96–112)
Creatinine, Ser: 0.75 mg/dL (ref 0.50–1.10)
GLUCOSE: 100 mg/dL — AB (ref 70–99)
Potassium: 4.3 mEq/L (ref 3.7–5.3)
Sodium: 139 mEq/L (ref 137–147)
Total Bilirubin: 0.3 mg/dL (ref 0.3–1.2)
Total Protein: 7.2 g/dL (ref 6.0–8.3)

## 2014-04-11 LAB — CBC
HCT: 31.3 % — ABNORMAL LOW (ref 36.0–46.0)
Hemoglobin: 9.5 g/dL — ABNORMAL LOW (ref 12.0–15.0)
MCH: 25.5 pg — ABNORMAL LOW (ref 26.0–34.0)
MCHC: 30.4 g/dL (ref 30.0–36.0)
MCV: 83.9 fL (ref 78.0–100.0)
PLATELETS: 473 10*3/uL — AB (ref 150–400)
RBC: 3.73 MIL/uL — AB (ref 3.87–5.11)
RDW: 19.3 % — ABNORMAL HIGH (ref 11.5–15.5)
WBC: 9.3 10*3/uL (ref 4.0–10.5)

## 2014-04-11 LAB — I-STAT TROPONIN, ED: Troponin i, poc: 0 ng/mL (ref 0.00–0.08)

## 2014-04-11 LAB — PROTIME-INR
INR: 1.13 (ref 0.00–1.49)
PROTHROMBIN TIME: 14.3 s (ref 11.6–15.2)

## 2014-04-11 LAB — CBG MONITORING, ED: Glucose-Capillary: 96 mg/dL (ref 70–99)

## 2014-04-11 LAB — APTT: APTT: 26 s (ref 24–37)

## 2014-04-11 MED ORDER — ENOXAPARIN SODIUM 120 MG/0.8ML ~~LOC~~ SOLN
110.0000 mg | Freq: Once | SUBCUTANEOUS | Status: AC
  Administered 2014-04-11: 110 mg via SUBCUTANEOUS
  Filled 2014-04-11: qty 0.8

## 2014-04-11 MED ORDER — IOHEXOL 350 MG/ML SOLN
100.0000 mL | Freq: Once | INTRAVENOUS | Status: AC | PRN
  Administered 2014-04-11: 100 mL via INTRAVENOUS

## 2014-04-11 NOTE — ED Notes (Signed)
Pt c/o sudden headache and left eye blindness onset 5-10 mins PTA. Pt reports currently have vision restored. Pt has history of stroke and was with similar symptoms. 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 Speech clear, no facial droop, right hand strength good.

## 2014-04-11 NOTE — ED Notes (Signed)
Per Dr. Fredric Dine, pt ok to eat and drink if she passes stroke swallow screen. Diet tray to be ordered.

## 2014-04-11 NOTE — ED Provider Notes (Signed)
CSN: [id number]     Arrival date & time 04/11/14  1655 History   First MD Initiated Contact with Patient 04/11/14 1719     Chief Complaint  Patient presents with  . Eye Problem    Patient is a 38 year old female with past medical history as below relevant for prior stroke, DVT, peripheral vascular disease, and currently on anticoagulation with Lovenox or presents with transient right eye blindness. Patient reports around 4:00 she was driving. She suddenly had loss of vision in her right eye. She reports total vision loss. Her symptoms lasted approximately 5-10 minutes and then resolve. Currently her vision is at baseline. Her only other symptoms is a mild diffuse headache. Patient has residual left-sided weakness, and she denies any new numbness, weakness, fevers, or other symptoms.  She recently started Lovenox a week ago per her report as Coumadin was not adequately providing anti  (Consider location/radiation/quality/duration/timing/severity/associated sxs/prior Treatment) Patient is a 38 y.o. female presenting with eye problem. The history is provided by the patient and medical records. No language interpreter was used.  Eye Problem Location:  L eye Severity:  Severe Onset quality:  Sudden Duration:  5 minutes Timing:  Rare Progression:  Resolved Chronicity:  New Associated symptoms: no discharge, no facial rash, no foreign body sensation, no swelling, no vomiting and no weakness   Risk factors: no recent URI     Past Medical History  Diagnosis Date  . PVD (peripheral vascular disease)   . Benign tumor of back   . Swelling, lymph nodes 2006    intermittant, benign  . DVT (deep venous thrombosis)   . Stroke     left sided deficits   Past Surgical History  Procedure Laterality Date  . Ivc filter placement    . Breast surgery      bil breast implants   Family History  Problem Relation Age of Onset  . Cancer - Other Father   . Coronary artery disease Mother   . Stroke  Mother   . Hypertension Mother   . Hypertension Sister    History  Substance Use Topics  . Smoking status: Never Smoker   . Smokeless tobacco: Never Used  . Alcohol Use: 3.0 oz/week    5 Glasses of wine per week   OB History   Grav Para Term Preterm Abortions TAB SAB Ect Mult Living   1         1     Review of Systems  Eyes: Negative for discharge.  Gastrointestinal: Negative for vomiting.  Neurological: Negative for weakness.  All other systems reviewed and are negative.     Allergies  Other and Pork-derived products  Home Medications   Prior to Admission medications   Medication Sig Start Date End Date Taking? Authorizing Provider  CALCIUM CARBONATE PO Take 1,200 mg by mouth 2 (two) times daily.    Historical Provider, MD  FOLIC ACID PO Take 1 tablet by mouth daily.    Historical Provider, MD  lisinopril (PRINIVIL,ZESTRIL) 20 MG tablet Take 20 mg by mouth at bedtime.  02/26/14   Historical Provider, MD  methotrexate (RHEUMATREX) 2.5 MG tablet Take 20 mg by mouth once a week. Caution:Chemotherapy. Protect from light.    On Monday    Historical Provider, MD  predniSONE (DELTASONE) 10 MG tablet Take 15 mg by mouth daily.    Historical Provider, MD  temazepam (RESTORIL) 15 MG capsule Take 15 mg by mouth at bedtime as needed for sleep.  12/07/13  Historical Provider, MD  warfarin (COUMADIN) 5 MG tablet Take 2.5-5 mg by mouth daily. Sunday, Monday Wednesday and Friday take 2.5mg  and all other days take 5mg     Historical Provider, MD   BP 168/87  Pulse 101  Temp(Src) 98.1 F (36.7 C) (Oral)  Resp 16  Ht 5\' 7"  (1.702 m)  Wt 160 lb (72.576 kg)  BMI 25.05 kg/m2  SpO2 100%  LMP 03/31/2014 Physical Exam  Nursing note and vitals reviewed. Constitutional: She is oriented to person, place, and time. She appears well-developed and well-nourished.  HENT:  Head: Normocephalic and atraumatic.  Right Ear: External ear normal.  Mouth/Throat: Oropharynx is clear and moist.  Eyes:  Conjunctivae and EOM are normal. Pupils are equal, round, and reactive to light.  Visual acuity is 20/20 bilaterally   Neck: Normal range of motion. Neck supple.  Cardiovascular: Normal rate and normal heart sounds.   Pulmonary/Chest: Effort normal and breath sounds normal.  Abdominal: Soft. Bowel sounds are normal.  Musculoskeletal: She exhibits no edema and no tenderness.  Neurological: She is alert and oriented to person, place, and time. No cranial nerve deficit. GCS eye subscore is 4. GCS verbal subscore is 5. GCS motor subscore is 6.  Patient with 0/5 strength in LUE which is her baseline - she also has 4/5 strength in LLE.   No sensation to light touch in her LUE.       ED Course  Procedures (including critical care time) Labs Review Labs Reviewed  CBC - Abnormal; Notable for the following:    RBC 3.73 (*)    Hemoglobin 9.5 (*)    HCT 31.3 (*)    MCH 25.5 (*)    RDW 19.3 (*)    Platelets 473 (*)    All other components within normal limits  DIFFERENTIAL - Abnormal; Notable for the following:    Neutrophils Relative % 84 (*)    Neutro Abs 7.8 (*)    Lymphocytes Relative 11 (*)    All other components within normal limits  COMPREHENSIVE METABOLIC PANEL - Abnormal; Notable for the following:    Glucose, Bld 100 (*)    All other components within normal limits  PROTIME-INR  APTT  CBG MONITORING, ED  I-STAT TROPOININ, ED    Imaging Review Ct Angio Head W/cm &/or Wo Cm  04/11/2014   CLINICAL DATA:  Transient vision loss. 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  EXAM: CT ANGIOGRAPHY HEAD AND NECK  TECHNIQUE: Multidetector CT imaging of the head and neck was performed using the standard protocol during bolus administration of intravenous contrast. Multiplanar CT image reconstructions and MIPs were obtained to evaluate the vascular anatomy. Carotid stenosis measurements (when applicable) are obtained utilizing NASCET criteria, using  the distal internal carotid diameter as the denominator.  CONTRAST:  140mL OMNIPAQUE IOHEXOL 350 MG/ML SOLN  COMPARISON:  CT HEAD W/O CM dated 04/11/2014; CT HEAD W/O CM dated 03/08/2014; MR HEAD WO/W CM dated 03/26/2013; MR HEAD W/O CM dated 02/17/2013; CT ANGIO HEAD W/CM &/OR WO/CM dated 02/15/2013; CT ANGIO NECK W/CM &/OR WO/CM dated 02/15/2013  FINDINGS: CTA HEAD FINDINGS  Right petrous, cavernous internal carotid artery appears widely patent. Tapered right carotid terminus, with multiple superimposed tiny collateral vessels, coronal 91/197. Occluded right M1 segment, with filling via the tiny superimposed vessels. High-grade stenosis of the origin the right A1 segment, with slight filling the of the collateral vessels. Of note, the right internal carotid artery was occluded at the  carotid siphon previously, improved on today's examination. Symmetric enhancement of the ophthalmic arteries, however the origins are not well seen.  Retrograde flow into left carotid siphon, robust left posterior communicating artery. Robust right posterior communicating artery is noted as well.  Diminutive right A1 segment, new from prior examination with robust Left A1 segment, patent anterior communicating arteries.  Thready right mid to distal M1 segment, similar to prior examination with overall paucity of distal right middle cerebral artery branches. Normal appearance of the left middle cerebral artery.  Patent right vertebral artery with diminutive left vertebral artery, consistent with reconstitution. Normal appearance the basilar artery main branch vessels. Patent normal appearance of posterior cerebral arteries.  No aneurysm within the anterior nor posterior circulation.  Review of the MIP images confirms the above findings.  Included view of the head on postcontrast imaging demonstrates remote right middle cerebral artery territory infarct. No abnormal parenchymal enhancement, no midline shift, mass effect or hydrocephalus.  CTA  NECK FINDINGS  Similar wall thickening of the aortic arch, with chronically occluded left Common carotid artery. The central filling defect of the innominate artery is no longer present, improved. Chronically occluded left vertebral artery at the arch.  Right Common carotid artery appears widely patent, coursing in a straight line fashion. Widely patent right carotid bifurcation, however there is very slight luminal irregularity which may reflect patient's arteritis. Normal appearance the right internal carotid artery.  Widely patent right vertebral artery, unremarkable. Thready reconstitution of the distal left vertebral artery, likely via muscular branches within the distal intraforaminal segment, unchanged.  Chronically occluded left subclavian artery.  Review of the MIP images confirms the above findings.  IMPRESSION: CTA head: Recanalization of the right internal carotid artery carotid siphon from prior CTA. Occluded distal right supra clinoid internal carotid artery with multiple tiny collateral vessels filling the distal right M1 segment, the mid left A1 segment suggesting early Moya Moya.  Patent ophthalmic arteries.  Overall paucity of distal right middle cerebral artery branches, which may be in part due to decreased demand from remote stroke, and sequelae of arteritis.  If clinical concern for acute ischemia, MRI of the brain with diffusion-weighted sequences would be more sensitive.  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  Slight luminal irregularity of the right internal carotid artery origin which may reflect early arteritis.   Electronically Signed   By: Elon Alas   On: 04/11/2014 23:46   Ct Head Wo Contrast  04/11/2014   CLINICAL DATA:  Headache  EXAM: CT HEAD WITHOUT CONTRAST  TECHNIQUE: Contiguous  axial images were obtained from the base of the skull through the vertex without intravenous contrast.  COMPARISON:  CT HEAD W/O CM dated 03/08/2014  FINDINGS: No acute intracranial abnormality. Specifically, no hemorrhage, hydrocephalus, mass lesion, acute infarction, or significant intracranial injury. No acute calvarial abnormality. Encephalomalacia change in region of right chronic MCA distribution infarction stable visualized paranasal sinuses and mastoid air cells are patent.  IMPRESSION: No acute intracranial abnormality.  Area of encephalomalacic change consistent with chronic right MCA distribution infarction.   Electronically Signed   By: Margaree Mackintosh M.D.   On: 04/11/2014 19:57   Ct Angio Neck W/cm &/or Wo/cm  04/11/2014   CLINICAL DATA:  Transient vision loss. 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  EXAM: CT ANGIOGRAPHY HEAD AND NECK  TECHNIQUE:  Multidetector CT imaging of the head and neck was performed using the standard protocol during bolus administration of intravenous contrast. Multiplanar CT image reconstructions and MIPs were obtained to evaluate the vascular anatomy. Carotid stenosis measurements (when applicable) are obtained utilizing NASCET criteria, using the distal internal carotid diameter as the denominator.  CONTRAST:  192mL OMNIPAQUE IOHEXOL 350 MG/ML SOLN  COMPARISON:  CT HEAD W/O CM dated 04/11/2014; CT HEAD W/O CM dated 03/08/2014; MR HEAD WO/W CM dated 03/26/2013; MR HEAD W/O CM dated 02/17/2013; CT ANGIO HEAD W/CM &/OR WO/CM dated 02/15/2013; CT ANGIO NECK W/CM &/OR WO/CM dated 02/15/2013  FINDINGS: CTA HEAD FINDINGS  Right petrous, cavernous internal carotid artery appears widely patent. Tapered right carotid terminus, with multiple superimposed tiny collateral vessels, coronal 91/197. Occluded right M1 segment, with filling via the tiny superimposed vessels. High-grade stenosis of the origin the right A1 segment, with slight filling the  of the collateral vessels. Of note, the right internal carotid artery was occluded at the carotid siphon previously, improved on today's examination. Symmetric enhancement of the ophthalmic arteries, however the origins are not well seen.  Retrograde flow into left carotid siphon, robust left posterior communicating artery. Robust right posterior communicating artery is noted as well.  Diminutive right A1 segment, new from prior examination with robust Left A1 segment, patent anterior communicating arteries.  Thready right mid to distal M1 segment, similar to prior examination with overall paucity of distal right middle cerebral artery branches. Normal appearance of the left middle cerebral artery.  Patent right vertebral artery with diminutive left vertebral artery, consistent with reconstitution. Normal appearance the basilar artery main branch vessels. Patent normal appearance of posterior cerebral arteries.  No aneurysm within the anterior nor posterior circulation.  Review of the MIP images confirms the above findings.  Included view of the head on postcontrast imaging demonstrates remote right middle cerebral artery territory infarct. No abnormal parenchymal enhancement, no midline shift, mass effect or hydrocephalus.  CTA NECK FINDINGS  Similar wall thickening of the aortic arch, with chronically occluded left Common carotid artery. The central filling defect of the innominate artery is no longer present, improved. Chronically occluded left vertebral artery at the arch.  Right Common carotid artery appears widely patent, coursing in a straight line fashion. Widely patent right carotid bifurcation, however there is very slight luminal irregularity which may reflect patient's arteritis. Normal appearance the right internal carotid artery.  Widely patent right vertebral artery, unremarkable. Thready reconstitution of the distal left vertebral artery, likely via muscular branches within the distal intraforaminal  segment, unchanged.  Chronically occluded left subclavian artery.  Review of the MIP images confirms the above findings.  IMPRESSION: CTA head: Recanalization of the right internal carotid artery carotid siphon from prior CTA. Occluded distal right supra clinoid internal carotid artery with multiple tiny collateral vessels filling the distal right M1 segment, the mid left A1 segment suggesting early Moya Moya.  Patent ophthalmic arteries.  Overall paucity of distal right middle cerebral artery branches, which may be in part due to decreased demand from remote stroke, and sequelae of arteritis.  If clinical concern for acute ischemia, MRI of the brain with diffusion-weighted sequences would be more sensitive.  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  Slight luminal irregularity of the right internal carotid artery origin which may reflect early arteritis.   Electronically Signed   By:  Courtnay  Bloomer   On: 04/11/2014 23:46     EKG Interpretation None        Date: 04/11/2014  Rate: 96  Rhythm: normal sinus rhythm  QRS Axis: normal  Intervals: normal  ST/T Wave abnormalities: early repolarization  Conduction Disutrbances:none  Narrative Interpretation:   Old EKG Reviewed: unchanged    MDM   Final diagnoses:  Amaurosis fugax    Patient presents to the ED with complaints concerning for amaurosis fugax.  AF and VS unremarkable.  PE as above and no new neuro deficits were noted.  Given presentation, ct head, labs, and neurology consult was completed.  CT head w/o shows NAICA, labs unremarkable, and neurology recs included CTA of head and neck.  Patient given home dose of lovenox as she was in ED at time that she normally takes daily dose.  CTA shows no acute findings and neurology recommended discharge with  instructions to continue lovenox and follow up as scheduled.  Patient was in agreement with plan and discharged with unchanged neuro exam.       Corlis Leak, MD 04/12/14 0031  Corlis Leak, MD 04/12/14 [phone]

## 2014-04-11 NOTE — ED Notes (Signed)
Pt transported to CT ?

## 2014-04-11 NOTE — ED Provider Notes (Signed)
I saw and evaluated the patient, reviewed the resident's note and I agree with the findings and plan.   EKG Interpretation   Date/Time:  Sunday Apr 11 2014 17:34:24 EDT Ventricular Rate:  96 PR Interval:  130 QRS Duration: 70 QT Interval:  344 QTC Calculation: 435 R Axis:   64 Text Interpretation:  Sinus rhythm ST elev, probable normal early repol  pattern compared to prior, rate improved. Confirmed by Vanette Noguchi  MD, TREY  ([id number]) on 04/12/2014 12:51:11 PM      37  year old female with a past history of CVA and DVTs presents after a 5-10 minute episode of left eye blindness. The symptoms resolved prior to arrival. On exam, alert, sitting upright in bed, extra ocular motor function intact, all visual fields in both eyes intact, normal respiratory effort, normal perfusion. CT head negative. Plan neurology consult.  Neuro final rec's are dc with outpatient follow up.  Clinical Impression: 1. Amaurosis fugax   2. Takayasu's disease   3. Vision loss of left eye       Houston Siren III, MD 04/12/14 1258

## 2014-04-11 NOTE — ED Notes (Signed)
Neurologist at bedside. 

## 2014-04-11 NOTE — Consult Note (Signed)
Referring Physician: Dr. Fredric Dine    Chief Complaint: Transient loss of vision of left eye.  HPI: [name] is an 38 y.o. female history of previous right cerebral infarction, Takayasu's arteritis, positive lupus anticoagulant and DVT, currently on Lovenox, presenting with transient loss of vision involving left eye for about 5 minutes. She'll similar presentation one year ago. Deficits rapidly resolved. She had no focal motor or sensory changes beyond residual left hemiparesis from previous stroke. There was no abnormality of speech. NIH stroke score at the time of this evaluation was 7 or residual changes from previous stroke. CT scan of her head showed no acute intracranial abnormality. Encephalomalacia from previous right MCA stroke was noted.  LSN:  tPA Given: No: Acute deficits resolved MRankin: 2  Past Medical History  Diagnosis Date  . PVD (peripheral vascular disease)   . Benign tumor of back   . Swelling, lymph nodes 2006    intermittant, benign  . DVT (deep venous thrombosis)   . Stroke     left sided deficits    Family History  Problem Relation Age of Onset  . Cancer - Other Father   . Coronary artery disease Mother   . Stroke Mother   . Hypertension Mother   . Hypertension Sister      Medications: I have reviewed the patient's current medications.  ROS: History obtained from mother and the patient  General ROS: negative for - chills, fatigue, fever, night sweats, weight gain or weight loss Psychological ROS: negative for - behavioral disorder, hallucinations, memory difficulties, mood swings or suicidal ideation Ophthalmic ROS: negative for - blurry vision, double vision, eye pain or loss of vision ENT ROS: negative for - epistaxis, nasal discharge, oral lesions, sore throat, tinnitus or vertigo Allergy and Immunology ROS: negative for - hives or itchy/watery eyes Hematological and Lymphatic ROS: negative for - bleeding problems, bruising or swollen lymph  nodes Endocrine ROS: negative for - galactorrhea, hair pattern changes, polydipsia/polyuria or temperature intolerance Respiratory ROS: negative for - cough, hemoptysis, shortness of breath or wheezing Cardiovascular ROS: negative for - chest pain, dyspnea on exertion, edema or irregular heartbeat Gastrointestinal ROS: negative for - abdominal pain, diarrhea, hematemesis, nausea/vomiting or stool incontinence Genito-Urinary ROS: negative for - dysuria, hematuria, incontinence or urinary frequency/urgency Musculoskeletal ROS: negative for - joint swelling or muscular weakness Neurological ROS: as noted in HPI Dermatological ROS: negative for rash and skin lesion changes  Physical Examination: Blood pressure 133/73, pulse 95, temperature 98.1 F (36.7 C), temperature source Oral, resp. rate 16, height 5\' 7"  (1.702 m), weight 72.576 kg (160 lb), last menstrual period 03/31/2014, SpO2 100.00%.  Neurologic Examination: Mental Status: Alert, oriented, thought content appropriate.  Speech fluent without evidence of aphasia. Able to follow commands without difficulty. Cranial Nerves: II-Visual fields were normal. III/IV/VI-Pupils were equal and reacted. Extraocular movements were full and conjugate.    V/VII-no facial numbness; slight left lower facial weakness. VIII-normal. X-normal speech. Motor: 0/5 strength proximally and distally of left upper extremity; mild to moderate proximal weakness of left lower extremity; normal strength of right upper and lower extremities Sensory: Reduced perception of tactile sensation over left extremities compared to right extremities. Deep Tendon Reflexes: Slightly greater on the left compared to right. Plantars: Extensor on the left and flexor on the right Cerebellar: Normal finger-to-nose testing with use of right upper extremity. Carotid auscultation: Normal  Ct Head Wo Contrast  04/11/2014   CLINICAL DATA:  Headache  EXAM: CT HEAD WITHOUT CONTRAST  TECHNIQUE: Contiguous axial images were obtained from the base of the skull through the vertex without intravenous contrast.  COMPARISON:  CT HEAD W/O CM dated 03/08/2014  FINDINGS: No acute intracranial abnormality. Specifically, no hemorrhage, hydrocephalus, mass lesion, acute infarction, or significant intracranial injury. No acute calvarial abnormality. Encephalomalacia change in region of right chronic MCA distribution infarction stable visualized paranasal sinuses and mastoid air cells are patent.  IMPRESSION: No acute intracranial abnormality.  Area of encephalomalacic change consistent with chronic right MCA distribution infarction.   Electronically Signed   By: Margaree Mackintosh M.D.   On: 04/11/2014 19:57    Assessment: 38 y.o. 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 Patient has known occlusion of the common carotid. Patient is currently on anticoagulation with Lovenox. Current CTA occlusion of left common carotid as well as left vertebral artery, as seen previously. There is retrograde flow into the left carotid siphon.  Stroke Risk Factors - carotid stenosis  Plan: 1. Continue anticoagulation with Lovenox for stroke prevention as well as for DVT management 2. No further neurodiagnostic studies are indicated at this point. 3. Continue followup with primary care physician.  C.R. Nicole Kindred, MD [facility] [phone]  04/11/2014, 9:09 PM

## 2014-04-12 NOTE — Discharge Instructions (Signed)
Amaurosis Fugax Amaurosis fugax is a condition in which a person loses sight in one eye. The loss of vision in the affected eye may be total or partial. It usually lasts just a few seconds or minutes. Then, it returns to normal. Occasionally, it may last for several hours. This is caused by interruption of blood flow to the artery that supplies blood to the retina (lining at the back of the eye, contains nerves needed for sight). The temporary loss of blood flow causes symptoms similar to a stroke. The family of symptoms that happen from a loss of blood flow is called a Transient Ischemic Attack (TIA, mini-stroke). In the case of amaurosis fugax, the eye is the organ that is involved. SYMPTOMS   Painless, sudden loss of vision in one eye.  Visual loss is often from the top down, appearing like a curtain being pulled down over the field of vision.  Rapid return of vision. Vision generally comes back in a few minutes to several hours. CAUSES  TIAs and amaurosis fugax are caused by a loss of blood flow. This can be due to a buildup of cholesterol and fats (plaque) in the arteries or the heart. If some of that plaque comes off the artery and gets into the bloodstream, it can flow to the artery that supplies blood to the retina, blocking the flow of blood to the retina. When that happens, vision is lost for as long as the blood flow is interrupted. Factors that make it more likely you will have amaurosis fugax at some point include:  Smoking.  Poorly controlled diabetes.  High blood pressure.  High cholesterol levels. Medical conditions that may increase the risk of an attack of amaurosis fugax include:  Heart disease.  Diseases of the heart valves.  Certain diseases of the blood (sickle cell anemia, leukemia).  Blood clotting (coagulation) disorders.  Artery inflammation (temporal arteritis, giant cell arteritis). Since amaurosis fugax is an "incomplete stroke," in some people it can be a  sign of an increased risk for an actual stroke. A stroke can result in permanent vision loss or loss of other body functions. As a result, caring for yourself after amaurosis fugax means taking many of the same steps you should take to prevent a stroke. HOME CARE INSTRUCTIONS   Only take over-the-counter or prescription medicines for pain, discomfort, or fever as directed by your caregiver.  Take any medicines that are prescribed for control of your blood pressure and cholesterol levels.  Keep diabetes under control as well as possible.  Stop smoking.  Follow diet instructions, if your caregiver has given them to you.  Try to get at least 30 minutes of moderate physical activity every day. If you have not been active, talk to your caregiver about how to get started. SEEK IMMEDIATE MEDICAL CARE IF:   You lose vision in one or both eyes again, even if only for a short period of time.  You lose vision in one eye and it does not recover within a very brief time (less than 5-10 minutes). The sooner you see an eye specialist (ophthalmologist), the better the chance of regaining some vision, in the case of a central retinal artery occlusion (CRAO, blockage of central retinal artery). However, most cases of CRAO result in some degree of permanent visual loss, even with aggressive treatment.  You have symptoms of a stroke:  Weakness in one side of your body.  Difficulty speaking or thinking clearly.  Lack of coordination. Document  Released: 08/21/2008 Document Revised: 02/04/2012 Document Reviewed: 08/21/2008 °[employer]® Patient Information ©2014 [employer]. ° °

## 2014-04-12 NOTE — ED Provider Notes (Signed)
I saw and evaluated the patient, reviewed the resident's note and I agree with the findings and plan.   EKG Interpretation   Date/Time:  Sunday Apr 11 2014 17:34:24 EDT Ventricular Rate:  96 PR Interval:  130 QRS Duration: 70 QT Interval:  344 QTC Calculation: 435 R Axis:   64 Text Interpretation:  Sinus rhythm ST elev, probable normal early repol  pattern compared to prior, rate improved. Confirmed by [facility]  MD, TREY  [phone]) on 04/12/2014 12:51:11 PM        Houston Siren III, MD 04/12/14 [phone]

## 2014-06-03 ENCOUNTER — Ambulatory Visit: Payer: Medicare Other | Admitting: Physical Medicine & Rehabilitation

## 2014-06-15 ENCOUNTER — Encounter: Payer: Medicare Other | Admitting: Oncology

## 2014-07-19 ENCOUNTER — Telehealth: Payer: Self-pay

## 2014-07-19 NOTE — Telephone Encounter (Signed)
Would need MD re eval, to establish need

## 2014-07-19 NOTE — Telephone Encounter (Signed)
Patient is requesting a referral to OP OT at [facility]. Please advise.

## 2014-07-20 ENCOUNTER — Ambulatory Visit: Payer: Medicare Other | Admitting: Physical Medicine & Rehabilitation

## 2014-07-20 ENCOUNTER — Encounter: Payer: Medicare Other | Attending: Physical Medicine & Rehabilitation

## 2014-07-20 NOTE — Telephone Encounter (Signed)
Attempted to contact patient. Left a voicemail informing patient that she would need a MD appt.

## 2014-08-10 ENCOUNTER — Other Ambulatory Visit: Payer: Self-pay | Admitting: Gastroenterology

## 2014-08-27 ENCOUNTER — Ambulatory Visit (HOSPITAL_COMMUNITY): Admit: 2014-08-27 | Payer: Self-pay | Admitting: Gastroenterology

## 2014-08-27 ENCOUNTER — Encounter (HOSPITAL_COMMUNITY): Payer: Self-pay

## 2014-08-27 SURGERY — ESOPHAGOGASTRODUODENOSCOPY (EGD) WITH PROPOFOL
Anesthesia: Monitor Anesthesia Care

## 2014-09-27 ENCOUNTER — Encounter (HOSPITAL_COMMUNITY): Payer: Self-pay | Admitting: Emergency Medicine

## 2015-04-20 ENCOUNTER — Emergency Department (HOSPITAL_COMMUNITY): Payer: 59

## 2015-04-20 ENCOUNTER — Encounter (HOSPITAL_COMMUNITY): Payer: Self-pay | Admitting: Emergency Medicine

## 2015-04-20 ENCOUNTER — Emergency Department (HOSPITAL_COMMUNITY)
Admission: EM | Admit: 2015-04-20 | Discharge: 2015-04-20 | Disposition: A | Discharge status: Home / Self Care | Payer: 59 | Attending: Emergency Medicine | Admitting: Emergency Medicine

## 2015-04-20 ENCOUNTER — Encounter (HOSPITAL_COMMUNITY): Payer: Self-pay

## 2015-04-20 ENCOUNTER — Emergency Department (HOSPITAL_COMMUNITY)
Admission: EM | Admit: 2015-04-20 | Discharge: 2015-04-21 | Disposition: A | Discharge status: Home / Self Care | Payer: 59 | Attending: Emergency Medicine | Admitting: Emergency Medicine

## 2015-04-20 DIAGNOSIS — S91111A Laceration without foreign body of right great toe without damage to nail, initial encounter: Secondary | ICD-10-CM | POA: Insufficient documentation

## 2015-04-20 DIAGNOSIS — M1990 Unspecified osteoarthritis, unspecified site: Secondary | ICD-10-CM | POA: Diagnosis not present

## 2015-04-20 DIAGNOSIS — W01198A Fall on same level from slipping, tripping and stumbling with subsequent striking against other object, initial encounter: Secondary | ICD-10-CM | POA: Insufficient documentation

## 2015-04-20 DIAGNOSIS — Z8673 Personal history of transient ischemic attack (TIA), and cerebral infarction without residual deficits: Secondary | ICD-10-CM | POA: Diagnosis not present

## 2015-04-20 DIAGNOSIS — Z86018 Personal history of other benign neoplasm: Secondary | ICD-10-CM | POA: Insufficient documentation

## 2015-04-20 DIAGNOSIS — Z79899 Other long term (current) drug therapy: Secondary | ICD-10-CM | POA: Diagnosis not present

## 2015-04-20 DIAGNOSIS — Y929 Unspecified place or not applicable: Secondary | ICD-10-CM | POA: Insufficient documentation

## 2015-04-20 DIAGNOSIS — Z8679 Personal history of other diseases of the circulatory system: Secondary | ICD-10-CM | POA: Insufficient documentation

## 2015-04-20 DIAGNOSIS — M542 Cervicalgia: Secondary | ICD-10-CM | POA: Insufficient documentation

## 2015-04-20 DIAGNOSIS — R079 Chest pain, unspecified: Secondary | ICD-10-CM | POA: Diagnosis not present

## 2015-04-20 DIAGNOSIS — Y998 Other external cause status: Secondary | ICD-10-CM | POA: Insufficient documentation

## 2015-04-20 DIAGNOSIS — Z86718 Personal history of other venous thrombosis and embolism: Secondary | ICD-10-CM | POA: Insufficient documentation

## 2015-04-20 DIAGNOSIS — Y93E1 Activity, personal bathing and showering: Secondary | ICD-10-CM | POA: Diagnosis not present

## 2015-04-20 DIAGNOSIS — S0990XA Unspecified injury of head, initial encounter: Secondary | ICD-10-CM | POA: Diagnosis present

## 2015-04-20 DIAGNOSIS — IMO0002 Reserved for concepts with insufficient information to code with codable children: Secondary | ICD-10-CM

## 2015-04-20 DIAGNOSIS — Z7952 Long term (current) use of systemic steroids: Secondary | ICD-10-CM | POA: Insufficient documentation

## 2015-04-20 DIAGNOSIS — S0083XA Contusion of other part of head, initial encounter: Secondary | ICD-10-CM | POA: Diagnosis not present

## 2015-04-20 DIAGNOSIS — Z7901 Long term (current) use of anticoagulants: Secondary | ICD-10-CM

## 2015-04-20 DIAGNOSIS — R52 Pain, unspecified: Secondary | ICD-10-CM

## 2015-04-20 HISTORY — DX: Unspecified osteoarthritis, unspecified site: M19.90

## 2015-04-20 LAB — CBC WITH DIFFERENTIAL/PLATELET
BASOS PCT: 0 % (ref 0–1)
Basophils Absolute: 0 10*3/uL (ref 0.0–0.1)
EOS ABS: 0 10*3/uL (ref 0.0–0.7)
Eosinophils Relative: 0 % (ref 0–5)
HEMATOCRIT: 38.4 % (ref 36.0–46.0)
HEMOGLOBIN: 12.4 g/dL (ref 12.0–15.0)
LYMPHS ABS: 1.8 10*3/uL (ref 0.7–4.0)
LYMPHS PCT: 17 % (ref 12–46)
MCH: 29.7 pg (ref 26.0–34.0)
MCHC: 32.3 g/dL (ref 30.0–36.0)
MCV: 91.9 fL (ref 78.0–100.0)
MONOS PCT: 6 % (ref 3–12)
Monocytes Absolute: 0.7 10*3/uL (ref 0.1–1.0)
Neutro Abs: 7.8 10*3/uL — ABNORMAL HIGH (ref 1.7–7.7)
Neutrophils Relative %: 77 % (ref 43–77)
PLATELETS: 394 10*3/uL (ref 150–400)
RBC: 4.18 MIL/uL (ref 3.87–5.11)
RDW: 17.1 % — AB (ref 11.5–15.5)
WBC: 10.2 10*3/uL (ref 4.0–10.5)

## 2015-04-20 LAB — I-STAT CHEM 8, ED
BUN: 7 mg/dL (ref 6–20)
CALCIUM ION: 1.17 mmol/L (ref 1.12–1.23)
CHLORIDE: 104 mmol/L (ref 101–111)
Creatinine, Ser: 0.8 mg/dL (ref 0.44–1.00)
GLUCOSE: 95 mg/dL (ref 65–99)
HCT: 41 % (ref 36.0–46.0)
Hemoglobin: 13.9 g/dL (ref 12.0–15.0)
Potassium: 4.6 mmol/L (ref 3.5–5.1)
SODIUM: 140 mmol/L (ref 135–145)
TCO2: 25 mmol/L (ref 0–100)

## 2015-04-20 LAB — I-STAT TROPONIN, ED: Troponin i, poc: 0 ng/mL (ref 0.00–0.08)

## 2015-04-20 MED ORDER — DIPHENHYDRAMINE HCL 50 MG/ML IJ SOLN
25.0000 mg | Freq: Once | INTRAMUSCULAR | Status: AC
  Administered 2015-04-20: 25 mg via INTRAVENOUS
  Filled 2015-04-20: qty 1

## 2015-04-20 MED ORDER — HYDROMORPHONE HCL 1 MG/ML IJ SOLN
1.0000 mg | Freq: Once | INTRAMUSCULAR | Status: AC
  Administered 2015-04-20: 1 mg via INTRAVENOUS
  Filled 2015-04-20: qty 1

## 2015-04-20 MED ORDER — HYDROCODONE-ACETAMINOPHEN 5-325 MG PO TABS: 1.0000 | ORAL_TABLET | Freq: Four times a day (QID) | ORAL | Status: DC | PRN

## 2015-04-20 MED ORDER — ONDANSETRON HCL 4 MG/2ML IJ SOLN
4.0000 mg | Freq: Once | INTRAMUSCULAR | Status: AC
  Administered 2015-04-20: 4 mg via INTRAVENOUS
  Filled 2015-04-20: qty 2

## 2015-04-20 MED ORDER — IOHEXOL 350 MG/ML SOLN
80.0000 mL | Freq: Once | INTRAVENOUS | Status: AC | PRN
  Administered 2015-04-20: 100 mL via INTRAVENOUS

## 2015-04-20 NOTE — ED Provider Notes (Signed)
CSN: [id number]     Arrival date & time 04/20/15  0932 History   First MD Initiated Contact with Patient 04/20/15 [phone]     Chief Complaint  Patient presents with  . Neck Pain     (Consider location/radiation/quality/duration/timing/severity/associated sxs/prior Treatment) Patient is a 39 y.o. female presenting with neck pain. The history is provided by the patient (the pt complains of chest and neck pain).  Neck Pain Pain location:  R side Quality:  Aching Pain radiates to:  Does not radiate Pain severity:  Mild Pain is:  Same all the time Onset quality:  Sudden Timing:  Constant Progression:  Worsening Chronicity:  New Associated symptoms: chest pain   Associated symptoms: no headaches     Past Medical History  Diagnosis Date  . PVD (peripheral vascular disease)   . Benign tumor of back   . Swelling, lymph nodes 2006    intermittant, benign  . DVT (deep venous thrombosis)   . Stroke     left sided deficits  . Arthritis     Takyasu Arthritis    Past Surgical History  Procedure Laterality Date  . Ivc filter placement    . Breast surgery      bil breast implants   Family History  Problem Relation Age of Onset  . Cancer - Other Father   . Coronary artery disease Mother   . Stroke Mother   . Hypertension Mother   . Hypertension Sister    History  Substance Use Topics  . Smoking status: Never Smoker   . Smokeless tobacco: Never Used  . Alcohol Use: No   OB History    Gravida Para Term Preterm AB TAB SAB Ectopic Multiple Living   1         1     Review of Systems  Constitutional: Negative for appetite change and fatigue.  HENT: Negative for congestion, ear discharge and sinus pressure.   Eyes: Negative for discharge.  Respiratory: Negative for cough.   Cardiovascular: Positive for chest pain.  Gastrointestinal: Negative for abdominal pain and diarrhea.  Genitourinary: Negative for frequency and hematuria.  Musculoskeletal: Positive for neck pain.  Negative for back pain.  Skin: Negative for rash.  Neurological: Negative for seizures and headaches.  Psychiatric/Behavioral: Negative for hallucinations.      Allergies  Other and Pork-derived products  Home Medications   Prior to Admission medications   Medication Sig Start Date End Date Taking? Authorizing Provider  CALCIUM CARBONATE-VITAMIN D PO Take 1,200 mg by mouth daily.   Yes Historical Provider, MD  diphenhydrAMINE (SOMINEX) 25 MG tablet Take 50 mg by mouth at bedtime as needed for allergies.   Yes Historical Provider, MD  enoxaparin (LOVENOX) 120 MG/0.8ML injection Inject 120 mg into the skin every evening. 7 pm nightly 04/06/14  Yes Historical Provider, MD  folic acid (FOLVITE) 076 MCG tablet Take 400 mcg by mouth daily.   Yes Historical Provider, MD  lisinopril (PRINIVIL,ZESTRIL) 20 MG tablet Take 10 mg by mouth at bedtime.  02/26/14  Yes Historical Provider, MD  methotrexate (RHEUMATREX) 2.5 MG tablet Take 20 mg by mouth once a week. Caution:Chemotherapy. Protect from light.    On Monday   Yes Historical Provider, MD  predniSONE (DELTASONE) 5 MG tablet Take 7.5 mg by mouth daily. 04/18/15  Yes Historical Provider, MD  traMADol (ULTRAM) 50 MG tablet Take 50 mg by mouth daily as needed (pain).   Yes Historical Provider, MD  HYDROcodone-acetaminophen (NORCO/VICODIN) 5-325 MG per tablet  Take 1 tablet by mouth every 6 (six) hours as needed for moderate pain. 04/20/15   Milton Ferguson, MD   BP 127/72 mmHg  Pulse 67  Temp(Src) 98.8 F (37.1 C) (Oral)  Resp 14  Ht 5\' 8"  (1.727 m)  Wt 173 lb (78.472 kg)  BMI 26.31 kg/m2  SpO2 97%  LMP 04/20/2015 Physical Exam  Constitutional: She is oriented to person, place, and time. She appears well-developed.  HENT:  Head: Normocephalic.  Eyes: Conjunctivae and EOM are normal. No scleral icterus.  Neck: Neck supple. No thyromegaly present.  Cardiovascular: Normal rate and regular rhythm.  Exam reveals no gallop and no friction rub.   No  murmur heard. Pulmonary/Chest: No stridor. She has no wheezes. She has no rales. She exhibits no tenderness.  Abdominal: She exhibits no distension. There is no tenderness. There is no rebound.  Musculoskeletal: Normal range of motion. She exhibits no edema.  Lymphadenopathy:    She has no cervical adenopathy.  Neurological: She is oriented to person, place, and time. She exhibits normal muscle tone. Coordination normal.  Skin: No rash noted. No erythema.  Psychiatric: She has a normal mood and affect. Her behavior is normal.    ED Course  Procedures (including critical care time) Labs Review Labs Reviewed  CBC WITH DIFFERENTIAL/PLATELET - Abnormal; Notable for the following:    RDW 17.1 (*)    Neutro Abs 7.8 (*)    All other components within normal limits  I-STAT CHEM 8, ED  I-STAT TROPOININ, ED    Imaging Review Ct Angio Chest Pe W/cm &/or Wo Cm  04/20/2015   CLINICAL DATA:  Chest pain. History of deep venous thrombosis. History of Takayasu Arteritis  EXAM: CT ANGIOGRAPHY CHEST WITH CONTRAST  TECHNIQUE: Multidetector CT imaging of the chest was performed using the standard protocol during bolus administration of intravenous contrast. Multiplanar CT image reconstructions and MIPs were obtained to evaluate the vascular anatomy.  CONTRAST:  100 mL Omnipaque 350 nonionic  COMPARISON:  Chest CT November 03, 2012; chest radiograph Apr 20, 2015  FINDINGS: There is no demonstrable pulmonary embolus. There is no thoracic aortic aneurysm or dissection.  Marked thickening of the soft tissues surrounding the thoracic aorta remains. Soft tissue thickening between the ascending aorta and main pulmonary outflow tract currently measures 1.5 cm, compared to 2.0 cm on the prior study. No new thickening is seen surrounding the aorta. 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 The right common carotid artery is patent in its visualized regions.   There is mild atelectatic change in the lung bases. There is no lung edema or consolidation. No parenchymal lung mass is appreciable on this study.  There is no appreciable thoracic adenopathy. The pericardium is not thickened. Thyroid appears unremarkable.  There are breast implants bilaterally.  In the visualized upper abdomen, no lesions are identified.  There are no blastic or lytic bone lesions.  Review of the MIP images confirms the above findings.  IMPRESSION: No demonstrable pulmonary embolus. Mild bibasilar atelectatic change without edema or consolidation.  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 Slightly less soft tissue thickening is noted between the ascending aorta and main pulmonary outflow tract on the left compared to prior study. 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 These occlusions were present previously. No new mediastinal lesions are identified. There is no thoracic  aortic aneurysm or dissection.   Electronically Signed   By: Lowella Grip III M.D.   On: 04/20/2015 15:21   Dg Chest Port 1 View  04/20/2015   CLINICAL DATA:  39 year old female with chest pain and right-sided neck pain  EXAM: PORTABLE CHEST - 1 VIEW  COMPARISON:  Prior chest x-ray 02/18/2013  FINDINGS: The lungs are clear and negative for focal airspace consolidation, pulmonary edema or suspicious pulmonary nodule. No pleural effusion or pneumothorax. Cardiac and mediastinal contours are within normal limits. No acute fracture or lytic or blastic osseous lesions. The visualized upper abdominal bowel gas pattern is unremarkable.  IMPRESSION: No active cardiopulmonary disease.   Electronically Signed   By: Jacqulynn Cadet M.D.   On: 04/20/2015 10:38     EKG Interpretation   Date/Time:  Wednesday Apr 20 2015 09:45:05 EDT Ventricular Rate:  94 PR Interval:  136 QRS Duration: 66 QT  Interval:  342 QTC Calculation: 428 R Axis:   64 Text Interpretation:  Sinus rhythm Probable left atrial enlargement  Borderline T abnormalities, anterior leads Confirmed by Dantre Yearwood  MD, Suriya Kovarik  ([id number]) on 04/20/2015 11:31:46 AM      MDM   Final diagnoses:  Pain  Chest pain at rest    Chest pain with nl studies,  Will increase prednisone back to 10 mg a day and give vicodin.  Pt to be follow up next week    Milton Ferguson, MD 04/20/15 [phone]

## 2015-04-20 NOTE — ED Notes (Signed)
Per EMS - pt was seen earlier to day in ED for pain management and prescribed prednisone for some lymph node swelling. Pt has hx of stroke 20yrs ago w/ left-sided deficits (unable to move left hand, some left leg restriction). Pt was in bathtub tonight - lost balance, fell on linoleum floor. Hit head with no LOC. C/o right sided HA - small hematoma on right side with some neck pain (could be r/t lymph nodes and issue seen earlier today). No spine/neck/back pain on palpation. Takes Lovenox. No visual changes. BP 130/80, Sinus Tach - 120bpm, RR 16, pain 6/10 on right side. Pt took vicodin around 2030 this evening.

## 2015-04-20 NOTE — ED Notes (Signed)
Pt c/o right sided neck pain that started this morning around 0200. Neck pain radiates down to right side down to waist. Pt stated that she thought she could have had a reaction to her face wash last night and that her nose was itching so she took 2 benadryl. Pt denies any itching to her nose at this time. Pt stated pain to right side of body is sharp and hurts worse when she takes in a deep breath and talks. Lying back increases the pain as well. Pt has hx of stroke in 2014 and has weakness in left side because of that. Denies any hx of current symptoms.

## 2015-04-20 NOTE — Discharge Instructions (Signed)
Increased prednisone to 10 mg a day.  Follow up with your md next week

## 2015-04-21 MED ORDER — BACITRACIN 500 UNIT/GM EX OINT
1.0000 "application " | TOPICAL_OINTMENT | Freq: Two times a day (BID) | CUTANEOUS | Status: DC
  Administered 2015-04-21: 1 via TOPICAL
  Filled 2015-04-21: qty 0.9

## 2015-04-21 MED ORDER — DIPHENHYDRAMINE HCL 25 MG PO CAPS
50.0000 mg | ORAL_CAPSULE | Freq: Once | ORAL | Status: AC
  Administered 2015-04-21: 50 mg via ORAL
  Filled 2015-04-21: qty 2

## 2015-04-21 NOTE — ED Notes (Signed)
Wound dressed. ?

## 2015-04-21 NOTE — Discharge Instructions (Signed)
Please follow with your primary care doctor in the next 2 days for a check-up. They must obtain records for further management.   Do not hesitate to return to the Emergency Department for any new, worsening or concerning symptoms.   Wash the affected area with soap and water and apply a thin layer of topical antibiotic ointment. Do this every 12 hours.   Do not use rubbing alcohol or hydrogen peroxide.                        Look for signs of infection: if you see redness, if the area becomes warm, if pain increases sharply, there is discharge (pus), if red streaks appear or you develop fever or vomiting, RETURN immediately to the Emergency Department  for a recheck.

## 2015-04-21 NOTE — ED Notes (Signed)
PA at bedside.

## 2015-04-21 NOTE — ED Provider Notes (Signed)
CSN: [id number]     Arrival date & time 04/20/15  2227 History   First MD Initiated Contact with Patient 04/20/15 2248     Chief Complaint  Patient presents with  . Fall     (Consider location/radiation/quality/duration/timing/severity/associated sxs/prior Treatment) HPI   [name] is a 39 y.o. female with past medical history significant for tachycardia osteoarthritis, CVA and DVT, patient is taking Lovenox for anticoagulation. Patient was taking a shower, she has a residual left-sided hemiparesis patient slipped down off her shower seat, there was a mild head trauma with no LOC, nausea, vomiting, change in vision. Patient also reports a mild cervicalgia. Patient denies any other pain or trauma. Sensation and movement is at her baseline.  Past Medical History  Diagnosis Date  . PVD (peripheral vascular disease)   . Benign tumor of back   . Swelling, lymph nodes 2006    intermittant, benign  . DVT (deep venous thrombosis)   . Stroke     left sided deficits  . Arthritis     Takyasu Arthritis    Past Surgical History  Procedure Laterality Date  . Ivc filter placement    . Breast surgery      bil breast implants   Family History  Problem Relation Age of Onset  . Cancer - Other Father   . Coronary artery disease Mother   . Stroke Mother   . Hypertension Mother   . Hypertension Sister    History  Substance Use Topics  . Smoking status: Never Smoker   . Smokeless tobacco: Never Used  . Alcohol Use: No   OB History    Gravida Para Term Preterm AB TAB SAB Ectopic Multiple Living   1         1     Review of Systems  10 systems reviewed and found to be negative, except as noted in the HPI.   Allergies  Other and Pork-derived products  Home Medications   Prior to Admission medications   Medication Sig Start Date End Date Taking? Authorizing Provider  CALCIUM CARBONATE-VITAMIN D PO Take 1,200 mg by mouth daily.    Historical Provider, MD  diphenhydrAMINE  (SOMINEX) 25 MG tablet Take 50 mg by mouth at bedtime as needed for allergies.    Historical Provider, MD  enoxaparin (LOVENOX) 120 MG/0.8ML injection Inject 120 mg into the skin every evening. 7 pm nightly 04/06/14   Historical Provider, MD  folic acid (FOLVITE) 366 MCG tablet Take 400 mcg by mouth daily.    Historical Provider, MD  HYDROcodone-acetaminophen (NORCO/VICODIN) 5-325 MG per tablet Take 1 tablet by mouth every 6 (six) hours as needed for moderate pain. 04/20/15   Milton Ferguson, MD  lisinopril (PRINIVIL,ZESTRIL) 20 MG tablet Take 10 mg by mouth at bedtime.  02/26/14   Historical Provider, MD  methotrexate (RHEUMATREX) 2.5 MG tablet Take 20 mg by mouth once a week. Caution:Chemotherapy. Protect from light.    On Monday    Historical Provider, MD  predniSONE (DELTASONE) 5 MG tablet Take 7.5 mg by mouth daily. 04/18/15   Historical Provider, MD  traMADol (ULTRAM) 50 MG tablet Take 50 mg by mouth daily as needed (pain).    Historical Provider, MD   BP 125/70 mmHg  Pulse 97  Temp(Src) 99.1 F (37.3 C) (Oral)  Resp 24  SpO2 98%  LMP 04/20/2015 Physical Exam  Constitutional: She is oriented to person, place, and time. She appears well-developed and well-nourished.  HENT:  Head: Normocephalic.  Mouth/Throat: Oropharynx is clear and moist.  1cm Right superior parietal contusion.   No hemotympanum, battle signs or raccoon's eyes  No crepitance or tenderness to palpation along the orbital rim.  EOMI intact with no pain or diplopia  No abnormal otorrhea or rhinorrhea. Nasal septum midline.  No intraoral trauma.  Eyes: Conjunctivae and EOM are normal. Pupils are equal, round, and reactive to light.  Neck: Normal range of motion. Neck supple.  Rigid C collar in place. +Midline TTP.    Cardiovascular: Normal rate, regular rhythm and intact distal pulses.   Pulmonary/Chest: Effort normal and breath sounds normal. No respiratory distress. She has no wheezes. She has no rales. She exhibits  no tenderness.  No seatbelt sign, TTP or crepitance  Abdominal: Soft. Bowel sounds are normal. She exhibits no distension and no mass. There is no tenderness. There is no rebound and no guarding.  No Seatbelt Sign  Musculoskeletal: Normal range of motion. She exhibits no edema or tenderness.  Pelvis stable. No deformity or TTP of major joints.   Good ROM  Neurological: She is alert and oriented to person, place, and time.  Patient has a left-sided hemiparesis worse on the upper extremities and lower extremity which patient states is at her baseline.  II-Visual fields grossly intact. III/IV/VI-Extraocular movements intact.  Pupils reactive bilaterally. V/VII-Smile symmetric, equal eyebrow raise,  facial sensation intact VIII- Hearing grossly intact IX/X-Normal gag  XII-midline tongue extension   Skin: Skin is warm.  0.5cm partial-thickness laceration to the right great toe, slight venous oozing  Psychiatric: She has a normal mood and affect.  Nursing note and vitals reviewed.   ED Course  Procedures (including critical care time) Labs Review Labs Reviewed - No data to display  Imaging Review Ct Head Wo Contrast  04/21/2015   CLINICAL DATA:  Post fall hitting head, now with headache.  EXAM: CT HEAD WITHOUT CONTRAST  CT CERVICAL SPINE WITHOUT CONTRAST  TECHNIQUE: Multidetector CT imaging of the head and cervical spine was performed following the standard protocol without intravenous contrast. Multiplanar CT image reconstructions of the cervical spine were also generated.  COMPARISON:  04/11/2014  FINDINGS: CT HEAD FINDINGS  Regional soft tissues appear normal. No radiopaque foreign body. No displaced calvarial fracture.  Geographic encephalomalacia within the anterior aspects of the right parietal lobe, compatible with prior infarction. Unchanged geographic expected dilatation of the right lateral ventricle. Scattered periventricular hypodensities compatible microvascular ischemic  disease. No CT evidence acute large territory infarct. No intraparenchymal or extra-axial mass or hemorrhage. Unchanged size and configuration of the ventricles and basilar cisterns. No midline shift. Limited visualization the paranasal sinuses and mastoid air cells is normal. No air-fluid levels.  CT CERVICAL SPINE FINDINGS  C1 to the superior endplate of T1 is imaged.  The head is held in a minimal amount of left lateral flexion. Normal alignment of the cervical spine. No anterolisthesis or retrolisthesis. The bilateral facets are normally aligned. The dens is normally positioned between the lateral masses of C1. Normal atlantodental and atlantoaxial articulations.  No fracture static subluxation of the cervical spine. Cervical vertebral body heights are preserved. Prevertebral soft tissues are normal.  Intervertebral disc space heights appear preserved.  Regional soft tissues appear normal. No bulky cervical lymphadenopathy on this noncontrast examination. Normal noncontrast appearance of the thyroid gland. Limited visualization of lung apices is normal.  IMPRESSION: 1. Sequela of prior right MCA distribution infarct and microvascular ischemic disease without acute intracranial process. 2. No fracture or static subluxation of  the cervical spine.   Electronically Signed   By: Sandi Mariscal M.D.   On: 04/21/2015 00:59   Ct Angio Chest Pe W/cm &/or Wo Cm  04/20/2015   CLINICAL DATA:  Chest pain. History of deep venous thrombosis. History of Takayasu Arteritis  EXAM: CT ANGIOGRAPHY CHEST WITH CONTRAST  TECHNIQUE: Multidetector CT imaging of the chest was performed using the standard protocol during bolus administration of intravenous contrast. Multiplanar CT image reconstructions and MIPs were obtained to evaluate the vascular anatomy.  CONTRAST:  100 mL Omnipaque 350 nonionic  COMPARISON:  Chest CT November 03, 2012; chest radiograph Apr 20, 2015  FINDINGS: There is no demonstrable pulmonary embolus. There is no  thoracic aortic aneurysm or dissection.  Marked thickening of the soft tissues surrounding the thoracic aorta remains. Soft tissue thickening between the ascending aorta and main pulmonary outflow tract currently measures 1.5 cm, compared to 2.0 cm on the prior study. No new thickening is seen surrounding the aorta. 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 The right common carotid artery is patent in its visualized regions.  There is mild atelectatic change in the lung bases. There is no lung edema or consolidation. No parenchymal lung mass is appreciable on this study.  There is no appreciable thoracic adenopathy. The pericardium is not thickened. Thyroid appears unremarkable.  There are breast implants bilaterally.  In the visualized upper abdomen, no lesions are identified.  There are no blastic or lytic bone lesions.  Review of the MIP images confirms the above findings.  IMPRESSION: No demonstrable pulmonary embolus. Mild bibasilar atelectatic change without edema or consolidation.  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 Slightly less soft tissue thickening is noted between the ascending aorta and main pulmonary outflow tract on the left compared to prior study. 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 These occlusions were present previously. No new mediastinal lesions are identified. There is no thoracic aortic aneurysm or dissection.   Electronically Signed   By: Lowella Grip III M.D.   On: 04/20/2015 15:21   Ct Cervical Spine Wo Contrast  04/21/2015   CLINICAL DATA:  Post fall hitting head, now with headache.  EXAM: CT HEAD WITHOUT CONTRAST  CT CERVICAL SPINE WITHOUT CONTRAST  TECHNIQUE: Multidetector CT imaging of the head and cervical spine was performed following the standard protocol without intravenous  contrast. Multiplanar CT image reconstructions of the cervical spine were also generated.  COMPARISON:  04/11/2014  FINDINGS: CT HEAD FINDINGS  Regional soft tissues appear normal. No radiopaque foreign body. No displaced calvarial fracture.  Geographic encephalomalacia within the anterior aspects of the right parietal lobe, compatible with prior infarction. Unchanged geographic expected dilatation of the right lateral ventricle. Scattered periventricular hypodensities compatible microvascular ischemic disease. No CT evidence acute large territory infarct. No intraparenchymal or extra-axial mass or hemorrhage. Unchanged size and configuration of the ventricles and basilar cisterns. No midline shift. Limited visualization the paranasal sinuses and mastoid air cells is normal. No air-fluid levels.  CT CERVICAL SPINE FINDINGS  C1 to the superior endplate of T1 is imaged.  The head is held in a minimal amount of left lateral flexion. Normal alignment of the cervical spine. No anterolisthesis or retrolisthesis. The bilateral facets are normally aligned. The dens is normally positioned between the lateral masses of C1. Normal atlantodental and atlantoaxial articulations.  No fracture  static subluxation of the cervical spine. Cervical vertebral body heights are preserved. Prevertebral soft tissues are normal.  Intervertebral disc space heights appear preserved.  Regional soft tissues appear normal. No bulky cervical lymphadenopathy on this noncontrast examination. Normal noncontrast appearance of the thyroid gland. Limited visualization of lung apices is normal.  IMPRESSION: 1. Sequela of prior right MCA distribution infarct and microvascular ischemic disease without acute intracranial process. 2. No fracture or static subluxation of the cervical spine.   Electronically Signed   By: Sandi Mariscal M.D.   On: 04/21/2015 00:59   Dg Chest Port 1 View  04/20/2015   CLINICAL DATA:  39 year old female with chest pain and  right-sided neck pain  EXAM: PORTABLE CHEST - 1 VIEW  COMPARISON:  Prior chest x-ray 02/18/2013  FINDINGS: The lungs are clear and negative for focal airspace consolidation, pulmonary edema or suspicious pulmonary nodule. No pleural effusion or pneumothorax. Cardiac and mediastinal contours are within normal limits. No acute fracture or lytic or blastic osseous lesions. The visualized upper abdominal bowel gas pattern is unremarkable.  IMPRESSION: No active cardiopulmonary disease.   Electronically Signed   By: Jacqulynn Cadet M.D.   On: 04/20/2015 10:38     EKG Interpretation None      MDM   Final diagnoses:  Head trauma, initial encounter  Chronic anticoagulation  Laceration    Filed Vitals:   04/21/15 0015 04/21/15 0030 04/21/15 0100 04/21/15 0111  BP: 134/85 129/74 125/70 125/70  Pulse: 116 114 105 97  Temp:      TempSrc:      Resp: 21 20 21 24   SpO2: 98% 98% 98% 98%    Medications  bacitracin ointment 1 application (not administered)  diphenhydrAMINE (BENADRYL) capsule 50 mg (50 mg Oral Given 04/21/15 0050)    [name] is a pleasant 39 y.o. female presenting with head trauma, she is taking Lovenox. Her neuro exam is at her baseline. Patient pain medication and she declines. CT head C-spine are negative. Had an extensive discussion of this patient and her family on the possibility of a delayed bleed in head trauma with anticoagulation, we've discussed red flags to return to the ED or call 911. Patient has a small laceration to her toe, she is unsure when her last tetanus shot is but she declines tetanus here today, states that she follows at the [place] and thinks that she may have had one in the last year. Pressure dressing is applied by RN to toe.   Evaluation does not show pathology that would require ongoing emergent intervention or inpatient treatment. Pt is hemodynamically stable and mentating appropriately. Discussed findings and plan with patient/guardian, who agrees  with care plan. All questions answered. Return precautions discussed and outpatient follow up given.    Monico Blitz, PA-C 04/21/15 0126  Ernestina Patches, MD 04/21/15 [id number]

## 2015-06-30 ENCOUNTER — Emergency Department (HOSPITAL_COMMUNITY): Payer: 59

## 2015-06-30 ENCOUNTER — Encounter (HOSPITAL_COMMUNITY): Payer: Self-pay | Admitting: Emergency Medicine

## 2015-06-30 ENCOUNTER — Observation Stay (HOSPITAL_COMMUNITY)
Admission: EM | Admit: 2015-06-30 | Discharge: 2015-07-01 | Disposition: A | Discharge status: Home / Self Care | Payer: 59 | Attending: Oncology | Admitting: Oncology

## 2015-06-30 DIAGNOSIS — Z7952 Long term (current) use of systemic steroids: Secondary | ICD-10-CM | POA: Insufficient documentation

## 2015-06-30 DIAGNOSIS — M314 Aortic arch syndrome [Takayasu]: Secondary | ICD-10-CM | POA: Diagnosis present

## 2015-06-30 DIAGNOSIS — G453 Amaurosis fugax: Principal | ICD-10-CM | POA: Diagnosis present

## 2015-06-30 DIAGNOSIS — Z7902 Long term (current) use of antithrombotics/antiplatelets: Secondary | ICD-10-CM | POA: Diagnosis not present

## 2015-06-30 DIAGNOSIS — I63412 Cerebral infarction due to embolism of left middle cerebral artery: Secondary | ICD-10-CM

## 2015-06-30 DIAGNOSIS — Z8673 Personal history of transient ischemic attack (TIA), and cerebral infarction without residual deficits: Secondary | ICD-10-CM | POA: Diagnosis present

## 2015-06-30 DIAGNOSIS — Z86718 Personal history of other venous thrombosis and embolism: Secondary | ICD-10-CM | POA: Diagnosis not present

## 2015-06-30 DIAGNOSIS — I10 Essential (primary) hypertension: Secondary | ICD-10-CM | POA: Diagnosis present

## 2015-06-30 DIAGNOSIS — I739 Peripheral vascular disease, unspecified: Secondary | ICD-10-CM | POA: Diagnosis not present

## 2015-06-30 DIAGNOSIS — G4700 Insomnia, unspecified: Secondary | ICD-10-CM | POA: Insufficient documentation

## 2015-06-30 DIAGNOSIS — D509 Iron deficiency anemia, unspecified: Secondary | ICD-10-CM | POA: Diagnosis present

## 2015-06-30 DIAGNOSIS — I69354 Hemiplegia and hemiparesis following cerebral infarction affecting left non-dominant side: Secondary | ICD-10-CM | POA: Diagnosis not present

## 2015-06-30 DIAGNOSIS — Z6830 Body mass index (BMI) 30.0-30.9, adult: Secondary | ICD-10-CM | POA: Diagnosis not present

## 2015-06-30 DIAGNOSIS — E669 Obesity, unspecified: Secondary | ICD-10-CM | POA: Insufficient documentation

## 2015-06-30 DIAGNOSIS — Z5181 Encounter for therapeutic drug level monitoring: Secondary | ICD-10-CM | POA: Diagnosis not present

## 2015-06-30 DIAGNOSIS — Z79899 Other long term (current) drug therapy: Secondary | ICD-10-CM | POA: Diagnosis not present

## 2015-06-30 DIAGNOSIS — E785 Hyperlipidemia, unspecified: Secondary | ICD-10-CM | POA: Insufficient documentation

## 2015-06-30 DIAGNOSIS — G459 Transient cerebral ischemic attack, unspecified: Secondary | ICD-10-CM

## 2015-06-30 DIAGNOSIS — Z7901 Long term (current) use of anticoagulants: Secondary | ICD-10-CM | POA: Insufficient documentation

## 2015-06-30 DIAGNOSIS — Z91018 Allergy to other foods: Secondary | ICD-10-CM | POA: Insufficient documentation

## 2015-06-30 DIAGNOSIS — E876 Hypokalemia: Secondary | ICD-10-CM | POA: Diagnosis not present

## 2015-06-30 DIAGNOSIS — M1380 Other specified arthritis, unspecified site: Secondary | ICD-10-CM | POA: Insufficient documentation

## 2015-06-30 DIAGNOSIS — I749 Embolism and thrombosis of unspecified artery: Secondary | ICD-10-CM | POA: Diagnosis present

## 2015-06-30 HISTORY — DX: Anxiety disorder, unspecified: F41.9

## 2015-06-30 LAB — COMPREHENSIVE METABOLIC PANEL
ALT: 28 U/L (ref 14–54)
AST: 22 U/L (ref 15–41)
Albumin: 3.5 g/dL (ref 3.5–5.0)
Alkaline Phosphatase: 55 U/L (ref 38–126)
Anion gap: 8 (ref 5–15)
BILIRUBIN TOTAL: 0.2 mg/dL — AB (ref 0.3–1.2)
BUN: 6 mg/dL (ref 6–20)
CO2: 26 mmol/L (ref 22–32)
Calcium: 9.4 mg/dL (ref 8.9–10.3)
Chloride: 107 mmol/L (ref 101–111)
Creatinine, Ser: 0.91 mg/dL (ref 0.44–1.00)
Glucose, Bld: 95 mg/dL (ref 65–99)
Potassium: 3.2 mmol/L — ABNORMAL LOW (ref 3.5–5.1)
Sodium: 141 mmol/L (ref 135–145)
TOTAL PROTEIN: 7.3 g/dL (ref 6.5–8.1)

## 2015-06-30 LAB — ETHANOL: Alcohol, Ethyl (B): <5 mg/dL (ref <5)

## 2015-06-30 LAB — I-STAT CHEM 8, ED
BUN: 8 mg/dL (ref 6–20)
CHLORIDE: 106 mmol/L (ref 101–111)
CREATININE: 0.9 mg/dL (ref 0.44–1.00)
Calcium, Ion: 1.2 mmol/L (ref 1.12–1.23)
GLUCOSE: 116 mg/dL — AB (ref 65–99)
HCT: 39 % (ref 36.0–46.0)
Hemoglobin: 13.3 g/dL (ref 12.0–15.0)
POTASSIUM: 3.9 mmol/L (ref 3.5–5.1)
SODIUM: 141 mmol/L (ref 135–145)
TCO2: 25 mmol/L (ref 0–100)

## 2015-06-30 LAB — CBC
HCT: 38.1 % (ref 36.0–46.0)
HEMOGLOBIN: 12 g/dL (ref 12.0–15.0)
MCH: 29.5 pg (ref 26.0–34.0)
MCHC: 31.5 g/dL (ref 30.0–36.0)
MCV: 93.6 fL (ref 78.0–100.0)
Platelets: 482 10*3/uL — ABNORMAL HIGH (ref 150–400)
RBC: 4.07 MIL/uL (ref 3.87–5.11)
RDW: 18.2 % — AB (ref 11.5–15.5)
WBC: 12.1 10*3/uL — ABNORMAL HIGH (ref 4.0–10.5)

## 2015-06-30 LAB — PROTIME-INR
INR: 1.09 (ref 0.00–1.49)
Prothrombin Time: 14.3 seconds (ref 11.6–15.2)

## 2015-06-30 LAB — DIFFERENTIAL
Basophils Absolute: 0 10*3/uL (ref 0.0–0.1)
Basophils Relative: 0 % (ref 0–1)
EOS ABS: 0.1 10*3/uL (ref 0.0–0.7)
Eosinophils Relative: 0 % (ref 0–5)
LYMPHS PCT: 20 % (ref 12–46)
Lymphs Abs: 2.4 10*3/uL (ref 0.7–4.0)
MONOS PCT: 5 % (ref 3–12)
Monocytes Absolute: 0.6 10*3/uL (ref 0.1–1.0)
NEUTROS ABS: 9 10*3/uL — AB (ref 1.7–7.7)
NEUTROS PCT: 75 % (ref 43–77)

## 2015-06-30 LAB — I-STAT TROPONIN, ED: Troponin i, poc: 0 ng/mL (ref 0.00–0.08)

## 2015-06-30 LAB — APTT: aPTT: 29 seconds (ref 24–37)

## 2015-06-30 LAB — MRSA PCR SCREENING: MRSA BY PCR: NEGATIVE

## 2015-06-30 MED ORDER — ENOXAPARIN SODIUM 120 MG/0.8ML ~~LOC~~ SOLN
120.0000 mg | Freq: Every evening | SUBCUTANEOUS | Status: DC
  Administered 2015-06-30: 120 mg via SUBCUTANEOUS
  Filled 2015-06-30 (×2): qty 0.8

## 2015-06-30 MED ORDER — CALCIUM CARBONATE-VITAMIN D 500-200 MG-UNIT PO TABS
1.0000 | ORAL_TABLET | Freq: Every day | ORAL | Status: DC
  Administered 2015-07-01: 1 via ORAL
  Filled 2015-06-30: qty 1

## 2015-06-30 MED ORDER — POTASSIUM CHLORIDE CRYS ER 20 MEQ PO TBCR
40.0000 meq | EXTENDED_RELEASE_TABLET | Freq: Two times a day (BID) | ORAL | Status: DC
  Administered 2015-06-30 – 2015-07-01 (×2): 40 meq via ORAL
  Filled 2015-06-30 (×2): qty 2

## 2015-06-30 MED ORDER — METHOTREXATE 2.5 MG PO TABS: 20.0000 mg | ORAL_TABLET | ORAL | Status: DC

## 2015-06-30 MED ORDER — SODIUM CHLORIDE 0.9 % IJ SOLN
3.0000 mL | Freq: Two times a day (BID) | INTRAMUSCULAR | Status: DC
  Administered 2015-06-30 – 2015-07-01 (×2): 3 mL via INTRAVENOUS

## 2015-06-30 MED ORDER — ADULT MULTIVITAMIN W/MINERALS CH
1.0000 | ORAL_TABLET | Freq: Every day | ORAL | Status: DC
  Administered 2015-07-01: 1 via ORAL
  Filled 2015-06-30 (×2): qty 1

## 2015-06-30 MED ORDER — ASPIRIN 325 MG PO TABS
325.0000 mg | ORAL_TABLET | Freq: Every day | ORAL | Status: DC
  Administered 2015-07-01: 325 mg via ORAL
  Filled 2015-06-30 (×2): qty 1

## 2015-06-30 MED ORDER — POTASSIUM CHLORIDE 20 MEQ PO PACK: 40.0000 meq | PACK | Freq: Two times a day (BID) | ORAL | Status: DC

## 2015-06-30 MED ORDER — TEMAZEPAM 7.5 MG PO CAPS
7.5000 mg | ORAL_CAPSULE | Freq: Every evening | ORAL | Status: DC | PRN
  Administered 2015-06-30: 7.5 mg via ORAL
  Filled 2015-06-30: qty 1

## 2015-06-30 MED ORDER — FOLIC ACID 1 MG PO TABS
1.0000 mg | ORAL_TABLET | Freq: Every day | ORAL | Status: DC
  Administered 2015-07-01: 1 mg via ORAL
  Filled 2015-06-30: qty 1

## 2015-06-30 MED ORDER — STROKE: EARLY STAGES OF RECOVERY BOOK
Freq: Once | Status: AC
  Administered 2015-06-30: 1
  Filled 2015-06-30: qty 1

## 2015-06-30 MED ORDER — PREDNISONE 5 MG PO TABS
7.5000 mg | ORAL_TABLET | Freq: Every day | ORAL | Status: DC
  Administered 2015-07-01: 7.5 mg via ORAL
  Filled 2015-06-30: qty 2

## 2015-06-30 NOTE — Code Documentation (Signed)
39yo female arriving to [facility] via private vehicle at 1141.  Patient reports sudden loss of vision in the left eye.  Patient has a h/o stroke with residual left sided weakness.  Code stroke activated, however, page not received by the Stroke Team.  Patient went to CT and CT results called to Dr. Janann Colonel.  Dr. Janann Colonel and Stroke RN to the bedside.  NIHSS 8, see documentation for details and code stroke times.  Patient with RUQ field cut, however, exam at patient's baseline.  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  Patient is contraindicated for treatment with tPA.  Patient reports that her vision is improving.  No acute stroke treatment at this time.  Bedside handoff with ED RN Pamala Hurry.

## 2015-06-30 NOTE — Progress Notes (Signed)
Pt. Placed on telemetry, box 5W [id number], running SR.  Will continue to monitor.  Alphonzo Lemmings, RN

## 2015-06-30 NOTE — Consult Note (Addendum)
Stroke Consult    Chief Complaint: visual deficits  HPI: [name] is an 39 y.o. female hx of Takayasu's arteritis, multiple prior strokes presenting to ED with transient episode of left visual field cut. Notes acute onset of symptoms at 1130am this morning. Describes a blacking out of the left side of her vision in both eyes, lasted around 15-30 minutes. No associated weakness, sensory deficits, no speech changes. No headache or eye pain. Currently back to baseline.   Reports 3 prior strokes. 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 She takes Lovenox 120mg  nightly for her Takayasu's arteritis. She does not have a neurologist she typically follows with.   Date last known well: 8/04 Time last known well: 1130 tPA Given: no, symptoms resolved, recent high dose of lovenox Modified Rankin: Rankin Score=2  Past Medical History  Diagnosis Date  . PVD (peripheral vascular disease)   . Benign tumor of back   . Swelling, lymph nodes 2006    intermittant, benign  . DVT (deep venous thrombosis)   . Stroke     left sided deficits  . Arthritis     Takyasu Arthritis     Past Surgical History  Procedure Laterality Date  . Ivc filter placement    . Breast surgery      bil breast implants    Family History  Problem Relation Age of Onset  . Cancer - Other Father   . Coronary artery disease Mother   . Stroke Mother   . Hypertension Mother   . Hypertension Sister    Social History:  reports that she has never smoked. She has never used smokeless tobacco. She reports that she does not drink alcohol or use illicit drugs.  Allergies:  Allergies  Allergen Reactions  . Other Other (See Comments)    " I do not eat greens because I take coumadin"  . Pork-Derived Products Other (See Comments)    Does not eat pork at all (agreeable to use Lovenox 08/11/13)     (Not in a hospital admission)  ROS: Out of a complete 14 system review, the patient  complains of only the following symptoms, and all other reviewed systems are negative. + weakness, blurred vision  Physical Examination: Filed Vitals:   06/30/15 1148  Temp: 99.1 F (37.3 C)  Resp: 18   Physical Exam  Constitutional: He appears well-developed and well-nourished.  Psych: Affect appropriate to situation Eyes: No scleral injection HENT: No OP obstrucion Head: Normocephalic.  Cardiovascular: Normal rate and regular rhythm.  Respiratory: Effort normal and breath sounds normal.  GI: Soft. Bowel sounds are normal. No distension. There is no tenderness.  Skin: WDI  Neurologic Examination: Mental Status: Alert, oriented, thought content appropriate. Speech fluent without evidence of aphasia. Able to follow commands without difficulty. Cranial Nerves: II-loss of right superior VF in both eyes III/IV/VI-Pupils were equal and reacted. Extraocular movements were full and conjugate.  V/VII-no facial numbness; slight left lower facial weakness. VIII-normal. X-normal speech. Motor: 0/5 strength proximally and distally of left upper extremity; mild to moderate proximal weakness of left lower extremity; normal strength of right upper and lower extremities Sensory: Reduced perception of tactile sensation over left extremities compared to right extremities. Deep Tendon Reflexes: Slightly greater on the left compared to right. Plantars: Extensor on the left and flexor on the right Cerebellar: Normal finger-to-nose testing with use of right upper extremity.  Laboratory Studies:   Basic Metabolic Panel:  Recent Labs  Lab 06/30/15 1209  NA 141  K 3.9  CL 106  GLUCOSE 116*  BUN 8  CREATININE 0.90    Liver Function Tests: No results for input(s): AST, ALT, ALKPHOS, BILITOT, PROT, ALBUMIN in the last 168 hours. No results for input(s): LIPASE, AMYLASE in the last 168 hours. No results for input(s): AMMONIA in the last 168 hours.  CBC:  Recent Labs Lab  06/30/15 1209  HGB 13.3  HCT 39.0    Cardiac Enzymes: No results for input(s): CKTOTAL, CKMB, CKMBINDEX, TROPONINI in the last 168 hours.  BNP: Invalid input(s): POCBNP  CBG: No results for input(s): GLUCAP in the last 168 hours.  Microbiology: Results for orders placed or performed during the hospital encounter of 03/26/13  MRSA PCR Screening     Status: None   Collection Time: 03/27/13  5:45 AM  Result Value Ref Range Status   MRSA by PCR NEGATIVE NEGATIVE Final    Comment:        The GeneXpert MRSA Assay (FDA approved for NASAL specimens only), is one component of a comprehensive MRSA colonization surveillance program. It is not intended to diagnose MRSA infection nor to guide or monitor treatment for MRSA infections.    Coagulation Studies: No results for input(s): LABPROT, INR in the last 72 hours.  Urinalysis: No results for input(s): COLORURINE, LABSPEC, PHURINE, GLUCOSEU, HGBUR, BILIRUBINUR, KETONESUR, PROTEINUR, UROBILINOGEN, NITRITE, LEUKOCYTESUR in the last 168 hours.  Invalid input(s): APPERANCEUR  Lipid Panel:     Component Value Date/Time   CHOL 151 03/27/2013 0630   TRIG 61 03/27/2013 0630   HDL 60 03/27/2013 0630   CHOLHDL 2.5 03/27/2013 0630   VLDL 12 03/27/2013 0630   LDLCALC 79 03/27/2013 0630    HgbA1C:  Lab Results  Component Value Date   HGBA1C 5.6 03/27/2013    Urine Drug Screen:     Component Value Date/Time   LABOPIA NONE DETECTED 03/27/2013 0247   COCAINSCRNUR NONE DETECTED 03/27/2013 0247   LABBENZ NONE DETECTED 03/27/2013 0247   AMPHETMU NONE DETECTED 03/27/2013 0247   THCU NONE DETECTED 03/27/2013 0247   LABBARB NONE DETECTED 03/27/2013 0247    Alcohol Level: No results for input(s): ETH in the last 168 hours.    Imaging: Ct Head Wo Contrast  06/30/2015   CLINICAL DATA:  Code stroke. History of 3 strokes in 2014. 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 Headaches and dizziness.  EXAM: CT HEAD WITHOUT CONTRAST  TECHNIQUE:  Contiguous axial images were obtained from the base of the skull through the vertex without intravenous contrast.  COMPARISON:  CT of the brain 04/20/2015  FINDINGS: There is encephalomalacia involving the posterior frontal lobe, insula, and basal ganglia. 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 There is no intra or extra-axial fluid collection or mass lesion. The basilar cisterns and ventricles have a normal appearance. There is no CT evidence for acute infarction or hemorrhage. Bone windows show normally aerated paranasal and mastoid air cells. No acute fracture.  IMPRESSION: 1. Changes consistent with previous right MCA distribution infarct. 2.  No evidence for acute intracranial abnormality. 3. Critical Value/emergent results were called by telephone at the time of interpretation on 06/30/2015 at 12:13 pm to Dr. Jim Like, who verbally acknowledged these results.   Electronically Signed   By: Nolon Nations M.D.   On: 06/30/2015 12:14    Assessment: 39 y.o. female hx of Takayasu's arteritis, multiple strokes presenting with transient episode of left visual field  cut. Currently back to baseline. NIHSS of 8 which appears close to her baseline (was 7 at prior visit). Not a tPA candidate due to nightly Lovenox dose. Will admit for stroke workup. Continue high dose nightly lovenox, prednisone and methotrexate  Plan: 1. HgbA1c, fasting lipid panel 2. MRI, MRA  of the brain without contrast 3. PT consult, OT consult, Speech consult 4. Echocardiogram 5. Carotid dopplers 6. Prophylactic therapy-lovenox nightly 7. Risk factor modification 8. Telemetry monitoring 9. Frequent neuro checks 10. NPO until RN stroke swallow screen   Jim Like, DO [facility] [phone]  If 7pm- 7am, please page neurology on call as listed in AMION. 06/30/2015, 12:35 PM

## 2015-06-30 NOTE — ED Provider Notes (Signed)
CSN: [id number]     Arrival date & time 06/30/15  1141 History   First MD Initiated Contact with Patient 06/30/15 1151     Chief Complaint  Patient presents with  . Eye Problem     (Consider location/radiation/quality/duration/timing/severity/associated sxs/prior Treatment) HPI [name] Assessment 39 year old female who presents emergency Department with chief complaint of sudden onset left eye vision loss occurring approximately 45 minutes prior to arrival in the emergency department. A code stroke was called within the first 10 minutes of arrival. 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 Patient states that during 2 of her previous strokes. It began as vision loss. She states this morning she was eating breakfast taking her medication when she had sudden onset of complete vision loss in the left eye which lasted approximately 20 minutes. She states that she has had return of some of her vision but still has peripheral field deficits. She denies headache, new areas of weakness, difficulty with speech or swallowing, vertigo or dizziness. Patient states she takes 120 mg of Lovenox daily at bedtime. 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 Past Medical History  Diagnosis Date  . PVD (peripheral vascular disease)   . Benign tumor of back   . Swelling, lymph nodes 2006    intermittant, benign  . DVT (deep venous thrombosis)   . Stroke     left sided deficits  . Arthritis     Takyasu Arthritis    Past Surgical History  Procedure Laterality Date  . Ivc filter placement    . Breast surgery      bil breast implants   Family History  Problem Relation Age of Onset  . Cancer - Other Father   . Coronary artery disease Mother   . Stroke Mother   . Hypertension Mother   . Hypertension Sister    History  Substance  Use Topics  . Smoking status: Never Smoker   . Smokeless tobacco: Never Used  . Alcohol Use: No   OB History    Gravida Para Term Preterm AB TAB SAB Ectopic Multiple Living   1         1     Review of Systems  Ten systems reviewed and are negative for acute change, except as noted in the HPI.    Allergies  Other and Pork-derived products  Home Medications   Prior to Admission medications   Medication Sig Start Date End Date Taking? Authorizing Provider  CALCIUM CARBONATE-VITAMIN D PO Take 1,200 mg by mouth daily.    Historical Provider, MD  diphenhydrAMINE (SOMINEX) 25 MG tablet Take 50 mg by mouth at bedtime as needed for allergies.    Historical Provider, MD  enoxaparin (LOVENOX) 120 MG/0.8ML injection Inject 120 mg into the skin every evening. 7 pm nightly 04/06/14   Historical Provider, MD  folic acid (FOLVITE) 176 MCG tablet Take 400 mcg by mouth daily.    Historical Provider, MD  HYDROcodone-acetaminophen (NORCO/VICODIN) 5-325 MG per tablet Take 1 tablet by mouth every 6 (six) hours as needed for moderate pain. 04/20/15   Milton Ferguson, MD  lisinopril (PRINIVIL,ZESTRIL) 20 MG tablet Take 10 mg by mouth at bedtime.  02/26/14   Historical Provider, MD  methotrexate (RHEUMATREX) 2.5 MG tablet Take 20 mg by mouth once a week. Caution:Chemotherapy. Protect from light.    On Monday  Historical Provider, MD  predniSONE (DELTASONE) 5 MG tablet Take 7.5 mg by mouth daily. 04/18/15   Historical Provider, MD  traMADol (ULTRAM) 50 MG tablet Take 50 mg by mouth daily as needed (pain).    Historical Provider, MD   Temp(Src) 99.1 F (37.3 C) (Oral)  Resp 18  Ht 5\' 8"  (1.727 m)  Wt 172 lb (78.019 kg)  BMI 26.16 kg/m2  SpO2 100%  LMP 06/17/2015 Physical Exam  Constitutional: She is oriented to person, place, and time. She appears well-developed and well-nourished. No distress.  HENT:  Head: Normocephalic and atraumatic.  Eyes: Conjunctivae are normal. No scleral icterus.  Neck: Normal  range of motion.  Cardiovascular: Normal rate, regular rhythm and normal heart sounds.  Exam reveals no gallop and no friction rub.   No murmur heard. Pulmonary/Chest: Effort normal and breath sounds normal. No respiratory distress.  Abdominal: Soft. Bowel sounds are normal. She exhibits no distension and no mass. There is no tenderness. There is no guarding.  Neurological: She is alert and oriented to person, place, and time. A cranial nerve deficit is present. She exhibits abnormal muscle tone. Coordination abnormal.  Patient with left sided spastic hemiparesis. Right quadrantanopia   Skin: Skin is warm and dry. She is not diaphoretic.    ED Course  Procedures (including critical care time) Labs Review Labs Reviewed  CBC - Abnormal; Notable for the following:    WBC 12.1 (*)    RDW 18.2 (*)    Platelets 482 (*)    All other components within normal limits  DIFFERENTIAL - Abnormal; Notable for the following:    Neutro Abs 9.0 (*)    All other components within normal limits  COMPREHENSIVE METABOLIC PANEL - Abnormal; Notable for the following:    Potassium 3.2 (*)    Total Bilirubin 0.2 (*)    All other components within normal limits  I-STAT CHEM 8, ED - Abnormal; Notable for the following:    Glucose, Bld 116 (*)    All other components within normal limits  ETHANOL  PROTIME-INR  APTT  URINE RAPID DRUG SCREEN, HOSP PERFORMED  URINALYSIS, ROUTINE W REFLEX MICROSCOPIC (NOT AT [facility])  I-STAT TROPOININ, ED    Imaging Review No results found.   EKG Interpretation   Date/Time:  Thursday June 30 2015 12:06:17 EDT Ventricular Rate:  92 PR Interval:  146 QRS Duration: 73 QT Interval:  465 QTC Calculation: 575 R Axis:   66 Text Interpretation:  Sinus rhythm Probable left atrial enlargement  Borderline T abnormalities, anterior leads Minimal ST elevation, inferior  leads Prolonged QT interval No significant change since last tracing  Confirmed by Gerald Leitz  [phone]) on 06/30/2015 12:43:18 PM      MDM   Final diagnoses:  Amaurosis fugax of left eye    Patient seen as code stroke. Unable to admin TPA per Dr. Janann Colonel because of her Lovenox. Patient's visual field deficits have resolved. She has an NIH stroke scale of 8. However, this is because of her previous deficits. Patient will be admitted to the internal medicine teaching service for amaurosis fugax. EKG unchanged. She has a slightly elevated white blood cell count. She is mildly hypokalemic.She appears stable for admission at this time.    Margarita Mail, PA-C 06/30/15 [place], MD 07/01/15 [phone]

## 2015-06-30 NOTE — ED Notes (Signed)
Attempted report 

## 2015-06-30 NOTE — Progress Notes (Signed)
Pt admitted to unit in table condition. Alert and oriented. VSS. Cont neuro checks. Pt states her symptoms have subsided and she feels much better. Tele applied by tele certified RN. Oriented to room and call light. No complaints offered at this time.

## 2015-06-30 NOTE — ED Notes (Signed)
Vision suddenly gone in left eye "a few minutes ago" which is similar to previous three strokes in 2014.

## 2015-06-30 NOTE — H&P (Signed)
Date: 06/30/2015               Patient Name:  [name] MRN: [id number]  DOB: 1976/09/19 Age / Sex: 39 y.o., female   PCP: No Pcp Per Patient         Medical Service: Internal Medicine Teaching Service         Attending Physician: Dr. Annia Belt, MD    First Contact: Sandria Manly MS3 Pager: [phone]  Second Contact: Dr. Joni Reining  Pager: [phone]       After Hours (After 5p/  [name] Pager: [phone]  weekends / holidays): [name]  Pager: [phone]   Chief Complaint: transient amaurosis fugax "stroke"   History of Present Illness: [name] is a pleasant 39 year old woman with PMH of multiple strokes, DVT with IVC filter placement now on lovenox qd , and questionable Takasayu's arteritis,  who presented to the ER with a transient loss of her left vision for approximately 15 minutes before it came back. She says she was at home and her left eye vision went out for 15-20 minutes before regaining it.  She realized that this was a similar presentation that happened last time so she called her husband and came here. There were no associated symptoms of lightheadedness, facial drooping, slurring of speech, weakness, new sensory deficits, numbness or eye pain. She had some headache, but denied nausea, vomiting, chest pain,. She does feel some shortness of breath when she walks up the steps but that has been an ongoing issue. Her LMP was 5 days back.   Regarding her DVT- 1st occurrence was at age 82, associated with OCP use, then in 1995 had she was on heparin for 9 months during a pregnancy, and intermittently she continued to have recurrent blood clots and had an IVC filter placed in 2008.  Records show that she has not been quite compliant with anticoags in the past. She had lupus anticoagulant positivity, but protein C, S and antithrombin, factor 5 leiden, cardiolipin, and homocysteine workup has been normal in 2014.  Regarding her TIA/stroke, there were 3 prior  strokes. The first one was in Jan 2014, which resulted in transient loss of vision in one of the eyes, which happened while she was on warfarin. Then in Mar 2014, another stroke happened, which resulted in weakness of her left arm and legs, then she was put on Xarelto. In May 2014, another episode of left vision loss occurred and she was switched to coumadin.  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 Joan Flores at [facility] for hematology.   Regarding her Takasayu's- it was assumed  in 2006 by Dr. Ouida Sills, and a lung/ lymph node biopsy was done and she is on MTX and prednisone 10mg  daily. The biopsy itself was unclear as there was no evidence of vasculitis, but inflammatory infiltrate was present. The case was forwarded to another Rheumatologist who agreed that the material was not diagnostic for Takayasu     Meds: .meds No current facility-administered medications for this encounter.   Scheduled Meds: .  stroke: mapping our early stages of recovery book   Does not apply Once  . calcium-vitamin D  1 tablet Oral Daily  . enoxaparin  120 mg Subcutaneous QPM  . folic acid  1 mg Oral Daily  . methotrexate  20 mg Oral Weekly  . multivitamin  1 tablet  Oral Daily  . potassium chloride  40 mEq Oral BID  . predniSONE  7.5 mg Oral Daily  . sodium chloride  3 mL Intravenous Q12H   Continuous Infusions:  PRN Meds:.  Allergies: Allergies as of 06/30/2015 - Review Complete 06/30/2015  Allergen Reaction Noted  . Other Other (See Comments) 03/08/2014  . Pork-derived products Other (See Comments) 02/23/2013   Past Medical History  Diagnosis Date  . PVD (peripheral vascular disease)   . Benign tumor of back   . Swelling, lymph nodes 2006    intermittant, benign  . DVT (deep venous thrombosis)   . Stroke     left sided deficits  . Arthritis     Takyasu Arthritis    Past Surgical History  Procedure Laterality Date  . Ivc filter  placement    . Breast surgery      bil breast implants   Family History  Problem Relation Age of Onset  . Cancer - Other Father   . Coronary artery disease Mother   . Stroke Mother   . Hypertension Mother   . Hypertension Sister    History   Social History  . Marital Status: Legally Separated    Spouse Name: N/A  . Number of Children: N/A  . Years of Education: N/A   Occupational History  . Not on file.   Social History Main Topics  . Smoking status: Never Smoker   . Smokeless tobacco: Never Used  . Alcohol Use: No  . Drug Use: No  . Sexual Activity: Not on file   Other Topics Concern  . Not on file   Social History Narrative    Review of Systems: A complete ROS was obtained and the pertinent is listed in the HPI   Physical Exam: Blood pressure 125/68, pulse 79, temperature 98.4 F (36.9 C), temperature source Oral, resp. rate 18, height 5\' 4"  (1.626 m), weight 177 lb (80.287 kg), last menstrual period 06/17/2015, SpO2 99 %.  General: A&O, in NAD, lying in bed  HEENT: EOMI, PERRLA, sclera white, conjunctiva pink, no ear/nose/throat erythema Neck: supple, midline trachea, no cervical LAD, thyroid nonpalpable CV: RRR, normal s1, s2, no m/r/g, no carotid bruits appreciated, Resp: equal and symmetric breath sounds, no wheezing heard Abdomen: soft, nontender, nondistended, +BS in all 4 quadrants, no bruits appreciated, GU: no CVA tenderness Skin: warm, dry, intact, no open lesions or rashes noted Extremities: dp, pt, and popliteal pulses intact b/l, no edema, clubbing or cyanosis, no calf tenderness  Neurologic:  CN II-XII grossly intact CNII- VF were intact in both eyes, vision about 20/40 in both eyes III, IV, VI: PERRLA, EOM intact V, VII: normal facial sensation VIII:hearing intact IX/X: symmetric palate elevation, swallowing ok,  XI: normal    Sensory: intact to light touch in all extremities Motor: 5/5 strength in RUE and both lower extremities, left  arm and hand 0/5 DTRs: hyperreflexia on left UE and LE, normal on RUE and RLE Abbnormal myuscle tone on left side- Cerebellar: finger to nose test intact,  Psyc: pleasant personality, affect appropriate to mood, no focal deficits    Lab results: @LABTEST @ CBC Latest Ref Rng 06/30/2015 06/30/2015 04/20/2015  WBC 4.0 - 10.5 K/uL 12.1(H) - -  Hemoglobin 12.0 - 15.0 g/dL 12.0 13.3 13.9  Hematocrit 36.0 - 46.0 % 38.1 39.0 41.0  Platelets 150 - 400 K/uL 482(H) - -    BMP Latest Ref Rng 06/30/2015 06/30/2015 04/20/2015  Glucose 65 - 99 mg/dL 95 116(H) 95  BUN 6 - 20 mg/dL 6 8 7   Creatinine 0.44 - 1.00 mg/dL 0.91 0.90 0.80  Sodium 135 - 145 mmol/L 141 141 140  Potassium 3.5 - 5.1 mmol/L 3.2(L) 3.9 4.6  Chloride 101 - 111 mmol/L 107 106 104  CO2 22 - 32 mmol/L 26 - -  Calcium 8.9 - 10.3 mg/dL 9.4 - -    Troponin (Point of Care Test)  Recent Labs  06/30/15 1207  TROPIPOC 0.00   Drugs of Abuse     Component Value Date/Time   LABOPIA NONE DETECTED 03/27/2013 0247   COCAINSCRNUR NONE DETECTED 03/27/2013 0247   LABBENZ NONE DETECTED 03/27/2013 0247   AMPHETMU NONE DETECTED 03/27/2013 0247   THCU NONE DETECTED 03/27/2013 0247   LABBARB NONE DETECTED 03/27/2013 0247       Imaging results:  Ct Head Wo Contrast  06/30/2015   CLINICAL DATA:  Code stroke. History of 3 strokes in 2014. 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 Headaches and dizziness.  EXAM: CT HEAD WITHOUT CONTRAST  TECHNIQUE: Contiguous axial images were obtained from the base of the skull through the vertex without intravenous contrast.  COMPARISON:  CT of the brain 04/20/2015  FINDINGS: There is encephalomalacia involving the posterior frontal lobe, insula, and basal ganglia. 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 There is no intra or extra-axial fluid collection or mass lesion. The basilar cisterns and ventricles have a normal appearance. There is no CT evidence for acute infarction or  hemorrhage. Bone windows show normally aerated paranasal and mastoid air cells. No acute fracture.  IMPRESSION: 1. Changes consistent with previous right MCA distribution infarct. 2.  No evidence for acute intracranial abnormality. 3. Critical Value/emergent results were called by telephone at the time of interpretation on 06/30/2015 at 12:13 pm to Dr. Jim Like, who verbally acknowledged these results.   Electronically Signed   By: Nolon Nations M.D.   On: 06/30/2015 12:14    Other results: EKG: normal EKG, normal sinus rhythm, unchanged from previous tracings.  Assessment & Plan by Problem: Active Problems:   Amaurosis fugax of left eye 39 year old woman with history of previous strokes and h/o transient loss of vision, h/o DVT requiring IVC filter, and questionable history of Takasayu arteritis, presents with a 15 minute transient loss of vision in her left eye which has since returned to baseline- likely acute TIA. She is admitted for stroke workup.   Amaurosis Fugax involving left eye: Incident lasted 20 minutes without any other deficits consistent with TIA. likely because of the similar presentation in previous times per patient. 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 CT head shows changes consistent with previous right MCA distribution infarct , but no evidence for acute intracranial abnormality.  Patients with Takayasus present with late manifestations of visual impairments- due to cerebral ischemia , might also be related to carotid artery  -Unsure whether this is arterial or venous thrombosis. Pt failed multiple anticoag therapy in the past. -on tele -- Ordered MRI, MRA of the brain without contrast -Echo and carotid dopplers -therapeutic Lovenox -telemetry and neuro checks  -Consult PT, OT, and speech. - on aspirin 325 mg today, then 1 tomorrow  -may want to get optho involved  Dvt: 1st  occurrence was at age 22, associated with OCP use, then in 1995 had she was on heparin for 9 months during  a pregnancy, and intermittently she continued to have recurrent blood clots and had an IVC filter placed in 2008.  Records show that she has not been quite compliant with anticoags in the past. She had lupus anticoagulant positivity, but protein C, S and antithrombin, factor 5 leiden, cardiolipin, and homocysteine workup has been normal in 2014.  On enoxaparin sq 120 mg - of note she was in the ER on 5/26 for neck pain and had a CT angio which was negative for PE  Hypokalemia: on 40 meq K cl  Vasculitis/ Takayasu?: not confirmed by biopsy. Steroids and HTX are modalities of treatment. 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  -on MTX and prednisone - on folic acid and vit d  Insomnia- temazepam  #FEN:  -Diet: carb modified  #DVT prophylaxis: enoxaparin  #CODE STATUS: full code      Dispo: Disposition is deferred at this time, awaiting improvement of current medical problems. Anticipated discharge in approximately 1 day(s).   The patient does have a current PCP (No Pcp Per Patient) and does need an [facility] hospital follow-up appointment after discharge.  The patient does not have transportation limitations that hinder transportation to clinic appointments.  Signed: Burgess Estelle, MD 06/30/2015, 4:09 PM

## 2015-06-30 NOTE — H&P (Signed)
Date: 06/30/2015               Patient Name:  [name] MRN: [id number]  DOB: [birth year] Age / Sex: 39 y.o., female   PCP: No Pcp Per Patient              Medical Service: Internal Medicine Teaching Service              Attending Physician: Dr. Annia Belt, MD    First Contact: Sandria Manly, MS 3 Pager: [phone]  Second Contact: Dr. Burgess Estelle Pager: [phone]       After Hours (After 5p/  First Contact Pager: [phone]  weekends / holidays): Second Contact Pager: [phone]   Chief Complaint: Sudden loss of vision in left eye  History of Present Illness: [name] is a 39 y.o. 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 She stated that the episode started at about 11:00 this morning and lasted about 15 minutes before returning. She noted that this is the same symptoms that occurred during her previous strokes, which prompted her to come to the ED. She endorsed slight headache at the same time. She denied any dizziness, lightheadedness, additional weakness, slurred speech, or syncope during this episode. She also denied any heart palpitations or arrhythmias. No difficulty breathing or shortness of breath. She denied having had any swelling in her leg or clots recently. She's currently on Lovenox nightly and has been compliant. She is also on prednisone and methotrexate for her Takayasu's arteritis.  Meds: Current Facility-Administered Medications  Medication Dose Route Frequency Provider Last Rate Last Dose  .  stroke: mapping our early stages of recovery book   Does not apply Once Ejiroghene E Denton Brick, MD      . calcium-vitamin D (OSCAL WITH D) 500-200 MG-UNIT per tablet 1 tablet  1 tablet Oral Daily Ejiroghene E Emokpae, MD      . enoxaparin (LOVENOX) injection 120 mg  120 mg Subcutaneous QPM Ejiroghene E Emokpae, MD       . folic acid (FOLVITE) tablet 1 mg  1 mg Oral Daily Ejiroghene E Emokpae, MD      . methotrexate (RHEUMATREX) tablet 20 mg  20 mg Oral Weekly Ejiroghene E Emokpae, MD      . multivitamin tablet 1 tablet  1 tablet Oral Daily Ejiroghene E Emokpae, MD      . potassium chloride (KLOR-CON) packet 40 mEq  40 mEq Oral BID Ejiroghene E Emokpae, MD      . predniSONE (DELTASONE) tablet 7.5 mg  7.5 mg Oral Daily Ejiroghene E Emokpae, MD      . sodium chloride 0.9 % injection 3 mL  3 mL Intravenous Q12H Ejiroghene Arlyce Dice, MD        Allergies: Allergies as of 06/30/2015 - Review Complete 06/30/2015  Allergen Reaction Noted  . Other Other (See Comments) 03/08/2014  . Pork-derived products Other (See Comments) 02/23/2013   Past Medical History  Diagnosis Date  . PVD (peripheral vascular disease)   . Benign tumor of back   . Swelling, lymph nodes 2006    intermittant, benign  . DVT (deep venous thrombosis)   . Stroke     left sided deficits  . Arthritis     Takyasu Arthritis   . Anxiety    Past Surgical History  Procedure Laterality Date  . Ivc filter placement    . Breast surgery      bil breast implants   Family History  Problem Relation Age of Onset  . Cancer - Other Father   . Coronary artery disease Mother   . Stroke Mother   . Hypertension Mother   . Hypertension Sister    History   Social History  . Marital Status: Legally Separated    Spouse Name: N/A  . Number of Children: N/A  . Years of Education: N/A   Occupational History  . Not on file.   Social History Main Topics  . Smoking status: Never Smoker   . Smokeless tobacco: Never Used  . Alcohol Use: No  . Drug Use: No  . Sexual Activity: Not on file   Other Topics Concern  . Not on file   Social History Narrative    Review of Systems: General: No fevers or chills HEENT: Endorses slight headache at time of event Pulmonary: Denies difficulty breathing or shortness of breath CV: No palpitations, no  irregular heartbeats GI: No nausea OB/GYN: Last menstrual cycle ended a few weeks ago Neuro: Denies dizziness or lightheadedness, no new onset weakness MSK: No aches, endorses weakness in left arm and leg which is baseline Skin: No rashes, mentioned that she "breaks out" really badly when someone touches her face  Physical Exam: Blood pressure 125/68, pulse 79, temperature 98.4 F (36.9 C), temperature source Oral, resp. rate 18, height 5\' 4"  (1.626 m), weight 80.287 kg (177 lb), last menstrual period 06/17/2015, SpO2 99 %. BP 125/68 mmHg  Pulse 79  Temp(Src) 98.4 F (36.9 C) (Oral)  Resp 18  Ht 5\' 4"  (1.626 m)  Wt 80.287 kg (177 lb)  BMI 30.37 kg/m2  SpO2 99%  LMP 06/17/2015  General Appearance:    Alert, cooperative, no distress, appears stated age  Head:    Normocephalic, atraumatic, slight L facial droop  Eyes:    PERRL, conjunctiva/corneas clear, EOM's intact,  Ears:    Normal external ear canals, both ears  Nose:   Nares normal, no drainage  Throat:   Lips, mucosa, and tongue normal; teeth and gums normal  Back:     Symmetric, no curvature, ROM normal  Lungs:     Clear to auscultation bilaterally, respirations unlabored  Chest Wall:    No tenderness or deformity   Heart:    Regular rate and rhythm, S1 and S2 normal, no murmur, rub   or gallop  Abdomen:     Soft, non-tender, bowel sounds active all four quadrants,    no masses, no organomegaly  Extremities:   Extremities atraumatic, Left arm and leg weakness, no edema  Pulses:   2+ and symmetric all extremities  Skin:   Skin color, texture, turgor normal, no rashes or lesions  Neurologic:   CNII-X, XII intact, CNXI showed weakness on shrug, normal strength, sensation and reflexes throughout    Lab results: Results for orders placed or performed during the hospital encounter of 06/30/15 (from the past 24 hour(s))  I-stat troponin, ED (not at [facility], [facility])     Status: None   Collection Time: 06/30/15 12:07 PM  Result Value  Ref Range   Troponin i, poc 0.00 0.00 - 0.08 ng/mL   Comment 3          I-Stat Chem 8, ED  (not at [facility], [facility])     Status: Abnormal   Collection Time: 06/30/15 12:09 PM  Result Value  Ref Range   Sodium 141 135 - 145 mmol/L   Potassium 3.9 3.5 - 5.1 mmol/L   Chloride 106 101 - 111 mmol/L   BUN 8 6 - 20 mg/dL   Creatinine, Ser 0.90 0.44 - 1.00 mg/dL   Glucose, Bld 116 (H) 65 - 99 mg/dL   Calcium, Ion 1.20 1.12 - 1.23 mmol/L   TCO2 25 0 - 100 mmol/L   Hemoglobin 13.3 12.0 - 15.0 g/dL   HCT 39.0 36.0 - 46.0 %  Ethanol     Status: None   Collection Time: 06/30/15 12:42 PM  Result Value Ref Range   Alcohol, Ethyl (B) <5 <5 mg/dL  Protime-INR     Status: None   Collection Time: 06/30/15 12:48 PM  Result Value Ref Range   Prothrombin Time 14.3 11.6 - 15.2 seconds   INR 1.09 0.00 - 1.49  APTT     Status: None   Collection Time: 06/30/15 12:48 PM  Result Value Ref Range   aPTT 29 24 - 37 seconds  CBC     Status: Abnormal   Collection Time: 06/30/15 12:48 PM  Result Value Ref Range   WBC 12.1 (H) 4.0 - 10.5 K/uL   RBC 4.07 3.87 - 5.11 MIL/uL   Hemoglobin 12.0 12.0 - 15.0 g/dL   HCT 38.1 36.0 - 46.0 %   MCV 93.6 78.0 - 100.0 fL   MCH 29.5 26.0 - 34.0 pg   MCHC 31.5 30.0 - 36.0 g/dL   RDW 18.2 (H) 11.5 - 15.5 %   Platelets 482 (H) 150 - 400 K/uL  Differential     Status: Abnormal   Collection Time: 06/30/15 12:48 PM  Result Value Ref Range   Neutrophils Relative % 75 43 - 77 %   Neutro Abs 9.0 (H) 1.7 - 7.7 K/uL   Lymphocytes Relative 20 12 - 46 %   Lymphs Abs 2.4 0.7 - 4.0 K/uL   Monocytes Relative 5 3 - 12 %   Monocytes Absolute 0.6 0.1 - 1.0 K/uL   Eosinophils Relative 0 0 - 5 %   Eosinophils Absolute 0.1 0.0 - 0.7 K/uL   Basophils Relative 0 0 - 1 %   Basophils Absolute 0.0 0.0 - 0.1 K/uL  Comprehensive metabolic panel     Status: Abnormal   Collection Time: 06/30/15 12:48 PM  Result Value Ref Range   Sodium 141 135 - 145 mmol/L   Potassium 3.2 (L) 3.5 - 5.1 mmol/L    Chloride 107 101 - 111 mmol/L   CO2 26 22 - 32 mmol/L   Glucose, Bld 95 65 - 99 mg/dL   BUN 6 6 - 20 mg/dL   Creatinine, Ser 0.91 0.44 - 1.00 mg/dL   Calcium 9.4 8.9 - 10.3 mg/dL   Total Protein 7.3 6.5 - 8.1 g/dL   Albumin 3.5 3.5 - 5.0 g/dL   AST 22 15 - 41 U/L   ALT 28 14 - 54 U/L   Alkaline Phosphatase 55 38 - 126 U/L   Total Bilirubin 0.2 (L) 0.3 - 1.2 mg/dL   GFR calc non Af Amer >60 >60 mL/min   GFR calc Af Amer >60 >60 mL/min   Anion gap 8 5 - 15   Imaging results:  Ct Head Wo Contrast  06/30/2015   CLINICAL DATA:  Code stroke. History of 3 strokes in 2014. 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 Headaches and dizziness.  EXAM: CT HEAD WITHOUT CONTRAST  TECHNIQUE: Contiguous axial images were obtained from  the base of the skull through the vertex without intravenous contrast.  COMPARISON:  CT of the brain 04/20/2015  FINDINGS: There is encephalomalacia involving the posterior frontal lobe, insula, and basal ganglia. 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 There is no intra or extra-axial fluid collection or mass lesion. The basilar cisterns and ventricles have a normal appearance. There is no CT evidence for acute infarction or hemorrhage. Bone windows show normally aerated paranasal and mastoid air cells. No acute fracture.  IMPRESSION: 1. Changes consistent with previous right MCA distribution infarct. 2.  No evidence for acute intracranial abnormality. 3. Critical Value/emergent results were called by telephone at the time of interpretation on 06/30/2015 at 12:13 pm to Dr. Jim Like, who verbally acknowledged these results.   Electronically Signed   By: Nolon Nations M.D.   On: 06/30/2015 12:14    Other results: EKG: unchanged from previous tracings, normal sinus rhythm, nonspecific ST and T waves changes.  Assessment & Plan by Problem: Active Problems:   Amaurosis fugax of left eye  Visual loss in Left eye: Given the patient's  account that this symptoms is very much similar to her previous stroke sequelae and in the context of her hypercoagulable history, this presentation seems most consistent with a TIA vs CVA and warrants further workup. Other things to consider on the differential would include SLE with antiphospholipid syndrome given her hypercoagulable state and risk factors (younger [place] American female), though previous ANA studies have been negative. Additionally we would want to consider a treatment refractory Takayasu's being the culprit and restricting cerebral bloodflow to the opthalmic artery, which would explain her transient amaurosis fugax. - CT negative for acute bleed or new findings - Carotid dopplers to check for source of embolus - Echocardiogram to assess cardiac function, potential source of embolus - MRI, MRA of the brain, no contrast - PT/OT consult for evaluation of baseline weakness - Neuro checks every 2 hours  Takayasu's arteritis: This diagnosis was made in 2006 after her presentation with chest and back pain as well as shortness of breath. A mediastinal mass was seen on imaging and a biopsy was obtained due to concern for malignancy. After malignancy was ruled out, a sample was sent to Dr. Satira Mccallum at [facility] for evaluation due to nonspecific local pathology report and poor tissue quality. A tentative diagnosis of Takayasu's arteritis was made, though it was admitted that the findings were not diagnostic. - Continue home prednisone 7.5mg  daily - Continue home methotrexate 20mg  weekly  CVA and DVT history: Patient was noted to have her first DVT at the age of 27, and mentioned that all of her clots had occurred in her left leg. Her first stroke occurred in 2014. Her young age and frequent events seems to point to a coagulopathy. Of concern was her positive anti-phospholipid antibody result in 2006, which seems to suggest an underlying autoimmune disorder such as  antiphospholipid syndrome. Due to her young age at first event and presence of both arterial and venous thromboses, this diagnosis is more likely and warrants further evaluation. - Continue home enoxaparin SQ 120mg  daily  FEN/GI: - Heart Healthy Diet - BMP - Hemoglobin A1C - Lipid panel - Replace electrolytes when K<4, Mg<2  DVT prophylaxis: - Continue home enoxaparin as above  Code: Full  Dispo: Home   This is a Careers information officer Note.  The care of the patient was discussed with Dr. Burgess Estelle and the  assessment and plan was formulated with their assistance.  Please see their note for official documentation of the patient encounter.   Signed: Sandria Manly, Med Student 06/30/2015, 5:08 PM

## 2015-07-01 ENCOUNTER — Encounter (HOSPITAL_COMMUNITY): Payer: Self-pay | Admitting: Radiology

## 2015-07-01 ENCOUNTER — Observation Stay (HOSPITAL_COMMUNITY): Payer: 59

## 2015-07-01 DIAGNOSIS — E876 Hypokalemia: Secondary | ICD-10-CM

## 2015-07-01 DIAGNOSIS — I776 Arteritis, unspecified: Secondary | ICD-10-CM

## 2015-07-01 DIAGNOSIS — M314 Aortic arch syndrome [Takayasu]: Secondary | ICD-10-CM | POA: Diagnosis not present

## 2015-07-01 DIAGNOSIS — I749 Embolism and thrombosis of unspecified artery: Secondary | ICD-10-CM

## 2015-07-01 DIAGNOSIS — I82502 Chronic embolism and thrombosis of unspecified deep veins of left lower extremity: Secondary | ICD-10-CM

## 2015-07-01 DIAGNOSIS — E785 Hyperlipidemia, unspecified: Secondary | ICD-10-CM

## 2015-07-01 DIAGNOSIS — Z8673 Personal history of transient ischemic attack (TIA), and cerebral infarction without residual deficits: Secondary | ICD-10-CM | POA: Diagnosis not present

## 2015-07-01 DIAGNOSIS — G459 Transient cerebral ischemic attack, unspecified: Secondary | ICD-10-CM | POA: Diagnosis not present

## 2015-07-01 DIAGNOSIS — G4700 Insomnia, unspecified: Secondary | ICD-10-CM

## 2015-07-01 DIAGNOSIS — I63412 Cerebral infarction due to embolism of left middle cerebral artery: Secondary | ICD-10-CM

## 2015-07-01 DIAGNOSIS — G453 Amaurosis fugax: Secondary | ICD-10-CM | POA: Diagnosis not present

## 2015-07-01 DIAGNOSIS — Z79899 Other long term (current) drug therapy: Secondary | ICD-10-CM

## 2015-07-01 DIAGNOSIS — Z7952 Long term (current) use of systemic steroids: Secondary | ICD-10-CM

## 2015-07-01 DIAGNOSIS — Z9114 Patient's other noncompliance with medication regimen: Secondary | ICD-10-CM

## 2015-07-01 DIAGNOSIS — Z7901 Long term (current) use of anticoagulants: Secondary | ICD-10-CM

## 2015-07-01 LAB — BASIC METABOLIC PANEL
ANION GAP: 10 (ref 5–15)
CO2: 24 mmol/L (ref 22–32)
Calcium: 8.9 mg/dL (ref 8.9–10.3)
Chloride: 104 mmol/L (ref 101–111)
Creatinine, Ser: 0.76 mg/dL (ref 0.44–1.00)
GFR calc Af Amer: >60 mL/min (ref >60)
Glucose, Bld: 76 mg/dL (ref 65–99)
Potassium: 3.7 mmol/L (ref 3.5–5.1)
SODIUM: 138 mmol/L (ref 135–145)

## 2015-07-01 LAB — LIPID PANEL
CHOLESTEROL: 206 mg/dL — AB (ref 0–200)
HDL: 72 mg/dL (ref >40)
LDL Cholesterol: 117 mg/dL — ABNORMAL HIGH (ref 0–99)
TRIGLYCERIDES: 83 mg/dL (ref <150)
Total CHOL/HDL Ratio: 2.9 RATIO
VLDL: 17 mg/dL (ref 0–40)

## 2015-07-01 LAB — HEMOGLOBIN A1C
Hgb A1c MFr Bld: 6 % — ABNORMAL HIGH (ref 4.8–5.6)
MEAN PLASMA GLUCOSE: 126 mg/dL

## 2015-07-01 LAB — SEDIMENTATION RATE: SED RATE: 48 mm/h — AB (ref 0–22)

## 2015-07-01 MED ORDER — IOHEXOL 350 MG/ML SOLN
50.0000 mL | Freq: Once | INTRAVENOUS | Status: AC | PRN
  Administered 2015-07-01: 50 mL via INTRAVENOUS

## 2015-07-01 MED ORDER — ASPIRIN 81 MG PO TBEC: 81.0000 mg | DELAYED_RELEASE_TABLET | Freq: Every day | ORAL | Status: AC

## 2015-07-01 NOTE — Progress Notes (Signed)
Subjective: Patient was feeling well this morning, hoping to go home today. No change in status overnight. She denied any new deficits, including loss of vision, facial or extremity weakness, slurred speech, headache or dizziness. She had refused ASA the night before since she stated she was already on a blood thinner, but agreed to start taking it after the team explained the benefit of dual therapy. She noted that she does not have a PCP for followup but is currently in the process of acquiring one with the [facility]. She denied ever being told she has any antiphospholipid antibodies or possible SLE.  Objective: Vital signs in last 24 hours: Filed Vitals:   07/01/15 0429 07/01/15 0946 07/01/15 1044 07/01/15 1114  BP: 146/94 130/79 116/74 134/81  Pulse: 68 100 90 94  Temp: 98.1 F (36.7 C) 98.7 F (37.1 C) 98.3 F (36.8 C) 98.4 F (36.9 C)  TempSrc: Oral Oral Oral Oral  Resp: 13 20    Height:      Weight:      SpO2: 100% 100%     Weight change:   Intake/Output Summary (Last 24 hours) at 07/01/15 1217 Last data filed at 07/01/15 1121  Gross per 24 hour  Intake    123 ml  Output    400 ml  Net   -277 ml   General appearance: alert, cooperative, appears stated age and no distress Head: Normocephalic, without obvious abnormality, atraumatic Eyes: conjunctivae/corneas clear. PERRL, EOM's intact. Fundi benign. 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 ROM normal. No CVA tenderness. Lungs: clear to auscultation bilaterally and normal work of breathing Heart: regular rate and rhythm, S1, S2 normal, no murmur, click, rub or gallop Abdomen: soft, non-tender; bowel sounds normal; no masses,  no organomegaly Extremities: Pulses weaker on L side in UE and LE, pulses present in all extremities Skin:  Skin color, texture, turgor normal. No rashes or lesions Neurologic:CNII-XII intact, sensation intact x4, 5/5 in RUE and RLE, 4+/5 in LLE, 0/5 in LUE. Hyperreflexive in L extremities. Visual fields intact, no new deficits from baseline Lab Results: Results for orders placed or performed during the hospital encounter of 06/30/15 (from the past 24 hour(s))  Ethanol     Status: None   Collection Time: 06/30/15 12:42 PM  Result Value Ref Range   Alcohol, Ethyl (B) <5 <5 mg/dL  Protime-INR     Status: None   Collection Time: 06/30/15 12:48 PM  Result Value Ref Range   Prothrombin Time 14.3 11.6 - 15.2 seconds   INR 1.09 0.00 - 1.49  APTT     Status: None   Collection Time: 06/30/15 12:48 PM  Result Value Ref Range   aPTT 29 24 - 37 seconds  CBC     Status: Abnormal   Collection Time: 06/30/15 12:48 PM  Result Value Ref Range   WBC 12.1 (H) 4.0 - 10.5 K/uL   RBC 4.07 3.87 - 5.11 MIL/uL   Hemoglobin 12.0 12.0 - 15.0 g/dL   HCT 38.1 36.0 - 46.0 %   MCV 93.6 78.0 - 100.0 fL   MCH 29.5 26.0 - 34.0 pg   MCHC 31.5 30.0 - 36.0 g/dL   RDW 18.2 (H) 11.5 - 15.5 %   Platelets 482 (H) 150 - 400 K/uL  Differential     Status: Abnormal   Collection Time:  06/30/15 12:48 PM  Result Value Ref Range   Neutrophils Relative % 75 43 - 77 %   Neutro Abs 9.0 (H) 1.7 - 7.7 K/uL   Lymphocytes Relative 20 12 - 46 %   Lymphs Abs 2.4 0.7 - 4.0 K/uL   Monocytes Relative 5 3 - 12 %   Monocytes Absolute 0.6 0.1 - 1.0 K/uL   Eosinophils Relative 0 0 - 5 %   Eosinophils Absolute 0.1 0.0 - 0.7 K/uL   Basophils Relative 0 0 - 1 %   Basophils Absolute 0.0 0.0 - 0.1 K/uL  Comprehensive metabolic panel     Status: Abnormal   Collection Time: 06/30/15 12:48 PM  Result Value Ref Range   Sodium 141 135 - 145 mmol/L   Potassium 3.2 (L) 3.5 - 5.1 mmol/L   Chloride 107 101 - 111 mmol/L   CO2 26 22 - 32 mmol/L   Glucose, Bld 95 65 - 99 mg/dL   BUN 6 6 - 20 mg/dL   Creatinine, Ser 0.91 0.44 - 1.00 mg/dL   Calcium  9.4 8.9 - 10.3 mg/dL   Total Protein 7.3 6.5 - 8.1 g/dL   Albumin 3.5 3.5 - 5.0 g/dL   AST 22 15 - 41 U/L   ALT 28 14 - 54 U/L   Alkaline Phosphatase 55 38 - 126 U/L   Total Bilirubin 0.2 (L) 0.3 - 1.2 mg/dL   GFR calc non Af Amer >60 >60 mL/min   GFR calc Af Amer >60 >60 mL/min   Anion gap 8 5 - 15  MRSA PCR Screening     Status: None   Collection Time: 06/30/15  5:45 PM  Result Value Ref Range   MRSA by PCR NEGATIVE NEGATIVE  Hemoglobin A1c     Status: Abnormal   Collection Time: 06/30/15  6:31 PM  Result Value Ref Range   Hgb A1c MFr Bld 6.0 (H) 4.8 - 5.6 %   Mean Plasma Glucose 126 mg/dL  Basic metabolic panel     Status: Abnormal   Collection Time: 07/01/15  5:27 AM  Result Value Ref Range   Sodium 138 135 - 145 mmol/L   Potassium 3.7 3.5 - 5.1 mmol/L   Chloride 104 101 - 111 mmol/L   CO2 24 22 - 32 mmol/L   Glucose, Bld 76 65 - 99 mg/dL   BUN <5 (L) 6 - 20 mg/dL   Creatinine, Ser 0.76 0.44 - 1.00 mg/dL   Calcium 8.9 8.9 - 10.3 mg/dL   GFR calc non Af Amer >60 >60 mL/min   GFR calc Af Amer >60 >60 mL/min   Anion gap 10 5 - 15  Lipid panel     Status: Abnormal   Collection Time: 07/01/15  5:27 AM  Result Value Ref Range   Cholesterol 206 (H) 0 - 200 mg/dL   Triglycerides 83 <150 mg/dL   HDL 72 >40 mg/dL   Total CHOL/HDL Ratio 2.9 RATIO   VLDL 17 0 - 40 mg/dL   LDL Cholesterol 117 (H) 0 - 99 mg/dL  Sedimentation rate     Status: Abnormal   Collection Time: 07/01/15  8:22 AM  Result Value Ref Range   Sed Rate 48 (H) 0 - 22 mm/hr    Micro Results: Recent Results (from the past 240 hour(s))  MRSA PCR Screening     Status: None   Collection Time: 06/30/15  5:45 PM  Result Value Ref Range Status   MRSA by PCR NEGATIVE NEGATIVE  Final    Comment:        The GeneXpert MRSA Assay (FDA approved for NASAL specimens only), is one component of a comprehensive MRSA colonization surveillance program. It is not intended to diagnose MRSA infection nor to guide  or monitor treatment for MRSA infections.    Studies/Results: Ct Angio Head W/cm &/or Wo Cm  07/01/2015   CLINICAL DATA:  History of 3 prior strokes, LEFT-sided weakness, LEFT vision change.  EXAM: CT ANGIOGRAPHY HEAD AND NECK  TECHNIQUE: Multidetector CT imaging of the head and neck was performed using the standard protocol during bolus administration of intravenous contrast. Multiplanar CT image reconstructions and MIPs were obtained to evaluate the vascular anatomy. Carotid stenosis measurements (when applicable) are obtained utilizing NASCET criteria, using the distal internal carotid diameter as the denominator.  CONTRAST:  15mL OMNIPAQUE IOHEXOL 350 MG/ML SOLN  COMPARISON:  MRI of the brain July 01, 2015 at 3:09 a.m. and CT angiogram of the head and neck Apr 11, 2014  FINDINGS: CT HEAD  Brain: Old RIGHT middle cerebral artery territory infarct with basal ganglia extension, unchanged from prior imaging. Small area LEFT mesial occipital lobe encephalomalacia. No intraparenchymal hemorrhage, mass effect, midline shift or acute large vascular territory infarct. Subcentimeter hypodensity LEFT caudate head corresponding to known acute infarct. Ex vacuo dilatation RIGHT lateral ventricle, no hydrocephalus. No abnormal extra-axial fluid collections. Basal cisterns are patent. No abnormal intracranial enhancement.  Calvarium and skull base: No skull fracture.  Paranasal sinuses: Well-aerated. Mastoid air cells are well aerated.  Orbits: Ocular globes and orbital contents unremarkable  CTA NECK  Similar wall thickening of the aortic arch, with chronically occluded innominate artery, LEFT Common carotid artery, LEFT subclavian artery. RIGHT Common carotid artery origin is widely patent, minimal calcific atherosclerosis, and possible surgical clips with anteriorly.  RIGHT Common carotid arteries are widely patent, coursing in a straight line fashion. Normal appearance of the RIGHT carotid bifurcation without  hemodynamically significant stenosis by NASCET criteria. Very mild chronic luminal irregularity of the RIGHT internal carotid artery.  RIGHT vertebral artery is dominant. Chronically occluded LEFT internal carotid artery, with faint contribution by muscular branches, minimal contrast opacification. Widely patent RIGHT vertebral artery.  No dissection, no pseudoaneurysm. No abnormal luminal irregularity. No contrast extravasation.  Soft tissues are nonsuspicious, status post bilateral breast implants. No acute osseous process though bone windows have not been submitted.  CTA HEAD  Anterior circulation: Patent RIGHT cervical, petrous, cavernous and supraclinoid internal carotid artery. Occluded LEFT internal carotid artery with small amount of retrograde flow into LEFT supraclinoid segment. Patent bilateral ophthalmic arteries. Diminutive RIGHT A1 segment, robust LEFT A1 segment with patent anterior communicating artery and, patent anterior cerebral arteries. Chronically occluded RIGHT carotid terminus with tiny collateral vessels. Chronically occluded RIGHT M1 1 segment, with partial contrast opacification of bifurcation due to collateral vessels, unchanged. Mild stenosis LEFT M1, worse than prior imaging.  Posterior circulation: RIGHT vertebral artery is dominant. Retrograde flow into LEFT V4 segment. Normal appearance of the basilar artery. Occluded LEFT distal P3 segment, new from prior imaging.  No aneurysm within the anterior nor posterior circulation.  IMPRESSION: CT HEAD: New LEFT caudate head lacunar infarct corresponding to acute infarct on today's MRI.  Old RIGHT MCA territory infarct. Old LEFT posterior cerebral artery territory infarct.  CTA NECK: Similar soft tissue about the aortic arch compatible with arteritis. Chronic occlusion of the innominate artery, LEFT Common carotid artery, LEFT subclavian artery, and LEFT vertebral artery (minimal reconstitution of distal LEFT vertebral  artery via muscular  branches).  Widely patent RIGHT Common carotid artery, RIGHT internal carotid artery and RIGHT vertebral artery.  CTA HEAD: Chronically occluded RIGHT carotid terminus with minimal collateralization.  New mild stenosis LEFT M1 segment. New occluded distal LEFT posterior cerebral artery.  Chronically occluded RIGHT M1 origin, collaterals contributing to RIGHT middle cerebral artery.   Electronically Signed   By: Elon Alas M.D.   On: 07/01/2015 05:19   Ct Head Wo Contrast  06/30/2015   CLINICAL DATA:  Code stroke. History of 3 strokes in 2014. 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 Headaches and dizziness.  EXAM: CT HEAD WITHOUT CONTRAST  TECHNIQUE: Contiguous axial images were obtained from the base of the skull through the vertex without intravenous contrast.  COMPARISON:  CT of the brain 04/20/2015  FINDINGS: There is encephalomalacia involving the posterior frontal lobe, insula, and basal ganglia. 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 There is no intra or extra-axial fluid collection or mass lesion. The basilar cisterns and ventricles have a normal appearance. There is no CT evidence for acute infarction or hemorrhage. Bone windows show normally aerated paranasal and mastoid air cells. No acute fracture.  IMPRESSION: 1. Changes consistent with previous right MCA distribution infarct. 2.  No evidence for acute intracranial abnormality. 3. Critical Value/emergent results were called by telephone at the time of interpretation on 06/30/2015 at 12:13 pm to Dr. Jim Like, who verbally acknowledged these results.   Electronically Signed   By: Nolon Nations M.D.   On: 06/30/2015 12:14   Ct Angio Neck W/cm &/or Wo/cm  07/01/2015   CLINICAL DATA:  History of 3 prior strokes, LEFT-sided weakness, LEFT vision change.  EXAM: CT ANGIOGRAPHY HEAD AND NECK  TECHNIQUE: Multidetector CT imaging of the head and neck was performed using the standard protocol during bolus  administration of intravenous contrast. Multiplanar CT image reconstructions and MIPs were obtained to evaluate the vascular anatomy. Carotid stenosis measurements (when applicable) are obtained utilizing NASCET criteria, using the distal internal carotid diameter as the denominator.  CONTRAST:  48mL OMNIPAQUE IOHEXOL 350 MG/ML SOLN  COMPARISON:  MRI of the brain July 01, 2015 at 3:09 a.m. and CT angiogram of the head and neck Apr 11, 2014  FINDINGS: CT HEAD  Brain: Old RIGHT middle cerebral artery territory infarct with basal ganglia extension, unchanged from prior imaging. Small area LEFT mesial occipital lobe encephalomalacia. No intraparenchymal hemorrhage, mass effect, midline shift or acute large vascular territory infarct. Subcentimeter hypodensity LEFT caudate head corresponding to known acute infarct. Ex vacuo dilatation RIGHT lateral ventricle, no hydrocephalus. No abnormal extra-axial fluid collections. Basal cisterns are patent. No abnormal intracranial enhancement.  Calvarium and skull base: No skull fracture.  Paranasal sinuses: Well-aerated. Mastoid air cells are well aerated.  Orbits: Ocular globes and orbital contents unremarkable  CTA NECK  Similar wall thickening of the aortic arch, with chronically occluded innominate artery, LEFT Common carotid artery, LEFT subclavian artery. RIGHT Common carotid artery origin is widely patent, minimal calcific atherosclerosis, and possible surgical clips with anteriorly.  RIGHT Common carotid arteries are widely patent, coursing in a straight line fashion. Normal appearance of the RIGHT carotid bifurcation without hemodynamically significant stenosis by NASCET criteria. Very mild chronic luminal irregularity of the RIGHT internal carotid artery.  RIGHT vertebral artery is dominant. Chronically occluded LEFT internal carotid artery, with faint contribution by muscular branches, minimal contrast opacification. Widely patent RIGHT vertebral artery.  No  dissection, no pseudoaneurysm. No abnormal  luminal irregularity. No contrast extravasation.  Soft tissues are nonsuspicious, status post bilateral breast implants. No acute osseous process though bone windows have not been submitted.  CTA HEAD  Anterior circulation: Patent RIGHT cervical, petrous, cavernous and supraclinoid internal carotid artery. Occluded LEFT internal carotid artery with small amount of retrograde flow into LEFT supraclinoid segment. Patent bilateral ophthalmic arteries. Diminutive RIGHT A1 segment, robust LEFT A1 segment with patent anterior communicating artery and, patent anterior cerebral arteries. Chronically occluded RIGHT carotid terminus with tiny collateral vessels. Chronically occluded RIGHT M1 1 segment, with partial contrast opacification of bifurcation due to collateral vessels, unchanged. Mild stenosis LEFT M1, worse than prior imaging.  Posterior circulation: RIGHT vertebral artery is dominant. Retrograde flow into LEFT V4 segment. Normal appearance of the basilar artery. Occluded LEFT distal P3 segment, new from prior imaging.  No aneurysm within the anterior nor posterior circulation.  IMPRESSION: CT HEAD: New LEFT caudate head lacunar infarct corresponding to acute infarct on today's MRI.  Old RIGHT MCA territory infarct. Old LEFT posterior cerebral artery territory infarct.  CTA NECK: Similar soft tissue about the aortic arch compatible with arteritis. Chronic occlusion of the innominate artery, LEFT Common carotid artery, LEFT subclavian artery, and LEFT vertebral artery (minimal reconstitution of distal LEFT vertebral artery via muscular branches).  Widely patent RIGHT Common carotid artery, RIGHT internal carotid artery and RIGHT vertebral artery.  CTA HEAD: Chronically occluded RIGHT carotid terminus with minimal collateralization.  New mild stenosis LEFT M1 segment. New occluded distal LEFT posterior cerebral artery.  Chronically occluded RIGHT M1 origin, collaterals  contributing to RIGHT middle cerebral artery.   Electronically Signed   By: Elon Alas M.D.   On: 07/01/2015 05:19   Mr Brain Wo Contrast  07/01/2015   ADDENDUM REPORT: 07/01/2015 05:21  ADDENDUM: Small area LEFT mesial occipital lobe encephalomalacia consistent with old LEFT posterior cerebral artery territory infarct.   Electronically Signed   By: Elon Alas M.D.   On: 07/01/2015 05:21   07/01/2015   CLINICAL DATA:  Transient LEFT vision loss for 15 minutes. Mild headache. History of multiple strokes, it inferior vena cava filter, on Lovenox, possible Takayasu's arteritis.  EXAM: MRI HEAD WITHOUT CONTRAST  TECHNIQUE: Multiplanar, multiecho pulse sequences of the brain and surrounding structures were obtained without intravenous contrast.  COMPARISON:  CT head June 30, 2015 and MRI of the head Mar 27, 2013  FINDINGS: 7 mm round focus of reduced diffusion LEFT caudate head, corresponding low ADC values and faint FLAIR T2 hyperintense signal. No susceptibility artifact to suggest acute hemorrhage.  RIGHT insular, frontoparietal encephalomalacia extending to the basal ganglia consistent with old infarct, faint associated susceptibility artifact most consistent with mineralization. Ex vacuo dilatation RIGHT lateral ventricle, the ventricles and sulci are otherwise normal for patient's age. Minimal white matter changes exclusive of the aforementioned abnormality compatible with chronic small vessel ischemic disease. No midline shift, mass effect or mass lesions.  No abnormal extra-axial fluid collections. Chronic LEFT internal carotid artery occlusion. Chronically occluded LEFT V4 segment.  Paranasal sinuses and mastoid air cells are well aerated. Ocular globes and orbital contents are normal. No abnormal sellar expansion. No cerebellar tonsillar ectopia. No suspicious calvarial bone marrow signal.  IMPRESSION: Acute subcentimeter LEFT caudate infarct.  Old RIGHT middle cerebral artery territory infarct.   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  Electronically Signed: By: Elon Alas M.D. On: 07/01/2015 04:06   Medications: I have reviewed the patient's current medications. Scheduled Meds: . aspirin  325 mg Oral Daily  . calcium-vitamin D  1 tablet Oral Daily  . enoxaparin  120 mg Subcutaneous QPM  . folic acid  1 mg Oral Daily  . [START ON 07/02/2015] methotrexate  20 mg Oral Q Sat  . multivitamin with minerals  1 tablet Oral Daily  . potassium chloride  40 mEq Oral BID  . predniSONE  7.5 mg Oral Daily  . 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 Annalee Genta on 8/30  FEN/GI: - Regular Diet - Hemoglobin A1C: 6.0% - Replace electrolytes when K<4, Mg<2  DVT prophylaxis: - Continue home enoxaparin 120mg  daily  Code: Full  Dispo: Home  This is a Careers information officer Note.  The care of the patient was discussed with Dr. Burgess Estelle and the assessment and plan formulated with their assistance.  Please see their attached note for official documentation of the daily encounter.   LOS: 1 day   [name], Med Student 07/01/2015, 12:17 PM

## 2015-07-01 NOTE — Progress Notes (Signed)
PT Cancellation Note  Patient Details Name: CELINES FEMIA MRN: [id number] DOB: [birth year]   Cancelled Treatment:    Reason Eval/Treat Not Completed: PT screened, no needs identified, will sign off. Pt reports she is at her baseline with mobility. She has been up to bathroom without difficulty. Uses quad cane and AFO at baseline.   Quayshaun Hubbert 07/01/2015, 10:02 AM  [place]

## 2015-07-01 NOTE — Discharge Summary (Signed)
Name: [name] MRN: [id number] DOB: [birth year] 39 y.o. PCP: No Pcp Per Patient  Date of Admission: 06/30/2015 11:42 AM Date of Discharge: 07/01/2015 Attending Physician: Levert Feinstein, MD  Discharge Diagnosis: 1. 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     CALCIUM CARBONATE-VITAMIN D PO  Take 600 mg by mouth daily.     enoxaparin 120 MG/0.8ML injection  Commonly known as:  LOVENOX  Inject 120 mg into the skin every evening. 7 pm nightly     folic acid 400 MCG tablet  Commonly known as:  FOLVITE  Take 400 mcg by mouth daily.     methotrexate 2.5 MG tablet  Commonly known as:  RHEUMATREX  Take 20 mg by mouth once a week. Caution:Chemotherapy. Protect from light. **Saturdays**     multivitamin tablet  Take 1 tablet by mouth daily.     predniSONE 5 MG tablet  Commonly known as:  DELTASONE  Take 7.5 mg by mouth daily.     temazepam 7.5 MG capsule  Commonly known as:  RESTORIL  Take 7.5 mg by mouth at bedtime as needed for sleep.        Disposition and follow-up:   [name] was discharged from [facility] in Good condition.  At the hospital follow up visit please address:  1.  Opthalmology follow up: Pt has never had a dilated eye exam despite having transient TIA/ amaurosis fugax 3 times in the last 3 years      Rheumatology follow up: Pt's ESR and CRP was elevated- her steroids and methotrexate  may need to be adjusted for her vasculitis    PCP  follow up: she has been put on aspirin 81 mg, due to the recurrent TIAs. She has a complex medical history with history of failed anticoag treatments. Though it is unsure whether she failed them they mainly target venous thromboembolism. She has never been on aspirin in the past              Lipid panel: came back as high cholesterol- may need to be on statin- ASCVD > 7.5 %   2.  Labs / imaging needed at time of follow-up: ESR, CRP, BMET, CBC  3.  Pending labs/ test needing follow-up: echo  Follow-up Appointments:     Follow-up Information    Follow up with MCCUEN,CHRISTINE L, MD. Go on 07/04/2015.   Specialty:  Ophthalmology   Why:  for eye exam   Contact information:   [address] [place] [address] [phone]       Follow up with Azzie Roup, MD. Schedule an appointment as soon as possible for a visit in 3 weeks.   Specialty:  Rheumatology   Contact information:   [address] [place] [address] [phone]       Follow up with Xu,Jindong, MD. Schedule an appointment as  soon as possible for a visit in 2 months.   Specialty:  Neurology   Why:  stroke clinic   Contact information:   [place] [address] [phone]       Discharge Instructions: Discharge Instructions    Ambulatory referral to Neurology    Complete by:  As directed   Pt will follow up with Dr. Erlinda Hong at [facility] in about 2 months. Thanks.     Call MD for:  difficulty breathing, headache or visual disturbances    Complete by:  As directed      Call MD for:  persistant dizziness or light-headedness    Complete by:  As directed      Call MD for:  temperature >100.4    Complete by:  As directed      Diet - low sodium heart healthy    Complete by:  As directed      Discharge instructions    Complete by:  As directed   Please follow up with Opthalmology on Monday morning Please follow up with Rheumatology as scheduled at the end of the  month Please follow up with PCP  Please start taking aspirin 81 mg daily     Increase activity slowly    Complete by:  As directed            Consultations:    Procedures Performed:  Ct Angio Head W/cm &/or Wo Cm  07/01/2015   CLINICAL DATA:  History of 3 prior strokes, LEFT-sided weakness, LEFT vision change.  EXAM: CT ANGIOGRAPHY HEAD AND NECK  TECHNIQUE: Multidetector CT imaging of the head and neck was performed using the standard protocol during bolus administration of intravenous contrast. Multiplanar CT image reconstructions and MIPs were obtained to evaluate the vascular anatomy. Carotid stenosis measurements (when applicable) are obtained utilizing NASCET criteria, using the distal internal carotid diameter as the denominator.  CONTRAST:  63mL OMNIPAQUE IOHEXOL 350 MG/ML SOLN  COMPARISON:  MRI of the brain July 01, 2015 at 3:09 a.m. and CT angiogram of the head and neck Apr 11, 2014  FINDINGS: CT HEAD  Brain: Old RIGHT middle cerebral artery territory infarct with basal ganglia extension, unchanged from prior imaging. Small area LEFT mesial occipital lobe encephalomalacia. No intraparenchymal hemorrhage, mass effect, midline shift or acute large vascular territory infarct. Subcentimeter hypodensity LEFT caudate head corresponding to known acute infarct. Ex vacuo dilatation RIGHT lateral ventricle, no hydrocephalus. No abnormal extra-axial fluid collections. Basal cisterns are patent. No abnormal intracranial enhancement.  Calvarium and skull base: No skull fracture.  Paranasal sinuses: Well-aerated. Mastoid air cells are well aerated.  Orbits: Ocular globes and orbital contents unremarkable  CTA NECK  Similar wall thickening of the aortic arch, with chronically occluded innominate artery, LEFT Common carotid artery, LEFT subclavian artery. RIGHT Common carotid artery origin is widely patent, minimal calcific atherosclerosis, and possible surgical clips with anteriorly.  RIGHT Common carotid  arteries are widely patent, coursing in a straight line fashion. Normal appearance of the RIGHT carotid bifurcation without hemodynamically significant stenosis by NASCET criteria. Very mild chronic luminal irregularity of the RIGHT internal carotid artery.  RIGHT vertebral artery is dominant. Chronically occluded LEFT internal carotid artery, with faint contribution by muscular branches, minimal contrast opacification. Widely patent RIGHT vertebral artery.  No dissection, no pseudoaneurysm. No abnormal luminal irregularity. No contrast extravasation.  Soft tissues are nonsuspicious, status post bilateral breast implants. No acute osseous process though bone windows have not been submitted.  CTA HEAD  Anterior circulation: Patent RIGHT cervical, petrous, cavernous and supraclinoid internal carotid artery. Occluded LEFT internal carotid artery with small amount of retrograde flow into LEFT supraclinoid segment. Patent bilateral ophthalmic arteries. Diminutive RIGHT A1 segment, robust LEFT A1 segment with patent anterior communicating artery and, patent anterior cerebral arteries. Chronically occluded RIGHT carotid terminus with tiny collateral vessels. Chronically occluded RIGHT M1 1 segment, with partial contrast opacification of bifurcation due to collateral vessels, unchanged. Mild stenosis LEFT M1, worse than prior imaging.  Posterior circulation: RIGHT vertebral artery is dominant. Retrograde flow into LEFT V4 segment. Normal appearance of the basilar artery. Occluded LEFT distal P3 segment, new from prior imaging.  No aneurysm within the anterior nor posterior circulation.  IMPRESSION: CT HEAD: New LEFT caudate head lacunar infarct corresponding to acute infarct on today's MRI.  Old RIGHT MCA territory infarct. Old LEFT posterior cerebral artery territory infarct.  CTA NECK: Similar soft tissue about the aortic arch compatible with arteritis. Chronic occlusion of the innominate artery, LEFT Common carotid  artery, LEFT subclavian artery, and LEFT vertebral artery (minimal reconstitution of distal LEFT vertebral artery via muscular branches).  Widely patent RIGHT Common carotid artery, RIGHT internal carotid artery and RIGHT vertebral artery.  CTA HEAD: Chronically occluded RIGHT carotid terminus with minimal collateralization.  New mild stenosis LEFT M1 segment. New occluded distal LEFT posterior cerebral artery.  Chronically occluded RIGHT M1 origin, collaterals contributing to RIGHT middle cerebral artery.   Electronically Signed   By: Elon Alas M.D.   On: 07/01/2015 05:19   Ct Head Wo Contrast  06/30/2015   CLINICAL DATA:  Code stroke. History of 3 strokes in 2014. 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 Headaches and dizziness.  EXAM: CT HEAD WITHOUT CONTRAST  TECHNIQUE: Contiguous axial images were obtained from the base of the skull through the vertex without intravenous contrast.  COMPARISON:  CT of the brain 04/20/2015  FINDINGS: There is encephalomalacia involving the posterior frontal lobe, insula, and basal ganglia. 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 There is no intra or extra-axial fluid collection or mass lesion. The basilar cisterns and ventricles have a normal appearance. There is no CT evidence for acute infarction or hemorrhage. Bone windows show normally aerated paranasal and mastoid air cells. No acute fracture.  IMPRESSION: 1. Changes consistent with previous right MCA distribution infarct. 2.  No evidence for acute intracranial abnormality. 3. Critical Value/emergent results were called by telephone at the time of interpretation on 06/30/2015 at 12:13 pm to Dr. Jim Like, who verbally acknowledged these results.   Electronically Signed   By: Nolon Nations M.D.   On: 06/30/2015 12:14   Ct Angio Neck W/cm &/or Wo/cm  07/01/2015   CLINICAL DATA:  History of 3 prior strokes, LEFT-sided weakness, LEFT vision change.  EXAM: CT ANGIOGRAPHY  HEAD AND NECK  TECHNIQUE: Multidetector CT imaging of the head and neck was performed using the standard protocol during bolus administration of intravenous contrast. Multiplanar CT image reconstructions and MIPs were obtained to evaluate the vascular anatomy. Carotid stenosis measurements (when applicable) are obtained utilizing NASCET criteria, using the distal internal carotid diameter as the denominator.  CONTRAST:  70mL OMNIPAQUE IOHEXOL 350 MG/ML SOLN  COMPARISON:  MRI of the brain July 01, 2015 at 3:09 a.m. and CT angiogram of the head and neck Apr 11, 2014  FINDINGS: CT HEAD  Brain: Old RIGHT middle cerebral artery territory infarct with basal ganglia extension, unchanged from prior imaging. Small area LEFT  mesial occipital lobe encephalomalacia. No intraparenchymal hemorrhage, mass effect, midline shift or acute large vascular territory infarct. Subcentimeter hypodensity LEFT caudate head corresponding to known acute infarct. Ex vacuo dilatation RIGHT lateral ventricle, no hydrocephalus. No abnormal extra-axial fluid collections. Basal cisterns are patent. No abnormal intracranial enhancement.  Calvarium and skull base: No skull fracture.  Paranasal sinuses: Well-aerated. Mastoid air cells are well aerated.  Orbits: Ocular globes and orbital contents unremarkable  CTA NECK  Similar wall thickening of the aortic arch, with chronically occluded innominate artery, LEFT Common carotid artery, LEFT subclavian artery. RIGHT Common carotid artery origin is widely patent, minimal calcific atherosclerosis, and possible surgical clips with anteriorly.  RIGHT Common carotid arteries are widely patent, coursing in a straight line fashion. Normal appearance of the RIGHT carotid bifurcation without hemodynamically significant stenosis by NASCET criteria. Very mild chronic luminal irregularity of the RIGHT internal carotid artery.  RIGHT vertebral artery is dominant. Chronically occluded LEFT internal carotid artery,  with faint contribution by muscular branches, minimal contrast opacification. Widely patent RIGHT vertebral artery.  No dissection, no pseudoaneurysm. No abnormal luminal irregularity. No contrast extravasation.  Soft tissues are nonsuspicious, status post bilateral breast implants. No acute osseous process though bone windows have not been submitted.  CTA HEAD  Anterior circulation: Patent RIGHT cervical, petrous, cavernous and supraclinoid internal carotid artery. Occluded LEFT internal carotid artery with small amount of retrograde flow into LEFT supraclinoid segment. Patent bilateral ophthalmic arteries. Diminutive RIGHT A1 segment, robust LEFT A1 segment with patent anterior communicating artery and, patent anterior cerebral arteries. Chronically occluded RIGHT carotid terminus with tiny collateral vessels. Chronically occluded RIGHT M1 1 segment, with partial contrast opacification of bifurcation due to collateral vessels, unchanged. Mild stenosis LEFT M1, worse than prior imaging.  Posterior circulation: RIGHT vertebral artery is dominant. Retrograde flow into LEFT V4 segment. Normal appearance of the basilar artery. Occluded LEFT distal P3 segment, new from prior imaging.  No aneurysm within the anterior nor posterior circulation.  IMPRESSION: CT HEAD: New LEFT caudate head lacunar infarct corresponding to acute infarct on today's MRI.  Old RIGHT MCA territory infarct. Old LEFT posterior cerebral artery territory infarct.  CTA NECK: Similar soft tissue about the aortic arch compatible with arteritis. Chronic occlusion of the innominate artery, LEFT Common carotid artery, LEFT subclavian artery, and LEFT vertebral artery (minimal reconstitution of distal LEFT vertebral artery via muscular branches).  Widely patent RIGHT Common carotid artery, RIGHT internal carotid artery and RIGHT vertebral artery.  CTA HEAD: Chronically occluded RIGHT carotid terminus with minimal collateralization.  New mild stenosis LEFT  M1 segment. New occluded distal LEFT posterior cerebral artery.  Chronically occluded RIGHT M1 origin, collaterals contributing to RIGHT middle cerebral artery.   Electronically Signed   By: Elon Alas M.D.   On: 07/01/2015 05:19   Mr Brain Wo Contrast  07/01/2015   ADDENDUM REPORT: 07/01/2015 05:21  ADDENDUM: Small area LEFT mesial occipital lobe encephalomalacia consistent with old LEFT posterior cerebral artery territory infarct.   Electronically Signed   By: Elon Alas M.D.   On: 07/01/2015 05:21   07/01/2015   CLINICAL DATA:  Transient LEFT vision loss for 15 minutes. Mild headache. History of multiple strokes, it inferior vena cava filter, on Lovenox, possible Takayasu's arteritis.  EXAM: MRI HEAD WITHOUT CONTRAST  TECHNIQUE: Multiplanar, multiecho pulse sequences of the brain and surrounding structures were obtained without intravenous contrast.  COMPARISON:  CT head June 30, 2015 and MRI of the head Mar 27, 2013  FINDINGS: 7 mm  round focus of reduced diffusion LEFT caudate head, corresponding low ADC values and faint FLAIR T2 hyperintense signal. No susceptibility artifact to suggest acute hemorrhage.  RIGHT insular, frontoparietal encephalomalacia extending to the basal ganglia consistent with old infarct, faint associated susceptibility artifact most consistent with mineralization. Ex vacuo dilatation RIGHT lateral ventricle, the ventricles and sulci are otherwise normal for patient's age. Minimal white matter changes exclusive of the aforementioned abnormality compatible with chronic small vessel ischemic disease. No midline shift, mass effect or mass lesions.  No abnormal extra-axial fluid collections. Chronic LEFT internal carotid artery occlusion. Chronically occluded LEFT V4 segment.  Paranasal sinuses and mastoid air cells are well aerated. Ocular globes and orbital contents are normal. No abnormal sellar expansion. No cerebellar tonsillar ectopia. No suspicious calvarial bone marrow  signal.  IMPRESSION: Acute subcentimeter LEFT caudate infarct.  Old RIGHT middle cerebral artery territory infarct.  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  Electronically Signed: By: Elon Alas M.D. On: 07/01/2015 04:06    2D Echo:   Cardiac Cath:   Admission HPI:  Ms. 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 She says she was at home and her left eye vision went out for 15-20 minutes before regaining it.  She realized that this was a similar presentation that happened last time so she called her husband and came here. There were no associated symptoms of lightheadedness, facial drooping, slurring of speech, weakness, new sensory deficits, numbness or eye pain. She had some headache, but denied nausea, vomiting, chest pain,. She does feel some shortness of breath when she walks up the steps but that has been an ongoing issue. Her LMP was 5 days back.  Regarding her DVT- 1st occurrence was at age 80, associated with OCP use, then in 1995 had she was on heparin for 9 months during a pregnancy, and intermittently she continued to have recurrent blood clots and had an IVC filter placed in 2008.  Records show that she has not been quite compliant with anticoags in the past. She had lupus anticoagulant positivity, but protein C, S and antithrombin, factor 5 leiden, cardiolipin, and homocysteine workup has been normal in 2014.  Regarding her TIA/stroke, there were 3 prior strokes. The first one was in Jan 2014, which resulted in transient loss of vision in one of the eyes, which happened while she was on warfarin. Then in Mar 2014, another stroke happened, which resulted in weakness of her left arm and legs, then she was put on Xarelto. In May  2014, another episode of left vision loss occurred and she was switched to coumadin.  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 Joan Flores at [facility] for hematology.   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 Ouida Sills, and a lung/ lymph node biopsy was done and she is on MTX and prednisone $RemoveBefore'10mg'dwdFRPAbeHOsX$  daily. The biopsy itself was unclear as there was no evidence of vasculitis, but inflammatory infiltrate was present. The case was forwarded to another Rheumatologist who thought that she may have [place]o. 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   Hospital Course by problem list: Active Problems:   TAKAYASU'S ARTERITIS   CVA (cerebral infarction)   Essential hypertension, malignant   HLD (hyperlipidemia)   1. Left sided visual loss: By the time Ms. Geving was evaluated for the visual loss it had resolved on its own, but due to her history of CVA events in the past she was admitted and worked up for a possible stroke. She showed no other neurological deficits above baseline, and her vitals remained stable during her stay. CT w/o contrast showed evidence of older infarcts from her previous CVAs and no acute hemorrhage, but she was not a candidate for tPA due to current enoxaparin use. A CT angiogram of the head and neck were performed, which showed a new lacunar infarct of the L caudate nucleus. 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 An MRI/MRA of the head confirmed the presence of a new onset lacunar infarct in the L caudate nucleus. Ophthalmology was consulted and agreed to see her the following Monday (8/8) but did not think it was an emergency to examine her urgently. She was given one dose of ASA 325mg  PO for platelet inhibition and will start on ASA 81mg  daily. She will continue on  her home anticoagulant, enoxaparin 120mg  daily.  Takayasu's Arteritis: The patient was first diagnosed in 2006 after the discovery and surgical resection of a mediastinal mass that was concerning for malignancy. Upon removal, it was noted to be more consistent with a vasculitic process. Along with her symptoms of L common carotid and subclavian occlusion, a large vessel vasculitis such as Takayasu's was deemed to be a potential cause in the absence of evidence for other explanations. Lab work and imaging from previous encounters were reviewed and also pointed more towards a vasculitis than other processes such as antiphospholipid syndrome or SLE. Patient will continue her home methotrexate weekly anda daily prednisone 10mg  PO. Followup with rheumaology on 8/30.   Discharge Vitals:   BP 92/47 mmHg  Pulse 75  Temp(Src) 98.5 F (36.9 C) (Oral)  Resp 20  Ht 5\' 4"  (1.626 m)  Wt 177 lb (80.287 kg)  BMI 30.37 kg/m2  SpO2 100%  LMP 06/17/2015  Discharge Labs:  Results for orders placed or performed during the hospital encounter of 06/30/15 (from the past 24 hour(s))  MRSA PCR Screening     Status: None   Collection Time: 06/30/15  5:45 PM  Result Value Ref Range   MRSA by PCR NEGATIVE NEGATIVE  Hemoglobin A1c     Status: Abnormal   Collection Time: 06/30/15  6:31 PM  Result Value Ref Range   Hgb A1c MFr Bld 6.0 (H) 4.8 - 5.6 %   Mean Plasma Glucose 126 mg/dL  Basic metabolic panel     Status: Abnormal   Collection Time: 07/01/15  5:27 AM  Result Value Ref Range   Sodium 138 135 - 145 mmol/L   Potassium 3.7 3.5 - 5.1 mmol/L   Chloride 104 101 - 111 mmol/L   CO2 24 22 - 32 mmol/L   Glucose, Bld 76 65 - 99 mg/dL   BUN <5 (L) 6 - 20 mg/dL   Creatinine, Ser 0.76 0.44 - 1.00 mg/dL   Calcium 8.9 8.9 - 10.3 mg/dL   GFR calc non Af Amer >60 >60 mL/min   GFR calc Af Amer >60 >60 mL/min   Anion gap 10 5 - 15  Lipid panel  Status: Abnormal   Collection Time: 07/01/15  5:27 AM  Result Value  Ref Range   Cholesterol 206 (H) 0 - 200 mg/dL   Triglycerides 83 <150 mg/dL   HDL 72 >40 mg/dL   Total CHOL/HDL Ratio 2.9 RATIO   VLDL 17 0 - 40 mg/dL   LDL Cholesterol 117 (H) 0 - 99 mg/dL  Sedimentation rate     Status: Abnormal   Collection Time: 07/01/15  8:22 AM  Result Value Ref Range   Sed Rate 48 (H) 0 - 22 mm/hr    Signed: Burgess Estelle, MD 07/01/2015, 3:22 PM    Services Ordered on Discharge:  Equipment Ordered on Discharge:

## 2015-07-01 NOTE — Progress Notes (Signed)
Pt received discharge instructions.  Educated patient about importance of follow-up appts and taking aspirin as prescribed.  Removed pt IV and tele. Physically and mentally the pt was at her normal baseline during her stay.  All questions answered, no further needs noted at this time.

## 2015-07-01 NOTE — Progress Notes (Signed)
  Echocardiogram 2D Echocardiogram has been performed.  Darlina Sicilian M 07/01/2015, 4:11 PM

## 2015-07-01 NOTE — Care Management Note (Signed)
Case Management Note  Patient Details  Name: [name] MRN: [id number] Date of Birth: 1976/08/16  Subjective/Objective:                 Patient placed in observation status. Patient from home; self care. Patient + new CVA. Patient has history of DVT with lovenox use prior to admission, and old CVAs resulting in weakness ambulating for which she uses a quad cane. Evaluated by PT, no new deficits, no new DME needs. Has PCP, no CM needs at this time.    Action/Plan:  Will discharge to home.  Expected Discharge Date:                  Expected Discharge Plan:  Home/Self Care  In-House Referral:     Discharge planning Services  CM Consult  Post Acute Care Choice:    Choice offered to:     DME Arranged:    DME Agency:     HH Arranged:    [place] Agency:     Status of Service:  Completed, signed off  Medicare Important Message Given:    Date Medicare IM Given:    Medicare IM give by:    Date Additional Medicare IM Given:    Additional Medicare Important Message give by:     If discussed at [place] of Stay Meetings, dates discussed:    Additional Comments:  Carles Collet, RN 07/01/2015, 1:55 PM

## 2015-07-01 NOTE — Progress Notes (Signed)
SLP Cancellation Note  Patient Details Name: MOLLEE NEER MRN: [id number] DOB: [birth year]   Cancelled treatment:       Reason Eval/Treat Not Completed: SLP screened, no needs identified, will sign off.  Patient known to SLP from previous CVA admission, patient briefly screened and appears to be at baseline.  Patient in agreement and eager to go home.   Gunnar Fusi, M.A., CCC-SLP [phone]   [place] 07/01/2015, 12:32 PM

## 2015-07-01 NOTE — Discharge Instructions (Signed)
Please start taking aspirin 81 mg daily Please follow up with Opthalmology on Monday morning- [facility]- Phone number [phone] at 1:30 PM Please follow up with Rheumatology as scheduled at the end of the month Please follow up with PCP    Aspirin and Your Heart Aspirin affects the way your blood clots and helps "thin" the blood. Aspirin has many uses in heart disease. It may be used as a primary prevention to help reduce the risk of heart related events. It also can be used as a secondary measure to prevent more heart attacks or to prevent additional damage from blood clots.  ASPIRIN MAY HELP IF YOU:  Have had a heart attack or chest pain.  Have undergone open heart surgery such as CABG (Coronary Artery Bypass Surgery).  Have had coronary angioplasty with or without stents.  Have experienced a stroke or TIA (transient ischemic attack).  Have peripheral vascular disease (PAD).  Have chronic heart rhythm problems such as atrial fibrillation.  Are at risk for heart disease. BEFORE STARTING ASPIRIN Before you start taking aspirin, your caregiver will need to review your medical history. Many things will need to be taken into consideration, such as:  Smoking status.  Blood pressure.  Diabetes.  Gender.  Weight.  Cholesterol level. ASPIRIN DOSES  Aspirin should only be taken on the advice of your caregiver. Talk to your caregiver about how much aspirin you should take. Aspirin comes in different doses such as:  81 mg.  162 mg.  325 mg.  The aspirin dose you take may be affected by many factors, some of which include:  Your current medications, especially if your are taking blood-thinners or anti-platelet medicine.  Liver function.  Heart disease risk.  Age.  Aspirin comes in two forms:  Non-enteric-coated. This type of aspirin does not have a coating and is absorbed faster. Non-enteric coated aspirin is recommended for patients experiencing  chest pain symptoms. This type of aspirin also comes in a chewable form.  Enteric-coated. This means the aspirin has a special coating that releases the medicine very slowly. Enteric-coated aspirin causes less stomach upset. This type of aspirin should not be chewed or crushed. ASPIRIN SIDE EFFECTS Daily use of aspirin can increase your risk of serious side effects. Some of these include:  Increased bleeding. This can range from a cut that does not stop bleeding to more serious problems such as stomach bleeding or bleeding into the brain (Intracerebral bleeding).  Increased bruising.  Stomach upset.  An allergic reaction such as red, itchy skin.  Increased risk of bleeding when combined with non-steroidal anti-inflammatory medicine (NSAIDS).  Alcohol should be drank in moderation when taking aspirin. Alcohol can increase the risk of stomach bleeding when taken with aspirin.  Aspirin should not be given to children less than 60 years of age due to the association of Reye syndrome. Reye syndrome is a serious illness that can affect the brain and liver. Studies have linked Reye syndrome with aspirin use in children.  People that have nasal polyps have an increased risk of developing an aspirin allergy. SEEK MEDICAL CARE IF:   You develop an allergic reaction such as:  Hives.  Itchy skin.  Swelling of the lips, tongue or face.  You develop stomach pain.  You have unusual bleeding or bruising.  You have ringing in your ears. SEEK IMMEDIATE MEDICAL CARE IF:   You have severe chest pain, especially if the pain is crushing or pressure-like and spreads to the arms, back,  neck, or jaw. THIS IS AN EMERGENCY. Do not wait to see if the pain will go away. Get medical help at once. Call your local emergency services (911 in the [place] DO NOT drive yourself to the hospital.  You have stroke-like symptoms such as:  Loss of vision.  Difficulty talking.  Numbness or weakness on one side of  your body.  Numbness or weakness in your arm or leg.  Not thinking clearly or feeling confused.  Your bowel movements are bloody, dark red or black in color.  You vomit or cough up blood.  You have blood in your urine.  You have shortness of breath, coughing or wheezing. MAKE SURE YOU:   Understand these instructions.  Will monitor your condition.  Seek immediate medical care if necessary. Document Released: 10/25/2008 Document Revised: 03/09/2013 Document Reviewed: 02/17/2014 [facility] Patient Information 2015 [place], [place]. This information is not intended to replace advice given to you by your health care provider. Make sure you discuss any questions you have with your health care provider.  Amaurosis Fugax Amaurosis fugax is a condition in which a person loses sight in one eye. The loss of vision in the affected eye may be total or partial. It usually lasts just a few seconds or minutes. Then, it returns to normal. Occasionally, it may last for several hours. This is caused by interruption of blood flow to the artery that supplies blood to the retina (lining at the back of the eye, contains nerves needed for sight). The temporary loss of blood flow causes symptoms similar to a stroke. The family of symptoms that happen from a loss of blood flow is called a Transient Ischemic Attack (TIA, mini-stroke). In the case of amaurosis fugax, the eye is the organ that is involved. SYMPTOMS   Painless, sudden loss of vision in one eye.  Visual loss is often from the top down, appearing like a curtain being pulled down over the field of vision.  Rapid return of vision. Vision generally comes back in a few minutes to several hours. CAUSES  TIAs and amaurosis fugax are caused by a loss of blood flow. This can be due to a buildup of cholesterol and fats (plaque) in the arteries or the heart. If some of that plaque comes off the artery and gets into the bloodstream, it can flow to the artery  that supplies blood to the retina, blocking the flow of blood to the retina. When that happens, vision is lost for as long as the blood flow is interrupted. Factors that make it more likely you will have amaurosis fugax at some point include:  Smoking.  Poorly controlled diabetes.  High blood pressure.  High cholesterol levels. Medical conditions that may increase the risk of an attack of amaurosis fugax include:  Heart disease.  Diseases of the heart valves.  Certain diseases of the blood (sickle cell anemia, leukemia).  Blood clotting (coagulation) disorders.  Artery inflammation (temporal arteritis, giant cell arteritis). Since amaurosis fugax is an "incomplete stroke," in some people it can be a sign of an increased risk for an actual stroke. A stroke can result in permanent vision loss or loss of other body functions. As a result, caring for yourself after amaurosis fugax means taking many of the same steps you should take to prevent a stroke. HOME CARE INSTRUCTIONS   Only take over-the-counter or prescription medicines for pain, discomfort, or fever as directed by your caregiver.  Take any medicines that are prescribed for control  of your blood pressure and cholesterol levels.  Keep diabetes under control as well as possible.  Stop smoking.  Follow diet instructions, if your caregiver has given them to you.  Try to get at least 30 minutes of moderate physical activity every day. If you have not been active, talk to your caregiver about how to get started. SEEK IMMEDIATE MEDICAL CARE IF:   You lose vision in one or both eyes again, even if only for a short period of time.  You lose vision in one eye and it does not recover within a very brief time (less than 5-10 minutes). The sooner you see an eye specialist (ophthalmologist), the better the chance of regaining some vision, in the case of a central retinal artery occlusion (CRAO, blockage of central retinal artery).  However, most cases of CRAO result in some degree of permanent visual loss, even with aggressive treatment.  You have symptoms of a stroke:  Weakness in one side of your body.  Difficulty speaking or thinking clearly.  Lack of coordination. Document Released: 08/21/2008 Document Revised: 02/04/2012 Document Reviewed: 08/21/2008 [facility] Patient Information 2015 [place], [place]. This information is not intended to replace advice given to you by your health care provider. Make sure you discuss any questions you have with your health care provider.

## 2015-07-01 NOTE — Progress Notes (Addendum)
STROKE TEAM PROGRESS NOTE   HISTORY [name] is a 39 y.o. female hx of Takayasu's arteritis, multiple prior strokes presenting to ED with transient episode of left visual field cut. Notes acute onset of symptoms at 1130am this morning. Describes a blacking out of the left side of her vision in both eyes, lasted around 15-30 minutes. No associated weakness, sensory deficits, no speech changes. No headache or eye pain. Currently back to baseline.   Reports 3 prior strokes. 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 She takes Lovenox 136m nightly for her Takayasu's arteritis. She does not have a neurologist she typically follows with.   Date last known well: 8/04 Time last known well: 1130 tPA Given: no, symptoms resolved, recent high dose of lovenox Modified Rankin: Rankin Score=2  SUBJECTIVE (INTERVAL HISTORY) No family is at the bedside.  Overall she feels her condition is completely resolved. We recounted her history and symptoms. She stated that she had sudden onset left visual loss lasting 15-30 min and resolved. So far she only has residue deficit from her previous strokes. Her prednisone was recently tapered from 149mto 7.62m34mStill on MTX. ESR is high at 48 today.    OBJECTIVE Temp:  [97.7 F (36.5 C)-99.1 F (37.3 C)] 98.1 F (36.7 C) (08/05 0429) Pulse Rate:  [68-90] 68 (08/05 0429) Cardiac Rhythm:  [-] Normal sinus rhythm (08/04 1901) Resp:  [12-19] 13 (08/05 0429) BP: (98-146)/(52-106) 146/94 mmHg (08/05 0429) SpO2:  [98 %-100 %] 100 % (08/05 0429) Weight:  [78.019 kg (172 lb)-80.287 kg (177 lb)] 80.287 kg (177 lb) (08/04 1200)  No results for input(s): GLUCAP in the last 168 hours.  Recent Labs Lab 06/30/15 1209 06/30/15 1248 07/01/15 0527  NA 141 141 138  K 3.9 3.2* 3.7  CL 106 107 104  CO2  --  26 24  GLUCOSE 116* 95 76  BUN 8 6 <5*  CREATININE 0.90 0.91 0.76  CALCIUM  --  9.4 8.9    Recent Labs Lab  06/30/15 1248  AST 22  ALT 28  ALKPHOS 55  BILITOT 0.2*  PROT 7.3  ALBUMIN 3.5    Recent Labs Lab 06/30/15 1209 06/30/15 1248  WBC  --  12.1*  NEUTROABS  --  9.0*  HGB 13.3 12.0  HCT 39.0 38.1  MCV  --  93.6  PLT  --  482*   No results for input(s): CKTOTAL, CKMB, CKMBINDEX, TROPONINI in the last 168 hours.  Recent Labs  06/30/15 1248  LABPROT 14.3  INR 1.09   No results for input(s): COLORURINE, LABSPEC, PHURINE, GLUCOSEU, HGBUR, BILIRUBINUR, KETONESUR, PROTEINUR, UROBILINOGEN, NITRITE, LEUKOCYTESUR in the last 72 hours.  Invalid input(s): APPERANCEUR     Component Value Date/Time   CHOL 206* 07/01/2015 0527   TRIG 83 07/01/2015 0527   HDL 72 07/01/2015 0527   CHOLHDL 2.9 07/01/2015 0527   VLDL 17 07/01/2015 0527   LDLCALC 117* 07/01/2015 0527   Lab Results  Component Value Date   HGBA1C 5.6 03/27/2013      Component Value Date/Time   LABOPIA NONE DETECTED 03/27/2013 0247   COCAINSCRNUR NONE DETECTED 03/27/2013 0247   LABBENZ NONE DETECTED 03/27/2013 0247   AMPHETMU NONE DETECTED 03/27/2013 0247   THCU NONE DETECTED 03/27/2013 0247   LABBARB NONE DETECTED 03/27/2013 0247     Recent Labs Lab 06/30/15 1242  ETH <5     Imaging  Ct Angio Head W/cm &/or Wo Cm 07/01/2015  CT HEAD:  New LEFT caudate head lacunar infarct corresponding to acute infarct on today's MRI.   Old RIGHT MCA territory infarct.  Old LEFT posterior cerebral artery territory infarct.    CTA NECK:  Similar soft tissue about the aortic arch compatible with arteritis. Chronic occlusion of the innominate artery, LEFT Common carotid artery, LEFT subclavian artery, and LEFT vertebral artery (minimal reconstitution of distal LEFT vertebral artery via muscular branches).  Widely patent RIGHT Common carotid artery, RIGHT internal carotid artery and RIGHT vertebral artery.    CTA HEAD:  Chronically occluded RIGHT carotid terminus with minimal collateralization.  New mild stenosis LEFT  M1 segment. New occluded distal LEFT posterior cerebral artery.  Chronically occluded RIGHT M1 origin, collaterals contributing to RIGHT middle cerebral artery.     Ct Head Wo Contrast 06/30/2015    1. Changes consistent with previous right MCA distribution infarct.  2.  No evidence for acute intracranial abnormality.   Mr Brain Wo Contrast 07/01/2015    ADDENDUM REPORT: 07/01/2015 05:21  ADDENDUM: Small area LEFT mesial occipital lobe encephalomalacia consistent with old LEFT posterior cerebral artery territory infarct.     07/01/2015    Acute subcentimeter LEFT caudate infarct.  Old RIGHT middle cerebral artery territory infarct.  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   2D echo - pending  PHYSICAL EXAM  Temp:  [97.7 F (36.5 C)-98.7 F (37.1 C)] 98.4 F (36.9 C) (08/05 1114) Pulse Rate:  [68-100] 94 (08/05 1114) Resp:  [12-20] 20 (08/05 0946) BP: (98-146)/(52-94) 134/81 mmHg (08/05 1114) SpO2:  [99 %-100 %] 100 % (08/05 0946)  General - Well nourished, well developed, in no apparent distress.  Ophthalmologic - Fundi not visualized due to photophobia.  Cardiovascular - Regular rate and rhythm with no murmur.  Mental Status -  Level of arousal and orientation to time, place, and person were intact. Language including expression, naming, repetition, comprehension was assessed and found intact. Fund of Knowledge was assessed and was intact.  Cranial Nerves II - XII - II - right upper quandrantoanopia. III, IV, VI - Extraocular movements intact. V - Facial sensation intact bilaterally. VII - Facial movement intact bilaterally, right nasolabial fold flattening. VIII - Hearing & vestibular intact bilaterally. X - Palate elevates symmetrically. XI - Chin turning & shoulder shrug intact bilaterally. XII - Tongue protrusion intact.  Motor Strength - The patient's strength was left UE deltoid 3-/5, bicep and tricep and hand grip 0/5. LLE 4+/5, RUE and  RLE 5/5. Bulk was normal and fasciculations were absent.   Motor Tone - Muscle tone was assessed at the neck and appendages and was normal.  Reflexes - The patient's reflexes were 1+ in all extremities and she had no pathological reflexes.  Sensory - Light touch, temperature/pinprick were assessed and were symmetrical.    Coordination - The patient had normal movements in the right hand with no ataxia or dysmetria.  Tremor was absent.  Gait and Station - not tested due to safety concerns   ASSESSMENT/PLAN [name] is a 39 y.o. female with history of Takayasu's arteritis, multiple prior strokes presenting with transient left vision loss, consistent with amaurosis fugax. She did not receive IV t-PA due to resolution of deficit.   Stroke:  left caudate small infarct, likely embolic pattern given amaurosis, hx of Takayasu arteritis with multiple previous infarcts.  Resultant amaurosis fugax resolved.  MRI  Acute left caudate small infarct  CTA head and neck - largely no  changed from 08/2014  2D Echo  pending  LDL 117  HgbA1c 6.0  lovenox therapeutic dose for VTE prophylaxis  Diet Heart Room service appropriate?: Yes; Fluid consistency:: Thin  lovenox 154m  prior to admission, now on aspirin 325 mg orally every day and lovenox 121m. We recommend to decrease ASA to 8149mo lower the risk of bleeding. We also recommend to check anti-Xa levels to make sure pt responding well with therapeutic lovenox.   Patient counseled to be compliant with her antithrombotic medications  Ongoing aggressive stroke risk factor management  Takayasu arteritis with multiple prior infarcts  11/2010 - MRI negative, MRA showed left CCA occlusion with left PCA and left ACA to suppler left MCA  11/2012 - MRI left PCA infarct (resulting RUE quandrantoanopia), MRA unchanged  01/2013 - MRI right MCA infarct and MRA right T-ICA cut off and left VA occlusion  03/2013 - right MCA infarct  extension  03/2014 - MRI negative, MRA recannulization of right M1 but with high grade stenosis of segmental right M1  Has been on high dose prednisone, recently tapered from 10 to 7.5mg77mas been on MTX once a week  Follows up with outside rheumatology, plan to switch prednisone to other agent  ESR 48 and CRP pending  Recommend high dose steroids with prednisone 60mg15mly until sees her rheumatology for further management.  Hyperlipidemia  Home meds:  none  LDL 117, goal < 70  Recommend statin such as lipitor 20mg 60my to be added.  Continue statin at discharge  Other Stroke Risk Factors  Obesity, Body mass index is 30.37 kg/(m^2).   Hx stroke/TIA  Hospital day # 1  Neurology will sign off. Please call with questions. [facility] Thanks for the consult.  JindonRosalin HawkinghD Stroke Neurology 07/01/2015 2:38 PM   To contact Stroke Continuity provider, please refer to [email or web address], contact General Neurology

## 2015-07-01 NOTE — Plan of Care (Signed)
Problem: Acute Treatment Outcomes Goal: Prognosis discussed with family/patient as appropriate Outcome: Not Applicable Date Met:  50/87/19 Pt has had a history of 3 strokes in 2014  Problem: Progression Outcomes Goal: Rehab Team goals identified Outcome: Not Applicable Date Met:  94/12/90 She had already went through rehab from past strokes Goal: Pain controlled Outcome: Completed/Met Date Met:  07/01/15 Pt did not complain of any pain and did not request any pain medication 07/01/2015  Problem: Discharge/Transitional Outcomes Goal: Family/Caregiver willing and able to support plan Family/Caregiver willing and able to support plan for self-management after transition home  Outcome: Completed/Met Date Met:  07/01/15 Her husband and her mom were her support system

## 2015-07-01 NOTE — Progress Notes (Signed)
Subjective: No acute events overnight. Pt slept well Was eager to go home No headaches or vision problems Last night she did not take the aspirin, we explained the rationale behind the aspirin and she agreed  We will call opthalmology to get their opinion  Objective: Vital signs in last 24 hours: Filed Vitals:   07/01/15 0429 07/01/15 0946 07/01/15 1044 07/01/15 1114  BP: 146/94 130/79 116/74 134/81  Pulse: 68 100 90 94  Temp: 98.1 F (36.7 C) 98.7 F (37.1 C) 98.3 F (36.8 C) 98.4 F (36.9 C)  TempSrc: Oral Oral Oral Oral  Resp: 13 20    Height:      Weight:      SpO2: 100% 100%     Weight change:   Intake/Output Summary (Last 24 hours) at 07/01/15 1218 Last data filed at 07/01/15 1121  Gross per 24 hour  Intake    123 ml  Output    400 ml  Net   -277 ml    Lab Results: Reviewed  Micro Results: Recent Results (from the past 240 hour(s))  MRSA PCR Screening     Status: None   Collection Time: 06/30/15  5:45 PM  Result Value Ref Range Status   MRSA by PCR NEGATIVE NEGATIVE Final    Comment:        The GeneXpert MRSA Assay (FDA approved for NASAL specimens only), is one component of a comprehensive MRSA colonization surveillance program. It is not intended to diagnose MRSA infection nor to guide or monitor treatment for MRSA infections.    Studies/Results: Ct Angio Head W/cm &/or Wo Cm  07/01/2015   CLINICAL DATA:  History of 3 prior strokes, LEFT-sided weakness, LEFT vision change.  EXAM: CT ANGIOGRAPHY HEAD AND NECK  TECHNIQUE: Multidetector CT imaging of the head and neck was performed using the standard protocol during bolus administration of intravenous contrast. Multiplanar CT image reconstructions and MIPs were obtained to evaluate the vascular anatomy. Carotid stenosis measurements (when applicable) are obtained utilizing NASCET criteria, using the distal internal carotid diameter as the denominator.  CONTRAST:  54mL OMNIPAQUE IOHEXOL 350 MG/ML SOLN   COMPARISON:  MRI of the brain July 01, 2015 at 3:09 a.m. and CT angiogram of the head and neck Apr 11, 2014  FINDINGS: CT HEAD  Brain: Old RIGHT middle cerebral artery territory infarct with basal ganglia extension, unchanged from prior imaging. Small area LEFT mesial occipital lobe encephalomalacia. No intraparenchymal hemorrhage, mass effect, midline shift or acute large vascular territory infarct. Subcentimeter hypodensity LEFT caudate head corresponding to known acute infarct. Ex vacuo dilatation RIGHT lateral ventricle, no hydrocephalus. No abnormal extra-axial fluid collections. Basal cisterns are patent. No abnormal intracranial enhancement.  Calvarium and skull base: No skull fracture.  Paranasal sinuses: Well-aerated. Mastoid air cells are well aerated.  Orbits: Ocular globes and orbital contents unremarkable  CTA NECK  Similar wall thickening of the aortic arch, with chronically occluded innominate artery, LEFT Common carotid artery, LEFT subclavian artery. RIGHT Common carotid artery origin is widely patent, minimal calcific atherosclerosis, and possible surgical clips with anteriorly.  RIGHT Common carotid arteries are widely patent, coursing in a straight line fashion. Normal appearance of the RIGHT carotid bifurcation without hemodynamically significant stenosis by NASCET criteria. Very mild chronic luminal irregularity of the RIGHT internal carotid artery.  RIGHT vertebral artery is dominant. Chronically occluded LEFT internal carotid artery, with faint contribution by muscular branches, minimal contrast opacification. Widely patent RIGHT vertebral artery.  No dissection, no pseudoaneurysm. No abnormal  luminal irregularity. No contrast extravasation.  Soft tissues are nonsuspicious, status post bilateral breast implants. No acute osseous process though bone windows have not been submitted.  CTA HEAD  Anterior circulation: Patent RIGHT cervical, petrous, cavernous and supraclinoid internal carotid  artery. Occluded LEFT internal carotid artery with small amount of retrograde flow into LEFT supraclinoid segment. Patent bilateral ophthalmic arteries. Diminutive RIGHT A1 segment, robust LEFT A1 segment with patent anterior communicating artery and, patent anterior cerebral arteries. Chronically occluded RIGHT carotid terminus with tiny collateral vessels. Chronically occluded RIGHT M1 1 segment, with partial contrast opacification of bifurcation due to collateral vessels, unchanged. Mild stenosis LEFT M1, worse than prior imaging.  Posterior circulation: RIGHT vertebral artery is dominant. Retrograde flow into LEFT V4 segment. Normal appearance of the basilar artery. Occluded LEFT distal P3 segment, new from prior imaging.  No aneurysm within the anterior nor posterior circulation.  IMPRESSION: CT HEAD: New LEFT caudate head lacunar infarct corresponding to acute infarct on today's MRI.  Old RIGHT MCA territory infarct. Old LEFT posterior cerebral artery territory infarct.  CTA NECK: Similar soft tissue about the aortic arch compatible with arteritis. Chronic occlusion of the innominate artery, LEFT Common carotid artery, LEFT subclavian artery, and LEFT vertebral artery (minimal reconstitution of distal LEFT vertebral artery via muscular branches).  Widely patent RIGHT Common carotid artery, RIGHT internal carotid artery and RIGHT vertebral artery.  CTA HEAD: Chronically occluded RIGHT carotid terminus with minimal collateralization.  New mild stenosis LEFT M1 segment. New occluded distal LEFT posterior cerebral artery.  Chronically occluded RIGHT M1 origin, collaterals contributing to RIGHT middle cerebral artery.   Electronically Signed   By: Elon Alas M.D.   On: 07/01/2015 05:19   Ct Head Wo Contrast  06/30/2015   CLINICAL DATA:  Code stroke. History of 3 strokes in 2014. 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 Headaches and dizziness.  EXAM: CT HEAD WITHOUT CONTRAST  TECHNIQUE: Contiguous axial images were  obtained from the base of the skull through the vertex without intravenous contrast.  COMPARISON:  CT of the brain 04/20/2015  FINDINGS: There is encephalomalacia involving the posterior frontal lobe, insula, and basal ganglia. 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 There is no intra or extra-axial fluid collection or mass lesion. The basilar cisterns and ventricles have a normal appearance. There is no CT evidence for acute infarction or hemorrhage. Bone windows show normally aerated paranasal and mastoid air cells. No acute fracture.  IMPRESSION: 1. Changes consistent with previous right MCA distribution infarct. 2.  No evidence for acute intracranial abnormality. 3. Critical Value/emergent results were called by telephone at the time of interpretation on 06/30/2015 at 12:13 pm to Dr. Jim Like, who verbally acknowledged these results.   Electronically Signed   By: Nolon Nations M.D.   On: 06/30/2015 12:14   Ct Angio Neck W/cm &/or Wo/cm  07/01/2015   CLINICAL DATA:  History of 3 prior strokes, LEFT-sided weakness, LEFT vision change.  EXAM: CT ANGIOGRAPHY HEAD AND NECK  TECHNIQUE: Multidetector CT imaging of the head and neck was performed using the standard protocol during bolus administration of intravenous contrast. Multiplanar CT image reconstructions and MIPs were obtained to evaluate the vascular anatomy. Carotid stenosis measurements (when applicable) are obtained utilizing NASCET criteria, using the distal internal carotid diameter as the denominator.  CONTRAST:  77mL OMNIPAQUE IOHEXOL 350 MG/ML SOLN  COMPARISON:  MRI of the brain July 01, 2015 at 3:09 a.m. and CT angiogram of the  head and neck Apr 11, 2014  FINDINGS: CT HEAD  Brain: Old RIGHT middle cerebral artery territory infarct with basal ganglia extension, unchanged from prior imaging. Small area LEFT mesial occipital lobe encephalomalacia. No intraparenchymal hemorrhage, mass effect,  midline shift or acute large vascular territory infarct. Subcentimeter hypodensity LEFT caudate head corresponding to known acute infarct. Ex vacuo dilatation RIGHT lateral ventricle, no hydrocephalus. No abnormal extra-axial fluid collections. Basal cisterns are patent. No abnormal intracranial enhancement.  Calvarium and skull base: No skull fracture.  Paranasal sinuses: Well-aerated. Mastoid air cells are well aerated.  Orbits: Ocular globes and orbital contents unremarkable  CTA NECK  Similar wall thickening of the aortic arch, with chronically occluded innominate artery, LEFT Common carotid artery, LEFT subclavian artery. RIGHT Common carotid artery origin is widely patent, minimal calcific atherosclerosis, and possible surgical clips with anteriorly.  RIGHT Common carotid arteries are widely patent, coursing in a straight line fashion. Normal appearance of the RIGHT carotid bifurcation without hemodynamically significant stenosis by NASCET criteria. Very mild chronic luminal irregularity of the RIGHT internal carotid artery.  RIGHT vertebral artery is dominant. Chronically occluded LEFT internal carotid artery, with faint contribution by muscular branches, minimal contrast opacification. Widely patent RIGHT vertebral artery.  No dissection, no pseudoaneurysm. No abnormal luminal irregularity. No contrast extravasation.  Soft tissues are nonsuspicious, status post bilateral breast implants. No acute osseous process though bone windows have not been submitted.  CTA HEAD  Anterior circulation: Patent RIGHT cervical, petrous, cavernous and supraclinoid internal carotid artery. Occluded LEFT internal carotid artery with small amount of retrograde flow into LEFT supraclinoid segment. Patent bilateral ophthalmic arteries. Diminutive RIGHT A1 segment, robust LEFT A1 segment with patent anterior communicating artery and, patent anterior cerebral arteries. Chronically occluded RIGHT carotid terminus with tiny collateral  vessels. Chronically occluded RIGHT M1 1 segment, with partial contrast opacification of bifurcation due to collateral vessels, unchanged. Mild stenosis LEFT M1, worse than prior imaging.  Posterior circulation: RIGHT vertebral artery is dominant. Retrograde flow into LEFT V4 segment. Normal appearance of the basilar artery. Occluded LEFT distal P3 segment, new from prior imaging.  No aneurysm within the anterior nor posterior circulation.  IMPRESSION: CT HEAD: New LEFT caudate head lacunar infarct corresponding to acute infarct on today's MRI.  Old RIGHT MCA territory infarct. Old LEFT posterior cerebral artery territory infarct.  CTA NECK: Similar soft tissue about the aortic arch compatible with arteritis. Chronic occlusion of the innominate artery, LEFT Common carotid artery, LEFT subclavian artery, and LEFT vertebral artery (minimal reconstitution of distal LEFT vertebral artery via muscular branches).  Widely patent RIGHT Common carotid artery, RIGHT internal carotid artery and RIGHT vertebral artery.  CTA HEAD: Chronically occluded RIGHT carotid terminus with minimal collateralization.  New mild stenosis LEFT M1 segment. New occluded distal LEFT posterior cerebral artery.  Chronically occluded RIGHT M1 origin, collaterals contributing to RIGHT middle cerebral artery.   Electronically Signed   By: Elon Alas M.D.   On: 07/01/2015 05:19   Mr Brain Wo Contrast  07/01/2015   ADDENDUM REPORT: 07/01/2015 05:21  ADDENDUM: Small area LEFT mesial occipital lobe encephalomalacia consistent with old LEFT posterior cerebral artery territory infarct.   Electronically Signed   By: Elon Alas M.D.   On: 07/01/2015 05:21   07/01/2015   CLINICAL DATA:  Transient LEFT vision loss for 15 minutes. Mild headache. History of multiple strokes, it inferior vena cava filter, on Lovenox, possible Takayasu's arteritis.  EXAM: MRI HEAD WITHOUT CONTRAST  TECHNIQUE: Multiplanar, multiecho pulse sequences of  the brain and  surrounding structures were obtained without intravenous contrast.  COMPARISON:  CT head June 30, 2015 and MRI of the head Mar 27, 2013  FINDINGS: 7 mm round focus of reduced diffusion LEFT caudate head, corresponding low ADC values and faint FLAIR T2 hyperintense signal. No susceptibility artifact to suggest acute hemorrhage.  RIGHT insular, frontoparietal encephalomalacia extending to the basal ganglia consistent with old infarct, faint associated susceptibility artifact most consistent with mineralization. Ex vacuo dilatation RIGHT lateral ventricle, the ventricles and sulci are otherwise normal for patient's age. Minimal white matter changes exclusive of the aforementioned abnormality compatible with chronic small vessel ischemic disease. No midline shift, mass effect or mass lesions.  No abnormal extra-axial fluid collections. Chronic LEFT internal carotid artery occlusion. Chronically occluded LEFT V4 segment.  Paranasal sinuses and mastoid air cells are well aerated. Ocular globes and orbital contents are normal. No abnormal sellar expansion. No cerebellar tonsillar ectopia. No suspicious calvarial bone marrow signal.  IMPRESSION: Acute subcentimeter LEFT caudate infarct.  Old RIGHT middle cerebral artery territory infarct.  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  Electronically Signed: By: Elon Alas M.D. On: 07/01/2015 04:06   Medications: I have reviewed the patient's current medications. Scheduled Meds: . aspirin  325 mg Oral Daily  . calcium-vitamin D  1 tablet Oral Daily  . enoxaparin  120 mg Subcutaneous QPM  . folic acid  1 mg Oral Daily  . [START ON 07/02/2015] methotrexate  20 mg Oral Q Sat  . multivitamin with minerals  1 tablet Oral Daily  . potassium chloride  40 mEq Oral BID  . predniSONE  7.5 mg Oral Daily  . sodium chloride  3 mL Intravenous Q12H   Continuous Infusions:  PRN Meds:.temazepam Assessment/Plan: Principal Problem:   Amaurosis  fugax of left eye Active Problems:   Iron deficiency anemia   TAKAYASU'S ARTERITIS   Embolism and thrombosis   CVA (cerebral infarction)   Essential hypertension, malignant 39 year old woman with history of previous strokes and h/o transient loss of vision, h/o DVT requiring IVC filter, and questionable history of Takasayu arteritis, presents with a 15 minute transient loss of vision in her left eye which has since returned to baseline- likely acute TIA. She is admitted for stroke workup.   Amaurosis Fugax involving left eye: Incident lasted 20 minutes without any other deficits consistent with TIA. likely because of the similar presentation in previous times per patient.-Unsure whether this is arterial or venous thrombosis. Pt failed multiple anticoag therapy in the past. -on tele - Will try for optho consult, or do outpatient appointment on Monday Per optho, Dr. Ellie Lunch- spoke to her via phone ([place]), she does not seem to think that this is an emergency as the episode of TIA has resolved- she recommended seeing the patient Monday morning in the office to get a thorough exam   -CTA head/neck results above- unrevealing except for left lacunar infarct which is new  -Echo and carotid dopplers pending -therapeutic Lovenox -telemetry and neuro checks  -PT- no recommendations - on aspirin 325 mg today, then 81 tomorrow  Her lupus anticoag result is positive which was done here in 2014, but the records at [facility] shows that she was tested negative for lupus anticoag, beta glycoprotein and anticardiolipin antibodies in May 2015. So it is likely that the result in 2014, may be a false positive. We are holding off on re-testing this as she had it tested twice.  Dvt: 1st occurrence was at age 65, associated with OCP use, then in 1995 had she was on heparin for 9 months during a pregnancy, and intermittently she continued to have recurrent blood clots and had an IVC filter  placed in 2008. Records show that she has not been quite compliant with anticoags in the past. She had lupus anticoagulant positivity, but protein C, S and antithrombin, factor 5 leiden, cardiolipin, and homocysteine workup has been normal in 2014.  On enoxaparin sq 120 mg - of note she was in the ER on 5/26 for neck pain and had a CT angio which was negative for PE  Hypokalemia: resolved after 40 meQ K yesterday  Vasculitis/ Takayasu?: not confirmed by biopsy. Steroids and HTX are modalities of treatment. 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  -on MTX and prednisone - on folic acid and vit d - She is a patient of Dr. Ouida Sills at [facility]  Insomnia- temazepam  #FEN:  -Diet: carb modified  #DVT prophylaxis: enoxaparin  #CODE STATUS: full code   Dispo: Disposition is deferred at this time, awaiting improvement of current medical problems.  Anticipated discharge in approximately 1-2 day(s).   The patient does have a current PCP (No Pcp Per Patient) and does need an [facility] hospital follow-up appointment after discharge.  The patient does not have transportation limitations that hinder transportation to clinic appointments.  .Services Needed at time of discharge: Y = Yes, Blank = No PT: N  OT:   RN:   Equipment:   Other:     LOS: 1 day   Burgess Estelle, MD 07/01/2015, 12:18 PM

## 2015-07-02 LAB — HIGH SENSITIVITY CRP: CRP, High Sensitivity: 18.57 mg/L — ABNORMAL HIGH (ref 0.00–3.00)

## 2015-07-19 ENCOUNTER — Ambulatory Visit: Payer: No Typology Code available for payment source | Admitting: Occupational Therapy

## 2015-07-19 ENCOUNTER — Ambulatory Visit: Payer: No Typology Code available for payment source | Attending: Psychiatry | Admitting: Physical Therapy

## 2015-07-19 ENCOUNTER — Encounter: Payer: Self-pay | Admitting: Physical Therapy

## 2015-07-19 ENCOUNTER — Encounter: Payer: Self-pay | Admitting: Occupational Therapy

## 2015-07-19 ENCOUNTER — Ambulatory Visit: Payer: Medicare Other | Admitting: Occupational Therapy

## 2015-07-19 DIAGNOSIS — R2681 Unsteadiness on feet: Secondary | ICD-10-CM | POA: Insufficient documentation

## 2015-07-19 DIAGNOSIS — G8110 Spastic hemiplegia affecting unspecified side: Secondary | ICD-10-CM | POA: Diagnosis present

## 2015-07-19 DIAGNOSIS — R4189 Other symptoms and signs involving cognitive functions and awareness: Secondary | ICD-10-CM | POA: Insufficient documentation

## 2015-07-19 DIAGNOSIS — R2689 Other abnormalities of gait and mobility: Secondary | ICD-10-CM

## 2015-07-19 DIAGNOSIS — R269 Unspecified abnormalities of gait and mobility: Secondary | ICD-10-CM | POA: Diagnosis present

## 2015-07-19 DIAGNOSIS — M6289 Other specified disorders of muscle: Secondary | ICD-10-CM

## 2015-07-19 DIAGNOSIS — Z5189 Encounter for other specified aftercare: Secondary | ICD-10-CM | POA: Diagnosis present

## 2015-07-19 DIAGNOSIS — I69898 Other sequelae of other cerebrovascular disease: Secondary | ICD-10-CM | POA: Insufficient documentation

## 2015-07-19 DIAGNOSIS — R531 Weakness: Secondary | ICD-10-CM | POA: Diagnosis present

## 2015-07-19 DIAGNOSIS — M6249 Contracture of muscle, multiple sites: Secondary | ICD-10-CM | POA: Insufficient documentation

## 2015-07-19 DIAGNOSIS — Z4789 Encounter for other orthopedic aftercare: Secondary | ICD-10-CM

## 2015-07-19 DIAGNOSIS — IMO0002 Reserved for concepts with insufficient information to code with codable children: Secondary | ICD-10-CM

## 2015-07-19 DIAGNOSIS — R29818 Other symptoms and signs involving the nervous system: Secondary | ICD-10-CM | POA: Diagnosis present

## 2015-07-19 NOTE — Therapy (Signed)
[facility] [address] [place], [place], [address] Phone: [phone]   Fax:  [phone]  Occupational Therapy Evaluation  Patient Details  Name: [name] MRN: [id number] Date of Birth: 1976-06-30 Referring Provider:  No ref. provider found  Encounter Date: 07/19/2015      OT End of Session - 07/19/15 [place]    Visit Number 1   Number of Visits 8   Authorization Type VA- Please refer to the following CPT codes - Neuromuscular reed, Ther act., ther. ex., orthotics (splinting), hot/cold pack   Authorization Time Period 07/18/2016   Authorization - Visit Number 1   Authorization - Number of Visits 8   OT Start Time 7628   OT Stop Time 1625   OT Time Calculation (min) 53 min   Activity Tolerance Patient tolerated treatment well   Behavior During Therapy [facility] for tasks assessed/performed;Flat affect      Past Medical History  Diagnosis Date  . PVD (peripheral vascular disease)   . Benign tumor of back   . Swelling, lymph nodes 2006    intermittant, benign  . DVT (deep venous thrombosis)   . Stroke     left sided deficits  . Arthritis     Takyasu Arthritis   . Anxiety     Past Surgical History  Procedure Laterality Date  . Ivc filter placement      TrapEase IVC filter Safe 1.5T  . Breast surgery      bil breast implants    There were no vitals filed for this visit.  Visit Diagnosis:  Spastic hemiplegia affecting nondominant side  Impaired cognition  Unsteadiness on feet      Subjective Assessment - 07/19/15 1541    Subjective  Get all movement back in my leftr hand   Patient is accompained by: Family member  Adam - husband   Currently in Pain? No/denies   Pain Score 0-No pain           [facility] OT Assessment - 07/19/15 1544    Assessment   Diagnosis multiple CVA   Onset Date --  Jan 2014   Prior Therapy yes - 2015   Precautions   Precautions Fall   Precaution Comments [place] in the  shower 04/20/15   Required Braces or Orthoses Other Brace/Splint   Other Brace/Splint Patient indicates she is getting a LE brace on Monday   Restrictions   Weight Bearing Restrictions No   Balance Screen   Has the patient fallen in the past 6 months Yes   How many times? 1   Has the patient had a decrease in activity level because of a fear of falling?  No   Is the patient reluctant to leave their home because of a fear of falling?  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    IADL   Prior Level of Function Light Housekeeping independent   Light Housekeeping Needs help with all home maintenance tasks   Prior Level of Function Meal Prep independent   Meal Prep Does not utilize stove or oven;Needs to have meals prepared and served   Medication Management Is responsible for taking medication in correct dosages at correct time   Mobility   Mobility Status --  walks with a quad cane   Written Expression   Dominant Hand Right   Vision - History   Baseline Vision No visual  deficits   Visual History Other (comment)  reports 20 min loss of vision in L eye with onset of strokes   Vision Assessment   Eye Alignment Within Functional Limits   Vision Assessment --   Ocular Range of Motion Within Functional Limits   Alignment/Gaze Preference Within Defined Limits   Tracking/Visual Pursuits Able to track stimulus in all quads without difficulty   Saccades Within functional limits   Visual Fields No apparent deficits   Acuity Reading acuity (comment)  per patient report - no further difficulty   Cognition   Overall Cognitive Status Impaired/Different from baseline   Area of Impairment Following commands;Attention   Current Attention Level Selective   Attention Comments reports difficulty with concentration    Following Commands Follows one step commands inconsistently   Problem Solving Impaired   Problem Solving Impairment Functional basic;Verbal basic   Executive Function Reasoning;Organizing;Decision Making;Self Monitoring   Behaviors Poor frustration tolerance;Lability  Limited mental flexibility- easily frustrated,  Flat affect   Observation/Other Assessments   Focus on Therapeutic Outcomes (FOTO)  41/100 Physical FS score   Sensation   Light Touch Appears Intact   Coordination   Gross Motor Movements are Fluid and Coordinated No   Fine Motor Movements are Fluid and Coordinated No   Coordination and Movement Description synergistic movement noted only in shoulder   Finger Nose Finger Test unable   9 Hole Peg Test --  unable   Box and Blocks --  unable   Coordination Patient limited in left UE coordination by spastic hemiplegia   ROM / Strength   AROM / PROM / Strength PROM   PROM   Overall PROM  Deficits   Overall PROM Comments Left wrist and digits need increased time but full passive range available.  Full PROM throughout   PROM Assessment Site --   Right/Left Shoulder --   Left Shoulder Flexion --   Left Shoulder ABduction --   Right/Left  Elbow --   Hand Function   Right Hand Gross Grasp Functional   Right Hand Grip (lbs) 70   Right Hand Lateral Pinch 20 lbs   Left Hand Gross Grasp Impaired   Left Hand Lateral Pinch 1 lbs   LUE Tone   LUE Tone Moderate                         OT Education - 07/19/15 1827    Education provided Yes   Education Details Worked with patient to determine specific functional goals for patient to improve independence and autonomy with ADL.     Person(s) Educated Patient;Spouse   Methods Explanation;Verbal cues  Comprehension Verbal cues required             OT Long Term Goals - 07/19/15 1839    OT LONG TERM GOAL #1   Title Patient will utilize her left hand to hold / stabilize her phone while texting, dialing with her right hand (due 09/22/15)   Time 8   Period Weeks  visits   OT LONG TERM GOAL #2   Title Patient will utilize bilateral upper extremities to complete a simple homemaking task, e.g. folding a towel, placing pillow in pillow case.     Time 8   Period Weeks  visits   Status New   OT LONG TERM GOAL #3   Title Patient will utilize left hand to open a door handle to allow her to maintain right hand on cane when walking through doorway   Time 8   Period Weeks  visits   Status New   OT LONG TERM GOAL #4   Title Patient will utilze left hand to carry an item with hooked hand e.g. grocery bag (light) to allow her to unload groceries, etc, while walking with cane in right hand   Time 8   Period Weeks  visits   Status New   OT LONG TERM GOAL #5   Title Patient will be independent in completing home exercise program for left upper extremity   Time 8   Period Weeks  visits   Status New               Plan - 07/19/15 1832    Clinical Impression Statement Patient is a 39 year old female with history of multiple CVA's dating back to 11/2012, which have left her with the following impairments; left UE spastic hemiplegia, decreased balance, urinary  incontinence, decreased cognition- attention, problem solving, mental flexibility and therefore she needs intermittent assistance with basic self care skills, and she is rarely able to participate in IADL.     Pt will benefit from skilled therapeutic intervention in order to improve on the following deficits (Retired) Decreased balance;Decreased cognition;Decreased coordination;Decreased range of motion;Decreased strength;Increased muscle spasms;Impaired perceived functional ability;Improper body mechanics;Impaired UE functional use;Impaired tone   Rehab Potential Good   OT Frequency 1x / week   OT Duration 8 weeks   OT Treatment/Interventions Self-care/ADL training;Cryotherapy;Moist Heat;Therapeutic exercise;Neuromuscular education;Therapeutic activities;Therapeutic exercises;Splinting  Please maintain these CPT codes   Plan Initiate a home exercise program to ensure adequate stretching to Left UE - specifically wrist and digits.  Assess prior splints if present.  Neuromuscular reed to left UE   Consulted and Agree with Plan of Care Patient;Family member/caregiver   Family Member Consulted Husband Adam        Problem List Patient Active Problem List   Diagnosis Date Noted  . HLD (hyperlipidemia)   . Spastic hemiplegia affecting nondominant side 09/10/2013  . Cognitive deficits, late effect of cerebrovascular disease 09/10/2013  . Embolic infarction 03/55/9741  . Postprocedural respiratory failure 02/16/2013  . Essential hypertension, malignant 02/16/2013  . Sinus tachycardia 02/16/2013  . CVA (cerebral infarction) 12/05/2012  . Subtherapeutic international normalized ratio (INR) 12/05/2012  . DEPRESSION 09/04/2006  . 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 CONSTIPATION 09/04/2006  . OVARIAN CYST 09/04/2006  . FOOT DROP, LEFT 09/04/2006    Mariah Milling, OTR/L 07/19/2015, 6:54 PM  [facility] [address] [place], [place], [address] Phone: [phone]   Fax:  [phone]

## 2015-07-20 NOTE — Therapy (Signed)
[facility] [address] [place], [place], [address] Phone: [phone]   Fax:  [phone]  Physical Therapy Evaluation  Patient Details  Name: [name] MRN: [id number] Date of Birth: 03/06/1976 Referring Provider:  No ref. provider found  Encounter Date: 07/19/2015      PT End of Session - 07/19/15 1400    Visit Number 1   Number of Visits 8   Date for PT Re-Evaluation 09/16/15   Authorization Type VA approved 8 PT visits 07/19/2015 - 07/18/2016   PT Start Time 1400   PT Stop Time 1444   PT Time Calculation (min) 44 min   Equipment Utilized During Treatment Gait belt   Activity Tolerance Patient tolerated treatment well   Behavior During Therapy WFL for tasks assessed/performed      Past Medical History  Diagnosis Date  . PVD (peripheral vascular disease)   . Benign tumor of back   . Swelling, lymph nodes 2006    intermittant, benign  . DVT (deep venous thrombosis)   . Stroke     left sided deficits  . Arthritis     Takyasu Arthritis   . Anxiety     Past Surgical History  Procedure Laterality Date  . Ivc filter placement      TrapEase IVC filter Safe 1.5T  . Breast surgery      bil breast implants    There were no vitals filed for this visit.  Visit Diagnosis:  Abnormality of gait  Unsteadiness  Balance problems  Weakness due to cerebrovascular accident  Encounter for training in use of orthotic device  Muscle hypertonia      Subjective Assessment - 07/19/15 1410    Subjective This 39yo female with history of multiple CVAs. First Jan 2014, with second March 2014 resulting in left hemiplegia and most recent CVA 06/30/2015. She presents for PT evaluation. She reports getting new AFO on next Monday, 07/25/2015.   Patient is accompained by: Family member   Patient Stated Goals To walk without cane.    Currently in Pain? No/denies            [facility] PT Assessment - 07/19/15 1400     Assessment   Medical Diagnosis CVA   Onset Date/Surgical Date 06/30/15   Precautions   Precautions Fall   Restrictions   Weight Bearing Restrictions No   Balance Screen   Has the patient fallen in the past 6 months Yes   How many times? 1  medicated & took shower   Has the patient had a decrease in activity level because of a fear of falling?  Yes   Is the patient reluctant to leave their home because of a fear of falling?  Yes   [place] Private residence   Living Arrangements Spouse/significant other   Type of [place] to enter   Entrance Stairs-Number of Steps 3   Entrance Stairs-Rails [place] Two level;1/2 bath on main level   Alternate Level Stairs-Number of Steps 15-20   Alternate Level Stairs-Rails Left   Home Equipment Cane - quad;Wheelchair - manual   Prior Function   Level of Independence Independent with household mobility with device;Independent with household mobility without device;Independent with community mobility with device;Independent with community mobility without device  no device full function prior CVA, LBQC since 2nd CVA   Observation/Other Assessments   Focus on Therapeutic Outcomes (FOTO)  41  Functional Status   Stroke Impact Scale  25% mobililty, 0% hand   Fear Avoidance Belief Questionnaire (FABQ)  65%   Coordination   Gross Motor Movements are Fluid and Coordinated No  left LE has dysmteria with movement   Posture/Postural Control   Posture/Postural Control Postural limitations   Postural Limitations Rounded Shoulders;Forward head;Flexed trunk;Weight shift right   Tone   Assessment Location Left Lower Extremity   ROM / Strength   AROM / PROM / Strength PROM;Strength   PROM   Overall PROM  Deficits   PROM Assessment Site Ankle   Right/Left Ankle Left   Left Ankle Dorsiflexion -4  with slow prolonged stretch to break up tone   Left Ankle Eversion -5  after slow  prolonged stretch to break up tone   Strength   Overall Strength Deficits   Overall Strength Comments trunk grossly WNL tested in sittting   Strength Assessment Site Hip;Knee;Ankle   Right/Left Hip Left   Left Hip Flexion 3+/5   Left Hip Extension 2/5   Left Hip ABduction 3-/5   Right/Left Knee Left   Left Knee Flexion 3-/5   Left Knee Extension 3+/5   Right/Left Ankle Left   Left Ankle Dorsiflexion 1/5  synergy   Transfers   Transfers Sit to Stand;Stand to Sit   Sit to Stand 5: Supervision;Without upper extremity assist;From chair/3-in-1  supervision without UE, modified indep. with UE   Sit to Stand Details (indicate cue type and reason) wt shift to right so decreased LLE assist   Stand to Sit 5: Supervision;Without upper extremity assist;To chair/3-in-1  supervision without UE, Mod. Indep. with UE   Stand to Sit Details wt shift right with decreased LLE use   Ambulation/Gait   Ambulation/Gait Yes   Ambulation/Gait Assistance 5: Supervision   Ambulation/Gait Assistance Details L ankle with improper position to accept with wt leading to poor stance stabilization and limited clearance in swing.   With left ankle stablized properly in new AFO, needs assess   Ambulation Distance (Feet) 125 Feet   Assistive device Large base quad cane  needs AFO to test with less supportive devices   Gait Pattern Step-to pattern;Decreased arm swing - left;Decreased step length - right;Decreased stance time - left;Decreased stride length;Decreased hip/knee flexion - left;Decreased dorsiflexion - left;Decreased weight shift to left;Left hip hike;Left steppage;Left flexed knee in stance;Lateral trunk lean to right;Trunk rotated posteriorly on left;Trunk flexed;Abducted - left;Poor foot clearance - left   Ambulation Surface Indoor;Level   Gait velocity 1.32 ft/sec  <1.8 ft/sec indicates fall risk   Standardized Balance Assessment   Standardized Balance Assessment Berg Balance Test;Timed Up and Go Test    Berg Balance Test   Sit to Stand Able to stand  independently using hands   Standing Unsupported Able to stand safely 2 minutes   Sitting with Back Unsupported but Feet Supported on Floor or Stool Able to sit safely and securely 2 minutes   Stand to Sit Controls descent by using hands   Transfers Able to transfer safely, minor use of hands   Standing Unsupported with Eyes Closed Able to stand 10 seconds with supervision   Standing Ubsupported with Feet Together Needs help to attain position and unable to hold for 15 seconds   From Standing, Reach Forward with Outstretched Arm Can reach forward >12 cm safely (5")   From Standing Position, Pick up Object from Floor Able to pick up shoe safely and easily   From Standing Position, Turn to  Look Behind Over each Shoulder Looks behind one side only/other side shows less weight shift   Turn 360 Degrees Needs assistance while turning   Standing Unsupported, Alternately Place Feet on Step/Stool Needs assistance to keep from falling or unable to try   Standing Unsupported, One Foot in Front Needs help to step but can hold 15 seconds   Standing on One Leg Tries to lift leg/unable to hold 3 seconds but remains standing independently   Total Score 33   Timed Up and Go Test   Normal TUG (seconds) 24.84  24.84 sec on 1st attempt, 14.84sec on 2nd but unsafe   LLE Tone   LLE Tone Moderate;Severe;Hypertonic  moderate at knee, severe at ankle   LLE Tone   Hypertonic Details left ankle supination with plantarflexion, toes curl                             PT Short Term Goals - 07/19/15 1400    PT SHORT TERM GOAL #1   Title patient verbalizes proper use of new AFO with no skin issues. (Target Date: 08/19/2015)   Time 4   Period Weeks   Status New   PT SHORT TERM GOAL #2   Title patient ambulates 81' with LRAD & AFO with supervision.  (Target Date: 08/19/2015)   Time 4   Period Weeks   Status New   PT SHORT TERM GOAL #3   Title  patient negotiates stairs (1 rail), ramps & curbs with LRAD & new AFO with supervison.  (Target Date: 08/19/2015)   Time 4   Period Weeks   Status New           PT Long Term Goals - 07/19/15 1400    PT LONG TERM GOAL #1   Title patient tolerates wear of new AFO >75% of awake hours without skin issues or discomfort to enable functional use throughout the day.  (Target Date: 09/09/2015)   Time 8   Period Weeks   Status New   PT LONG TERM GOAL #2   Title patient ambulates >400' with LRAD & new AFO modified independent. (Target Date: 09/09/2015)   Time 8   Period Weeks   Status New   PT LONG TERM GOAL #3   Title Patient negotiates stairs (1 rail), ramps & curbs with LRAD & new AFO modified independent. (Target Date: 09/09/2015)   Time 8   Period Weeks   Status New   PT LONG TERM GOAL #4   Title Berg Balance with new AFO >45/56. (Target Date: 09/09/2015)   Time 8   Period Weeks   Status New   PT LONG TERM GOAL #5   Title Timed Up & Go with LRAD & new AFO <13.5 sec safely & independently. (Target Date: 09/09/2015)   Time 8   Period Weeks   Status New               Plan - 07/19/15 1400    Clinical Impression Statement This 39yo female has had decreased mobility since 2nd CVA March 2014 with left hemiplegia. She had a 5th CVA 06/30/2015 with decline in mobility. She wants to decrease use of assistive device. Her left ankle is not safe or supportiive enough  in position to ambulate without assistive device. She is getting a new AFO on next Monday, 07/25/2015. PT spoke with orthotist who is delivering a articutlating thermoplastic custom AFO with incrased support to decrease supination &  toe curling.    Pt will benefit from skilled therapeutic intervention in order to improve on the following deficits Abnormal gait;Decreased activity tolerance;Decreased balance;Decreased endurance;Decreased knowledge of use of DME;Decreased mobility;Decreased range of motion;Decreased  strength;Impaired tone;Postural dysfunction;Other (comment)  Orthotic Dependency   Rehab Potential Good   PT Frequency 1x / week   PT Duration 8 weeks   PT Treatment/Interventions ADLs/Self Care Home Management;DME Instruction;Gait training;Stair training;Functional mobility training;Therapeutic activities;Therapeutic exercise;Balance training;Neuromuscular re-education;Patient/family education;Orthotic Fit/Training;Manual techniques   PT Next Visit Plan Assess gait with new AFO   Consulted and Agree with Plan of Care Patient;Family member/caregiver   Family Member Consulted husband, Ulanda Edison - August 05, 2015 1400    Functional Assessment Tool Used Berg Balance 33/56, Timed Up & Go 23.62 sec with LBQC   Functional Limitation Mobility: Walking and moving around   Mobility: Walking and Moving Around Current Status [phone]) At least 40 percent but less than 60 percent impaired, limited or restricted   Mobility: Walking and Moving Around Goal Status [phone]) At least 20 percent but less than 40 percent impaired, limited or restricted       Problem List Patient Active Problem List   Diagnosis Date Noted  . HLD (hyperlipidemia)   . Spastic hemiplegia affecting nondominant side 09/10/2013  . Cognitive deficits, late effect of cerebrovascular disease 09/10/2013  . Embolic infarction 49/67/5916  . Postprocedural respiratory failure 02/16/2013  . Essential hypertension, malignant 02/16/2013  . Sinus tachycardia 02/16/2013  . CVA (cerebral infarction) 12/05/2012  . Subtherapeutic international normalized ratio (INR) 12/05/2012  . DEPRESSION 09/04/2006  . 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 CONSTIPATION 09/04/2006  . OVARIAN CYST 09/04/2006  . FOOT DROP, LEFT 09/04/2006    Jamey Reas PT, DPT 07/20/2015, 12:00 PM  [facility] [address] [place], [place], [address] Phone: [phone]   Fax:  [phone]

## 2015-07-21 NOTE — Addendum Note (Signed)
Addended by: Isaias Cowman on: 07/21/2015 11:48 AM   Modules accepted: Orders

## 2015-07-22 NOTE — Addendum Note (Signed)
Addended by: Mariah Milling on: 07/22/2015 04:24 PM   Modules accepted: Orders

## 2015-07-22 NOTE — Addendum Note (Signed)
Addended by: Isaias Cowman on: 07/22/2015 05:56 PM   Modules accepted: Orders

## 2015-08-04 ENCOUNTER — Ambulatory Visit: Payer: 59 | Admitting: Occupational Therapy

## 2015-08-04 ENCOUNTER — Ambulatory Visit: Payer: 59 | Admitting: Physical Therapy

## 2015-08-11 ENCOUNTER — Ambulatory Visit: Payer: 59 | Admitting: Physical Therapy

## 2015-08-11 ENCOUNTER — Encounter: Payer: 59 | Admitting: Occupational Therapy

## 2015-08-16 ENCOUNTER — Ambulatory Visit: Payer: Self-pay | Admitting: Neurology

## 2015-08-18 ENCOUNTER — Ambulatory Visit: Payer: 59 | Admitting: Physical Therapy

## 2015-08-18 ENCOUNTER — Encounter: Payer: Self-pay | Admitting: Physical Therapy

## 2015-08-18 ENCOUNTER — Ambulatory Visit: Payer: 59 | Attending: Psychiatry | Admitting: Occupational Therapy

## 2015-08-18 ENCOUNTER — Encounter: Payer: Self-pay | Admitting: Occupational Therapy

## 2015-08-18 DIAGNOSIS — Z5189 Encounter for other specified aftercare: Secondary | ICD-10-CM | POA: Diagnosis present

## 2015-08-18 DIAGNOSIS — R2681 Unsteadiness on feet: Secondary | ICD-10-CM | POA: Insufficient documentation

## 2015-08-18 DIAGNOSIS — I69898 Other sequelae of other cerebrovascular disease: Secondary | ICD-10-CM | POA: Insufficient documentation

## 2015-08-18 DIAGNOSIS — R269 Unspecified abnormalities of gait and mobility: Secondary | ICD-10-CM

## 2015-08-18 DIAGNOSIS — R531 Weakness: Secondary | ICD-10-CM | POA: Insufficient documentation

## 2015-08-18 DIAGNOSIS — R29818 Other symptoms and signs involving the nervous system: Secondary | ICD-10-CM | POA: Insufficient documentation

## 2015-08-18 DIAGNOSIS — R2689 Other abnormalities of gait and mobility: Secondary | ICD-10-CM

## 2015-08-18 DIAGNOSIS — G8110 Spastic hemiplegia affecting unspecified side: Secondary | ICD-10-CM | POA: Diagnosis not present

## 2015-08-18 DIAGNOSIS — R4189 Other symptoms and signs involving cognitive functions and awareness: Secondary | ICD-10-CM | POA: Diagnosis present

## 2015-08-18 DIAGNOSIS — IMO0002 Reserved for concepts with insufficient information to code with codable children: Secondary | ICD-10-CM

## 2015-08-18 DIAGNOSIS — Z4789 Encounter for other orthopedic aftercare: Secondary | ICD-10-CM

## 2015-08-18 NOTE — Patient Instructions (Signed)
Toe / Heel Raise (Sitting)   Sitting, raise heels, then rock back on heels and raise toes. Repeat __10__ times. 2 times a day Copyright  VHI. All rights reserved.

## 2015-08-18 NOTE — Therapy (Signed)
[facility] [address] [place], [place], [address] Phone: [phone]   Fax:  [phone]  Occupational Therapy Treatment  Patient Details  Name: [name] MRN: [id number] Date of Birth: 07-04-1976 Referring Provider:  No ref. provider found  Encounter Date: 08/18/2015      OT End of Session - 08/18/15 1635    Activity Tolerance Patient tolerated treatment well   Behavior During Therapy [facility] for tasks assessed/performed      Past Medical History  Diagnosis Date  . PVD (peripheral vascular disease)   . Benign tumor of back   . Swelling, lymph nodes 2006    intermittant, benign  . DVT (deep venous thrombosis)   . Stroke     left sided deficits  . Arthritis     Takyasu Arthritis   . Anxiety     Past Surgical History  Procedure Laterality Date  . Ivc filter placement      TrapEase IVC filter Safe 1.5T  . Breast surgery      bil breast implants    There were no vitals filed for this visit.  Visit Diagnosis:  Spastic hemiplegia affecting nondominant side  Impaired cognition      Subjective Assessment - 08/18/15 1554    Subjective  Tell me honestly- do you think I will get this hand back?   Currently in Pain? No/denies   Pain Score 0-No pain                      OT Treatments/Exercises (OP) - 08/18/15 0001    Cognitive Exercises   Other Cognitive Exercises 1 Patient requesting to receive treatment in quiet environment due to her distractibility.  Patient better able to attend to neuro re-ed in quiet environment.  Patient shared that her MD feels she is depressed, and she is seeing a specialist tomorrow.  Patient concerned regarding crying at times when not necessarilly sad.  Discussed emotional lability as possible product of  stroke.  Patient with very flat affect,and also admitted very high anxiety.  Patient expressing concern regarding her ability to communicate with others - I feel  I offend som epeople, and I don't have a sense of humor anymore.     Neurological Re-education Exercises   Other Exercises 1 Neuromuscular reeducation to address isolated movement ability in left arm.  Patient has nearly full passive range of motion in all joints, yet moves in synergistic pattern, internal rotation, adduction at humerus, flexion at elbow, wrist and digit flexion.  Today's session focused on retraining volitional control of one joint at a time..  Patient able to isolate elbow flexion and extension - through 1/3 full range.  It is necessary for patient to be in a quiet environment to obtain isolated control as she is highly distractable -and aware of this.  Patient also able to isolate slight wrist extension, although with increased overflow activity.  Wrist extension, heavilly dominated by radial deviation, and the more emphasis on repeating movement, the less patient able to isolate.  Worked on more sustained contraction versus short bursts of muscle activity.     Other Exercises 2 Patient needs to rely on vision to aide with motor patterning for left arm, due to decreased sensory awareness of left hand.  Patient needs cueing to utilize vision as compensatory strategy.     Other Weight-Bearing Exercises 1 Sidelying with weight bearing on left forearm with emphasis on activating scapular stabilizers.  Attempted seated long  arm weight bearing, but patient distracted by hand position and had difficulty allowing facilitation in this position.  Following weight bearing, faciltated begining functional grasp, release with forearm pro/supination on table top.  Patient requires increased time for all attempts at controlled movement.                  OT Education - 08/18/15 1635    Education Details Volitional control of elbow - active bending and straightening left elbow.  Discussed all long term goals   Person(s) Educated Patient   Methods Explanation;Demonstration   Comprehension  Verbalized understanding;Verbal cues required             OT Long Term Goals - 07/19/15 1839    OT LONG TERM GOAL #1   Title Patient will utilize her left hand to hold / stabilize her phone while texting, dialing with her right hand (due 09/22/15)   Time 8   Period Weeks  visits   OT LONG TERM GOAL #2   Title Patient will utilize bilateral upper extremities to complete a simple homemaking task, e.g. folding a towel, placing pillow in pillow case.     Time 8   Period Weeks  visits   Status New   OT LONG TERM GOAL #3   Title Patient will utilize left hand to open a door handle to allow her to maintain right hand on cane when walking through doorway   Time 8   Period Weeks  visits   Status New   OT LONG TERM GOAL #4   Title Patient will utilze left hand to carry an item with hooked hand e.g. grocery bag (light) to allow her to unload groceries, etc, while walking with cane in right hand   Time 8   Period Weeks  visits   Status New   OT LONG TERM GOAL #5   Title Patient will be independent in completing home exercise program for left upper extremity   Time 8   Period Weeks  visits   Status New               Plan - 08/18/15 1621    Clinical Impression Statement Patient is skeptical, yet committed to process for OT to attempt to regain some functional use of her left arm.  Discussed at length th eimportance of carryover of therapeutic concepts into daily activities for improved carryover.  Patient showing active movement out of synergistic pattern when actively attending to movement in quiet environment.  Reviewed all goals with patient and she is in agreement.     Pt will benefit from skilled therapeutic intervention in order to improve on the following deficits (Retired) Decreased balance;Decreased cognition;Decreased coordination;Decreased range of motion;Decreased strength;Increased muscle spasms;Impaired perceived functional ability;Improper body mechanics;Impaired  UE functional use;Impaired tone   Rehab Potential Good   OT Frequency 1x / week   OT Duration 8 weeks   OT Treatment/Interventions Self-care/ADL training;Cryotherapy;Moist Heat;Therapeutic exercise;Neuromuscular education;Therapeutic activities;Therapeutic exercises;Splinting   Plan Initiate a home exercise program, neuro reed to left UE    Consulted and Agree with Plan of Care Patient        Problem List Patient Active Problem List   Diagnosis Date Noted  . HLD (hyperlipidemia)   . Spastic hemiplegia affecting nondominant side 09/10/2013  . Cognitive deficits, late effect of cerebrovascular disease 09/10/2013  . Embolic infarction 41/93/7902  . Postprocedural respiratory failure 02/16/2013  . Essential hypertension, malignant 02/16/2013  . Sinus tachycardia 02/16/2013  . CVA (cerebral infarction)  12/05/2012  . Subtherapeutic international normalized ratio (INR) 12/05/2012  . DEPRESSION 09/04/2006  . 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 CONSTIPATION 09/04/2006  . OVARIAN CYST 09/04/2006  . FOOT DROP, LEFT 09/04/2006    Mariah Milling, OTR/L 08/18/2015, 4:38 PM  [facility] [address] [place], [place], [address] Phone: [phone]   Fax:  [phone]

## 2015-08-19 NOTE — Therapy (Signed)
[facility] [address] [place], [place], [address] Phone: [phone]   Fax:  [phone]  Physical Therapy Treatment  Patient Details  Name: [name] MRN: [id number] Date of Birth: [birth year] Referring Provider:  No ref. provider found  Encounter Date: 08/18/2015     08/18/15 1323  PT Visits / Re-Eval  Visit Number 2  Number of Visits 8  Date for PT Re-Evaluation 09/16/15  Authorization  Authorization Type [facility] approved 8 PT visits 07/19/2015 - 07/18/2016  PT Time Calculation  PT Start Time 1318  PT Stop Time 1400  PT Time Calculation (min) 42 min  PT - End of Session  Equipment Utilized During Treatment Gait belt  Activity Tolerance Patient tolerated treatment well  Behavior During Therapy WFL for tasks assessed/performed    Past Medical History  Diagnosis Date  . PVD (peripheral vascular disease)   . Benign tumor of back   . Swelling, lymph nodes 2006    intermittant, benign  . DVT (deep venous thrombosis)   . Stroke     left sided deficits  . Arthritis     Takyasu Arthritis   . Anxiety     Past Surgical History  Procedure Laterality Date  . Ivc filter placement      TrapEase IVC filter Safe 1.5T  . Breast surgery      bil breast implants    There were no vitals filed for this visit.  Visit Diagnosis:  Unsteadiness on feet  Abnormality of gait  Unsteadiness  Balance problems  Weakness due to cerebrovascular accident  Encounter for training in use of orthotic device     08/18/15 1322  Symptoms/Limitations  Subjective No new complaints. No pain or falls to report. Has new brace, reports it does not fit and can't get it on with one hand.  Pain Assessment  Currently in Pain? No/denies   Treatment: Pt brought in new custom articulating brace. Able to don brace out of shoe, however then unable to get shoe on. If brace was placed in shoe, unable to get pt's foot into brace/shoe. Call  placed to orthotist who is willing to adjust brace, pt needs to set up appointment for this. Then later in session with discussion with PT, was decided to see if orthotist will come to pt's next session for full orthotic consult along with PT to find a brace that gives the stability needed and that pt can don herself. Pt also educated that her elastic shoe strings will not work with any brace as they do not allow for stability to keep foot seated into brace.  Trialed lace up ankle brace with moderate improvement in ankle stability, however no improvement in foot drop.    Exercises: Seated heel/toe raises for ankle strengthening, issued as HEP.  Increased time with all education due to pt's cognitive status.      PT Short Term Goals - 07/19/15 1400    PT SHORT TERM GOAL #1   Title patient verbalizes proper use of new AFO with no skin issues. (Target Date: 08/19/2015)   Time 4   Period Weeks   Status New   PT SHORT TERM GOAL #2   Title patient ambulates 28' with LRAD & AFO with supervision.  (Target Date: 08/19/2015)   Time 4   Period Weeks   Status New   PT SHORT TERM GOAL #3   Title patient negotiates stairs (1 rail), ramps & curbs with LRAD & new AFO with  supervison.  (Target Date: 08/19/2015)   Time 4   Period Weeks   Status New           PT Long Term Goals - 07/19/15 1400    PT LONG TERM GOAL #1   Title patient tolerates wear of new AFO >75% of awake hours without skin issues or discomfort to enable functional use throughout the day.  (Target Date: 09/09/2015)   Time 8   Period Weeks   Status New   PT LONG TERM GOAL #2   Title patient ambulates >400' with LRAD & new AFO modified independent. (Target Date: 09/09/2015)   Time 8   Period Weeks   Status New   PT LONG TERM GOAL #3   Title Patient negotiates stairs (1 rail), ramps & curbs with LRAD & new AFO modified independent. (Target Date: 09/09/2015)   Time 8   Period Weeks   Status New   PT LONG TERM GOAL #4   Title  Berg Balance with new AFO >45/56. (Target Date: 09/09/2015)   Time 8   Period Weeks   Status New   PT LONG TERM GOAL #5   Title Timed Up & Go with LRAD & new AFO <13.5 sec safely & independently. (Target Date: 09/09/2015)   Time 8   Period Weeks   Status New        08/18/15 1323  Plan  Clinical Impression Statement Unable to don pt's new brace with shoe without increased pain reported duirng donning. Pt upset that shoe strings have to be loosened to allow brace to fit in shoe. Educated pt that elastic shoe strings will not work with braces anyway. Call placed to orthotist who will make adjustments to brace. Now after discussion with PT, will try and get orthotist to come to pt's next appointment for orthotict consult along with PT. Added seated heel/toe raises to pt's HEP. Pt limited in progress toward goals due to attendance and bracing issues.                                                             Pt will benefit from skilled therapeutic intervention in order to improve on the following deficits Abnormal gait;Decreased activity tolerance;Decreased balance;Decreased endurance;Decreased knowledge of use of DME;Decreased mobility;Decreased range of motion;Decreased strength;Impaired tone;Postural dysfunction;Other (comment) (Orthotic Dependency)  Rehab Potential Good  PT Frequency 1x / week  PT Duration 8 weeks  PT Treatment/Interventions ADLs/Self Care Home Management;DME Instruction;Gait training;Stair training;Functional mobility training;Therapeutic activities;Therapeutic exercise;Balance training;Neuromuscular re-education;Patient/family education;Orthotic Fit/Training;Manual techniques  PT Next Visit Plan Assess gait with mulitple braces to determing best option for foot drop and ankle instability. Add to HEP.  Consulted and Agree with Plan of Care Patient    Problem List Patient Active Problem List   Diagnosis Date Noted  . HLD (hyperlipidemia)   . Spastic hemiplegia affecting  nondominant side 09/10/2013  . Cognitive deficits, late effect of cerebrovascular disease 09/10/2013  . Embolic infarction 36/14/4315  . Postprocedural respiratory failure 02/16/2013  . Essential hypertension, malignant 02/16/2013  . Sinus tachycardia 02/16/2013  . CVA (cerebral infarction) 12/05/2012  . Subtherapeutic international normalized ratio (INR) 12/05/2012  . DEPRESSION 09/04/2006  . 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 CONSTIPATION 09/04/2006  . OVARIAN CYST 09/04/2006  . FOOT DROP, LEFT 09/04/2006  Willow Ora 08/19/2015, 11:46 PM  Willow Ora, PTA, [facility] [address] [place], [place] [address] [phone] 08/19/2015, 11:46 PM

## 2015-08-25 ENCOUNTER — Encounter: Payer: 59 | Admitting: Occupational Therapy

## 2015-08-25 ENCOUNTER — Encounter: Payer: Self-pay | Admitting: Occupational Therapy

## 2015-08-25 ENCOUNTER — Ambulatory Visit: Payer: 59 | Admitting: Physical Therapy

## 2015-08-25 ENCOUNTER — Ambulatory Visit: Payer: 59 | Admitting: Occupational Therapy

## 2015-08-25 DIAGNOSIS — G8110 Spastic hemiplegia affecting unspecified side: Secondary | ICD-10-CM | POA: Diagnosis not present

## 2015-08-25 NOTE — Patient Instructions (Signed)
Lie on your left side with your left arm resting on the bed, slightly away from your body. Look at your arm!   This will help you know if you are moving it in the way you want to move it. GENTLY bend your elbow, so your hand moves upward toward your face - go less than half way toward your face. STOP bending your arm, and allow your arm to return to the starting position on the bed.  (Give it time!)  -The emphasis needs to be on the arm returning to a straight position - and this takes time. Remember to try breathing to relax your tight elbow muscles.    Complete 5 repetitions each am and pm.

## 2015-08-25 NOTE — Therapy (Addendum)
[facility] [address] [place], [place], [address] Phone: [phone]   Fax:  [phone]  Occupational Therapy Treatment  Patient Details  Name: [name] MRN: [id number] Date of Birth: [birth year] Referring Provider:  No ref. provider found  Encounter Date: 08/25/2015      OT End of Session - 08/25/15 1631    Visit Number 3   Number of Visits 8   Authorization Type VA- Please refer to the following CPT codes - Neuromuscular reed, Ther act., ther. ex., orthotics (splinting), hot/cold pack   Authorization Time Period 07/18/2016   Authorization - Visit Number 3   Authorization - Number of Visits 8   OT Start Time 5638   OT Stop Time 1530   OT Time Calculation (min) 45 min   Activity Tolerance Patient tolerated treatment well   Behavior During Therapy [facility] for tasks assessed/performed      Past Medical History  Diagnosis Date  . PVD (peripheral vascular disease)   . Benign tumor of back   . Swelling, lymph nodes 2006    intermittant, benign  . DVT (deep venous thrombosis)   . Stroke     left sided deficits  . Arthritis     Takyasu Arthritis   . Anxiety     Past Surgical History  Procedure Laterality Date  . Ivc filter placement      TrapEase IVC filter Safe 1.5T  . Breast surgery      bil breast implants    There were no vitals filed for this visit.  Visit Diagnosis:  Spastic hemiplegia affecting nondominant side      Subjective Assessment - 08/25/15 1614    Subjective  It is like someone is holding my hand down.  I feel like I am dragging someone.   Currently in Pain? No/denies   Pain Score 0-No pain                      OT Treatments/Exercises (OP) - 08/25/15 0001    Neurological Re-education Exercises   Other Exercises 1 Neuromuscular reeducation to address tone reduction techniques in shoulder (internal rotation) elbow (flexion), wrist and digits (flexion) to begin to  exstablish activation of antagonist muscle groups. Patient has diminished sensation so encouraged to use vision and imagery to help coordinate movement.  In elbow, noting improved consistency with controlled relaxation of bicep, in wrist noting improved consistency of wrist extension.  Patient with best result following weight bearing on forearm, and hand to reduce tone overall.  Patient rreturns to synergistic motor patterns as soon as motorically challenged, e.g. walking, although not addressed as patient is still working to obtain / modify AFO.     Splinting   Splinting In prior therapy here, patient had resting hand splint fabricated.  Patient is no longer wearing this splint as initially she had to get up frequently during the night to urinate.  Patient indicates this is no longer a problem.  Patient indicates that her hand is the tightest when she first wakes up in the am.  Patient asked to bring in splint to assess its fit at this time.  Patient feels that would be difficult to remember, adn also she does not wish to travel back upstairs to obtain the splint to bring to therapy.  She was instructed to attempt to wear the splint for one hour, remove it and check for redness, swelling, etc.  If no difficulty, patient instructed to wear  splint as tolerated at night.  Also discussed possible splint to encourage functional opposition in hand.                  OT Education - 08/25/15 1630    Education provided Yes   Education Details control of elbow, splint wear for night time to reduce hand clawing / fisting   Person(s) Educated Patient   Methods Explanation;Demonstration   Comprehension Verbal cues required             OT Long Term Goals - 07/19/15 1839    OT LONG TERM GOAL #1   Title Patient will utilize her left hand to hold / stabilize her phone while texting, dialing with her right hand (due 09/22/15)   Time 8   Period Weeks  visits   OT LONG TERM GOAL #2   Title Patient  will utilize bilateral upper extremities to complete a simple homemaking task, e.g. folding a towel, placing pillow in pillow case.     Time 8   Period Weeks  visits   Status New   OT LONG TERM GOAL #3   Title Patient will utilize left hand to open a door handle to allow her to maintain right hand on cane when walking through doorway   Time 8   Period Weeks  visits   Status New   OT LONG TERM GOAL #4   Title Patient will utilze left hand to carry an item with hooked hand e.g. grocery bag (light) to allow her to unload groceries, etc, while walking with cane in right hand   Time 8   Period Weeks  visits   Status New   OT LONG TERM GOAL #5   Title Patient will be independent in completing home exercise program for left upper extremity   Time 8   Period Weeks  visits   Status New               Plan - 08/25/15 1632    Clinical Impression Statement Patient has long standing synergistic motor patterns that limit her ability to utilize her left arm, however, in quiet controlled environment, patient beginning to show volitional motor control which is pre-functional.     Pt will benefit from skilled therapeutic intervention in order to improve on the following deficits (Retired) Decreased balance;Decreased cognition;Decreased coordination;Decreased range of motion;Decreased strength;Increased muscle spasms;Impaired perceived functional ability;Improper body mechanics;Impaired UE functional use;Impaired tone   Rehab Potential Good   OT Frequency 1x / week   OT Duration 8 weeks   OT Treatment/Interventions Self-care/ADL training;Cryotherapy;Moist Heat;Therapeutic exercise;Neuromuscular education;Therapeutic activities;Therapeutic exercises;Splinting   Plan Continue HEP, Neuro re-ed to left UE   Consulted and Agree with Plan of Care Patient        Problem List Patient Active Problem List   Diagnosis Date Noted  . HLD (hyperlipidemia)   . Spastic hemiplegia affecting nondominant  side 09/10/2013  . Cognitive deficits, late effect of cerebrovascular disease 09/10/2013  . Embolic infarction 83/15/1761  . Postprocedural respiratory failure 02/16/2013  . Essential hypertension, malignant 02/16/2013  . Sinus tachycardia 02/16/2013  . CVA (cerebral infarction) 12/05/2012  . Subtherapeutic international normalized ratio (INR) 12/05/2012  . DEPRESSION 09/04/2006  . 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 CONSTIPATION 09/04/2006  . OVARIAN CYST 09/04/2006  . FOOT DROP, LEFT 09/04/2006    OCCUPATIONAL THERAPY DISCHARGE SUMMARY  Visits from Start of Care: 3  Current functional level related to goals / functional outcomes: As per OT Evaluation, patient with significant  increase in muscle tone in flexors of left hand/arm. Initiated tone reduction techniques, although incomplete plan of care   Remaining deficits: Spasticity left UE, Limited functional use Left UE   Education / Equipment: Discussed previous splint Plan:                                                    Patient goals were not met. Patient is being discharged due to not returning since the last visit.  ?????       Mariah Milling, OTR/L 08/25/2015, 4:36 PM  [facility] [address] [place], [place], [address] Phone: [phone]   Fax:  [phone]

## 2015-08-29 ENCOUNTER — Ambulatory Visit: Payer: 59 | Admitting: Occupational Therapy

## 2015-09-07 ENCOUNTER — Ambulatory Visit: Payer: 59 | Admitting: Occupational Therapy

## 2015-09-12 ENCOUNTER — Encounter (HOSPITAL_COMMUNITY): Payer: Self-pay | Admitting: Emergency Medicine

## 2015-09-12 ENCOUNTER — Emergency Department (HOSPITAL_COMMUNITY)
Admission: EM | Admit: 2015-09-12 | Discharge: 2015-09-12 | Disposition: A | Discharge status: Home / Self Care | Payer: 59 | Attending: Emergency Medicine | Admitting: Emergency Medicine

## 2015-09-12 DIAGNOSIS — R109 Unspecified abdominal pain: Secondary | ICD-10-CM | POA: Insufficient documentation

## 2015-09-12 DIAGNOSIS — Z86018 Personal history of other benign neoplasm: Secondary | ICD-10-CM | POA: Insufficient documentation

## 2015-09-12 DIAGNOSIS — Z7952 Long term (current) use of systemic steroids: Secondary | ICD-10-CM | POA: Diagnosis not present

## 2015-09-12 DIAGNOSIS — K5900 Constipation, unspecified: Secondary | ICD-10-CM | POA: Insufficient documentation

## 2015-09-12 DIAGNOSIS — Z8679 Personal history of other diseases of the circulatory system: Secondary | ICD-10-CM | POA: Diagnosis not present

## 2015-09-12 DIAGNOSIS — M1990 Unspecified osteoarthritis, unspecified site: Secondary | ICD-10-CM | POA: Diagnosis not present

## 2015-09-12 DIAGNOSIS — Z8673 Personal history of transient ischemic attack (TIA), and cerebral infarction without residual deficits: Secondary | ICD-10-CM | POA: Insufficient documentation

## 2015-09-12 DIAGNOSIS — Z8659 Personal history of other mental and behavioral disorders: Secondary | ICD-10-CM | POA: Diagnosis not present

## 2015-09-12 DIAGNOSIS — Z79899 Other long term (current) drug therapy: Secondary | ICD-10-CM | POA: Insufficient documentation

## 2015-09-12 DIAGNOSIS — Z7982 Long term (current) use of aspirin: Secondary | ICD-10-CM | POA: Insufficient documentation

## 2015-09-12 DIAGNOSIS — Z86718 Personal history of other venous thrombosis and embolism: Secondary | ICD-10-CM | POA: Diagnosis not present

## 2015-09-12 MED ORDER — HYDROCODONE-ACETAMINOPHEN 5-325 MG PO TABS: 1.0000 | ORAL_TABLET | Freq: Four times a day (QID) | ORAL | Status: DC | PRN

## 2015-09-12 NOTE — ED Provider Notes (Signed)
CSN: [id number]     Arrival date & time 09/12/15  1228 History   First MD Initiated Contact with Patient 09/12/15 1230     Chief Complaint  Patient presents with  . Flank Pain     (Consider location/radiation/quality/duration/timing/severity/associated sxs/prior Treatment) Patient is a 39 y.o. female presenting with flank pain. The history is provided by the patient.  Flank Pain This is a recurrent problem. The current episode started today. The problem occurs intermittently. The problem has been resolved. Pertinent negatives include no abdominal pain, chest pain, chills, coughing, fever, nausea or vomiting. The symptoms are aggravated by stress. She has tried oral narcotics for the symptoms. The treatment provided significant relief.   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 She reports that this occurs when she is on her cycle. She is currently not in any pain. The pain is 10/10 and achy when it occurs. Pain occurs is related to when she cries or laughs. She has been compliant with her medications and taken them today.   Past Medical History  Diagnosis Date  . PVD (peripheral vascular disease) ([place])   . Benign tumor of back   . Swelling, lymph nodes 2006    intermittant, benign  . DVT (deep venous thrombosis) ([place])   . Stroke [facility])     left sided deficits  . Arthritis     Takyasu Arthritis   . Anxiety    Past Surgical History  Procedure Laterality Date  . Ivc filter placement      TrapEase IVC filter Safe 1.5T  . Breast surgery      bil breast implants   Family History  Problem Relation Age of Onset  . Cancer - Other Father   . Coronary artery disease Mother   . Stroke Mother   . Hypertension Mother   . Hypertension Sister    Social History  Substance Use Topics  . Smoking status: Never Smoker   . Smokeless tobacco: Never Used  . Alcohol Use: No    OB History    Gravida Para Term Preterm AB TAB SAB Ectopic Multiple Living   1         1     Review of Systems  Constitutional: Negative for fever and chills.  Respiratory: Negative for cough and shortness of breath.   Cardiovascular: Negative for chest pain.  Gastrointestinal: Positive for constipation. Negative for nausea, vomiting, abdominal pain and diarrhea.  Genitourinary: Positive for flank pain. Negative for dysuria.      Allergies  Pork-derived products  Home Medications   Prior to Admission medications   Medication Sig Start Date End Date Taking? Authorizing Provider  aspirin EC 81 MG EC tablet Take 1 tablet (81 mg total) by mouth daily. 07/01/15  Yes Burgess Estelle, MD  CALCIUM CARBONATE-VITAMIN D PO Take 600 mg by mouth daily.    Yes Historical Provider, MD  folic acid (FOLVITE) 1 MG tablet Take 1 mg by mouth daily.   Yes Historical Provider, MD  fondaparinux (ARIXTRA) 7.5 MG/0.6ML SOLN injection Inject 7.5 mg into the skin every evening. 09/11/15  Yes Historical Provider, MD  Melatonin 5 MG TABS Take 5 mg by mouth at bedtime.   Yes Historical Provider, MD  Multiple Vitamin (MULTIVITAMIN) tablet Take 1 tablet by mouth daily.   Yes Historical Provider, MD  predniSONE (DELTASONE) 5 MG tablet Take 7.5 mg  by mouth daily. 04/18/15  Yes Historical Provider, MD  HYDROcodone-acetaminophen (NORCO/VICODIN) 5-325 MG tablet Take 1 tablet by mouth every 6 (six) hours as needed for moderate pain. 09/12/15   Rosemarie Ax, MD  methotrexate (RHEUMATREX) 2.5 MG tablet Take 20 mg by mouth once a week. Caution:Chemotherapy. Protect from light. **Saturdays**    Historical Provider, MD   BP 132/59 mmHg  Pulse 88  Temp(Src) 98.8 F (37.1 C) (Oral)  Resp 21  Ht 5\' 7"  (1.702 m)  Wt 170 lb (77.111 kg)  BMI 26.62 kg/m2  SpO2 100%  LMP 09/11/2015 Physical Exam  Constitutional: She is oriented to person, place, and time. She appears well-developed and well-nourished.  HENT:  Head:  Normocephalic and atraumatic.  Eyes: Conjunctivae and EOM are normal.  Neck: Normal range of motion. Neck supple.  Cardiovascular: Normal rate, regular rhythm, normal heart sounds and intact distal pulses.   No murmur heard. Pulmonary/Chest: Effort normal and breath sounds normal. No respiratory distress. She has no wheezes.  Abdominal: Soft. Bowel sounds are normal. She exhibits no distension. There is no tenderness. There is no rebound.  Musculoskeletal: She exhibits no edema.  Significant for left sided weakness   Neurological: She is alert and oriented to person, place, and time. No cranial nerve deficit.  Skin: Skin is warm. No rash noted.  Psychiatric: She has a normal mood and affect.    ED Course  Procedures (including critical care time) Labs Review Labs Reviewed  URINALYSIS, ROUTINE W REFLEX MICROSCOPIC (NOT AT [facility])  PREGNANCY, URINE    Imaging Review No results found. I have personally reviewed and evaluated these images and lab results as part of my medical decision-making.   EKG Interpretation None      MDM   Final diagnoses:  Flank pain   [name] is a 39 yo F that is presenting with right sided pain that has resolved since arriving to the ED. She is denying to give a urine sample and requesting to leave. She is stable for discharge. Given norco #6 for her pain so she will be able to follow up with the [place].   Rosemarie Ax, MD PGY-3, [place] [facility] 09/12/2015, 2:05 PM       Rosemarie Ax, MD 09/12/15 1406  Elnora Morrison, MD 09/13/15 [phone]

## 2015-09-12 NOTE — ED Notes (Signed)
Pt started having pain from right neck to right wait. Pain started last night. BP 180/110. Pt took her home dose of vicodin 5-325mg . 15 minutes prior to arrival. Pt has hx stroke affecting left side of body

## 2015-09-12 NOTE — Discharge Instructions (Signed)
Flank Pain Flank pain is pain in your side. The flank is the area of your side between your upper belly (abdomen) and your back. Pain in this area can be caused by many different things. [place] care and treatment will depend on the cause of your pain.  Rest as told by your doctor.  Drink enough fluids to keep your pee (urine) clear or pale yellow.  Only take medicine as told by your doctor.  Tell your doctor about any changes in your pain.  Follow up with your doctor. GET HELP RIGHT AWAY IF:   Your pain does not get better with medicine.   You have new symptoms or your symptoms get worse.  Your pain gets worse.   You have belly (abdominal) pain.   You are short of breath.   You always feel sick to your stomach (nauseous).   You keep throwing up (vomiting).   You have puffiness (swelling) in your belly.   You feel light-headed or you pass out (faint).   You have blood in your pee.  You have a fever or lasting symptoms for more than 2-3 days.  You have a fever and your symptoms suddenly get worse. MAKE SURE YOU:   Understand these instructions.  Will watch your condition.  Will get help right away if you are not doing well or get worse.   This information is not intended to replace advice given to you by your health care provider. Make sure you discuss any questions you have with your health care provider.   Document Released: 08/21/2008 Document Revised: 12/03/2014 Document Reviewed: 06/26/2012 Elsevier Interactive Patient Education [employer].

## 2015-12-10 ENCOUNTER — Emergency Department (HOSPITAL_COMMUNITY)
Admission: EM | Admit: 2015-12-10 | Discharge: 2015-12-10 | Disposition: A | Discharge status: Home / Self Care | Payer: 59 | Attending: Emergency Medicine | Admitting: Emergency Medicine

## 2015-12-10 ENCOUNTER — Encounter (HOSPITAL_COMMUNITY): Payer: Self-pay

## 2015-12-10 DIAGNOSIS — Z79899 Other long term (current) drug therapy: Secondary | ICD-10-CM | POA: Insufficient documentation

## 2015-12-10 DIAGNOSIS — Z8673 Personal history of transient ischemic attack (TIA), and cerebral infarction without residual deficits: Secondary | ICD-10-CM | POA: Diagnosis not present

## 2015-12-10 DIAGNOSIS — M546 Pain in thoracic spine: Secondary | ICD-10-CM | POA: Diagnosis not present

## 2015-12-10 DIAGNOSIS — N939 Abnormal uterine and vaginal bleeding, unspecified: Secondary | ICD-10-CM

## 2015-12-10 DIAGNOSIS — Z8659 Personal history of other mental and behavioral disorders: Secondary | ICD-10-CM | POA: Insufficient documentation

## 2015-12-10 DIAGNOSIS — Z86018 Personal history of other benign neoplasm: Secondary | ICD-10-CM | POA: Diagnosis not present

## 2015-12-10 DIAGNOSIS — D649 Anemia, unspecified: Secondary | ICD-10-CM

## 2015-12-10 DIAGNOSIS — Z86718 Personal history of other venous thrombosis and embolism: Secondary | ICD-10-CM | POA: Insufficient documentation

## 2015-12-10 DIAGNOSIS — M1990 Unspecified osteoarthritis, unspecified site: Secondary | ICD-10-CM | POA: Insufficient documentation

## 2015-12-10 DIAGNOSIS — D6489 Other specified anemias: Secondary | ICD-10-CM | POA: Insufficient documentation

## 2015-12-10 DIAGNOSIS — Z7952 Long term (current) use of systemic steroids: Secondary | ICD-10-CM | POA: Diagnosis not present

## 2015-12-10 DIAGNOSIS — Z7982 Long term (current) use of aspirin: Secondary | ICD-10-CM | POA: Diagnosis not present

## 2015-12-10 DIAGNOSIS — Z8679 Personal history of other diseases of the circulatory system: Secondary | ICD-10-CM | POA: Diagnosis not present

## 2015-12-10 DIAGNOSIS — Z7901 Long term (current) use of anticoagulants: Secondary | ICD-10-CM | POA: Insufficient documentation

## 2015-12-10 LAB — CBC WITH DIFFERENTIAL/PLATELET
BASOS PCT: 0 %
Basophils Absolute: 0 10*3/uL (ref 0.0–0.1)
EOS ABS: 0.1 10*3/uL (ref 0.0–0.7)
EOS PCT: 0 %
HCT: 26.4 % — ABNORMAL LOW (ref 36.0–46.0)
Hemoglobin: 8.2 g/dL — ABNORMAL LOW (ref 12.0–15.0)
LYMPHS ABS: 2.1 10*3/uL (ref 0.7–4.0)
Lymphocytes Relative: 14 %
MCH: 28.6 pg (ref 26.0–34.0)
MCHC: 31.1 g/dL (ref 30.0–36.0)
MCV: 92 fL (ref 78.0–100.0)
MONO ABS: 0.7 10*3/uL (ref 0.1–1.0)
MONOS PCT: 5 %
Neutro Abs: 11.5 10*3/uL — ABNORMAL HIGH (ref 1.7–7.7)
Neutrophils Relative %: 81 %
Platelets: 411 10*3/uL — ABNORMAL HIGH (ref 150–400)
RBC: 2.87 MIL/uL — ABNORMAL LOW (ref 3.87–5.11)
RDW: 17.6 % — ABNORMAL HIGH (ref 11.5–15.5)
WBC: 14.4 10*3/uL — ABNORMAL HIGH (ref 4.0–10.5)

## 2015-12-10 LAB — BASIC METABOLIC PANEL
Anion gap: 10 (ref 5–15)
BUN: 5 mg/dL — AB (ref 6–20)
CALCIUM: 8.9 mg/dL (ref 8.9–10.3)
CHLORIDE: 106 mmol/L (ref 101–111)
CO2: 25 mmol/L (ref 22–32)
CREATININE: 0.76 mg/dL (ref 0.44–1.00)
GFR calc non Af Amer: >60 mL/min (ref >60)
GLUCOSE: 89 mg/dL (ref 65–99)
Potassium: 3.5 mmol/L (ref 3.5–5.1)
Sodium: 141 mmol/L (ref 135–145)

## 2015-12-10 LAB — PREPARE RBC (CROSSMATCH)

## 2015-12-10 MED ORDER — MEGESTROL ACETATE 40 MG PO TABS: 80.0000 mg | ORAL_TABLET | Freq: Every day | ORAL | Status: DC

## 2015-12-10 MED ORDER — SODIUM CHLORIDE 0.9 % IV BOLUS (SEPSIS)
1000.0000 mL | Freq: Once | INTRAVENOUS | Status: AC
  Administered 2015-12-10: 1000 mL via INTRAVENOUS

## 2015-12-10 MED ORDER — MEGESTROL ACETATE 40 MG PO TABS
120.0000 mg | ORAL_TABLET | Freq: Every day | ORAL | Status: DC
  Administered 2015-12-10: 120 mg via ORAL
  Filled 2015-12-10 (×2): qty 3

## 2015-12-10 MED ORDER — SODIUM CHLORIDE 0.9 % IV SOLN
10.0000 mL/h | Freq: Once | INTRAVENOUS | Status: AC
Start: 2015-12-10 — End: 2015-12-10
  Administered 2015-12-10: 10 mL/h via INTRAVENOUS

## 2015-12-10 MED ORDER — ACETAMINOPHEN 325 MG PO TABS
650.0000 mg | ORAL_TABLET | Freq: Once | ORAL | Status: AC
Start: 2015-12-10 — End: 2015-12-10
  Administered 2015-12-10: 650 mg via ORAL
  Filled 2015-12-10: qty 2

## 2015-12-10 MED ORDER — KETOROLAC TROMETHAMINE 30 MG/ML IJ SOLN: 30.0000 mg | Freq: Once | INTRAMUSCULAR | Status: DC

## 2015-12-10 NOTE — ED Notes (Signed)
Pt verbalized understanding of d/c instructions, prescriptions, and follow-up care. No further questions/concerns, VSS, assisted to lobby in wheelchair.  

## 2015-12-10 NOTE — ED Notes (Signed)
Pt reports vaginal bleeding since Wednesday - intermittently heavy. Pt states she had period earlier this month, is not on birth control. Denies any other symptoms.

## 2015-12-10 NOTE — Discharge Instructions (Signed)
Please follow up with your OBGYN at their earliest appointment for discussion of your diagnoses and further evaluation after today's visit. If you cannot follow up with them on Monday or Tuesday, please follow up with the women's clinic listed above. You will need to have your H&H rechecked. Take megace as directed, drink plenty of fluids Return to the ED for worsening weakness, increased bleeding, any new or worsening symptoms, any additional concerns.

## 2015-12-10 NOTE — ED Notes (Signed)
Pt signed consent, copy at bedside and copy in medical records.

## 2015-12-10 NOTE — ED Provider Notes (Signed)
CSN: [id number]     Arrival date & time 12/10/15  1123 History   First MD Initiated Contact with Patient 12/10/15 1126     Chief Complaint  Patient presents with  . Vaginal Bleeding     (Consider location/radiation/quality/duration/timing/severity/associated sxs/prior Treatment) The history is provided by the patient and medical records. No language interpreter was used.    [name] is a 40 y.o. female  with a PMH of Takayasu arteritis, prior stroke, PVD, DVT who presents to the Emergency Department complaining of weakness and vaginal bleeding. Patient states she started her menstrual cycle one week early on 1/11 and states bleeding was much more than usual - going through a pad an hour with large clots between the size of a golf ball and baseball. Patient is on fonaparinux, and has had regular menstrual cycles on this medicine. No hx of similar events. She also noticed feeling very weak, requiring assistance with ambulating. Denies chest pain, abdominal pain, fevers, dysuria, vaginal discharge, vaginal pain. Admits to upper right back pain in the same area where she had benign tumor of the back.   Past Medical History  Diagnosis Date  . PVD (peripheral vascular disease) ([place])   . Benign tumor of back   . Swelling, lymph nodes 2006    intermittant, benign  . DVT (deep venous thrombosis) ([place])   . Stroke [facility])     left sided deficits  . Arthritis     Takyasu Arthritis   . Anxiety    Past Surgical History  Procedure Laterality Date  . Ivc filter placement      TrapEase IVC filter Safe 1.5T  . Breast surgery      bil breast implants   Family History  Problem Relation Age of Onset  . Cancer - Other Father   . Coronary artery disease Mother   . Stroke Mother   . Hypertension Mother   . Hypertension Sister    Social History  Substance Use Topics  . Smoking status: Never Smoker   . Smokeless tobacco: Never Used  . Alcohol Use: No   OB History    Gravida Para Term  Preterm AB TAB SAB Ectopic Multiple Living   1         1     Review of Systems  Constitutional: Negative.   Eyes: Negative for visual disturbance.  Respiratory: Negative for cough, shortness of breath and wheezing.   Cardiovascular: Negative.   Gastrointestinal: Negative for nausea, vomiting and abdominal pain.  Genitourinary: Positive for vaginal bleeding. Negative for dysuria, vaginal discharge and vaginal pain.  Musculoskeletal: Negative for myalgias, back pain, arthralgias and neck pain.  Skin: Negative for rash.  Neurological: Negative for dizziness, weakness and headaches.  Hematological: Bruises/bleeds easily.      Allergies  Pork-derived products  Home Medications   Prior to Admission medications   Medication Sig Start Date End Date Taking? Authorizing Provider  Ascorbic Acid (VITAMIN C) 1000 MG tablet Take 1,000 mg by mouth daily.   Yes Historical Provider, MD  aspirin EC 81 MG EC tablet Take 1 tablet (81 mg total) by mouth daily. 07/01/15  Yes Burgess Estelle, MD  CALCIUM CARBONATE-VITAMIN D PO Take 600 mg by mouth daily.    Yes Historical Provider, MD  folic acid (FOLVITE) 1 MG tablet Take 1 mg by mouth daily.   Yes Historical Provider, MD  fondaparinux (ARIXTRA) 7.5 MG/0.6ML SOLN injection Inject 7.5 mg into the skin every evening. 09/11/15  Yes Historical Provider,  MD  HYDROcodone-acetaminophen (NORCO/VICODIN) 5-325 MG tablet Take 1 tablet by mouth every 6 (six) hours as needed for moderate pain. 09/12/15  Yes Rosemarie Ax, MD  Melatonin 5 MG TABS Take 5 mg by mouth at bedtime as needed (sleep).    Yes Historical Provider, MD  methotrexate (RHEUMATREX) 2.5 MG tablet Take 20 mg by mouth once a week. Caution:Chemotherapy. Protect from light. **Saturdays**   Yes Historical Provider, MD  Multiple Vitamin (MULTIVITAMIN) tablet Take 1 tablet by mouth daily.   Yes Historical Provider, MD  predniSONE (DELTASONE) 5 MG tablet Take 7.5 mg by mouth daily. 04/18/15  Yes Historical  Provider, MD   BP 119/88 mmHg  Pulse 89  Temp(Src) 98 F (36.7 C) (Oral)  Resp 16  SpO2 100%  LMP 12/10/2015 Physical Exam  Constitutional: She is oriented to person, place, and time. She appears well-developed and well-nourished.  Alert and in no acute distress  HENT:  Head: Normocephalic and atraumatic.  Cardiovascular: Normal rate, regular rhythm, normal heart sounds and intact distal pulses.  Exam reveals no gallop and no friction rub.   No murmur heard. Pulmonary/Chest: Effort normal and breath sounds normal. No respiratory distress. She has no wheezes. She has no rales. She exhibits no tenderness.  Abdominal: She exhibits no mass. There is no rebound and no guarding.  Abdomen soft, non-tender, non-distended Bowel sounds positive in all four quadrants  Genitourinary:  Pelvic exam with active bleeding, quarter-sized clot appreciated on exam.   Musculoskeletal: She exhibits no edema.       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  Skin: Skin is warm and dry. No rash noted.  Psychiatric: She has a normal mood and affect. Her behavior is normal. Judgment and thought content normal.  Nursing note and vitals reviewed.   ED Course  Procedures (including critical care time) Labs Review Labs Reviewed  BASIC METABOLIC PANEL - Abnormal; Notable for the following:    BUN 5 (*)    All other components within normal limits  CBC WITH DIFFERENTIAL/PLATELET - Abnormal; Notable for the following:    WBC 14.4 (*)    RBC 2.87 (*)    Hemoglobin 8.2 (*)    HCT 26.4 (*)    RDW 17.6 (*)    Platelets 411 (*)    Neutro Abs 11.5 (*)    All other components within normal limits  TYPE AND SCREEN  PREPARE RBC (CROSSMATCH)    Imaging Review No results found. I have personally reviewed and evaluated these images and lab results as part of my medical decision-making.   EKG Interpretation None       MDM   Final diagnoses:  Vaginal bleeding  Symptomatic anemia   [name] presents with vaginal bleeding and generalized weakness.  Pt. Is on fondaparinux for anticoagulation- hx of takayasu arteritis with prior stroke and DVT Pt. Still actively bleeding on pelvic exam, one quarter size clot seen.   Labs: CBC shows H&H of 8.2/26.4 - normal hgb runs 12-13; BMP  Consults: OBGYN who recommends 120 megace and toradol here to slow bleeding, then 80mg  megace daily at discharge with follow up for H&H recheck. +/- 1 unit of blood in ED.  Will hold toradol due to patient being on blood thinners and give 1U blood in ED.   A&P: Symptomatic anemia, vaginal bleeding  - Care assumed by oncoming provider Dr. Jeanell Sparrow, case discussed, plan  agreed upon. Patient to receive 1U blood then discharge to home with megace as directed by GYN. Patient to follow up with her GYN, or if she cannot see them by early next week, [facility]. Return to ED for worsening bleeding or weakness.  Patient discussed with Dr. Maryan Rued who agrees with treatment plan.    [facility] Ward, PA-C 12/10/15 [place], MD 12/11/15 [phone]

## 2015-12-11 LAB — TYPE AND SCREEN
ABO/RH(D): B POS
ANTIBODY SCREEN: NEGATIVE
Unit division: 0

## 2016-01-16 ENCOUNTER — Emergency Department (HOSPITAL_COMMUNITY)
Admission: EM | Admit: 2016-01-16 | Discharge: 2016-01-16 | Disposition: A | Discharge status: Home / Self Care | Payer: 59 | Attending: Emergency Medicine | Admitting: Emergency Medicine

## 2016-01-16 ENCOUNTER — Emergency Department (INDEPENDENT_AMBULATORY_CARE_PROVIDER_SITE_OTHER)
Admission: EM | Admit: 2016-01-16 | Discharge: 2016-01-16 | Disposition: A | Discharge status: Nursing Home (Includes ICF) | Payer: Medicare Other | Source: Home / Self Care | Attending: Family Medicine | Admitting: Family Medicine

## 2016-01-16 ENCOUNTER — Other Ambulatory Visit: Payer: Self-pay

## 2016-01-16 ENCOUNTER — Encounter (HOSPITAL_COMMUNITY): Payer: Self-pay | Admitting: Emergency Medicine

## 2016-01-16 ENCOUNTER — Encounter (HOSPITAL_COMMUNITY): Payer: Self-pay

## 2016-01-16 DIAGNOSIS — R0600 Dyspnea, unspecified: Secondary | ICD-10-CM

## 2016-01-16 DIAGNOSIS — N939 Abnormal uterine and vaginal bleeding, unspecified: Secondary | ICD-10-CM

## 2016-01-16 DIAGNOSIS — I6930 Unspecified sequelae of cerebral infarction: Secondary | ICD-10-CM | POA: Diagnosis not present

## 2016-01-16 DIAGNOSIS — R531 Weakness: Secondary | ICD-10-CM | POA: Diagnosis not present

## 2016-01-16 DIAGNOSIS — N938 Other specified abnormal uterine and vaginal bleeding: Secondary | ICD-10-CM | POA: Diagnosis not present

## 2016-01-16 DIAGNOSIS — N921 Excessive and frequent menstruation with irregular cycle: Secondary | ICD-10-CM

## 2016-01-16 DIAGNOSIS — R0602 Shortness of breath: Secondary | ICD-10-CM | POA: Insufficient documentation

## 2016-01-16 DIAGNOSIS — D500 Iron deficiency anemia secondary to blood loss (chronic): Secondary | ICD-10-CM | POA: Diagnosis not present

## 2016-01-16 DIAGNOSIS — Z86718 Personal history of other venous thrombosis and embolism: Secondary | ICD-10-CM

## 2016-01-16 LAB — BASIC METABOLIC PANEL
Anion gap: 10 (ref 5–15)
CALCIUM: 8.9 mg/dL (ref 8.9–10.3)
CO2: 21 mmol/L — AB (ref 22–32)
CREATININE: 0.7 mg/dL (ref 0.44–1.00)
Chloride: 104 mmol/L (ref 101–111)
GFR calc Af Amer: >60 mL/min (ref >60)
GFR calc non Af Amer: >60 mL/min (ref >60)
GLUCOSE: 114 mg/dL — AB (ref 65–99)
Potassium: 3.6 mmol/L (ref 3.5–5.1)
Sodium: 135 mmol/L (ref 135–145)

## 2016-01-16 LAB — CBC
HCT: 29.1 % — ABNORMAL LOW (ref 36.0–46.0)
HEMOGLOBIN: 8.8 g/dL — AB (ref 12.0–15.0)
MCH: 28.6 pg (ref 26.0–34.0)
MCHC: 30.2 g/dL (ref 30.0–36.0)
MCV: 94.5 fL (ref 78.0–100.0)
Platelets: 495 10*3/uL — ABNORMAL HIGH (ref 150–400)
RBC: 3.08 MIL/uL — ABNORMAL LOW (ref 3.87–5.11)
RDW: 18.4 % — ABNORMAL HIGH (ref 11.5–15.5)
WBC: 15.8 10*3/uL — ABNORMAL HIGH (ref 4.0–10.5)

## 2016-01-16 LAB — SAMPLE TO BLOOD BANK

## 2016-01-16 LAB — I-STAT BETA HCG BLOOD, ED (MC, WL, AP ONLY): I-stat hCG, quantitative: <5 m[IU]/mL (ref <5)

## 2016-01-16 NOTE — ED Provider Notes (Signed)
CSN: [id number]     Arrival date & time 01/16/16  1505 History   First MD Initiated Contact with Patient 01/16/16 1646     Chief Complaint  Patient presents with  . Weakness  . Shortness of Breath   (Consider location/radiation/quality/duration/timing/severity/associated sxs/prior Treatment) HPI Comments: 40 year old female presents to the urgent care with a very complex and complicated medical history. Her complaints today are persistent severe weakness, shortness of breath and dizziness. She has been having vaginal bleeding on a daily basis for nearly 2 months. She has been to both the [facility] or [facility] ED  to receive packed red blood cells. She has a history of a CVA, DVT and residual left spastic hemiplegia. She currently has an IVC filter in place.  ROS includes neck pain, chest pain, abdominal pain.  Due to the seriousness of the complaints and her history of bleeding, anemia, vascular disease, CVA she will need to go to the emergency department.   Past Medical History  Diagnosis Date  . PVD (peripheral vascular disease) ([place])   . Benign tumor of back   . Swelling, lymph nodes 2006    intermittant, benign  . DVT (deep venous thrombosis) ([place])   . Stroke [facility])     left sided deficits  . Arthritis     Takyasu Arthritis   . Anxiety    Past Surgical History  Procedure Laterality Date  . Ivc filter placement      TrapEase IVC filter Safe 1.5T  . Breast surgery      bil breast implants   Family History  Problem Relation Age of Onset  . Cancer - Other Father   . Coronary artery disease Mother   . Stroke Mother   . Hypertension Mother   . Hypertension Sister    Social History  Substance Use Topics  . Smoking status: Never Smoker   . Smokeless tobacco: Never Used  . Alcohol Use: No   OB History    Gravida Para Term Preterm AB TAB SAB Ectopic Multiple Living   1         1     Review of Systems  Constitutional: Positive for activity change, appetite change and  fatigue. Negative for fever.  Respiratory: Positive for shortness of breath.   Cardiovascular: Positive for chest pain.  Gastrointestinal: Positive for abdominal pain.  Genitourinary: Positive for vaginal bleeding, menstrual problem and pelvic pain.  Neurological: Positive for dizziness and headaches.  Psychiatric/Behavioral: Positive for sleep disturbance. The patient is nervous/anxious.     Allergies  Pork-derived products  Home Medications   Prior to Admission medications   Medication Sig Start Date End Date Taking? Authorizing Provider  Ascorbic Acid (VITAMIN C) 1000 MG tablet Take 1,000 mg by mouth daily.    Historical Provider, MD  aspirin EC 81 MG EC tablet Take 1 tablet (81 mg total) by mouth daily. 07/01/15   Burgess Estelle, MD  CALCIUM CARBONATE-VITAMIN D PO Take 600 mg by mouth daily.     Historical Provider, MD  folic acid (FOLVITE) 1 MG tablet Take 1 mg by mouth daily.    Historical Provider, MD  fondaparinux (ARIXTRA) 7.5 MG/0.6ML SOLN injection Inject 7.5 mg into the skin every evening. 09/11/15   Historical Provider, MD  HYDROcodone-acetaminophen (NORCO/VICODIN) 5-325 MG tablet Take 1 tablet by mouth every 6 (six) hours as needed for moderate pain. 09/12/15   Rosemarie Ax, MD  megestrol (MEGACE) 40 MG tablet Take 2 tablets (80 mg total) by mouth  daily. 12/10/15   Ozella Almond Ward, PA-C  Melatonin 5 MG TABS Take 5 mg by mouth at bedtime as needed (sleep).     Historical Provider, MD  methotrexate (RHEUMATREX) 2.5 MG tablet Take 20 mg by mouth once a week. Caution:Chemotherapy. Protect from light. **Saturdays**    Historical Provider, MD  Multiple Vitamin (MULTIVITAMIN) tablet Take 1 tablet by mouth daily.    Historical Provider, MD  predniSONE (DELTASONE) 5 MG tablet Take 7.5 mg by mouth daily. 04/18/15   Historical Provider, MD   Meds Ordered and Administered this Visit  Medications - No data to display  BP 129/72 mmHg  Pulse 102  Temp(Src) 98.7 F (37.1 C) (Oral)   Resp 16  SpO2 100%  LMP 01/16/2016 No data found.   Physical Exam  Constitutional: She is oriented to person, place, and time. She appears distressed.  Eyes: EOM are normal.  Neck: Normal range of motion. Neck supple.  Cardiovascular: Regular rhythm and normal heart sounds.   Atrial tachycardia at 102.  Pulmonary/Chest: Effort normal and breath sounds normal. No respiratory distress. She has no wheezes.  Abdominal: There is no tenderness. There is no guarding.  Musculoskeletal: She exhibits tenderness. She exhibits no edema.  Neurological: She is alert and oriented to person, place, and time. No cranial nerve deficit. She exhibits normal muscle tone.  Skin: Skin is warm and dry.  Psychiatric: She has a normal mood and affect.  Nursing note and vitals reviewed.   ED Course  Procedures (including critical care time)  Labs Review Labs Reviewed - No data to display  Imaging Review No results found.   Visual Acuity Review  Right Eye Distance:   Left Eye Distance:   Bilateral Distance:    Right Eye Near:   Left Eye Near:    Bilateral Near:         MDM   1. Anemia due to blood loss, chronic   2. Menorrhagia with irregular cycle   3. Abnormal uterine bleeding (AUB)   4. History of CVA with residual deficit   5. History of DVT (deep vein thrombosis)   6. Weakness generalized   7. Dyspnea    Transfer to [place] via shuttle for evaluation of the above diagnoses and complaints.    Janne Napoleon, NP 01/16/16 1740

## 2016-01-16 NOTE — ED Notes (Signed)
One month history of weakness and sob.  Patient reports receiving one unit of prbc. Patient has had vaginal bleeding every day.  The ed also gave med to stop bleeding.  Then stopped for 4 days, then bleeding resumed.  Patient went to the [place] last week.  Patient reports a vaginal exam and to follow up with pcp.  [facility]  Patient is expecting progesterone (arriving in mail) but has not arrived as of yet.   Vaginal bleeding, dizzy, weakness and concerned about trying to get help and getting nowhere per patient.  Patient feels so bad and concerned she cannot wait for supplies to arrive and appt next month feeling the way she does.

## 2016-01-16 NOTE — ED Notes (Signed)
Patient left without being seen.

## 2016-01-16 NOTE — ED Notes (Signed)
Patient is pale, speaking in complete sentences comfortably.  Patient expression is flat.  Patient looks tired.

## 2016-01-16 NOTE — ED Notes (Signed)
Patient here for recurrent shortness of breathing, weakness and ongoing vaginal bleeding; just finished menstrual cycle. Patient pale on arrival.

## 2016-01-17 ENCOUNTER — Emergency Department (HOSPITAL_COMMUNITY): Payer: 59

## 2016-01-17 ENCOUNTER — Emergency Department (HOSPITAL_COMMUNITY)
Admission: EM | Admit: 2016-01-17 | Discharge: 2016-01-17 | Discharge status: Against Medical Advice | Payer: 59 | Attending: Emergency Medicine | Admitting: Emergency Medicine

## 2016-01-17 ENCOUNTER — Encounter (HOSPITAL_COMMUNITY): Payer: Self-pay | Admitting: *Deleted

## 2016-01-17 DIAGNOSIS — D649 Anemia, unspecified: Secondary | ICD-10-CM | POA: Diagnosis not present

## 2016-01-17 DIAGNOSIS — F419 Anxiety disorder, unspecified: Secondary | ICD-10-CM | POA: Insufficient documentation

## 2016-01-17 DIAGNOSIS — R0600 Dyspnea, unspecified: Secondary | ICD-10-CM | POA: Diagnosis not present

## 2016-01-17 DIAGNOSIS — M1990 Unspecified osteoarthritis, unspecified site: Secondary | ICD-10-CM | POA: Insufficient documentation

## 2016-01-17 DIAGNOSIS — Z79899 Other long term (current) drug therapy: Secondary | ICD-10-CM | POA: Insufficient documentation

## 2016-01-17 DIAGNOSIS — Z7982 Long term (current) use of aspirin: Secondary | ICD-10-CM | POA: Insufficient documentation

## 2016-01-17 DIAGNOSIS — Z8673 Personal history of transient ischemic attack (TIA), and cerebral infarction without residual deficits: Secondary | ICD-10-CM | POA: Insufficient documentation

## 2016-01-17 DIAGNOSIS — Z86718 Personal history of other venous thrombosis and embolism: Secondary | ICD-10-CM | POA: Insufficient documentation

## 2016-01-17 DIAGNOSIS — R0602 Shortness of breath: Secondary | ICD-10-CM | POA: Diagnosis present

## 2016-01-17 LAB — PREPARE RBC (CROSSMATCH)

## 2016-01-17 MED ORDER — SODIUM CHLORIDE 0.9 % IV SOLN
10.0000 mL/h | Freq: Once | INTRAVENOUS | Status: AC
  Administered 2016-01-17: 10 mL/h via INTRAVENOUS

## 2016-01-17 MED ORDER — FONDAPARINUX SODIUM 7.5 MG/0.6ML ~~LOC~~ SOLN
7.5000 mg | Freq: Every evening | SUBCUTANEOUS | Status: DC
  Administered 2016-01-17: 7.5 mg via SUBCUTANEOUS
  Filled 2016-01-17: qty 0.6

## 2016-01-17 NOTE — ED Notes (Signed)
Delay explained to patient 

## 2016-01-17 NOTE — ED Notes (Signed)
MD occupied in other room, unable to do IV start with u/s. IV team ordered and paged.

## 2016-01-17 NOTE — ED Provider Notes (Signed)
11:20 PM Assumed care from Dr. Jeanell Sparrow, please see their note for full history, physical and decision making until this point. In brief this is a 40 y.o. year old female who presented to the ED tonight with Shortness of Breath     40 year old female with history of pulmonary emboli and anemia presents to the emergency department today with dyspnea worse with exertion. Chest x-ray unremarkable initial labs unremarkable. Patient is planning to get a blood transfusion and negative CT scan to evaluate for embolus falls okay she can be discharged.  After getting her blood transfusion patient says she did not want a CT scan and she felt that it was anemia causing her problems. I discussed with her the reason for the CT scan to evaluate for blood clot which could be life-threatening however patient refused the CT scan thinking that she had waited too long or ready. I discussed that this could be life-threatening problem if we missed it however she was of her right mind, not intoxicated and had the capacity to make decisions and was allowed to sign out emergency department [place]. I advised her that she can return here at any time if she changed her mind about treatment.  Discharge instructions, including strict return precautions for new or worsening symptoms, given. Patient and/or family verbalized understanding and agreement with the plan as described.   Labs, studies and imaging reviewed by myself and considered in medical decision making if ordered. Imaging interpreted by radiology.  Labs Reviewed  PREPARE RBC (CROSSMATCH)  TYPE AND SCREEN    DG Chest 2 View  Final Result      No Follow-up on file.   Merrily Pew, MD 01/17/16 [phone]

## 2016-01-17 NOTE — ED Notes (Signed)
Two unsuccessful IV start attempts. 

## 2016-01-17 NOTE — ED Notes (Signed)
This RN attempted IV access without success x 2.

## 2016-01-17 NOTE — ED Notes (Signed)
IV team at bedside attempting IV access. Pt refusing to wear BP cuff and pulse ox.

## 2016-01-17 NOTE — ED Provider Notes (Signed)
CSN: [id number]     Arrival date & time 01/17/16  [id number] History   First MD Initiated Contact with Patient 01/17/16 1415     Chief Complaint  Patient presents with  . Shortness of Breath     (Consider location/radiation/quality/duration/timing/severity/associated sxs/prior Treatment) HPI  This is a 40 year old lady with a history of DVT, PE, Takyasu arthritis, stroke, and symptomatic anemia who presents today complaining of dyspnea. She states that when she was here last month her hemoglobin was low and she was transfused 1 unit of blood. She's continued to have vaginal bleeding. She was initially given Megace and her bleeding stopped for 4 days she is on the Megace. She then began having more vaginal bleeding. She is followed at the [facility]. She attempted to be seen there. They were supposed to be starting her on progesterone but she has not received any.She does have a history of pulmonary embolism. And she will need to be evaluated for a new PCP. She denies any fever or cough. She feels that this is due to her low hemoglobin and crit has been gradual in onset.  Past Medical History  Diagnosis Date  . PVD (peripheral vascular disease) ([place])   . Benign tumor of back   . Swelling, lymph nodes 2006    intermittant, benign  . DVT (deep venous thrombosis) ([place])   . Stroke [facility])     left sided deficits  . Arthritis     Takyasu Arthritis   . Anxiety    Past Surgical History  Procedure Laterality Date  . Ivc filter placement      TrapEase IVC filter Safe 1.5T  . Breast surgery      bil breast implants   Family History  Problem Relation Age of Onset  . Cancer - Other Father   . Coronary artery disease Mother   . Stroke Mother   . Hypertension Mother   . Hypertension Sister    Social History  Substance Use Topics  . Smoking status: Never Smoker   . Smokeless tobacco: Never Used  . Alcohol Use: No   OB History    Gravida Para Term Preterm AB TAB SAB Ectopic Multiple Living   1          1     Review of Systems  All other systems reviewed and are negative.     Allergies  Pork-derived products  Home Medications   Prior to Admission medications   Medication Sig Start Date End Date Taking? Authorizing Provider  Ascorbic Acid (VITAMIN C) 1000 MG tablet Take 1,000 mg by mouth daily.    Historical Provider, MD  aspirin EC 81 MG EC tablet Take 1 tablet (81 mg total) by mouth daily. 07/01/15   Burgess Estelle, MD  CALCIUM CARBONATE-VITAMIN D PO Take 600 mg by mouth daily.     Historical Provider, MD  folic acid (FOLVITE) 1 MG tablet Take 1 mg by mouth daily.    Historical Provider, MD  fondaparinux (ARIXTRA) 7.5 MG/0.6ML SOLN injection Inject 7.5 mg into the skin every evening. 09/11/15   Historical Provider, MD  HYDROcodone-acetaminophen (NORCO/VICODIN) 5-325 MG tablet Take 1 tablet by mouth every 6 (six) hours as needed for moderate pain. 09/12/15   Rosemarie Ax, MD  megestrol (MEGACE) 40 MG tablet Take 2 tablets (80 mg total) by mouth daily. 12/10/15   Jaime Pilcher Ward, PA-C  Melatonin 5 MG TABS Take 5 mg by mouth at bedtime as needed (sleep).  Historical Provider, MD  methotrexate (RHEUMATREX) 2.5 MG tablet Take 20 mg by mouth once a week. Caution:Chemotherapy. Protect from light. **Saturdays**    Historical Provider, MD  Multiple Vitamin (MULTIVITAMIN) tablet Take 1 tablet by mouth daily.    Historical Provider, MD  predniSONE (DELTASONE) 5 MG tablet Take 7.5 mg by mouth daily. 04/18/15   Historical Provider, MD   BP 131/69 mmHg  Pulse 90  Temp(Src) 97.9 F (36.6 C) (Oral)  Resp 18  SpO2 99%  LMP 01/16/2016 Physical Exam  Constitutional: She appears well-developed and well-nourished.  HENT:  Head: Normocephalic and atraumatic.  Right Ear: External ear normal.  Left Ear: External ear normal.  Nose: Nose normal.  Mouth/Throat: Oropharynx is clear and moist.  Eyes: Pupils are equal, round, and reactive to light.  Neck: Normal range of motion.   Cardiovascular: Normal rate.   Pulmonary/Chest: Effort normal and breath sounds normal.  Abdominal: Soft.  Musculoskeletal:  Left upper extremity contracture  Neurological: She is alert.  Skin: Skin is warm.  Psychiatric: She has a normal mood and affect.  Nursing note and vitals reviewed.   ED Course  Procedures (including critical care time) Labs Review Labs Reviewed  PREPARE RBC (CROSSMATCH)  TYPE AND SCREEN    Imaging Review Dg Chest 2 View  01/17/2016  CLINICAL DATA:  Dyspnea. Weakness and fatigue. Shortness of breath. Left chest pain. EXAM: CHEST  2 VIEW COMPARISON:  04/20/2015 FINDINGS: Low lung volumes are present, causing crowding of the pulmonary vasculature. The patient was unable to raise her arms on the lateral projection. The left humeral head projects below the glenoid. Suspected mild subsegmental atelectasis in the left lower lobe. Bilateral breast implants cause hazy thin in density at the lung bases. IMPRESSION: 1. Suspected mild atelectasis or scarring in the left lower lobe. Much of the hazy density on the frontal projection is due to the breast implant. 2. Low lung volumes are present, causing crowding of the pulmonary vasculature. 3. The left humeral head seems inferiorly located with respect to the glenoid. Correlate with left shoulder functioning in assessing for dislocation. A similar appearance has been present in the past, query joint laxity. Electronically Signed   By: Van Clines M.D.   On: 01/17/2016 15:33   I have personally reviewed and evaluated these images and lab results as part of my medical decision-making.   EKG Interpretation   Date/Time:  Tuesday January 17 2016 09:09:08 EST Ventricular Rate:  96 PR Interval:  132 QRS Duration: 76 QT Interval:  326 QTC Calculation: 411 R Axis:   68 Text Interpretation:  Normal sinus rhythm Abnormal ECG Confirmed by Danaly Bari  MD, Andee Poles [id number]) on 01/17/2016 2:17:08 PM      MDM    Patient is  anemic with a hemoglobin of 8.8. I have discussed risks and benefits of transfusion advised that I do not think she should be transfused. She is insistent upon her need for transfusion. I have ordered 1 unit of packed red blood cells. CT is pending to assess for PE.  Discussed with Dr. Dolly Rias and he will follow-up on CT scan to assess for pe and infiltrate    Pattricia Boss, MD 01/17/16 [phone]

## 2016-01-17 NOTE — Progress Notes (Signed)
Pt is very hard stick, RN is working hard with MD to get a US guided IV that we can use

## 2016-01-17 NOTE — ED Notes (Signed)
Notified Dr. Rivka Barbara of multiple unsuccessful IV sticks. Resident to attempt IV insertion with u/s.

## 2016-01-17 NOTE — Discharge Instructions (Signed)
Anemia, Nonspecific Anemia is a condition in which the concentration of red blood cells or hemoglobin in the blood is below normal. Hemoglobin is a substance in red blood cells that carries oxygen to the tissues of the body. Anemia results in not enough oxygen reaching these tissues.  CAUSES  Common causes of anemia include:   Excessive bleeding. Bleeding may be internal or external. This includes excessive bleeding from periods (in women) or from the intestine.   Poor nutrition.   Chronic kidney, thyroid, and liver disease.  Bone marrow disorders that decrease red blood cell production.  Cancer and treatments for cancer.  HIV, AIDS, and their treatments.  Spleen problems that increase red blood cell destruction.  Blood disorders.  Excess destruction of red blood cells due to infection, medicines, and autoimmune disorders. SIGNS AND SYMPTOMS   Minor weakness.   Dizziness.   Headache.  Palpitations.   Shortness of breath, especially with exercise.   Paleness.  Cold sensitivity.  Indigestion.  Nausea.  Difficulty sleeping.  Difficulty concentrating. Symptoms may occur suddenly or they may develop slowly.  DIAGNOSIS  Additional blood tests are often needed. These help your health care provider determine the best treatment. Your health care provider will check your stool for blood and look for other causes of blood loss.  TREATMENT  Treatment varies depending on the cause of the anemia. Treatment can include:   Supplements of iron, vitamin B12, or folic acid.   Hormone medicines.   A blood transfusion. This may be needed if blood loss is severe.   Hospitalization. This may be needed if there is significant continual blood loss.   Dietary changes.  Spleen removal. HOME CARE INSTRUCTIONS Keep all follow-up appointments. It often takes many weeks to correct anemia, and having your health care provider check on your condition and your response to  treatment is very important. SEEK IMMEDIATE MEDICAL CARE IF:   You develop extreme weakness, shortness of breath, or chest pain.   You become dizzy or have trouble concentrating.  You develop heavy vaginal bleeding.   You develop a rash.   You have bloody or black, tarry stools.   You faint.   You vomit up blood.   You vomit repeatedly.   You have abdominal pain.  You have a fever or persistent symptoms for more than 2-3 days.   You have a fever and your symptoms suddenly get worse.   You are dehydrated.  MAKE SURE YOU:  Understand these instructions.  Will watch your condition.  Will get help right away if you are not doing well or get worse.   This information is not intended to replace advice given to you by your health care provider. Make sure you discuss any questions you have with your health care provider.   Document Released: 12/20/2004 Document Revised: 07/15/2013 Document Reviewed: 05/08/2013 Elsevier Interactive Patient Education 2016 Elsevier Inc.  

## 2016-01-17 NOTE — Progress Notes (Signed)
Notified RN of need for another IV for the scan that was ordered.  A 22 gauge needle cannot be used at all for a CTA.

## 2016-01-17 NOTE — ED Notes (Signed)
Pt states that she was here last night for same symptoms. Pt reports SOB that has been ongoing for weeks. Pt states that it is related to her anemia. Pt states that she had to receive blood 1 month ago ans states that she doesn't feel that she was given enough.

## 2016-01-18 LAB — TYPE AND SCREEN
ABO/RH(D): B POS
Antibody Screen: NEGATIVE
Unit division: 0

## 2016-01-24 ENCOUNTER — Encounter: Payer: Self-pay | Admitting: Physical Therapy

## 2016-01-24 NOTE — Therapy (Signed)
[facility] [address] [place], [place], [address] Phone: [phone]   Fax:  [phone]  Patient Details  Name: [name] MRN: [id number] Date of Birth: [birth year] Referring Provider:  Scot Jun, MD Encounter Date: 01/24/2016   PHYSICAL THERAPY DISCHARGE SUMMARY  Visits from Start of Care: 2  Current functional level related to goals / functional outcomes: Patient did not return for scheduled PT appointments so unknown status.    Remaining deficits: Patient did not return for scheduled PT appointments so unknown status   Education / Equipment: Initiated HEP Plan:                                                    Patient goals were not met. Patient is being discharged due to not returning since the last visit.  ?????       Maryori Weide PT, DPT 01/24/2016, 10:55 AM  [facility] [address] [place], [place], [address] Phone: [phone]   Fax:  [phone]

## 2016-04-15 ENCOUNTER — Emergency Department (HOSPITAL_COMMUNITY)
Admission: EM | Admit: 2016-04-15 | Discharge: 2016-04-16 | Disposition: A | Discharge status: Home / Self Care | Payer: 59 | Attending: Emergency Medicine | Admitting: Emergency Medicine

## 2016-04-15 ENCOUNTER — Emergency Department (HOSPITAL_COMMUNITY): Payer: 59

## 2016-04-15 ENCOUNTER — Encounter (HOSPITAL_COMMUNITY): Payer: Self-pay | Admitting: Emergency Medicine

## 2016-04-15 DIAGNOSIS — Z86018 Personal history of other benign neoplasm: Secondary | ICD-10-CM | POA: Insufficient documentation

## 2016-04-15 DIAGNOSIS — N938 Other specified abnormal uterine and vaginal bleeding: Secondary | ICD-10-CM | POA: Insufficient documentation

## 2016-04-15 DIAGNOSIS — Z86718 Personal history of other venous thrombosis and embolism: Secondary | ICD-10-CM | POA: Diagnosis not present

## 2016-04-15 DIAGNOSIS — Z7982 Long term (current) use of aspirin: Secondary | ICD-10-CM | POA: Insufficient documentation

## 2016-04-15 DIAGNOSIS — F419 Anxiety disorder, unspecified: Secondary | ICD-10-CM | POA: Diagnosis not present

## 2016-04-15 DIAGNOSIS — Z3202 Encounter for pregnancy test, result negative: Secondary | ICD-10-CM | POA: Diagnosis not present

## 2016-04-15 DIAGNOSIS — Z8673 Personal history of transient ischemic attack (TIA), and cerebral infarction without residual deficits: Secondary | ICD-10-CM | POA: Insufficient documentation

## 2016-04-15 DIAGNOSIS — Z7952 Long term (current) use of systemic steroids: Secondary | ICD-10-CM | POA: Insufficient documentation

## 2016-04-15 DIAGNOSIS — D649 Anemia, unspecified: Secondary | ICD-10-CM | POA: Insufficient documentation

## 2016-04-15 DIAGNOSIS — R531 Weakness: Secondary | ICD-10-CM | POA: Diagnosis present

## 2016-04-15 DIAGNOSIS — Z79899 Other long term (current) drug therapy: Secondary | ICD-10-CM | POA: Diagnosis not present

## 2016-04-15 DIAGNOSIS — M1990 Unspecified osteoarthritis, unspecified site: Secondary | ICD-10-CM | POA: Diagnosis not present

## 2016-04-15 DIAGNOSIS — R0602 Shortness of breath: Secondary | ICD-10-CM | POA: Insufficient documentation

## 2016-04-15 LAB — CBC WITH DIFFERENTIAL/PLATELET
Basophils Absolute: 0 10*3/uL (ref 0.0–0.1)
Basophils Relative: 0 %
Eosinophils Absolute: 0 10*3/uL (ref 0.0–0.7)
Eosinophils Relative: 0 %
HEMATOCRIT: 26.8 % — AB (ref 36.0–46.0)
Hemoglobin: 8.2 g/dL — ABNORMAL LOW (ref 12.0–15.0)
LYMPHS ABS: 2.5 10*3/uL (ref 0.7–4.0)
LYMPHS PCT: 19 %
MCH: 27.9 pg (ref 26.0–34.0)
MCHC: 30.6 g/dL (ref 30.0–36.0)
MCV: 91.2 fL (ref 78.0–100.0)
MONO ABS: 0.7 10*3/uL (ref 0.1–1.0)
MONOS PCT: 6 %
NEUTROS ABS: 9.7 10*3/uL — AB (ref 1.7–7.7)
Neutrophils Relative %: 75 %
Platelets: 456 10*3/uL — ABNORMAL HIGH (ref 150–400)
RBC: 2.94 MIL/uL — ABNORMAL LOW (ref 3.87–5.11)
RDW: 15.1 % (ref 11.5–15.5)
WBC: 13 10*3/uL — ABNORMAL HIGH (ref 4.0–10.5)

## 2016-04-15 LAB — BASIC METABOLIC PANEL
Anion gap: 6 (ref 5–15)
BUN: 7 mg/dL (ref 6–20)
CALCIUM: 9.2 mg/dL (ref 8.9–10.3)
CO2: 27 mmol/L (ref 22–32)
CREATININE: 0.94 mg/dL (ref 0.44–1.00)
Chloride: 108 mmol/L (ref 101–111)
GFR calc Af Amer: >60 mL/min (ref >60)
GFR calc non Af Amer: >60 mL/min (ref >60)
GLUCOSE: 111 mg/dL — AB (ref 65–99)
Potassium: 3.6 mmol/L (ref 3.5–5.1)
Sodium: 141 mmol/L (ref 135–145)

## 2016-04-15 LAB — I-STAT BETA HCG BLOOD, ED (MC, WL, AP ONLY): I-stat hCG, quantitative: <5 m[IU]/mL (ref <5)

## 2016-04-15 MED ORDER — SODIUM CHLORIDE 0.9 % IV BOLUS (SEPSIS)
1000.0000 mL | Freq: Once | INTRAVENOUS | Status: AC
  Administered 2016-04-15: 1000 mL via INTRAVENOUS

## 2016-04-15 MED ORDER — LORAZEPAM 2 MG/ML IJ SOLN
1.0000 mg | Freq: Once | INTRAMUSCULAR | Status: AC
  Administered 2016-04-15: 1 mg via INTRAVENOUS
  Filled 2016-04-15: qty 1

## 2016-04-15 NOTE — ED Provider Notes (Signed)
CSN: [id number]     Arrival date & time 04/15/16  2139 History   First MD Initiated Contact with Patient 04/15/16 2223     Chief Complaint  Patient presents with  . Shortness of Breath     (Consider location/radiation/quality/duration/timing/severity/associated sxs/prior Treatment) HPI   Pt is a 40 year old female with a history of PVD, stroke with left-sided hemiparesis, DVT with IVC filter, Takyasu arthritis who presents the ED with shortness of breath and weakness. Patient states she's been having heavy menstrual cycles and started her menstrual cycle 5 days ago. She states she changes her pad every 45 minutes. She has had issues with anemia from menstrual bleeding in the past last time in February where she was transfused. She has increased weakness since the start of her menstrual cycle. Tonight her husband witnessed a syncopal episode around 9 PM. She states she was out for roughly 2 minutes. She had another episode again tonight at 10:30 in the ER lobby lasted less than 1 minute. He states she is not aware that she is about to pass out. He denies abdominal pain, numbness/tingling, new onset weakness, headache, chest pain, nausea, vomiting. Pt is on Narafelin acetate for her heavy menstrual bleeding.  Past Medical History  Diagnosis Date  . PVD (peripheral vascular disease) ([place])   . Benign tumor of back   . Swelling, lymph nodes 2006    intermittant, benign  . DVT (deep venous thrombosis) ([place])   . Stroke [facility])     left sided deficits  . Arthritis     Takyasu Arthritis   . Anxiety    Past Surgical History  Procedure Laterality Date  . Ivc filter placement      TrapEase IVC filter Safe 1.5T  . Breast surgery      bil breast implants   Family History  Problem Relation Age of Onset  . Cancer - Other Father   . Coronary artery disease Mother   . Stroke Mother   . Hypertension Mother   . Hypertension Sister    Social History  Substance Use Topics  . Smoking status: Never  Smoker   . Smokeless tobacco: Never Used  . Alcohol Use: No   OB History    Gravida Para Term Preterm AB TAB SAB Ectopic Multiple Living   1         1     Review of Systems  Constitutional: Negative for fever, chills and diaphoresis.  HENT: Negative for trouble swallowing.   Eyes: Negative for visual disturbance.  Respiratory: Positive for shortness of breath. Negative for cough and chest tightness.   Cardiovascular: Negative for chest pain and leg swelling.  Gastrointestinal: Negative for nausea, vomiting, abdominal pain and diarrhea.  Genitourinary: Positive for vaginal bleeding.  Musculoskeletal: Negative for back pain and neck pain.  Skin: Negative for rash.  Neurological: Positive for syncope, weakness and light-headedness. Negative for numbness and headaches.      Allergies  Pork-derived products  Home Medications   Prior to Admission medications   Medication Sig Start Date End Date Taking? Authorizing Provider  Ascorbic Acid (VITAMIN C) 1000 MG tablet Take 1,000 mg by mouth daily.   Yes Historical Provider, MD  aspirin EC 81 MG EC tablet Take 1 tablet (81 mg total) by mouth daily. 07/01/15  Yes Burgess Estelle, MD  CALCIUM CARBONATE-VITAMIN D PO Take 600 mg by mouth daily.    Yes Historical Provider, MD  folic acid (FOLVITE) 1 MG tablet Take 1 mg by mouth  daily.   Yes Historical Provider, MD  fondaparinux (ARIXTRA) 7.5 MG/0.6ML SOLN injection Inject 7.5 mg into the skin every evening. 09/11/15  Yes Historical Provider, MD  HYDROcodone-acetaminophen (NORCO/VICODIN) 5-325 MG tablet Take 1 tablet by mouth every 6 (six) hours as needed for moderate pain. 09/12/15  Yes Rosemarie Ax, MD  Melatonin 5 MG TABS Take 5 mg by mouth at bedtime.    Yes Historical Provider, MD  Multiple Vitamin (MULTIVITAMIN) tablet Take 1 tablet by mouth daily.   Yes Historical Provider, MD  predniSONE (DELTASONE) 5 MG tablet Take 7.5 mg by mouth daily. 04/18/15  Yes Historical Provider, MD  megestrol  (MEGACE) 40 MG tablet Take 2 tablets (80 mg total) by mouth daily. Patient not taking: Reported on 01/17/2016 12/10/15   [facility] Ward, PA-C   BP 135/71 mmHg  Pulse 88  Temp(Src) 98 F (36.7 C) (Oral)  Resp 16  SpO2 100%  LMP 04/11/2016 Physical Exam  Constitutional: She appears well-developed and well-nourished. No distress.  HENT:  Head: Normocephalic and atraumatic.  Mouth/Throat: Uvula is midline and oropharynx is clear and moist. Mucous membranes are pale and not dry.  Eyes: Conjunctivae are normal. Pupils are equal, round, and reactive to light.  Cardiovascular: Normal rate, regular rhythm and normal heart sounds.  Exam reveals no gallop and no friction rub.   No murmur heard. Pulmonary/Chest: Effort normal and breath sounds normal. No respiratory distress. She has no wheezes. She has no rales.  Abdominal: Soft. Bowel sounds are normal. She exhibits no distension. There is no tenderness. There is no rebound.  Genitourinary:  Exam performed by Kalman Drape,  exam chaperoned Date: 04/16/2016 Pelvic exam: normal external genitalia without evidence of trauma, large blood clots noted to maxi pad. VULVA: normal appearing vulva with no masses, tenderness or lesion. VAGINA: normal appearing vagina with normal color and discharge, no lesions. CERVIX: normal appearing cervix without lesions, cervical os closed with out purulent discharge; vaginal discharge - copious and bloody,    Musculoskeletal:  Hemiparesis noted to left upper and lower extremity, normal ROM and strength 5/5 of right upper and lower extremity  Neurological: She is alert. No cranial nerve deficit or sensory deficit. Coordination normal.  Skin: Skin is warm and dry. No rash noted.  Pallor noted to nail beds and palms  Psychiatric: She has a normal mood and affect. Her behavior is normal.    ED Course  Procedures (including critical care time)  CRITICAL CARE Performed by: Kalman Drape   Total critical  care time: 50 minutes  Critical care time was exclusive of separately billable procedures and treating other patients.  Critical care was necessary to treat or prevent imminent or life-threatening deterioration.  Critical care was time spent personally by me on the following activities: development of treatment plan with patient and/or surrogate as well as nursing, discussions with consultants, evaluation of patient's response to treatment, examination of patient, obtaining history from patient or surrogate, ordering and performing treatments and interventions, ordering and review of laboratory studies, ordering and review of radiographic studies, pulse oximetry and re-evaluation of patient's condition.  Labs Review Labs Reviewed  CBC WITH DIFFERENTIAL/PLATELET - Abnormal; Notable for the following:    WBC 13.0 (*)    RBC 2.94 (*)    Hemoglobin 8.2 (*)    HCT 26.8 (*)    Platelets 456 (*)    Neutro Abs 9.7 (*)    All other components within normal limits  BASIC METABOLIC PANEL -  Abnormal; Notable for the following:    Glucose, Bld 111 (*)    All other components within normal limits  I-STAT BETA HCG BLOOD, ED ([facility], [facility], [facility] ONLY)  TYPE AND SCREEN  PREPARE RBC (CROSSMATCH)    Imaging Review Dg Chest 2 View  04/16/2016  CLINICAL DATA:  Shortness of breath. EXAM: CHEST  2 VIEW COMPARISON:  Most recent comparison exam radiographs 01/17/2016 FINDINGS: The cardiomediastinal contours are unchanged, heart normal in size. Surgical clips at the thoracic inlet. Pulmonary vasculature is normal. No consolidation, pleural effusion, or pneumothorax. No acute osseous abnormalities are seen. IMPRESSION: No acute pulmonary process. Electronically Signed   By: Jeb Levering M.D.   On: 04/16/2016 01:14   I have personally reviewed and evaluated these images and lab results as part of my medical decision-making.   EKG Interpretation   Date/Time:  Sunday Apr 15 2016 21:45:26 EDT Ventricular Rate:   108 PR Interval:  124 QRS Duration: 72 QT Interval:  330 QTC Calculation: 442 R Axis:   72 Text Interpretation:  Sinus tachycardia Septal infarct , age undetermined  Abnormal ECG tachycardia new since previous  Confirmed by YAO  MD, DAVID  ([id number]) on 04/15/2016 11:02:43 PM      MDM   Final diagnoses:  None    Pt is a 40 year old female with a history of PVD, stroke with left-sided hemiparesis, DVT with IVC filter, Takyasu arthritis who presents the ED with shortness of breath and weakness likely 2/2 heavy menstrual bleeding and chronic use of anti-coagulant. Pelvic exam revealed heavy bleeding with multiple large clots. Patient was found to be anemic although close to her baseline but symptomatic. 2 syncopal episodes today with associated shortness of breath and weakness. EKG revealed sinus tachycardia but no other acute abnormalities and chest x-ray revealed no acute pulmonary process so less likely cardiac etiology.   Will give patient one unit of PRBC and fluids.  Dr. Dina Rich consulted OB/GYN for further input on the management of the patient's bleeding.  Patient's care was signed out to Dr. Dina Rich at shift change who will assume care of patient.   Kalman Drape, PA 04/16/16 [id number]  Merryl Hacker, MD 04/16/16 [phone]

## 2016-04-15 NOTE — ED Notes (Signed)
Pt sitting in w/c in the waiting area. Pt had syncopal episode while waiting, on assessment, pt is diaphoretic and pale. Pt brought back to treatment room and placed on bed. RN to bedside. Dr. Darl Householder alerted to the same.

## 2016-04-15 NOTE — ED Notes (Signed)
X-ray came to pick up pt. Pt refusing X-ray until an MD or PA informs pt as to why she needs a X-ray

## 2016-04-15 NOTE — ED Notes (Signed)
Pt. reports SOB onset this afternoon with occasional dry cough , pt. added heavy menstrual cycle this week . Denies fever or chills.

## 2016-04-16 LAB — I-STAT CHEM 8, ED
BUN: 5 mg/dL — ABNORMAL LOW (ref 6–20)
CREATININE: 0.8 mg/dL (ref 0.44–1.00)
Calcium, Ion: 0.99 mmol/L — ABNORMAL LOW (ref 1.12–1.23)
Chloride: 111 mmol/L (ref 101–111)
GLUCOSE: 85 mg/dL (ref 65–99)
HCT: 28 % — ABNORMAL LOW (ref 36.0–46.0)
HEMOGLOBIN: 9.5 g/dL — AB (ref 12.0–15.0)
Potassium: 3.6 mmol/L (ref 3.5–5.1)
Sodium: 144 mmol/L (ref 135–145)
TCO2: 16 mmol/L (ref 0–100)

## 2016-04-16 LAB — PREPARE RBC (CROSSMATCH)

## 2016-04-16 MED ORDER — MEGESTROL ACETATE 40 MG PO TABS
40.0000 mg | ORAL_TABLET | Freq: Every day | ORAL | Status: DC
  Administered 2016-04-16: 40 mg via ORAL
  Filled 2016-04-16: qty 1

## 2016-04-16 MED ORDER — MEGESTROL ACETATE 40 MG PO TABS
ORAL_TABLET | ORAL | Status: DC
Start: 2016-04-16 — End: 2017-04-28

## 2016-04-16 MED ORDER — HYDROCODONE-ACETAMINOPHEN 5-325 MG PO TABS
1.0000 | ORAL_TABLET | Freq: Once | ORAL | Status: AC
  Administered 2016-04-16: 1 via ORAL
  Filled 2016-04-16: qty 1

## 2016-04-16 MED ORDER — SODIUM CHLORIDE 0.9 % IV SOLN
10.0000 mL/h | Freq: Once | INTRAVENOUS | Status: AC
  Administered 2016-04-16: 10 mL/h via INTRAVENOUS

## 2016-04-16 NOTE — Discharge Instructions (Signed)
You were seen today for abnormal uterine bleeding and symptomatic anemia. We will attempt to control your bleeding with progesterone. Take as directed. If bleeding worsens or you go through more than 1 pad per hour, you need to be reevaluated. Follow-up at [facility] hospital. Also follow-up with your primary physician.  Abnormal Uterine Bleeding Abnormal uterine bleeding means bleeding from the vagina that is not your normal menstrual period. This can be:  Bleeding or spotting between periods.  Bleeding after sex (sexual intercourse).  Bleeding that is heavier or more than normal.  Periods that last longer than usual.  Bleeding after menopause. There are many problems that may cause this. Treatment will depend on the cause of the bleeding. Any kind of bleeding that is not normal should be reviewed by your doctor.  HOME CARE Watch your condition for any changes. These actions may lessen any discomfort you are having:  Do not use tampons or douches as told by your doctor.  Change your pads often. You should get regular pelvic exams and Pap tests. Keep all appointments for tests as told by your doctor. GET HELP IF:  You are bleeding for more than 1 week.  You feel dizzy at times. GET HELP RIGHT AWAY IF:   You pass out.  You have to change pads every 15 to 30 minutes.  You have belly pain.  You have a fever.  You become sweaty or weak.  You are passing large blood clots from the vagina.  You feel sick to your stomach (nauseous) and throw up (vomit). MAKE SURE YOU:  Understand these instructions.  Will watch your condition.  Will get help right away if you are not doing well or get worse.   This information is not intended to replace advice given to you by your health care provider. Make sure you discuss any questions you have with your health care provider.   Document Released: 09/09/2009 Document Revised: 11/17/2013 Document Reviewed: 06/11/2013 Elsevier Interactive  Patient Education 2016 [employer].  Anemia, Nonspecific Anemia is a condition in which the concentration of red blood cells or hemoglobin in the blood is below normal. Hemoglobin is a substance in red blood cells that carries oxygen to the tissues of the body. Anemia results in not enough oxygen reaching these tissues.  CAUSES  Common causes of anemia include:   Excessive bleeding. Bleeding may be internal or external. This includes excessive bleeding from periods (in women) or from the intestine.   Poor nutrition.   Chronic kidney, thyroid, and liver disease.  Bone marrow disorders that decrease red blood cell production.  Cancer and treatments for cancer.  HIV, AIDS, and their treatments.  Spleen problems that increase red blood cell destruction.  Blood disorders.  Excess destruction of red blood cells due to infection, medicines, and autoimmune disorders. SIGNS AND SYMPTOMS   Minor weakness.   Dizziness.   Headache.  Palpitations.   Shortness of breath, especially with exercise.   Paleness.  Cold sensitivity.  Indigestion.  Nausea.  Difficulty sleeping.  Difficulty concentrating. Symptoms may occur suddenly or they may develop slowly.  DIAGNOSIS  Additional blood tests are often needed. These help your health care provider determine the best treatment. Your health care provider will check your stool for blood and look for other causes of blood loss.  TREATMENT  Treatment varies depending on the cause of the anemia. Treatment can include:   Supplements of iron, vitamin 123456, or folic acid.   Hormone medicines.   A  blood transfusion. This may be needed if blood loss is severe.   Hospitalization. This may be needed if there is significant continual blood loss.   Dietary changes.  Spleen removal. HOME CARE INSTRUCTIONS Keep all follow-up appointments. It often takes many weeks to correct anemia, and having your health care provider check on  your condition and your response to treatment is very important. SEEK IMMEDIATE MEDICAL CARE IF:   You develop extreme weakness, shortness of breath, or chest pain.   You become dizzy or have trouble concentrating.  You develop heavy vaginal bleeding.   You develop a rash.   You have bloody or black, tarry stools.   You faint.   You vomit up blood.   You vomit repeatedly.   You have abdominal pain.  You have a fever or persistent symptoms for more than 2-3 days.   You have a fever and your symptoms suddenly get worse.   You are dehydrated.  MAKE SURE YOU:  Understand these instructions.  Will watch your condition.  Will get help right away if you are not doing well or get worse.   This information is not intended to replace advice given to you by your health care provider. Make sure you discuss any questions you have with your health care provider.   Document Released: 12/20/2004 Document Revised: 07/15/2013 Document Reviewed: 05/08/2013 Elsevier Interactive Patient Education [employer].

## 2016-04-16 NOTE — ED Notes (Signed)
This RN entered pt room for d/c. Pt states that she needs to use bedpan before leaving, pt is usually ambulatory w/ walker at home. RN offered to wheel pt to restroom or to bring bedside commode. Pt states "isn't it your job to put me on the bedpan?" This RN stated that staff will not place pt on bedpan. Pt assisted to bedside commode by this RN, changed depends, assisted to dress in scrubs. Pt asking for several chucks pads to take home. RN gave pt 2 chucks pads and told pt that they are also available at store.   Pt verbalized understanding of d/c instructions, prescriptions, and follow-up care. No further questions/concerns, VSS, assisted pt to lobby in wheelchair.

## 2016-04-17 LAB — TYPE AND SCREEN
ABO/RH(D): B POS
ANTIBODY SCREEN: NEGATIVE
Unit division: 0

## 2016-04-26 ENCOUNTER — Encounter (HOSPITAL_COMMUNITY): Payer: Self-pay | Admitting: Emergency Medicine

## 2016-04-26 ENCOUNTER — Emergency Department (HOSPITAL_COMMUNITY)
Admission: EM | Admit: 2016-04-26 | Discharge: 2016-04-27 | Disposition: A | Discharge status: Home / Self Care | Payer: 59 | Attending: Emergency Medicine | Admitting: Emergency Medicine

## 2016-04-26 DIAGNOSIS — M1990 Unspecified osteoarthritis, unspecified site: Secondary | ICD-10-CM | POA: Insufficient documentation

## 2016-04-26 DIAGNOSIS — F419 Anxiety disorder, unspecified: Secondary | ICD-10-CM | POA: Diagnosis not present

## 2016-04-26 DIAGNOSIS — Z86718 Personal history of other venous thrombosis and embolism: Secondary | ICD-10-CM | POA: Insufficient documentation

## 2016-04-26 DIAGNOSIS — Z7982 Long term (current) use of aspirin: Secondary | ICD-10-CM | POA: Diagnosis not present

## 2016-04-26 DIAGNOSIS — Z86011 Personal history of benign neoplasm of the brain: Secondary | ICD-10-CM | POA: Insufficient documentation

## 2016-04-26 DIAGNOSIS — I159 Secondary hypertension, unspecified: Secondary | ICD-10-CM | POA: Insufficient documentation

## 2016-04-26 DIAGNOSIS — Z8673 Personal history of transient ischemic attack (TIA), and cerebral infarction without residual deficits: Secondary | ICD-10-CM | POA: Diagnosis not present

## 2016-04-26 DIAGNOSIS — Z7952 Long term (current) use of systemic steroids: Secondary | ICD-10-CM | POA: Diagnosis not present

## 2016-04-26 DIAGNOSIS — Z79899 Other long term (current) drug therapy: Secondary | ICD-10-CM | POA: Insufficient documentation

## 2016-04-26 DIAGNOSIS — R030 Elevated blood-pressure reading, without diagnosis of hypertension: Secondary | ICD-10-CM | POA: Diagnosis present

## 2016-04-26 LAB — CBC
HEMATOCRIT: 38.2 % (ref 36.0–46.0)
HEMOGLOBIN: 12.1 g/dL (ref 12.0–15.0)
MCH: 28.3 pg (ref 26.0–34.0)
MCHC: 31.7 g/dL (ref 30.0–36.0)
MCV: 89.5 fL (ref 78.0–100.0)
Platelets: 529 10*3/uL — ABNORMAL HIGH (ref 150–400)
RBC: 4.27 MIL/uL (ref 3.87–5.11)
RDW: 17.9 % — ABNORMAL HIGH (ref 11.5–15.5)
WBC: 14.3 10*3/uL — ABNORMAL HIGH (ref 4.0–10.5)

## 2016-04-26 LAB — PROTIME-INR
INR: 1.21 (ref 0.00–1.49)
Prothrombin Time: 15.4 seconds — ABNORMAL HIGH (ref 11.6–15.2)

## 2016-04-26 LAB — ETHANOL: Alcohol, Ethyl (B): <5 mg/dL (ref <5)

## 2016-04-26 LAB — I-STAT CHEM 8, ED
BUN: 3 mg/dL — ABNORMAL LOW (ref 6–20)
CALCIUM ION: 1.23 mmol/L (ref 1.12–1.23)
CREATININE: 0.8 mg/dL (ref 0.44–1.00)
Chloride: 106 mmol/L (ref 101–111)
GLUCOSE: 88 mg/dL (ref 65–99)
HCT: 40 % (ref 36.0–46.0)
HEMOGLOBIN: 13.6 g/dL (ref 12.0–15.0)
POTASSIUM: 3.6 mmol/L (ref 3.5–5.1)
Sodium: 142 mmol/L (ref 135–145)
TCO2: 25 mmol/L (ref 0–100)

## 2016-04-26 LAB — COMPREHENSIVE METABOLIC PANEL
ALT: 18 U/L (ref 14–54)
AST: 18 U/L (ref 15–41)
Albumin: 3.5 g/dL (ref 3.5–5.0)
Alkaline Phosphatase: 61 U/L (ref 38–126)
Anion gap: 9 (ref 5–15)
BILIRUBIN TOTAL: 0.4 mg/dL (ref 0.3–1.2)
CALCIUM: 9.7 mg/dL (ref 8.9–10.3)
CO2: 24 mmol/L (ref 22–32)
CREATININE: 0.86 mg/dL (ref 0.44–1.00)
Chloride: 108 mmol/L (ref 101–111)
GFR calc Af Amer: >60 mL/min (ref >60)
Glucose, Bld: 89 mg/dL (ref 65–99)
Potassium: 3.6 mmol/L (ref 3.5–5.1)
Sodium: 141 mmol/L (ref 135–145)
TOTAL PROTEIN: 7.2 g/dL (ref 6.5–8.1)

## 2016-04-26 LAB — DIFFERENTIAL
BASOS ABS: 0 10*3/uL (ref 0.0–0.1)
Basophils Relative: 0 %
EOS ABS: 0 10*3/uL (ref 0.0–0.7)
Eosinophils Relative: 0 %
LYMPHS ABS: 1.4 10*3/uL (ref 0.7–4.0)
Lymphocytes Relative: 10 %
MONOS PCT: 4 %
Monocytes Absolute: 0.6 10*3/uL (ref 0.1–1.0)
NEUTROS ABS: 12.2 10*3/uL — AB (ref 1.7–7.7)
Neutrophils Relative %: 86 %

## 2016-04-26 LAB — I-STAT TROPONIN, ED: Troponin i, poc: 0 ng/mL (ref 0.00–0.08)

## 2016-04-26 LAB — APTT: APTT: 30 s (ref 24–37)

## 2016-04-26 MED ORDER — LORAZEPAM 1 MG PO TABS: 1.0000 mg | ORAL_TABLET | Freq: Three times a day (TID) | ORAL | Status: DC | PRN

## 2016-04-26 MED ORDER — HYDROCHLOROTHIAZIDE 12.5 MG PO TABS: 12.5000 mg | ORAL_TABLET | Freq: Every day | ORAL | Status: DC

## 2016-04-26 MED ORDER — HYDROCHLOROTHIAZIDE 12.5 MG PO CAPS
12.5000 mg | ORAL_CAPSULE | Freq: Every day | ORAL | Status: DC
  Administered 2016-04-27: 12.5 mg via ORAL
  Filled 2016-04-26: qty 1

## 2016-04-26 NOTE — Discharge Instructions (Signed)
How to Take Your Blood Pressure HOW DO I GET A BLOOD PRESSURE MACHINE?  You can buy an electronic home blood pressure machine at your local pharmacy. Insurance will sometimes cover the cost if you have a prescription.  Ask your doctor what type of machine is best for you. There are different machines for your arm and your wrist.  If you decide to buy a machine to check your blood pressure on your arm, first check the size of your arm so you can buy the right size cuff. To check the size of your arm:   Use a measuring tape that shows both inches and centimeters.   Wrap the measuring tape around the upper-middle part of your arm. You may need someone to help you measure.   Write down your arm measurement in both inches and centimeters.   To measure your blood pressure correctly, it is important to have the right size cuff.   If your arm is up to 13 inches (up to 34 centimeters), get an adult cuff size.  If your arm is 13 to 17 inches (35 to 44 centimeters), get a large adult cuff size.    If your arm is 17 to 20 inches (45 to 52 centimeters), get an adult thigh cuff.  WHAT DO THE NUMBERS MEAN?   There are two numbers that make up your blood pressure. For example: 120/80.  The first number (120 in our example) is called the "systolic pressure." It is a measure of the pressure in your blood vessels when your heart is pumping blood.  The second number (80 in our example) is called the "diastolic pressure." It is a measure of the pressure in your blood vessels when your heart is resting between beats.  Your doctor will tell you what your blood pressure should be. WHAT SHOULD I DO BEFORE I CHECK MY BLOOD PRESSURE?   Try to rest or relax for at least 30 minutes before you check your blood pressure.  Do not smoke.  Do not have any drinks with caffeine, such as:  Soda.  Coffee.  Tea.  Check your blood pressure in a quiet room.  Sit down and stretch out your arm on a  table. Keep your arm at about the level of your heart. Let your arm relax.  Make sure that your legs are not crossed. HOW DO I CHECK MY BLOOD PRESSURE?  Follow the directions that came with your machine.  Make sure you remove any tight-fitting clothing from your arm or wrist. Wrap the cuff around your upper arm or wrist. You should be able to fit a finger between the cuff and your arm. If you cannot fit a finger between the cuff and your arm, it is too tight and should be removed and rewrapped.  Some units require you to manually pump up the arm cuff.  Automatic units inflate the cuff when you press a button.  Cuff deflation is automatic in both models.  After the cuff is inflated, the unit measures your blood pressure and pulse. The readings are shown on a monitor. Hold still and breathe normally while the cuff is inflated.  Getting a reading takes less than a minute.  Some models store readings in a memory. Some provide a printout of readings. If your machine does not store your readings, keep a written record.  Take readings with you to your next visit with your doctor.   This information is not intended to replace advice given to  you by your health care provider. Make sure you discuss any questions you have with your health care provider.   Document Released: 10/25/2008 Document Revised: 12/03/2014 Document Reviewed: 01/07/2014 Elsevier Interactive Patient Education 2016 [place] Anxiety Disorder Social anxiety disorder, previously called social phobia, is a mental disorder. People with social anxiety disorder frequently feel nervous, afraid, or embarrassed when around other people in social situations. They constantly worry that other people are judging or criticizing them for how they look, what they say, or how they act. They may worry that other people might reject them because of their appearance or behavior. Social anxiety disorder is more than just occasional  shyness or self-consciousness. It can cause severe emotional distress. It can interfere with daily life activities. Social anxiety disorder also may lead to excessive alcohol or drug use and even suicide.  Social anxiety disorder is actually one of the most common mental disorders. It can develop at any time but usually starts in the teenage years. Women are more commonly affected than men. Social anxiety disorder is also more common in people who have family members with anxiety disorders. It also is more common in people who have physical deformities or conditions with characteristics that are obvious to others, such as stuttered speech or movement abnormalities (Parkinson disease).  SYMPTOMS  In addition to feeling anxious or fearful in social situations, people with social anxiety disorder frequently have physical symptoms. Examples include:  Red face (blushing).  Racing heart.  Sweating.  Shaky hands or voice.  Confusion.  Light-headedness.  Upset stomach and diarrhea. DIAGNOSIS  Social anxiety disorder is diagnosed through an assessment by your health care provider. Your health care provider will ask you questions about your mood, thoughts, and reactions in social situations. Your health care provider may ask you about your medical history and use of alcohol or drugs, including prescription medicines. Certain medical conditions and the use of certain substances, including caffeine, can cause symptoms similar to social anxiety disorder. Your health care provider may refer you to a mental health specialist for further evaluation or treatment. The criteria for diagnosis of social anxiety disorder are:  Marked fear or anxiety in one or more social situations in which you may be closely watched or studied by others. Examples of such situations include:  Interacting socially (having a conversation with others, going to a party, or meeting strangers).  Being observed (eating or drinking in  public or being called on in class).  Performing in front of others (giving a speech).  The social situations of concern almost always cause fear or anxiety, not just occasionally.  People with social anxiety disorder fear that they will be viewed negatively in a way that will be embarrassing, will lead to rejection, or will offend others. This fear is out of proportion to the actual threat posed by the social situation.  Often the triggering social situations are avoided, or they are endured with intense fear or anxiety. The fear, anxiety, or avoidance is persistent and lasts for 6 months or longer.  The anxiety causes difficulty functioning in at least some parts of your daily life. TREATMENT  Several types of treatment are available for social anxiety disorder. These treatments are often used in combination and include:   Talk therapy. Group talk therapy allows you to see that you are not alone with these problems. Individual talk therapy helps you address your specific anxiety issues with a caring professional. The most effective forms of talk therapy  for social anxiety disorder are cognitive-behavioral therapy and exposure therapy. Cognitive-behavioral therapy helps you to identify and change negative thoughts and beliefs that are at the root of the disorder. Exposure therapy allows you to gradually face the situations that you fear most.  Relaxation and coping techniques. These include deep breathing, self-talk, meditation, visual imagery, and yoga. Relaxation techniques help to keep you calm in social situations.  Social Company secretary.Social skills can be learned on your own or with the help of a talk therapist. They can help you feel more confident and comfortable in social situations.  Medicine. For anxiety limited to performance situations (performance anxiety), medicine called beta blockers can help by reducing or preventing the physical symptoms of social anxiety disorder. For more  persistent and generalized social anxiety, antidepressant medicine may be prescribed to help control symptoms. In severe cases of social anxiety disorder, strong antianxiety medicine, called benzodiazepines, may be prescribed on a limited basis and for a short time.   This information is not intended to replace advice given to you by your health care provider. Make sure you discuss any questions you have with your health care provider.   Document Released: 10/11/2005 Document Revised: 12/03/2014 Document Reviewed: 02/10/2013 Elsevier Interactive Patient Education [employer].

## 2016-04-26 NOTE — ED Provider Notes (Signed)
CSN: [id number]     Arrival date & time 04/26/16  1859 History   First MD Initiated Contact with Patient 04/26/16 2311     Chief Complaint  Patient presents with  . Hypertension     (Consider location/radiation/quality/duration/timing/severity/associated sxs/prior Treatment) HPI  This is a 40 year old lady with a history of DVT, PE, Takyasu arthritis, stroke, and symptomatic anemia who presents today complaining of hypertension. The patient was at [name] and took her BP. She noticed it to be 170/100, she called her primary care doctor who, due to her hx of stroke, instructed her to comes to the ER for evaluation and treatment. She is unable to move her left arm at baseline. She denied having any other associated symptoms and took her BP because it was high when she left the ER this morning. Patient was seen on 5/22 for low hemoglobin due to vaginal bleeding and was given blood transfusion. She didn't feel like it was enough blood and went to the [name] last night had had them give her 2 units of PRBCs and 2 liters of fluids. She has noted high BP since then, she reports telling her nurses and the physician at the [name] that her BP was elevated but they did not seem to be concernd that thought it would improve on its own. She also endorses having a lot of anxiety which is baseline for her.   On arrival her BP is noted to be 173/93. On initial examination by myself her BP has improve to 138/74.  [name] is a 40 y.o. female  PCP:   Negative ROS: Confusion, diaphoresis, fever, headache, weakness (general or focal), change of vision,  neck pain, dysphagia, aphagia, chest pain, shortness of breath,  back pain, abdominal pains, nausea, vomiting, diarrhea, lower extremity swelling, rash.   Past Medical History  Diagnosis Date  . PVD (peripheral vascular disease) ([place])   . Benign tumor of back   . Swelling, lymph nodes 2006    intermittant, benign  . DVT (deep venous thrombosis) ([place])   . Stroke  [name])     left sided deficits  . Arthritis     Takyasu Arthritis   . Anxiety    Past Surgical History  Procedure Laterality Date  . Ivc filter placement      TrapEase IVC filter Safe 1.5T  . Breast surgery      bil breast implants   Family History  Problem Relation Age of Onset  . Cancer - Other Father   . Coronary artery disease Mother   . Stroke Mother   . Hypertension Mother   . Hypertension Sister    Social History  Substance Use Topics  . Smoking status: Never Smoker   . Smokeless tobacco: Never Used  . Alcohol Use: No   OB History    Gravida Para Term Preterm AB TAB SAB Ectopic Multiple Living   1         1     Review of Systems  Review of Systems All other systems negative except as documented in the HPI. All pertinent positives and negatives as reviewed in the HPI.   Allergies  Pork-derived products  Home Medications   Prior to Admission medications   Medication Sig Start Date End Date Taking? Authorizing Provider  Ascorbic Acid (VITAMIN C) 1000 MG tablet Take 1,000 mg by mouth daily.    Historical Provider, MD  aspirin EC 81 MG EC tablet Take 1 tablet (81 mg total) by mouth daily.  07/01/15   Burgess Estelle, MD  CALCIUM CARBONATE-VITAMIN D PO Take 600 mg by mouth daily.     Historical Provider, MD  folic acid (FOLVITE) 1 MG tablet Take 1 mg by mouth daily.    Historical Provider, MD  fondaparinux (ARIXTRA) 7.5 MG/0.6ML SOLN injection Inject 7.5 mg into the skin every evening. 09/11/15   Historical Provider, MD  hydrochlorothiazide (HYDRODIURIL) 12.5 MG tablet Take 1 tablet (12.5 mg total) by mouth daily. 04/26/16   Delos Haring, PA-C  HYDROcodone-acetaminophen (NORCO/VICODIN) 5-325 MG tablet Take 1 tablet by mouth every 6 (six) hours as needed for moderate pain. 09/12/15   Rosemarie Ax, MD  LORazepam (ATIVAN) 1 MG tablet Take 1 tablet (1 mg total) by mouth 3 (three) times daily as needed for anxiety. 04/26/16   Vianny Schraeder Carlota Raspberry, PA-C  megestrol (MEGACE) 40 MG  tablet Take 40 mg by mouth 2 times daily 5 days, continue 40 mg daily 5 days or until bleeding stops 04/16/16   Merryl Hacker, MD  Melatonin 5 MG TABS Take 5 mg by mouth at bedtime.     Historical Provider, MD  Multiple Vitamin (MULTIVITAMIN) tablet Take 1 tablet by mouth daily.    Historical Provider, MD  predniSONE (DELTASONE) 5 MG tablet Take 7.5 mg by mouth daily. 04/18/15   Historical Provider, MD   BP 169/88 mmHg  Pulse 74  Temp(Src) 99.1 F (37.3 C) (Oral)  Resp 18  SpO2 100%  LMP 04/11/2016 Physical Exam  Constitutional: She appears well-developed and well-nourished. No distress.  HENT:  Head: Normocephalic and atraumatic.  Right Ear: Tympanic membrane and ear canal normal.  Left Ear: Tympanic membrane and ear canal normal.  Nose: Nose normal.  Mouth/Throat: Uvula is midline, oropharynx is clear and moist and mucous membranes are normal.  Eyes: Pupils are equal, round, and reactive to light.  Neck: Normal range of motion. Neck supple.  Cardiovascular: Normal rate and regular rhythm.   Pulmonary/Chest: Effort normal.  Abdominal: Soft.  No signs of abdominal distention  Musculoskeletal:  No LE swelling  Neurological: She is alert.  Acting at baseline Unable to move left arm at baseline  Skin: Skin is warm and dry. No rash noted.  Nursing note and vitals reviewed.   ED Course  Procedures (including critical care time) Labs Review Labs Reviewed  PROTIME-INR - Abnormal; Notable for the following:    Prothrombin Time 15.4 (*)    All other components within normal limits  CBC - Abnormal; Notable for the following:    WBC 14.3 (*)    RDW 17.9 (*)    Platelets 529 (*)    All other components within normal limits  DIFFERENTIAL - Abnormal; Notable for the following:    Neutro Abs 12.2 (*)    All other components within normal limits  COMPREHENSIVE METABOLIC PANEL - Abnormal; Notable for the following:    BUN <5 (*)    All other components within normal limits   I-STAT CHEM 8, ED - Abnormal; Notable for the following:    BUN 3 (*)    All other components within normal limits  ETHANOL  APTT  I-STAT TROPOININ, ED    Imaging Review No results found. I have personally reviewed and evaluated these images and lab results as part of my medical decision-making.   EKG Interpretation None      MDM   Final diagnoses:  Anxiety  Secondary hypertension, unspecified    Patient did not have any other associated symptoms. She used  to be on a BP medication one year ago which she thinks was either HCTZ or lisonpril. She she's her PCP on Monday and is strongly requesting to be started on BP medications. Discussed that this is not typically done from the Emergency Department but pt says she will likely check back in if she see's a high BP.   Will give her a small dose of BP medication here in the ED, as I rechecked her BP in the room and it is 170/100. Will give her 3 BP tabs for one a day until she see;'s her PCP on Monday. She is to check her BP before taking the medication or she is not to take it at all. She is only to take the medication if her BP is greater than 160/90. Discussed in length that if she takes BP medication and her blood pressure is normal  that it can be very dangerous and drop her BP to an unsafe level. She voices her understanding. She also requests an Rx for Ativan.  Patient noted to be hypertensive in the emergency department.  No signs of hypertensive urgency.  Discussed with patient the need for close follow-up and management by their primary care physician.     Delos Haring, PA-C 04/27/16 0000  Ripley Fraise, MD 04/27/16 [phone]

## 2016-04-26 NOTE — ED Notes (Signed)
Pt went to [facility] and took BP, it was 170/100, pt told to come here by her PCP. Pt has hx of stroke, unable to move her left arm. Pt denies any pain/headaches/dizziness/lightheadedness at this time. Pt in NAD. Pt does not take any medication for BP. Pt is AAOX4. Pt had stroke in 2014.

## 2016-04-27 NOTE — ED Notes (Signed)
Pt stable, ambulatory, states understanding of discharge instructions 

## 2016-09-11 IMAGING — CT CT HEAD W/O CM
2 series · 15 of 30 positions shown, 19 images · non-contrast
Comparison: CT of the brain 04/20/2015

CLINICAL DATA: Code stroke. History of 3 strokes in 2629. Blindness
in the left side. Headaches and dizziness.

EXAM:
CT HEAD WITHOUT CONTRAST
TECHNIQUE: Contiguous axial images were obtained from the base of the skull
through the vertex without intravenous contrast.

[Series 201: head w/o, idose (1) · axial · non-contrast · 0.49mm/px · z∈[+99,+224]mm · 13 of 31 slices shown, 17 images]
[im 3/31  brain]
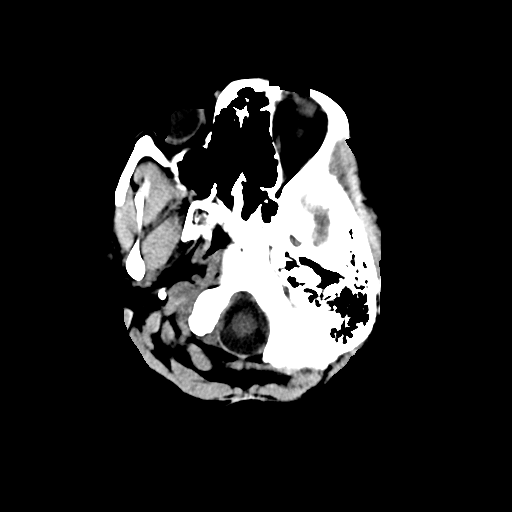
[im 3/31  bone]
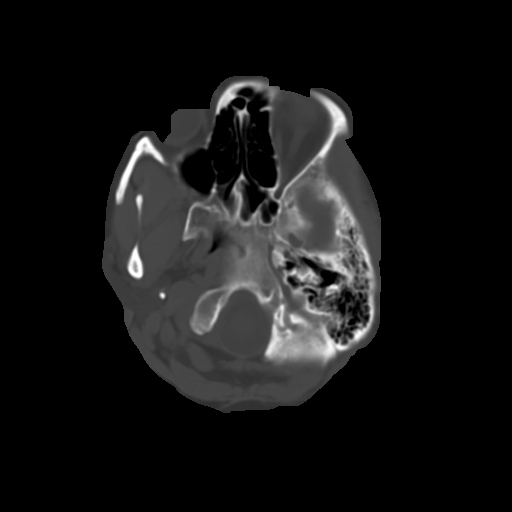
[im 5/31  brain]
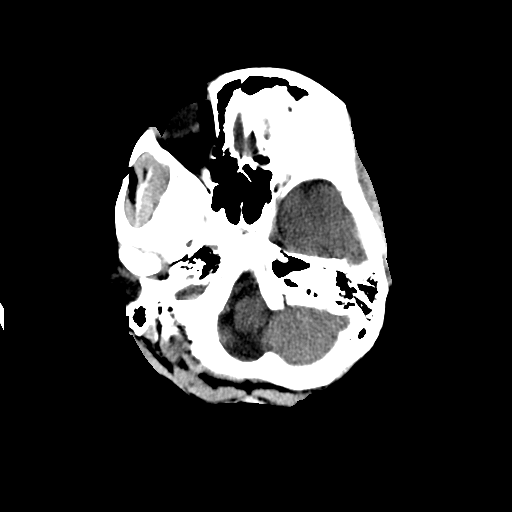
[im 7/31  brain]
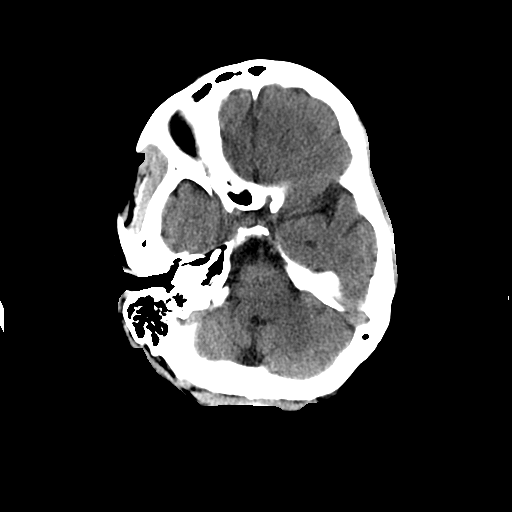
[im 9/31  brain]
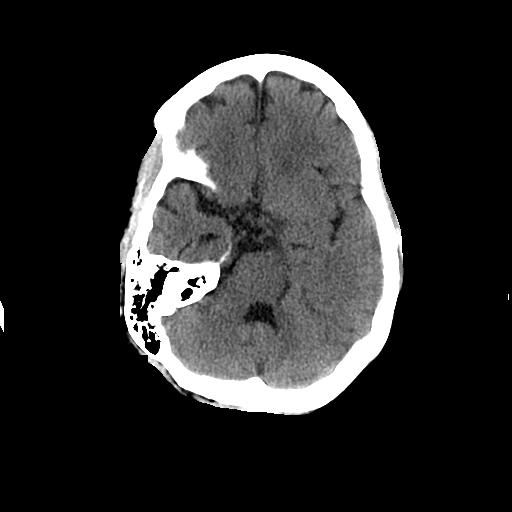
[im 11/31  brain]
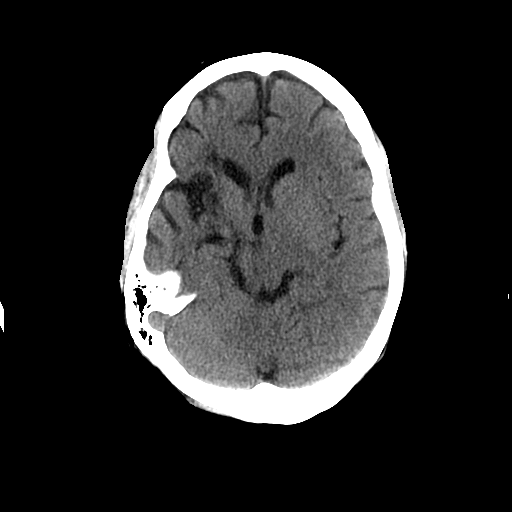
[im 11/31  bone]
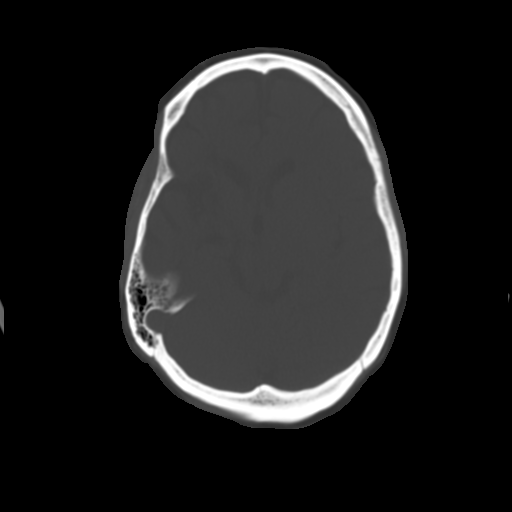
[im 13/31  brain]
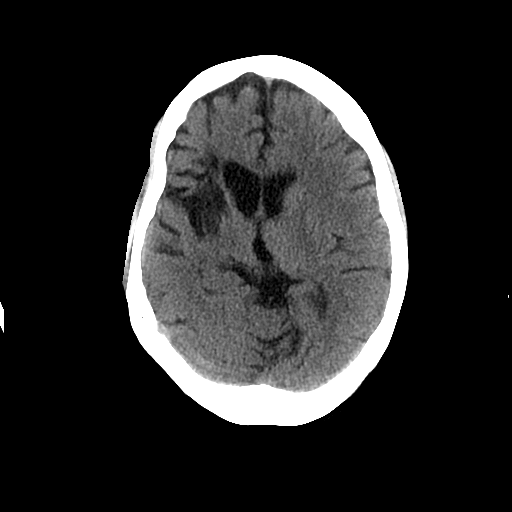
[im 16/31  brain]
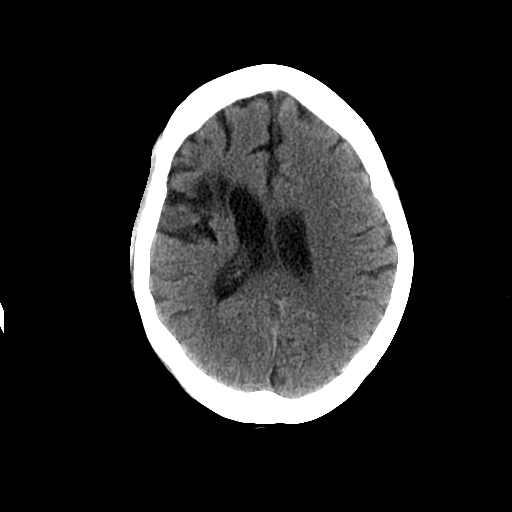
[im 18/31  brain]
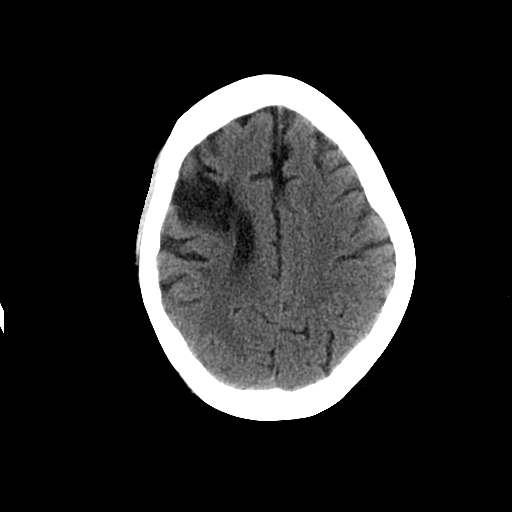
[im 20/31  brain]
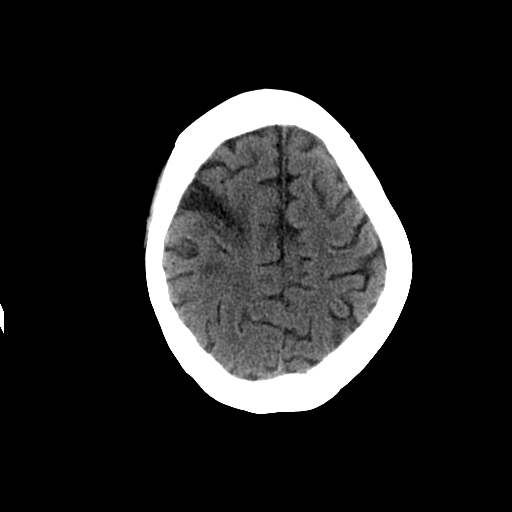
[im 20/31  bone]
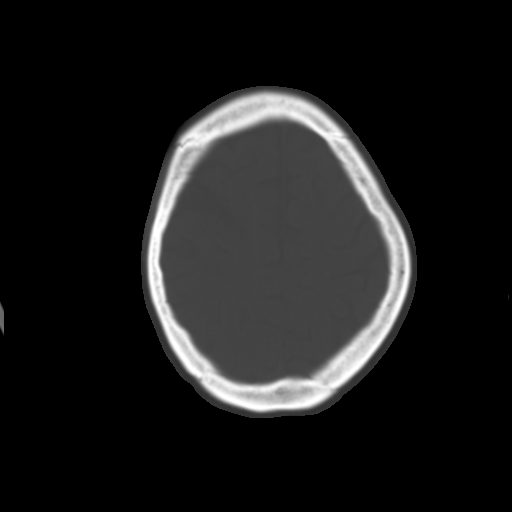
[im 22/31  brain]
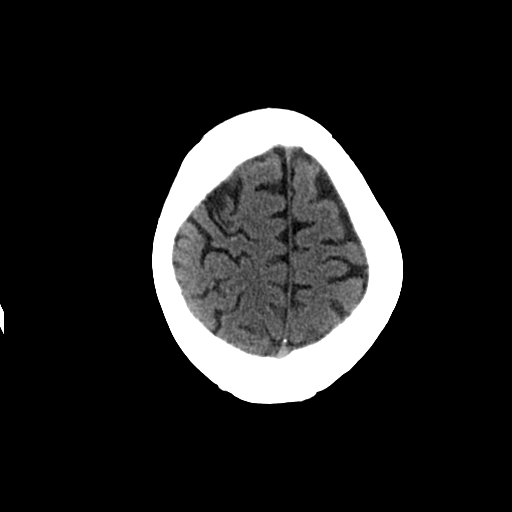
[im 24/31  brain]
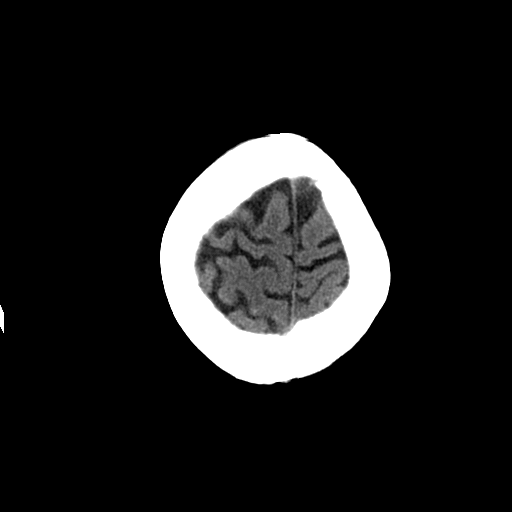
[im 26/31  brain]
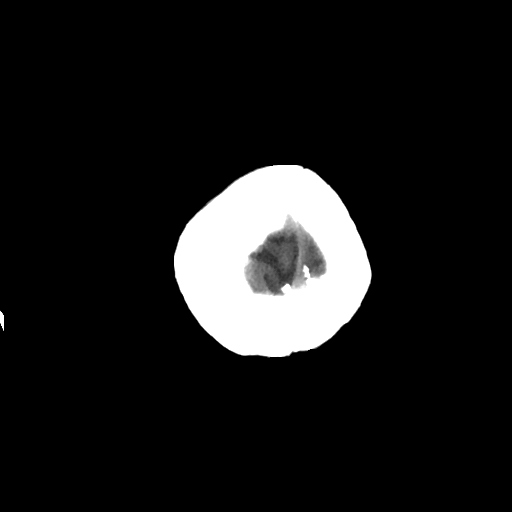
[im 28/31  brain]
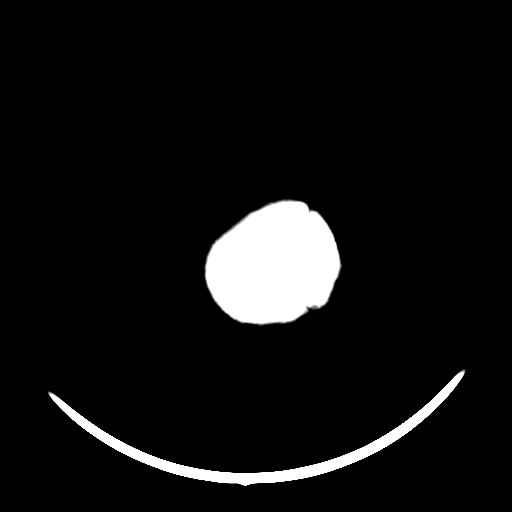
[im 28/31  bone]
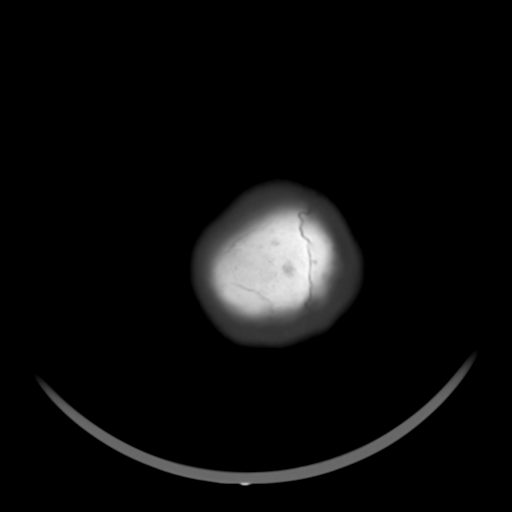

[Series 202: head w/o bone, idose (1) · axial · non-contrast · 0.49mm/px · z∈[+99,+119]mm · 2 of 31 slices shown]
[im 3/31  bone]
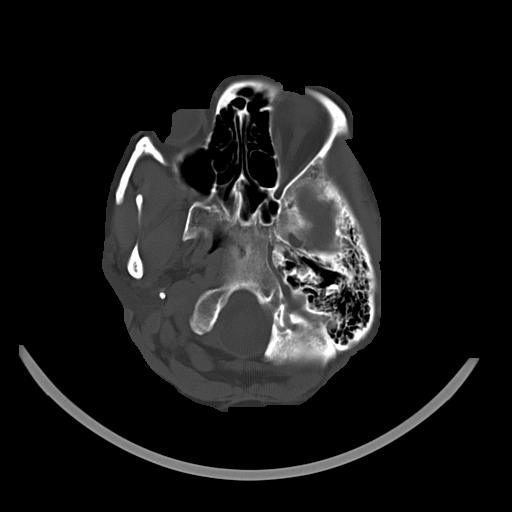
[im 7/31  bone]
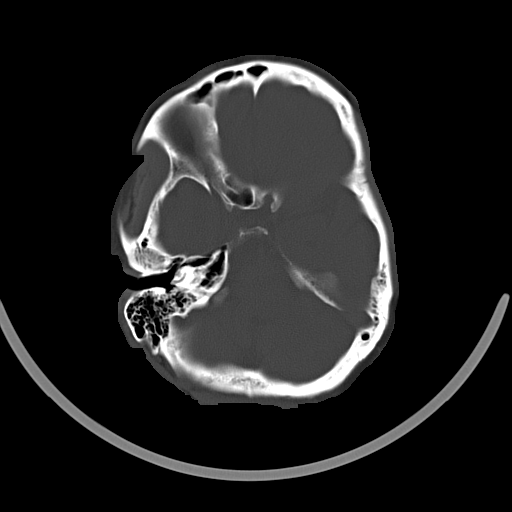

[15 of 30 positions shown; findings below may reference images not displayed]

FINDINGS: There is encephalomalacia involving the posterior frontal lobe,
insula, and basal ganglia. There is asymmetry of the lateral
ventricles secondary to hydrocephalus ex vacuo, all stable. There is
no intra or extra-axial fluid collection or mass lesion. The basilar
cisterns and ventricles have a normal appearance. There is no CT
evidence for acute infarction or hemorrhage. Bone windows show
normally aerated paranasal and mastoid air cells. No acute fracture.
IMPRESSION: 1. Changes consistent with previous right MCA distribution infarct.
2.  No evidence for acute intracranial abnormality.
3. Critical Value/emergent results were called by telephone at the
time of interpretation on 06/30/2015 at [DATE] to Dr. Blain Jumper,
who verbally acknowledged these results.

## 2017-02-10 ENCOUNTER — Encounter (HOSPITAL_COMMUNITY): Payer: Self-pay | Admitting: Emergency Medicine

## 2017-02-10 ENCOUNTER — Emergency Department (HOSPITAL_COMMUNITY): Payer: 59

## 2017-02-10 ENCOUNTER — Emergency Department (HOSPITAL_COMMUNITY)
Admission: EM | Admit: 2017-02-10 | Discharge: 2017-02-10 | Disposition: A | Discharge status: Home / Self Care | Payer: 59 | Attending: Emergency Medicine | Admitting: Emergency Medicine

## 2017-02-10 DIAGNOSIS — Z8673 Personal history of transient ischemic attack (TIA), and cerebral infarction without residual deficits: Secondary | ICD-10-CM | POA: Insufficient documentation

## 2017-02-10 DIAGNOSIS — M314 Aortic arch syndrome [Takayasu]: Secondary | ICD-10-CM

## 2017-02-10 DIAGNOSIS — R071 Chest pain on breathing: Secondary | ICD-10-CM | POA: Diagnosis present

## 2017-02-10 DIAGNOSIS — Z7982 Long term (current) use of aspirin: Secondary | ICD-10-CM | POA: Insufficient documentation

## 2017-02-10 DIAGNOSIS — R079 Chest pain, unspecified: Secondary | ICD-10-CM | POA: Diagnosis not present

## 2017-02-10 DIAGNOSIS — R51 Headache: Secondary | ICD-10-CM | POA: Insufficient documentation

## 2017-02-10 DIAGNOSIS — Z86018 Personal history of other benign neoplasm: Secondary | ICD-10-CM | POA: Diagnosis not present

## 2017-02-10 DIAGNOSIS — R519 Headache, unspecified: Secondary | ICD-10-CM

## 2017-02-10 HISTORY — DX: Cerebral infarction, unspecified: I63.9

## 2017-02-10 LAB — BASIC METABOLIC PANEL
Anion gap: 11 (ref 5–15)
BUN: 7 mg/dL (ref 6–20)
CO2: 24 mmol/L (ref 22–32)
Calcium: 9.3 mg/dL (ref 8.9–10.3)
Chloride: 104 mmol/L (ref 101–111)
Creatinine, Ser: 0.89 mg/dL (ref 0.44–1.00)
Glucose, Bld: 114 mg/dL — ABNORMAL HIGH (ref 65–99)
POTASSIUM: 3.4 mmol/L — AB (ref 3.5–5.1)
SODIUM: 139 mmol/L (ref 135–145)

## 2017-02-10 LAB — I-STAT BETA HCG BLOOD, ED (MC, WL, AP ONLY): I-stat hCG, quantitative: <5 m[IU]/mL (ref <5)

## 2017-02-10 LAB — CBC
HEMATOCRIT: 45.1 % (ref 36.0–46.0)
Hemoglobin: 15.1 g/dL — ABNORMAL HIGH (ref 12.0–15.0)
MCH: 32.7 pg (ref 26.0–34.0)
MCHC: 33.5 g/dL (ref 30.0–36.0)
MCV: 97.6 fL (ref 78.0–100.0)
PLATELETS: 263 10*3/uL (ref 150–400)
RBC: 4.62 MIL/uL (ref 3.87–5.11)
RDW: 15.3 % (ref 11.5–15.5)
WBC: 8.7 10*3/uL (ref 4.0–10.5)

## 2017-02-10 LAB — I-STAT TROPONIN, ED: Troponin i, poc: 0 ng/mL (ref 0.00–0.08)

## 2017-02-10 LAB — C-REACTIVE PROTEIN: CRP: <0.8 mg/dL (ref <1.0)

## 2017-02-10 LAB — SEDIMENTATION RATE: Sed Rate: 2 mm/hr (ref 0–22)

## 2017-02-10 MED ORDER — NITROGLYCERIN 0.4 MG SL SUBL
0.4000 mg | SUBLINGUAL_TABLET | SUBLINGUAL | Status: DC | PRN
  Filled 2017-02-10: qty 1

## 2017-02-10 MED ORDER — ASPIRIN 81 MG PO CHEW
324.0000 mg | CHEWABLE_TABLET | Freq: Once | ORAL | Status: DC
  Filled 2017-02-10: qty 4

## 2017-02-10 NOTE — ED Triage Notes (Signed)
Pt. Stated, I started having chest pain, back pain, arm pain like somebody was punching me. For 3 days.

## 2017-02-10 NOTE — ED Provider Notes (Signed)
[facility] DEPT Provider Note   CSN: [id number] Arrival date & time: 02/10/17  1156     History   Chief Complaint Chief Complaint  Patient presents with  . Chest Pain    HPI ZERENITY BOWRON is a 41 y.o. female.  HPI   Chest pain and back pain for 3 days, radiating down the right arm, punching pain.  Associated shortness of breath.  Took aleve without relief, hydrocodone helped some. Worsened by laying on right side.  Pain is worse with laying flat.  Not worse with exertion.  Now pain is 3-4/10.  Worse with deep breaths.  No n/v. Feeling hot but no fever. No diaphoresis.  Has headache, also started slowly, left sided 3-4/10. Had transient sensation of staring at TV and having blurred vision in both eyes "like the tv screen looked white", but reports vision now normal, no double vision or visual field deficit at any time, no new numbness/weakness from baseline.   Past Medical History:  Diagnosis Date  . Anxiety   . Arthritis    Takyasu Arthritis   . Benign tumor of back   . DVT (deep venous thrombosis) ([place])   . PVD (peripheral vascular disease) ([place])   . Stroke (cerebrum) ([place])   . Stroke [facility])    left sided deficits  . Swelling, lymph nodes 2006   intermittant, benign    Patient Active Problem List   Diagnosis Date Noted  . HLD (hyperlipidemia)   . Spastic hemiplegia affecting nondominant side ([place]) 09/10/2013  . Cognitive deficits, late effect of cerebrovascular disease 09/10/2013  . 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 Postprocedural respiratory failure ([place]) 02/16/2013  . Essential hypertension, malignant 02/16/2013  . Sinus tachycardia 02/16/2013  . CVA (cerebral infarction) 12/05/2012  . Subtherapeutic international normalized ratio (INR) 12/05/2012  . DEPRESSION 09/04/2006  . 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 CONSTIPATION 09/04/2006  . OVARIAN CYST 09/04/2006  . FOOT DROP, LEFT 09/04/2006    Past Surgical History:  Procedure Laterality Date  . BREAST  SURGERY     bil breast implants  . ivc filter placement     TrapEase IVC filter Safe 1.5T    OB History    Gravida Para Term Preterm AB Living   1         1   SAB TAB Ectopic Multiple Live Births                   Home Medications    Prior to Admission medications   Medication Sig Start Date End Date Taking? Authorizing Provider  Ascorbic Acid (VITAMIN C) 1000 MG tablet Take 1,000 mg by mouth daily.   Yes Historical Provider, MD  aspirin EC 81 MG EC tablet Take 1 tablet (81 mg total) by mouth daily. 07/01/15  Yes Burgess Estelle, MD  B Complex-C (B-COMPLEX WITH VITAMIN C) tablet Take 1 tablet by mouth daily.   Yes Historical Provider, MD  Biotin 1000 MCG tablet Take 1,000 mcg by mouth daily.   Yes Historical Provider, MD  CALCIUM CARBONATE-VITAMIN D PO Take 600 mg by mouth daily.    Yes Historical Provider, MD  folic acid (FOLVITE) 1 MG tablet Take 1 mg by mouth daily.   Yes Historical Provider, MD  fondaparinux (ARIXTRA) 7.5 MG/0.6ML SOLN injection Inject 7.5 mg into the skin every evening. 09/11/15  Yes Historical Provider, MD  HYDROcodone-acetaminophen (NORCO/VICODIN) 5-325 MG tablet Take 1 tablet by mouth every 6 (six) hours as needed for moderate pain. 09/12/15  Yes Rosemarie Ax, MD  Melatonin 5 MG TABS Take 5 mg by mouth at bedtime.    Yes Historical Provider, MD  Multiple Vitamin (MULTIVITAMIN) tablet Take 1 tablet by mouth daily.   Yes Historical Provider, MD  predniSONE (DELTASONE) 5 MG tablet Take 7.5 mg by mouth daily. 04/18/15  Yes Historical Provider, MD  tocilizumab (ACTEMRA) 400 MG/20ML SOLN injection Inject 400 mg/kg into the vein once a week.   Yes Historical Provider, MD  hydrochlorothiazide (HYDRODIURIL) 12.5 MG tablet Take 1 tablet (12.5 mg total) by mouth daily. 04/26/16   Tiffany Carlota Raspberry, PA-C  LORazepam (ATIVAN) 1 MG tablet Take 1 tablet (1 mg total) by mouth 3 (three) times daily as needed for anxiety. 04/26/16   Tiffany Carlota Raspberry, PA-C  megestrol (MEGACE) 40 MG tablet  Take 40 mg by mouth 2 times daily 5 days, continue 40 mg daily 5 days or until bleeding stops 04/16/16   Merryl Hacker, MD    Family History Family History  Problem Relation Age of Onset  . Cancer - Other Father   . Coronary artery disease Mother   . Stroke Mother   . Hypertension Mother   . Hypertension Sister     Social History Social History  Substance Use Topics  . Smoking status: Never Smoker  . Smokeless tobacco: Never Used  . Alcohol use No     Allergies   Pork-derived products   Review of Systems Review of Systems  Constitutional: Positive for fatigue. Negative for diaphoresis and fever.  HENT: Negative for sore throat.   Eyes: Negative for visual disturbance (had transient feeling of binocular blurred vision, lasted moments and improved, normal vision now).  Respiratory: Positive for shortness of breath. Negative for cough.   Cardiovascular: Positive for chest pain. Negative for leg swelling.  Gastrointestinal: Negative for abdominal pain, nausea and vomiting.  Genitourinary: Negative for difficulty urinating.  Musculoskeletal: Positive for back pain and gait problem (chronic). Negative for neck pain.  Skin: Negative for rash.  Neurological: Positive for headaches. Negative for syncope, facial asymmetry, weakness (no change from chronic, hx of stroke), light-headedness and numbness.     Physical Exam Updated Vital Signs BP (!) 169/84 (BP Location: Right Arm)   Pulse 70   Temp 98 F (36.7 C) (Oral)   Resp (!) 25   Ht '5\' 7"'$  (1.702 m)   Wt 185 lb (83.9 kg)   LMP 01/13/2017   SpO2 100%   BMI 28.98 kg/m   Physical Exam  Constitutional: She is oriented to person, place, and time. She appears well-developed and well-nourished. No distress.  HENT:  Head: Normocephalic and atraumatic.  Eyes: Conjunctivae and EOM are normal.  Neck: Normal range of motion.  Cardiovascular: Normal rate, regular rhythm, normal heart sounds and intact distal pulses.  Exam  reveals no gallop and no friction rub.   No murmur heard. Pulmonary/Chest: Effort normal and breath sounds normal. No respiratory distress. She has no wheezes. She has no rales.  Abdominal: Soft. She exhibits no distension. There is no tenderness. There is no guarding.  Musculoskeletal: She exhibits no edema or tenderness.  Neurological: She is alert and oriented to person, place, and time. GCS eye subscore is 4. GCS verbal subscore is 5. GCS motor subscore is 6.  Spastic hemiplegia of left side (baseline)  Skin: Skin is warm and dry. No rash noted. She is not diaphoretic. No erythema.  Nursing note and vitals reviewed.    ED Treatments / Results  Labs (all  labs ordered are listed, but only abnormal results are displayed) Labs Reviewed  BASIC METABOLIC PANEL - Abnormal; Notable for the following:       Result Value   Potassium 3.4 (*)    Glucose, Bld 114 (*)    All other components within normal limits  CBC - Abnormal; Notable for the following:    Hemoglobin 15.1 (*)    All other components within normal limits  SEDIMENTATION RATE  C-REACTIVE PROTEIN  I-STAT TROPOININ, ED  I-STAT BETA HCG BLOOD, ED ([facility], [facility], [facility] ONLY)    EKG  EKG Interpretation  Date/Time:  Sunday February 10 2017 12:00:32 EDT Ventricular Rate:  86 PR Interval:  128 QRS Duration: 78 QT Interval:  354 QTC Calculation: 423 R Axis:   78 Text Interpretation:  Normal sinus rhythm Normal ECG No significant change since last tracing Confirmed by [facility] MD, Junie Panning ([id number]) on 02/10/2017 2:39:22 PM       Radiology Dg Chest 2 View  Result Date: 02/10/2017 CLINICAL DATA:  41 year old female with history of chest pain, back pain and arm pain for the past 3 days. EXAM: CHEST  2 VIEW COMPARISON:  Chest x-ray 04/15/2016. FINDINGS: Lung volumes are normal. No consolidative airspace disease. No pleural effusions. No pneumothorax. No pulmonary nodule or mass noted. Pulmonary vasculature and the cardiomediastinal silhouette  are within normal limits. IMPRESSION: No radiographic evidence of acute cardiopulmonary disease. Electronically Signed   By: Vinnie Langton M.D.   On: 02/10/2017 13:24    Procedures Procedures (including critical care time)  Medications Ordered in ED Medications  aspirin chewable tablet 324 mg (324 mg Oral Not Given 02/10/17 1529)  nitroGLYCERIN (NITROSTAT) SL tablet 0.4 mg (not administered)     Initial Impression / Assessment and Plan / ED Course  I have reviewed the triage vital signs and the nursing notes.  Pertinent labs & imaging results that were available during my care of the patient were reviewed by me and considered in my medical decision making (see chart for details).    41yo female with history of Takayasu's arteritis, DVT, CVA with left sided hemiplegia, presents with concern for chest pain. Differential diagnosis for chest pain includes pulmonary embolus, dissection, pneumothorax, pneumonia, ACS, myocarditis, pericarditis.  EKG was done and evaluate by me and showed no acute ST changes and no signs of pericarditis. Chest x-ray was done and evaluated by me and radiology and showed no sign of pneumonia or pneumothorax. Discussed with Cardiology Dr. Aundra Dubin given hx of Takayasu's arteritis and felt that if she had no other signs of coronary ischemia on ECG or lab work with atypical symptoms she did not require further cardiac evaluation (for coronary arteritis) at this time.  ESR/CRP WNL. Pt with headache however no new neuro deficits, slow onset, doubt SAH.  Discussed with patient that I recommend CT angio to evaluate for PE, worsening of aortic arteritis or for signs of pulmonary arteritis or other pathology such as dissection. Also recommend second troponin for evaluation for ACS. Discussed given her hx of takayasu's would also consider other intracranial imaging (MRA) or neurology consultation for HA. Overall low suspicion for   Patient frustrated with IV sticks and states she  would like to leave against medical advice.  She understands the risks of death, disability, missed diagnoses.  Patient with stable vital signs, however left against medical advice after discussion of reasons to stay for further evaluation and risks.   Final Clinical Impressions(s) / ED Diagnoses   Final diagnoses:  Chest pain, unspecified type  Takayasu's arteritis ([place])  Nonintractable headache, unspecified chronicity pattern, unspecified headache type    New Prescriptions Discharge Medication List as of 02/10/2017  4:36 PM       Gareth Morgan, MD 02/10/17 1939

## 2017-02-10 NOTE — ED Notes (Signed)
Pt wheeled to room and assisted into gown and onto monitor.

## 2017-02-10 NOTE — ED Notes (Signed)
Pt ambulated to restroom, pt assisted into chair at the side of the bed.

## 2017-04-26 ENCOUNTER — Encounter (HOSPITAL_COMMUNITY): Payer: Self-pay

## 2017-04-26 ENCOUNTER — Inpatient Hospital Stay (HOSPITAL_COMMUNITY)
Admission: EM | Admit: 2017-04-26 | Discharge: 2017-04-28 | DRG: 378 | Disposition: A | Discharge status: Home / Self Care | Payer: 59 | Attending: Internal Medicine | Admitting: Internal Medicine

## 2017-04-26 DIAGNOSIS — F419 Anxiety disorder, unspecified: Secondary | ICD-10-CM | POA: Diagnosis present

## 2017-04-26 DIAGNOSIS — Z95828 Presence of other vascular implants and grafts: Secondary | ICD-10-CM

## 2017-04-26 DIAGNOSIS — I69354 Hemiplegia and hemiparesis following cerebral infarction affecting left non-dominant side: Secondary | ICD-10-CM | POA: Diagnosis not present

## 2017-04-26 DIAGNOSIS — M314 Aortic arch syndrome [Takayasu]: Secondary | ICD-10-CM | POA: Diagnosis not present

## 2017-04-26 DIAGNOSIS — K559 Vascular disorder of intestine, unspecified: Secondary | ICD-10-CM | POA: Diagnosis present

## 2017-04-26 DIAGNOSIS — Z79899 Other long term (current) drug therapy: Secondary | ICD-10-CM

## 2017-04-26 DIAGNOSIS — K921 Melena: Secondary | ICD-10-CM | POA: Diagnosis not present

## 2017-04-26 DIAGNOSIS — K922 Gastrointestinal hemorrhage, unspecified: Secondary | ICD-10-CM | POA: Diagnosis not present

## 2017-04-26 DIAGNOSIS — G8110 Spastic hemiplegia affecting unspecified side: Secondary | ICD-10-CM | POA: Diagnosis present

## 2017-04-26 DIAGNOSIS — M1990 Unspecified osteoarthritis, unspecified site: Secondary | ICD-10-CM | POA: Diagnosis present

## 2017-04-26 DIAGNOSIS — K625 Hemorrhage of anus and rectum: Secondary | ICD-10-CM | POA: Diagnosis present

## 2017-04-26 DIAGNOSIS — Z8673 Personal history of transient ischemic attack (TIA), and cerebral infarction without residual deficits: Secondary | ICD-10-CM

## 2017-04-26 DIAGNOSIS — Z91018 Allergy to other foods: Secondary | ICD-10-CM

## 2017-04-26 DIAGNOSIS — Z7982 Long term (current) use of aspirin: Secondary | ICD-10-CM

## 2017-04-26 DIAGNOSIS — Z7952 Long term (current) use of systemic steroids: Secondary | ICD-10-CM

## 2017-04-26 DIAGNOSIS — Z8249 Family history of ischemic heart disease and other diseases of the circulatory system: Secondary | ICD-10-CM

## 2017-04-26 HISTORY — DX: Essential (primary) hypertension: I10

## 2017-04-26 HISTORY — DX: Gastrointestinal hemorrhage, unspecified: K92.2

## 2017-04-26 LAB — TYPE AND SCREEN
ABO/RH(D): B POS
Antibody Screen: NEGATIVE

## 2017-04-26 LAB — COMPREHENSIVE METABOLIC PANEL
ALBUMIN: 3.9 g/dL (ref 3.5–5.0)
ALT: 18 U/L (ref 14–54)
ANION GAP: 10 (ref 5–15)
AST: 22 U/L (ref 15–41)
Alkaline Phosphatase: 45 U/L (ref 38–126)
BILIRUBIN TOTAL: 0.8 mg/dL (ref 0.3–1.2)
BUN: 8 mg/dL (ref 6–20)
CHLORIDE: 108 mmol/L (ref 101–111)
CO2: 24 mmol/L (ref 22–32)
Calcium: 8.8 mg/dL — ABNORMAL LOW (ref 8.9–10.3)
Creatinine, Ser: 0.89 mg/dL (ref 0.44–1.00)
GFR calc Af Amer: >60 mL/min (ref >60)
GFR calc non Af Amer: >60 mL/min (ref >60)
GLUCOSE: 116 mg/dL — AB (ref 65–99)
POTASSIUM: 3.7 mmol/L (ref 3.5–5.1)
SODIUM: 142 mmol/L (ref 135–145)
TOTAL PROTEIN: 6.2 g/dL — AB (ref 6.5–8.1)

## 2017-04-26 LAB — URINALYSIS, ROUTINE W REFLEX MICROSCOPIC
BACTERIA UA: NONE SEEN
Bilirubin Urine: NEGATIVE
GLUCOSE, UA: NEGATIVE mg/dL
KETONES UR: 5 mg/dL — AB
Leukocytes, UA: NEGATIVE
NITRITE: NEGATIVE
PROTEIN: NEGATIVE mg/dL
Specific Gravity, Urine: 1.011 (ref 1.005–1.030)
Squamous Epithelial / LPF: NONE SEEN
pH: 7 (ref 5.0–8.0)

## 2017-04-26 LAB — APTT: aPTT: 26 seconds (ref 24–36)

## 2017-04-26 LAB — CBC WITH DIFFERENTIAL/PLATELET
BASOS ABS: 0 10*3/uL (ref 0.0–0.1)
Basophils Relative: 0 %
EOS PCT: 1 %
Eosinophils Absolute: 0.1 10*3/uL (ref 0.0–0.7)
HEMATOCRIT: 43.3 % (ref 36.0–46.0)
Hemoglobin: 15.1 g/dL — ABNORMAL HIGH (ref 12.0–15.0)
LYMPHS ABS: 2.6 10*3/uL (ref 0.7–4.0)
LYMPHS PCT: 25 %
MCH: 34.6 pg — AB (ref 26.0–34.0)
MCHC: 34.9 g/dL (ref 30.0–36.0)
MCV: 99.1 fL (ref 78.0–100.0)
MONO ABS: 0.5 10*3/uL (ref 0.1–1.0)
Monocytes Relative: 5 %
NEUTROS ABS: 7.1 10*3/uL (ref 1.7–7.7)
Neutrophils Relative %: 69 %
Platelets: 226 10*3/uL (ref 150–400)
RBC: 4.37 MIL/uL (ref 3.87–5.11)
RDW: 13.8 % (ref 11.5–15.5)
WBC: 10.3 10*3/uL (ref 4.0–10.5)

## 2017-04-26 LAB — I-STAT BETA HCG BLOOD, ED (MC, WL, AP ONLY): I-stat hCG, quantitative: <5 m[IU]/mL (ref <5)

## 2017-04-26 LAB — PROTIME-INR
INR: 1.12
Prothrombin Time: 14.4 seconds (ref 11.4–15.2)

## 2017-04-26 LAB — VITAMIN B12: VITAMIN B 12: 936 pg/mL — AB (ref 180–914)

## 2017-04-26 LAB — POC OCCULT BLOOD, ED: FECAL OCCULT BLD: POSITIVE — AB

## 2017-04-26 MED ORDER — PREDNISONE 5 MG PO TABS
12.5000 mg | ORAL_TABLET | Freq: Every day | ORAL | Status: DC
  Administered 2017-04-27 – 2017-04-28 (×2): 12.5 mg via ORAL
  Filled 2017-04-26 (×3): qty 1

## 2017-04-26 MED ORDER — FOLIC ACID 1 MG PO TABS
1.0000 mg | ORAL_TABLET | Freq: Every day | ORAL | Status: DC
  Administered 2017-04-27 – 2017-04-28 (×2): 1 mg via ORAL
  Filled 2017-04-26 (×2): qty 1

## 2017-04-26 MED ORDER — LORAZEPAM 2 MG/ML IJ SOLN
0.5000 mg | Freq: Once | INTRAMUSCULAR | Status: AC
  Administered 2017-04-26: 0.5 mg via INTRAVENOUS
  Filled 2017-04-26: qty 1

## 2017-04-26 NOTE — ED Provider Notes (Signed)
[facility] DEPT Provider Note   CSN: [id number] Arrival date & time: 04/26/17  1018     History   Chief Complaint No chief complaint on file.   HPI [name] is a 41 y.o. female.  HPI   [name] is a 41 y.o. female, with a history of Stroke, DVT, presenting to the ED with rectal bleeding beginning two days ago. Also endorses lower abdominal discomfort. Denies current pain. Bleeding is bright red, mixed with loose stool, and fills the toilet. Also endorses nausea, fatigue, and generalized weakness.  Patient states she has poor bowel and bladder control at baseline due to her stroke. Stroke was in 2014 and gave her left-sided weakness and decreased sensation in her abdomen.   Denies alcohol use. Uses naproxen daily.  Denies fevers/chills, vomiting, Trauma, previous GI bleed, or any other complaints.  Takes a weight loss supplement called Garcinia cambogia.      Past Medical History:  Diagnosis Date  . Anxiety   . Arthritis    Takyasu Arthritis   . Benign tumor of back   . DVT (deep venous thrombosis) ([place])   . PVD (peripheral vascular disease) ([place])   . Stroke (cerebrum) ([place])   . Stroke [facility])    left sided deficits  . Swelling, lymph nodes 2006   intermittant, benign    Patient Active Problem List   Diagnosis Date Noted  . Bleeding per rectum 04/26/2017  . HLD (hyperlipidemia)   . Spastic hemiplegia affecting nondominant side ([place]) 09/10/2013  . Cognitive deficits, late effect of cerebrovascular disease 09/10/2013  . 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 Postprocedural respiratory failure ([place]) 02/16/2013  . Essential hypertension, malignant 02/16/2013  . Sinus tachycardia 02/16/2013  . CVA (cerebral infarction) 12/05/2012  . Subtherapeutic international normalized ratio (INR) 12/05/2012  . DEPRESSION 09/04/2006  . 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 CONSTIPATION 09/04/2006  . OVARIAN CYST 09/04/2006  . FOOT DROP, LEFT 09/04/2006    Past  Surgical History:  Procedure Laterality Date  . BREAST SURGERY     bil breast implants  . ivc filter placement     TrapEase IVC filter Safe 1.5T    OB History    Gravida Para Term Preterm AB Living   1         1   SAB TAB Ectopic Multiple Live Births                   Home Medications    Prior to Admission medications   Medication Sig Start Date End Date Taking? Authorizing Provider  aspirin EC 81 MG EC tablet Take 1 tablet (81 mg total) by mouth daily. 07/01/15  Yes Burgess Estelle, MD  B Complex Vitamins (VITAMIN B COMPLEX PO) Take 1 tablet by mouth daily. Super B Complex   Yes [provider]  Biotin 1000 MCG tablet Take 1,000 mcg by mouth daily.   Yes [provider]  CALCIUM CARBONATE-VITAMIN D PO Take 600 mg by mouth daily.    Yes [provider]  folic acid (FOLVITE) 1 MG tablet Take 1 mg by mouth daily.   Yes [provider]  fondaparinux (ARIXTRA) 7.5 MG/0.6ML SOLN injection Inject 7.5 mg into the skin every evening. 09/11/15  Yes [provider]  Melatonin 5 MG TABS Take 5 mg by mouth at bedtime.    Yes [provider]  Multiple Vitamin (MULTIVITAMIN) tablet Take 1 tablet by mouth daily.   Yes [provider]  predniSONE (DELTASONE)  5 MG tablet Take 7.5 mg by mouth daily. 04/18/15  Yes [provider]  tocilizumab (ACTEMRA) 400 MG/20ML SOLN injection Inject 400 mg/kg into the vein once a week. Take on Saturday   Yes [provider]  vitamin C (ASCORBIC ACID) 250 MG tablet Take 250 mg by mouth daily.   Yes [provider]  hydrochlorothiazide (HYDRODIURIL) 12.5 MG tablet Take 1 tablet (12.5 mg total) by mouth daily. Patient not taking: Reported on 04/26/2017 04/26/16   Delos Haring, PA-C  HYDROcodone-acetaminophen (NORCO/VICODIN) 5-325 MG tablet Take 1 tablet by mouth every 6 (six) hours as needed for moderate pain. Patient not taking: Reported on 04/26/2017 09/12/15   Rosemarie Ax, MD    LORazepam (ATIVAN) 1 MG tablet Take 1 tablet (1 mg total) by mouth 3 (three) times daily as needed for anxiety. Patient not taking: Reported on 04/26/2017 04/26/16   Delos Haring, PA-C  megestrol (MEGACE) 40 MG tablet Take 40 mg by mouth 2 times daily 5 days, continue 40 mg daily 5 days or until bleeding stops Patient not taking: Reported on 04/26/2017 04/16/16   Horton, Barbette Hair, MD    Family History Family History  Problem Relation Age of Onset  . Cancer - Other Father   . Coronary artery disease Mother   . Stroke Mother   . Hypertension Mother   . Hypertension Sister     Social History Social History  Substance Use Topics  . Smoking status: Never Smoker  . Smokeless tobacco: Never Used  . Alcohol use No     Allergies   Pork-derived products   Review of Systems Review of Systems  Constitutional: Positive for fatigue. Negative for chills, diaphoresis and fever.  Respiratory: Negative for shortness of breath.   Cardiovascular: Negative for chest pain.  Gastrointestinal: Positive for blood in stool, diarrhea and nausea. Negative for vomiting.       Lower abdominal pressure  Neurological: Positive for weakness (generalized). Negative for dizziness, light-headedness and headaches.  All other systems reviewed and are negative.    Physical Exam Updated Vital Signs BP (!) 154/99 (BP Location: Right Arm)   Pulse 89   Temp 98.4 F (36.9 C) (Oral)   Resp 16   SpO2 99%   Physical Exam  Constitutional: She appears well-developed and well-nourished. No distress.  HENT:  Head: Normocephalic and atraumatic.  Eyes: Conjunctivae are normal.  Neck: Neck supple.  Cardiovascular: Normal rate, regular rhythm, normal heart sounds and intact distal pulses.   Pulmonary/Chest: Effort normal and breath sounds normal. No respiratory distress.  Abdominal: Soft. There is no tenderness. There is no guarding.  Genitourinary:  Genitourinary Comments: No external hemorrhoids, fissures,  or lesions noted. Scant, gross red blood. Weak rectal tone, which patient states is her baseline. No stool burden. No rectal tenderness. Med Tech, Benjamine Mola, served as Producer, television/film/video during the rectal exam.  Musculoskeletal: She exhibits no edema.  Lymphadenopathy:    She has no cervical adenopathy.  Neurological: She is alert.  Skin: Skin is warm and dry. She is not diaphoretic.  Psychiatric: She has a normal mood and affect. Her behavior is normal.  Nursing note and vitals reviewed.    ED Treatments / Results  Labs (all labs ordered are listed, but only abnormal results are displayed) Labs Reviewed  COMPREHENSIVE METABOLIC PANEL - Abnormal; Notable for the following:       Result Value   Glucose, Bld 116 (*)    Calcium 8.8 (*)    Total Protein 6.2 (*)  All other components within normal limits  CBC WITH DIFFERENTIAL/PLATELET - Abnormal; Notable for the following:    Hemoglobin 15.1 (*)    MCH 34.6 (*)    All other components within normal limits  POC OCCULT BLOOD, ED - Abnormal; Notable for the following:    Fecal Occult Bld POSITIVE (*)    All other components within normal limits  PROTIME-INR  APTT  URINALYSIS, ROUTINE W REFLEX MICROSCOPIC  I-STAT BETA HCG BLOOD, ED ([facility], [facility], [facility] ONLY)  TYPE AND SCREEN    EKG  EKG Interpretation None       Radiology No results found.  Procedures Procedures (including critical care time)  Medications Ordered in ED Medications - No data to display   Initial Impression / Assessment and Plan / ED Course  I have reviewed the triage vital signs and the nursing notes.  Pertinent labs & imaging results that were available during my care of the patient were reviewed by me and considered in my medical decision making (see chart for details).  Clinical Course as of Apr 27 1331  Fri Apr 26, 2017  1201 Spoke with Shawnie Pons, IM resident, who agreed to come assess and admit the patient.  [SJ]    Clinical Course User Index [SJ] Venus Gilles  C, PA-C     Patient presents with bright red GI bleeding. Vital signs are stable. Hemoglobin normal. Admission via hospitalist with GI consult.   Findings and plan of care discussed with Zenovia Jarred, MD. Dr. Thomasene Lot personally evaluated and examined this patient.     Final Clinical Impressions(s) / ED Diagnoses   Final diagnoses:  Hematochezia    New Prescriptions New Prescriptions   No medications on file     Layla Maw 04/26/17 1332    [place], Fredia Sorrow, MD 04/30/17 1551

## 2017-04-26 NOTE — ED Notes (Signed)
Pt brought to room via wheelchair. Pt refusing to get into the bed stating she does not want to at this time and needs to take things slow due to her weakness. This tech brought a bedside commode for Pt to use and is on at this time. Call light in reach.

## 2017-04-26 NOTE — H&P (Signed)
Date: 04/26/2017               Patient Name:  [name] MRN: [id number]  DOB: Nov 05, 1976 Age / Sex: 41 y.o., female   PCP: Patient, No Pcp Per         Medical Service: Internal Medicine Teaching Service         Attending Physician: Dr. Sid Falcon, MD    First Contact: Dr. Lovena Le Pager: [phone]  Second Contact: Dr. 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  Over the last 2-3 days, she has had more frequent bowel movements since starting a weight loss supplement [Garcinia cambogia]. Last night, she noticed blood in her sheets after she had taken a shower, so she cleaned herself up again. This morning, she awoke with blood on her bed sheets and proceeded to the ED for further evaluation. Her period started a few days ago, lasted 5 days, and stopped, so she is sure that the bleeding is from her rectum given that she saw blood mixed with her bowel. She takes naproxen daily as she is trying to wean herself of her daily prednisone 12.5 mg which she takes for Takayasu's arteritis. Given her history of DVTs, she had an IVC filter placed in 2014 and is on daily anticoagulation with fondaparinux. She denies any abdominal pain, vomiting, dysuria, hematuria, dizziness, weight loss, fever though feels short of breath and with low energy at baseline and had mild nausea this morning. Since her stroke, she spends most of her time in a recliner watching TV but denies any tobacco, alcohol, or illicit drug use.   In the ED, FOBT was positive though Hb was at 15, stable from 3 months ago and above 8-9  from 2017 when she had heavy menses. PT/INR were reassuring at 14.4 and 1.12, respectively. GI was consulted for further evaluation, and type & screen was ordered.   Meds:  Current Meds  Medication Sig  . aspirin EC 81 MG EC tablet Take 1 tablet (81 mg total) by mouth daily.  . B Complex Vitamins (VITAMIN B COMPLEX PO) Take 1 tablet by mouth daily. Super B Complex  . Biotin 1000 MCG tablet Take 1,000 mcg by mouth daily.  Marland Kitchen CALCIUM CARBONATE-VITAMIN D PO Take 600 mg by mouth daily.   . folic acid (FOLVITE) 1 MG tablet Take 1 mg by mouth daily.  . fondaparinux (ARIXTRA) 7.5 MG/0.6ML SOLN injection Inject 7.5 mg into the skin every evening.  . Melatonin 5 MG TABS Take 5 mg by mouth at bedtime.   . Multiple Vitamin (MULTIVITAMIN) tablet Take 1 tablet by mouth daily.  . predniSONE (DELTASONE) 5 MG tablet Take 7.5 mg by mouth daily.  Marland Kitchen tocilizumab (ACTEMRA) 400 MG/20ML SOLN injection Inject 400 mg/kg into the vein once a week. Take on Saturday  . vitamin C (ASCORBIC ACID) 250 MG tablet Take 250 mg by mouth daily.     Allergies: Allergies as of 04/26/2017 - Review Complete 04/26/2017  Allergen Reaction Noted  . Pork-derived products Other (See Comments) 02/23/2013   Past Medical History:  Diagnosis Date  . Anxiety   . Arthritis    Takyasu Arthritis   . Benign tumor of back   . DVT (deep venous thrombosis) ([place])   . PVD (peripheral vascular disease) ([place])   . Stroke (cerebrum) ([place])   . Stroke [facility])    left sided deficits  . Swelling, lymph nodes 2006   intermittant, benign    Family History: No members with colon cancer.  Social History: Used to be in the [employer].  Review of Systems: A complete ROS was negative except as per HPI.   Physical Exam: Blood pressure (!) 154/99, pulse 89, temperature 98.4 F (36.9 C), temperature source Oral, resp. rate 16, SpO2 99 %. Physical Exam  Constitutional: She is oriented to person, place, and time. No distress.  HENT:  Head:  Normocephalic and atraumatic.  Eyes: Conjunctivae are normal. No scleral icterus.  Cardiovascular: Normal rate and normal heart sounds.   Pulmonary/Chest: Effort normal. No respiratory distress.  Abdominal: Soft. Bowel sounds are normal. She exhibits no distension and no mass. There is no tenderness. There is no rebound and no guarding.  Neurological: She is alert and oriented to person, place, and time.  Skin: Skin is warm and dry. She is not diaphoretic.    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  Rectal bleeding: Suspect it is more likely a lower GI source, possibly a diverticular bleed from irritation in the setting of increased bowel movements from her weight loss supplement with ongoing use of NSAIDs and anticoagulation. Upper GI source is less likely in the absence of nausea, vomiting, epigastric abdominal pain. Given her history of Takyasu's arteritis, ischemic colitis is another consideration though is less likely given the time course of symptoms, adherence to her treatment, absence of abdominal pain, and absence of peritoneal signs.  No colonoscopy on file for review. Her vital signs are reassuring for no dehydration and consistent with stable hemoglobin.  -Holding aspirin and fondaparinux for tonight -Give pantoprazole 40 mg daily with meals -Follow-up GI recommendations -Order clear diet for now -Recommend she hold weight loss supplement on discharge  Takayasu's arteritis: Continue prednisone 12.5 mg daily  -Confirm dose of tocilizumab weekly [Saturday] prior to  ordering it  History of CVA with residual left-sided hemiparesis: Holding aspirin as noted above.   Fatigue: Unclear duration of symptoms. -Check B12 given uptrending MCV per review of last CBC  Dispo: Admit patient to Observation with expected length of stay less than 2 midnights.  Signed: Riccardo Dubin, MD 04/26/2017, 1:37 PM  Pager: [phone]

## 2017-04-26 NOTE — ED Notes (Signed)
Unsuccesful IV attempts.  Consulting IV team.  Patient also requests no sticks in left arm.

## 2017-04-26 NOTE — ED Triage Notes (Addendum)
Patient complains of rectal bleeding and lower abdominal cramping x4 days.  Patient reports bright red blood in stool and frequent bowel movements.  Patient states she is normally unable to tell when she needs to have a bowel movement or urinate since a stroke in 2014, so she has been using the bathroom every four hours.  Patient alert and oriented, complains of weakness.  Able to stand and move to bedside commode without assistance.  Patient refusing to allow staff to obtain vital signs at this time stating "I'm not ready to do that."

## 2017-04-26 NOTE — ED Notes (Signed)
Patient given chicken broth. 

## 2017-04-27 DIAGNOSIS — M1990 Unspecified osteoarthritis, unspecified site: Secondary | ICD-10-CM | POA: Diagnosis present

## 2017-04-27 DIAGNOSIS — K625 Hemorrhage of anus and rectum: Secondary | ICD-10-CM | POA: Diagnosis not present

## 2017-04-27 DIAGNOSIS — K921 Melena: Secondary | ICD-10-CM

## 2017-04-27 DIAGNOSIS — Z7901 Long term (current) use of anticoagulants: Secondary | ICD-10-CM | POA: Diagnosis not present

## 2017-04-27 DIAGNOSIS — Z95828 Presence of other vascular implants and grafts: Secondary | ICD-10-CM | POA: Diagnosis not present

## 2017-04-27 DIAGNOSIS — Z7952 Long term (current) use of systemic steroids: Secondary | ICD-10-CM | POA: Diagnosis not present

## 2017-04-27 DIAGNOSIS — M314 Aortic arch syndrome [Takayasu]: Secondary | ICD-10-CM

## 2017-04-27 DIAGNOSIS — Z7982 Long term (current) use of aspirin: Secondary | ICD-10-CM | POA: Diagnosis not present

## 2017-04-27 DIAGNOSIS — F419 Anxiety disorder, unspecified: Secondary | ICD-10-CM | POA: Diagnosis present

## 2017-04-27 DIAGNOSIS — Z8673 Personal history of transient ischemic attack (TIA), and cerebral infarction without residual deficits: Secondary | ICD-10-CM | POA: Diagnosis not present

## 2017-04-27 DIAGNOSIS — Z8249 Family history of ischemic heart disease and other diseases of the circulatory system: Secondary | ICD-10-CM | POA: Diagnosis not present

## 2017-04-27 DIAGNOSIS — K922 Gastrointestinal hemorrhage, unspecified: Secondary | ICD-10-CM | POA: Diagnosis present

## 2017-04-27 DIAGNOSIS — Z91018 Allergy to other foods: Secondary | ICD-10-CM | POA: Diagnosis not present

## 2017-04-27 DIAGNOSIS — K559 Vascular disorder of intestine, unspecified: Secondary | ICD-10-CM

## 2017-04-27 DIAGNOSIS — I69354 Hemiplegia and hemiparesis following cerebral infarction affecting left non-dominant side: Secondary | ICD-10-CM | POA: Diagnosis not present

## 2017-04-27 DIAGNOSIS — Z79899 Other long term (current) drug therapy: Secondary | ICD-10-CM | POA: Diagnosis not present

## 2017-04-27 LAB — CBC
HCT: 40.6 % (ref 36.0–46.0)
HEMATOCRIT: 38.7 % (ref 36.0–46.0)
Hemoglobin: 13 g/dL (ref 12.0–15.0)
Hemoglobin: 13.5 g/dL (ref 12.0–15.0)
MCH: 33.5 pg (ref 26.0–34.0)
MCH: 33.3 pg (ref 26.0–34.0)
MCHC: 33.6 g/dL (ref 30.0–36.0)
MCHC: 33.3 g/dL (ref 30.0–36.0)
MCV: 99.7 fL (ref 78.0–100.0)
MCV: 100 fL (ref 78.0–100.0)
Platelets: 216 10*3/uL (ref 150–400)
Platelets: 225 10*3/uL (ref 150–400)
RBC: 3.88 MIL/uL (ref 3.87–5.11)
RBC: 4.06 MIL/uL (ref 3.87–5.11)
RDW: 14 % (ref 11.5–15.5)
RDW: 13.7 % (ref 11.5–15.5)
WBC: 9 10*3/uL (ref 4.0–10.5)
WBC: 9 10*3/uL (ref 4.0–10.5)

## 2017-04-27 LAB — HIV ANTIBODY (ROUTINE TESTING W REFLEX): HIV Screen 4th Generation wRfx: NONREACTIVE

## 2017-04-27 MED ORDER — PANTOPRAZOLE SODIUM 40 MG IV SOLR
40.0000 mg | Freq: Two times a day (BID) | INTRAVENOUS | Status: DC
  Administered 2017-04-27 – 2017-04-28 (×3): 40 mg via INTRAVENOUS
  Filled 2017-04-27 (×3): qty 40

## 2017-04-27 MED ORDER — PEG-KCL-NACL-NASULF-NA ASC-C 100 G PO SOLR: 1.0000 | Freq: Once | ORAL | Status: DC

## 2017-04-27 MED ORDER — PEG-KCL-NACL-NASULF-NA ASC-C 100 G PO SOLR
0.5000 | Freq: Once | ORAL | Status: AC
  Administered 2017-04-27: 100 g via ORAL
  Filled 2017-04-27: qty 1

## 2017-04-27 NOTE — Progress Notes (Signed)
Patient is refusing bed alarm. Her husband is in her room and they said that will call for help when she needs to get out of bed. Will continue to monitor.

## 2017-04-27 NOTE — Progress Notes (Signed)
  Date: 04/27/2017  Patient name: [name]  Medical record number: [id number]  Date of birth: Mar 22, 1976   I have seen and evaluated Jackey Loge and discussed their care with the Residency Team. Briefly, Ms. 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  She notes that over the last 2-3 days she has had an increase in bowel movements and possibly some blood in the BM.  She notes bright red blood in the toilet bowl and possibly some darkened stool though it was hard to tell.  She has just come off her period, so she initially chalked the symptoms up to menstrual blood.  However, when her period stopped, the bleeding continued.  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  She has not had any abdominal pain, vomiting (1 episode of nausea in the ED), dizziness, weight loss, fever.  She does note fatigue at baseline.  In the ED, her H/H were stable, FOBT was positive.  She has had no further episodes of bleeding.    Fam Hx, and/or Soc Hx : Denies h/o colon cancer.  Non smoker  Vitals:   04/26/17 2109 04/27/17 0530  BP: 128/64 (!) 143/85  Pulse: 90 77  Resp: 17 18  Temp: 98.3 F (36.8 C) 97.8 F (36.6 C)   Physical Exam:  Gen: Sitting up in chair, conversant Eyes: Anicteric sclerae, no conjunctival pallor CV: RR, nR, no murmur Pulm: CTAB, no wheezing Abd: Soft, +BS, NT Ext: Thin, no edema Neuro: Left arm 0/5 strength, chronic.  Decreased strength in left leg, uses cane.  Skin: No rash or wound GU: Declined rectal exam  Pertinent data Hgb 15.1 - -> 13 Cr 0.89 WBC 10.3 INR 1.12  Assessment and Plan: I have seen and evaluated the patient as outlined above. I agree with the formulated Assessment and Plan as detailed in the residents' note, with the following  changes:   1. Lower GI bleed - Likely lower GI in source, internal hemorrhoids vs. Diverticular vs. AVM.  She had possibly some mild darkening of stool and has many risk factors for an UGIB, however, she has no abdominal pain, gerd symptoms or other signs of upper bleeding - GI consult placed - Trend Hgb - Monitor for repeat bleeding, transfuse for Hgb < 7 - Hold naproxen, prednisone, aspirin, fondiparinux for now - though these medications will likely need to be restarted (with the exception of naproxen) - PPI - Agree with holding weight loss supplement  Other issues per Dr. Serita Grit admit note.   MULLEN, EMILY

## 2017-04-27 NOTE — Progress Notes (Signed)
   Subjective: No acute events overnight. Patient has not had an additional bowel movement since yesterday. No additional bright red blood per rectum. Feeling well this morning. Only complaint is being hungry.  Objective:  Vital signs in last 24 hours: Vitals:   04/26/17 1508 04/26/17 1707 04/26/17 2109 04/27/17 0530  BP: (!) 140/95 (!) 141/79 128/64 (!) 143/85  Pulse: 86 87 90 77  Resp: 18 18 17 18   Temp:  98.4 F (36.9 C) 98.3 F (36.8 C) 97.8 F (36.6 C)  TempSrc:  Oral Oral Oral  SpO2: 100% 100% 96% 100%  Weight:  82 kg (180 lb 11.2 oz)    Height:  5\' 7"  (1.702 m)     Physical Exam  Constitutional: She is oriented to person, place, and time. She appears well-developed and well-nourished.  HENT:  Head: Normocephalic and atraumatic.  Cardiovascular: Normal rate and regular rhythm.   Murmur heard. I'll systolic murmur appreciated best at the right upper sternal border  Respiratory: Effort normal.  GI: Soft. Bowel sounds are normal. She exhibits no distension. There is no tenderness.  Musculoskeletal: She exhibits no edema.  Neurological: She is alert and oriented to person, place, and time.     Assessment/Plan:  Principal Problem:   Bleeding per rectum Active Problems:   TAKAYASU'S ARTERITIS   History of CVA (cerebrovascular accident)   Spastic hemiplegia affecting nondominant side ([facility])   S/P IVC filter  # GI Bleed Patient with a complicated past medical history including chronic prednisone use and anticoagulation presents with a 72 hour history of bright red blood per rectum. She does have risk factors for upper GI bleed however she has no nausea, vomiting or abdominal pain. BUN also normal. I suspect her bleeding source is most likely lower GI tract. Rectal exam per ED note shows no external hemorrhoids or fissures. Presentation may be consistent with diverticulosis. Gastroenterology following and will plan for colonoscopy tomorrow. She'll have a clear liquid diet  today. CBC every 8 hours. Transfuse for hemoglobin less than 7. I think going forward with colonoscopy is appropriate in this patient given the need for chronic anticoagulation. For now we'll hold anticoagulation and hope to resume tomorrow. -- Clear liquid diet -- Nothing by mouth at midnight for possible colonoscopy tomorrow -- CBC every 8 hours, transfuse for hemoglobin less than 7 -- GI following and we greatly appreciate their ongoing recommendations  # Takayasu's arteritis --Confirm dose of tocilizumab  -- Continue prednisone 12.5 mg daily  # History of CVA with residual left-sided deficits -- Hold aspirin  DVT/PE prophylaxis: SCDs, hold anticoagulation FEN/GI: Clear liquids Code: Full code  Dispo: Anticipated discharge in approximately 1-2 day(s).   Ophelia Shoulder, MD 04/27/2017, 11:23 AM Pager: [phone]

## 2017-04-27 NOTE — Consult Note (Addendum)
Consultation  Referring Provider: Dr. Daryll Drown     Primary Care Physician:  Patient, No Pcp Per Primary Gastroenterologist:   Althia Forts      Reason for Consultation:  Rectal Bleeding            HPI:   JAKHIYA BROWER is a 41 y.o. female with a past medical history anxiety, DVT, hypertension, PVD, stroke on ASA and sq Arixira with residual left-sided hemiparalysis, history of DVT status post IVC filter, (07/01/15 LVEF 60-65%), who presented to the ED on 04/26/17 with rectal bleeding.   Today, the patient describes that over the past 2-3 days she has had an increase in frequency of her bowel movements after starting a weight loss supplement (Garcinia cambogia). The patient tells me that she has noticed some bright red blood mixed in with her stool over the past 3 days typically 2-3 times a day. Last night she noticed bright red blood on her sheets after lying down to take a shower. Cleaned up and then awoke again with bright red blood and that she proceed to the ER. The patient's last bloody stool was around 3:00 yesterday afternoon after drinking some chicken brush immediately felt as though she had to go to the bathroom. This was a somewhat darker/clotted stool and abdominal surrounded by bright red blood. Patient does report that her period started a about 5 days ago and has since stopped. Patient does tell me that she is limited feeling in her abdomen due to being post stroke. Says his symptoms do include some nausea yesterday. Associated symptoms include shortness of breath and decreased energy.   Past medical history is positive for using prednisone 12.5 mg daily for Takayasu's arthritis and naproxen 440 mg at night for the past 1-2 months. She also has a history of DVTs for which she had an IVC filter placed on 2014.   Patient denies fever, chills, abdominal pain, and vomiting.  ER course :In the ED, FOBT was positive though Hb was at 15, stable from 3 months ago and above 8-9 from 2017 when she had  heavy menses. PT/INR were reassuring at 14.4 and 1.12, respectively. GI was consulted for further evaluation, and type & screen was ordered.  Previous GI evaluations: None  Past Medical History:  Diagnosis Date  . Anxiety   . Arthritis    Takyasu Arthritis   . Benign tumor of back   . DVT (deep venous thrombosis) ([place])   . Hypertension   . PVD (peripheral vascular disease) ([place])   . Stroke (cerebrum) ([place])   . Stroke [facility])    left sided deficits  . Swelling, lymph nodes 2006   intermittant, benign    Past Surgical History:  Procedure Laterality Date  . BREAST SURGERY     bil breast implants  . ivc filter placement     TrapEase IVC filter Safe 1.5T    Family History  Problem Relation Age of Onset  . Cancer - Other Father   . Coronary artery disease Mother   . Stroke Mother   . Hypertension Mother   . Hypertension Sister     Social History  Substance Use Topics  . Smoking status: Never Smoker  . Smokeless tobacco: Never Used  . Alcohol use No    Prior to Admission medications   Medication Sig Start Date End Date Taking? Authorizing Provider  aspirin EC 81 MG EC tablet Take 1 tablet (81 mg total) by mouth daily. 07/01/15  Yes Tiburcio Pea,  Orson Slick, MD  B Complex Vitamins (VITAMIN B COMPLEX PO) Take 1 tablet by mouth daily. Super B Complex   Yes [provider]  Biotin 1000 MCG tablet Take 1,000 mcg by mouth daily.   Yes [provider]  CALCIUM CARBONATE-VITAMIN D PO Take 600 mg by mouth daily.    Yes [provider]  folic acid (FOLVITE) 1 MG tablet Take 1 mg by mouth daily.   Yes [provider]  fondaparinux (ARIXTRA) 7.5 MG/0.6ML SOLN injection Inject 7.5 mg into the skin every evening. 09/11/15  Yes [provider]  Melatonin 5 MG TABS Take 5 mg by mouth at bedtime.    Yes [provider]  Multiple Vitamin (MULTIVITAMIN) tablet Take 1 tablet by mouth daily.   Yes [provider]  predniSONE (DELTASONE) 5  MG tablet Take 7.5 mg by mouth daily. 04/18/15  Yes [provider]  tocilizumab (ACTEMRA) 400 MG/20ML SOLN injection Inject 400 mg/kg into the vein once a week. Take on Saturday   Yes [provider]  vitamin C (ASCORBIC ACID) 250 MG tablet Take 250 mg by mouth daily.   Yes [provider]  hydrochlorothiazide (HYDRODIURIL) 12.5 MG tablet Take 1 tablet (12.5 mg total) by mouth daily. Patient not taking: Reported on 04/26/2017 04/26/16   Delos Haring, PA-C  HYDROcodone-acetaminophen (NORCO/VICODIN) 5-325 MG tablet Take 1 tablet by mouth every 6 (six) hours as needed for moderate pain. Patient not taking: Reported on 04/26/2017 09/12/15   Rosemarie Ax, MD  LORazepam (ATIVAN) 1 MG tablet Take 1 tablet (1 mg total) by mouth 3 (three) times daily as needed for anxiety. Patient not taking: Reported on 04/26/2017 04/26/16   Delos Haring, PA-C  megestrol (MEGACE) 40 MG tablet Take 40 mg by mouth 2 times daily 5 days, continue 40 mg daily 5 days or until bleeding stops Patient not taking: Reported on 04/26/2017 04/16/16   Merryl Hacker, MD    Current Facility-Administered Medications  Medication Dose Route Frequency Provider Last Rate Last Dose  . folic acid (FOLVITE) tablet 1 mg  1 mg Oral Daily Patel, Rushil V, MD      . pantoprazole (PROTONIX) injection 40 mg  40 mg Intravenous Q12H Zada Finders, MD      . predniSONE (DELTASONE) tablet 12.5 mg  12.5 mg Oral Q breakfast Riccardo Dubin, MD        Allergies as of 04/26/2017 - Review Complete 04/26/2017  Allergen Reaction Noted  . Pork-derived products Other (See Comments) 02/23/2013     Review of Systems:    Constitutional: No weight loss, fever or chills Skin: No rash Cardiovascular: No chest pain Respiratory: Positive for some SOB Gastrointestinal: See HPI and otherwise negative Genitourinary: No dysuria  Neurological: No headache Musculoskeletal: No new muscle or joint pain Hematologic: No  bruising Psychiatric: No history of depression or anxiety Remainder systems negative except as above in history  Physical Exam:  Vital signs in last 24 hours: Temp:  [97.8 F (36.6 C)-98.4 F (36.9 C)] 97.8 F (36.6 C) (06/02 0530) Pulse Rate:  [77-90] 77 (06/02 0530) Resp:  [16-18] 18 (06/02 0530) BP: (128-161)/(64-99) 143/85 (06/02 0530) SpO2:  [96 %-100 %] 100 % (06/02 0530) Weight:  [180 lb 11.2 oz (82 kg)] 180 lb 11.2 oz (82 kg) (06/01 1707) Last BM Date: 04/26/17 General:   Pleasant African American female appears to be in NAD, Well developed, Well nourished, alert and cooperative Head:  Normocephalic and atraumatic. Eyes:  PEERL, EOMI. No icterus. Conjunctiva pink. Ears:  Normal auditory acuity. Neck:  Supple Throat: Oral cavity and pharynx without inflammation, swelling or lesion. Teeth in good condition. Lungs: Respirations even and unlabored. Lungs clear to auscultation bilaterally.   No wheezes, crackles, or rhonchi.  Heart: Normal S1, S2. No MRG. Regular rate and rhythm. No peripheral edema, cyanosis or pallor.  Abdomen:  Soft, nondistended, nontender. No rebound or guarding. Normal bowel sounds. No appreciable masses or hepatomegaly. Rectal:  External: Bright red blood residue on underwear and buttocks; Internal exam: no stool or blood.  Msk:  Symmetrical without gross deformities. Peripheral pulses intact. Uses cane to ambulate Extremities:  Without edema, no deformity or joint abnormality.  Neurologic:  Alert and  oriented x4;  grossly normal neurologically. Left sided hemiparesis Skin:   Dry and intact without significant lesions or rashes. Psychiatric:  Demonstrates good judgement and reason without abnormal affect or behaviors.   LAB RESULTS:  Recent Labs  04/26/17 1035 04/27/17 0552  WBC 10.3 9.0  HGB 15.1* 13.0  HCT 43.3 38.7  PLT 226 216   BMET  Recent Labs  04/26/17 1035  NA 142  K 3.7  CL 108  CO2 24  GLUCOSE 116*  BUN 8  CREATININE 0.89   CALCIUM 8.8*   LFT  Recent Labs  04/26/17 1035  PROT 6.2*  ALBUMIN 3.9  AST 22  ALT 18  ALKPHOS 45  BILITOT 0.8   PT/INR  Recent Labs  04/26/17 1035  LABPROT 14.4  INR 1.12    PREVIOUS ENDOSCOPIES:            None   Impression / Plan:   Impression: 1. Rectal Bleeding: Patient reports this started 2-3 days ago, occurs 2-3 times a day, has slowed some since being here with her last stool yesterday afternoon around 3 or 4, none since, drop in hemoglobin from 15.1-13.0 overnight, patient on chronic anticoagulation due to IVC filter placement, this has been held, last taken Thursday morning; likely lower GI bleed, most likely diverticular 2. Status post IVC filter placement: Maintained on subcutaneous fondaparineux and aspirin  Plan: 1. At this time, we will likely prep for a possible colonoscopy tomorrow. Did discuss risks, benefits, limitations and alternatives and the patient agrees to proceed. 2. Clear liquid diet was ordered today. 3. Discussed that we will likely order movi-prep for the patient to start this afternoon, after final recs from Dr. Loletha Carrow. 4. Discussed with the patient that she could likely take her Prednisone after eating some Jell-O or something of substance, she is worried to take this medication on an empty stomach. 5. Continue to monitor hemoglobin every 8 hours with transfusion as needed less than 7 6. Continue supportive measures 7. Please await any further recommendations from Dr. Loletha Carrow later today.  Thank you for your kind consultation, we will continue to follow.  Lavone Nian Lemmon  04/27/2017, 10:07 AM Pager #: [phone]   I have reviewed the entire case in detail with the above APP and discussed the plan in detail.  Therefore, I agree with the diagnoses recorded above. In addition,  I have personally interviewed and examined the patient and have personally reviewed any abdominal/pelvic CT scan images.  My additional thoughts are as  follows:  She is stable with a benign abdominal exam. The nature of bleeding is unclear: Diverticular, infectious, inflammatory, ischemic  Hemoglobin has dropped some but is still normal.  Colonoscopy planned for tomorrow. I stressed the importance of the bowel preparation  with her. Procedure time is not yet known, we will determine this tomorrow morning.  Her repeat hemoglobin check tomorrow morning will be sufficient. Every 6 hours hemoglobin and hematocrit are no longer necessary as the patient has stopped bleeding since stopping anticoagulation.  Patient at increased risk for cardiopulmonary complications of procedure due to medical comorbidities and the need to resume anticoagulation soon after the procedure.   [name] Pager [phone]  Mon-Fri 8a-5p [phone] after 5p, weekends, holidays

## 2017-04-28 ENCOUNTER — Encounter (HOSPITAL_COMMUNITY): Payer: Self-pay

## 2017-04-28 ENCOUNTER — Encounter (HOSPITAL_COMMUNITY)
Admission: EM | Disposition: A | Discharge status: Home / Self Care | Payer: Self-pay | Source: Home / Self Care | Attending: Internal Medicine

## 2017-04-28 HISTORY — PX: COLONOSCOPY WITH PROPOFOL: SHX5780

## 2017-04-28 LAB — CBC
HCT: 39.5 % (ref 36.0–46.0)
Hemoglobin: 13.3 g/dL (ref 12.0–15.0)
MCH: 33.8 pg (ref 26.0–34.0)
MCHC: 33.7 g/dL (ref 30.0–36.0)
MCV: 100.3 fL — AB (ref 78.0–100.0)
PLATELETS: 249 10*3/uL (ref 150–400)
RBC: 3.94 MIL/uL (ref 3.87–5.11)
RDW: 13.6 % (ref 11.5–15.5)
WBC: 8.6 10*3/uL (ref 4.0–10.5)

## 2017-04-28 SURGERY — COLONOSCOPY WITH PROPOFOL
Anesthesia: Moderate Sedation

## 2017-04-28 MED ORDER — FENTANYL CITRATE (PF) 100 MCG/2ML IJ SOLN
INTRAMUSCULAR | Status: AC
  Filled 2017-04-28: qty 2

## 2017-04-28 MED ORDER — DIPHENHYDRAMINE HCL 50 MG/ML IJ SOLN
INTRAMUSCULAR | Status: AC
  Filled 2017-04-28: qty 1

## 2017-04-28 MED ORDER — FENTANYL CITRATE (PF) 100 MCG/2ML IJ SOLN
INTRAMUSCULAR | Status: DC | PRN
  Administered 2017-04-28 (×4): 25 ug via INTRAVENOUS

## 2017-04-28 MED ORDER — MIDAZOLAM HCL 5 MG/5ML IJ SOLN
INTRAMUSCULAR | Status: DC | PRN
  Administered 2017-04-28 (×4): 2 mg via INTRAVENOUS

## 2017-04-28 MED ORDER — MIDAZOLAM HCL 5 MG/ML IJ SOLN
INTRAMUSCULAR | Status: AC
  Filled 2017-04-28: qty 2

## 2017-04-28 SURGICAL SUPPLY — 22 items

## 2017-04-28 NOTE — Progress Notes (Signed)
   Subjective:  Patient seen after colonoscopy. Prep was poor, mild patchy inflammation was seen in the mid sigmoid and distal sigmoid colon. Patient is eager to go home. She denies any further hematochezia. No chest pain or dyspnea.  Objective:  Vital signs in last 24 hours: Vitals:   04/28/17 0815 04/28/17 0825 04/28/17 0828 04/28/17 0840  BP: (!) 180/90 (!) 122/38 (!) 122/38 (!) 160/78  Pulse:   75   Resp:   11   Temp:   97.6 F (36.4 C)   TempSrc:   Oral   SpO2:   100%   Weight:      Height:       Physical Exam General: sitting up in the chair, eating breakfast Cardiac: RRR, no rubs, murmurs or gallops Pulm: clear to auscultation bilaterally Abd: soft, nontender, nondistended Ext: warm and well perfused, no pedal edema Neuro: alert and oriented X3   Assessment/Plan:  Principal Problem:   Bleeding per rectum Active Problems:   TAKAYASU'S ARTERITIS   History of CVA (cerebrovascular accident)   Spastic hemiplegia affecting nondominant side (HCC)   S/P IVC filter  # Lower GI Bleed Colonoscopy performed 6/3. Prep was poor, mild patchy inflammation was seen in the mid sigmoid and distal sigmoid colon. GI has recommended resuming home medications including home anticoagulation.  -- Resume diet -- Appreciate GI consultation -- Discharge to home today  # Takayasu's arteritis -- Resume home tocilizumab on discharge -- Continue prednisone 12.5 mg daily  # History of CVA with residual left-sided deficits -- Hold aspirin  DVT/PE prophylaxis: Resume home Fondaparinux  Dispo: Anticipated discharge today.  Zada Finders, MD 04/28/2017, 10:22 AM

## 2017-04-28 NOTE — Discharge Summary (Signed)
Name: [name] MRN: [id number] DOB: [birth year] 41 y.o. PCP: Patient, No Pcp Per  Date of Admission: 04/26/2017 10:19 AM Date of Discharge: 04/29/2017 Attending Physician: No att. providers found  Discharge Diagnosis: 1. Bright red blood per rectum Principal Problem:   Bleeding per rectum Active Problems:   TAKAYASU'S ARTERITIS   History of CVA (cerebrovascular accident)   Spastic hemiplegia affecting nondominant side (HCC)   S/P IVC filter   Discharge Medications: Allergies as of 04/28/2017      Reactions   Pork-derived Products Other (See Comments)   Does not eat pork at all (agreeable to use Lovenox 08/11/13)      Medication List    STOP taking these medications   hydrochlorothiazide 12.5 MG tablet Commonly known as:  HYDRODIURIL   HYDROcodone-acetaminophen 5-325 MG tablet Commonly known as:  NORCO/VICODIN   LORazepam 1 MG tablet Commonly known as:  ATIVAN   megestrol 40 MG tablet Commonly known as:  MEGACE     TAKE these medications   ACTEMRA 400 MG/20ML Soln injection Generic drug:  tocilizumab Inject 400 mg/kg into the vein once a week. Take on Saturday   aspirin 81 MG EC tablet Take 1 tablet (81 mg total) by mouth daily.   Biotin 1000 MCG tablet Take 1,000 mcg by mouth daily.   CALCIUM CARBONATE-VITAMIN D PO Take 600 mg by mouth daily.   folic acid 1 MG tablet Commonly known as:  FOLVITE Take 1 mg by mouth daily.   fondaparinux 7.5 MG/0.6ML Soln injection Commonly known as:  ARIXTRA Inject 7.5 mg into the skin every evening.   Melatonin 5 MG Tabs Take 5 mg by mouth at bedtime.   multivitamin tablet Take 1 tablet by mouth daily.   predniSONE 5 MG tablet Commonly known as:  DELTASONE Take 7.5 mg by mouth daily.   VITAMIN B COMPLEX PO Take 1 tablet by mouth daily. Super B Complex   vitamin C 250 MG tablet Commonly known as:  ASCORBIC ACID Take 250 mg by mouth daily.       Disposition and follow-up:   [name] was  discharged from [facility] in Good condition.  At the hospital follow up visit please address:  1.  Could consider further outpatient GI workup. Please ensure the patient is back on all of her home medications.  2.  Labs / imaging needed at time of follow-up: May need EGD.  3.  Pending labs/ test needing follow-up: See full colonoscopy report. Follow-up on pathology from colonoscopy.  Follow-up Appointments: Follow-up Information    Primary Care. Schedule an appointment as soon as possible for a visit in 2 week(s).   Why:  Please follow up with your primary care team in 1-2 weeks.          Hospital Course by problem list: Principal Problem:   Bleeding per rectum Active Problems:   TAKAYASU'S ARTERITIS   History of CVA (cerebrovascular accident)   Spastic hemiplegia affecting nondominant side (HCC)   S/P IVC filter   1. Bright red blood per rectum The patient is a 41 year old female with a complicated past medical history on chronic prednisone and anticoagulation who presented to the [facility] emergency department on 04/26/2017 with a 72 hour history of bright red blood per rectum. Once admitted the patient's anticoagulation and steroids were stopped and she was placed on nothing by mouth and twice a day PPI. She was seen by gastroenterology who performed a colonoscopy which was limited  by poor bowel preparation but showed mild sigmoidal colonic inflammation without a bleeding source identified. Her history of bright red blood per rectum is concerning for a lower gastrointestinal bleed. Gastroenterology recommended outpatient follow-up and to resume all home medications including anticoagulation. The patient's hemoglobin remained stable while she was in the hospital and she had no episodes of recurrent bloody bowel movements. The exact etiology of her bright red blood per rectum was not elucidated although she appears to be low risk at this time. Please determine the  necessity for ongoing gastroenterology follow-up and potential for EGD given multiple upper GI risk factors such as chronic prednisone use and nonsteroidal anti-inflammatory use. If the pathology from her colonoscopy was normal she will need screening colonoscopy at 48 with aggressive bowel prep.  Discharge Vitals:   BP (!) 160/78   Pulse 75   Temp 97.6 F (36.4 C) (Oral)   Resp 11   Ht 5\' 7"  (1.702 m)   Wt 82 kg (180 lb 11.2 oz)   LMP 04/26/2017 Comment: Pt has IUD in, claims no chance of pregnancy  SpO2 100%   BMI 28.30 kg/m   Pertinent Labs, Studies, and Procedures:  1. Colonoscopy-please see full report in Epic.  Discharge Instructions: Discharge Instructions    Call MD for:  difficulty breathing, headache or visual disturbances    Complete by:  As directed    Call MD for:  extreme fatigue    Complete by:  As directed    Call MD for:  persistant dizziness or light-headedness    Complete by:  As directed    Call MD for:  persistant nausea and vomiting    Complete by:  As directed    Call MD for:  severe uncontrolled pain    Complete by:  As directed    Call MD for:  temperature >100.4    Complete by:  As directed    Diet - low sodium heart healthy    Complete by:  As directed    Discharge instructions    Complete by:  As directed    Monitor for further GI bleeding.   Increase activity slowly    Complete by:  As directed       Signed: Ophelia Shoulder, MD 04/29/2017, 10:01 AM

## 2017-04-28 NOTE — H&P (View-Only) (Signed)
Consultation  Referring Provider: Dr. Daryll Drown     Primary Care Physician:  Patient, No Pcp Per Primary Gastroenterologist:   Althia Forts      Reason for Consultation:  Rectal Bleeding            HPI:   [name] is a 41 y.o. female with a past medical history anxiety, DVT, hypertension, PVD, stroke on ASA and sq Arixira with residual left-sided hemiparalysis, history of DVT status post IVC filter, (07/01/15 LVEF 60-65%), who presented to the ED on 04/26/17 with rectal bleeding.   Today, the patient describes that over the past 2-3 days she has had an increase in frequency of her bowel movements after starting a weight loss supplement (Garcinia cambogia). The patient tells me that she has noticed some bright red blood mixed in with her stool over the past 3 days typically 2-3 times a day. Last night she noticed bright red blood on her sheets after lying down to take a shower. Cleaned up and then awoke again with bright red blood and that she proceed to the ER. The patient's last bloody stool was around 3:00 yesterday afternoon after drinking some chicken brush immediately felt as though she had to go to the bathroom. This was a somewhat darker/clotted stool and abdominal surrounded by bright red blood. Patient does report that her period started a about 5 days ago and has since stopped. Patient does tell me that she is limited feeling in her abdomen due to being post stroke. Says his symptoms do include some nausea yesterday. Associated symptoms include shortness of breath and decreased energy.   Past medical history is positive for using prednisone 12.5 mg daily for Takayasu's arthritis and naproxen 440 mg at night for the past 1-2 months. She also has a history of DVTs for which she had an IVC filter placed on 2014.   Patient denies fever, chills, abdominal pain, and vomiting.  ER course :In the ED, FOBT was positive though Hb was at 15, stable from 3 months ago and above 8-9 from 2017 when she had  heavy menses. PT/INR were reassuring at 14.4 and 1.12, respectively. GI was consulted for further evaluation, and type & screen was ordered.  Previous GI evaluations: None  Past Medical History:  Diagnosis Date  . Anxiety   . Arthritis    Takyasu Arthritis   . Benign tumor of back   . DVT (deep venous thrombosis) ([place])   . Hypertension   . PVD (peripheral vascular disease) ([place])   . Stroke (cerebrum) ([place])   . Stroke [facility])    left sided deficits  . Swelling, lymph nodes 2006   intermittant, benign    Past Surgical History:  Procedure Laterality Date  . BREAST SURGERY     bil breast implants  . ivc filter placement     TrapEase IVC filter Safe 1.5T    Family History  Problem Relation Age of Onset  . Cancer - Other Father   . Coronary artery disease Mother   . Stroke Mother   . Hypertension Mother   . Hypertension Sister     Social History  Substance Use Topics  . Smoking status: Never Smoker  . Smokeless tobacco: Never Used  . Alcohol use No    Prior to Admission medications   Medication Sig Start Date End Date Taking? Authorizing Provider  aspirin EC 81 MG EC tablet Take 1 tablet (81 mg total) by mouth daily. 07/01/15  Yes Tiburcio Pea,  Orson Slick, MD  B Complex Vitamins (VITAMIN B COMPLEX PO) Take 1 tablet by mouth daily. Super B Complex   Yes [provider]  Biotin 1000 MCG tablet Take 1,000 mcg by mouth daily.   Yes [provider]  CALCIUM CARBONATE-VITAMIN D PO Take 600 mg by mouth daily.    Yes [provider]  folic acid (FOLVITE) 1 MG tablet Take 1 mg by mouth daily.   Yes [provider]  fondaparinux (ARIXTRA) 7.5 MG/0.6ML SOLN injection Inject 7.5 mg into the skin every evening. 09/11/15  Yes [provider]  Melatonin 5 MG TABS Take 5 mg by mouth at bedtime.    Yes [provider]  Multiple Vitamin (MULTIVITAMIN) tablet Take 1 tablet by mouth daily.   Yes [provider]  predniSONE (DELTASONE) 5  MG tablet Take 7.5 mg by mouth daily. 04/18/15  Yes [provider]  tocilizumab (ACTEMRA) 400 MG/20ML SOLN injection Inject 400 mg/kg into the vein once a week. Take on Saturday   Yes [provider]  vitamin C (ASCORBIC ACID) 250 MG tablet Take 250 mg by mouth daily.   Yes [provider]  hydrochlorothiazide (HYDRODIURIL) 12.5 MG tablet Take 1 tablet (12.5 mg total) by mouth daily. Patient not taking: Reported on 04/26/2017 04/26/16   Delos Haring, PA-C  HYDROcodone-acetaminophen (NORCO/VICODIN) 5-325 MG tablet Take 1 tablet by mouth every 6 (six) hours as needed for moderate pain. Patient not taking: Reported on 04/26/2017 09/12/15   Rosemarie Ax, MD  LORazepam (ATIVAN) 1 MG tablet Take 1 tablet (1 mg total) by mouth 3 (three) times daily as needed for anxiety. Patient not taking: Reported on 04/26/2017 04/26/16   Delos Haring, PA-C  megestrol (MEGACE) 40 MG tablet Take 40 mg by mouth 2 times daily 5 days, continue 40 mg daily 5 days or until bleeding stops Patient not taking: Reported on 04/26/2017 04/16/16   Merryl Hacker, MD    Current Facility-Administered Medications  Medication Dose Route Frequency Provider Last Rate Last Dose  . folic acid (FOLVITE) tablet 1 mg  1 mg Oral Daily Patel, Rushil V, MD      . pantoprazole (PROTONIX) injection 40 mg  40 mg Intravenous Q12H Zada Finders, MD      . predniSONE (DELTASONE) tablet 12.5 mg  12.5 mg Oral Q breakfast Riccardo Dubin, MD        Allergies as of 04/26/2017 - Review Complete 04/26/2017  Allergen Reaction Noted  . Pork-derived products Other (See Comments) 02/23/2013     Review of Systems:    Constitutional: No weight loss, fever or chills Skin: No rash Cardiovascular: No chest pain Respiratory: Positive for some SOB Gastrointestinal: See HPI and otherwise negative Genitourinary: No dysuria  Neurological: No headache Musculoskeletal: No new muscle or joint pain Hematologic: No  bruising Psychiatric: No history of depression or anxiety Remainder systems negative except as above in history  Physical Exam:  Vital signs in last 24 hours: Temp:  [97.8 F (36.6 C)-98.4 F (36.9 C)] 97.8 F (36.6 C) (06/02 0530) Pulse Rate:  [77-90] 77 (06/02 0530) Resp:  [16-18] 18 (06/02 0530) BP: (128-161)/(64-99) 143/85 (06/02 0530) SpO2:  [96 %-100 %] 100 % (06/02 0530) Weight:  [180 lb 11.2 oz (82 kg)] 180 lb 11.2 oz (82 kg) (06/01 1707) Last BM Date: 04/26/17 General:   Pleasant African American female appears to be in NAD, Well developed, Well nourished, alert and cooperative Head:  Normocephalic and atraumatic. Eyes:  PEERL, EOMI. No icterus. Conjunctiva pink. Ears:  Normal auditory acuity. Neck:  Supple Throat: Oral cavity and pharynx without inflammation, swelling or lesion. Teeth in good condition. Lungs: Respirations even and unlabored. Lungs clear to auscultation bilaterally.   No wheezes, crackles, or rhonchi.  Heart: Normal S1, S2. No MRG. Regular rate and rhythm. No peripheral edema, cyanosis or pallor.  Abdomen:  Soft, nondistended, nontender. No rebound or guarding. Normal bowel sounds. No appreciable masses or hepatomegaly. Rectal:  External: Bright red blood residue on underwear and buttocks; Internal exam: no stool or blood.  Msk:  Symmetrical without gross deformities. Peripheral pulses intact. Uses cane to ambulate Extremities:  Without edema, no deformity or joint abnormality.  Neurologic:  Alert and  oriented x4;  grossly normal neurologically. Left sided hemiparesis Skin:   Dry and intact without significant lesions or rashes. Psychiatric:  Demonstrates good judgement and reason without abnormal affect or behaviors.   LAB RESULTS:  Recent Labs  04/26/17 1035 04/27/17 0552  WBC 10.3 9.0  HGB 15.1* 13.0  HCT 43.3 38.7  PLT 226 216   BMET  Recent Labs  04/26/17 1035  NA 142  K 3.7  CL 108  CO2 24  GLUCOSE 116*  BUN 8  CREATININE 0.89   CALCIUM 8.8*   LFT  Recent Labs  04/26/17 1035  PROT 6.2*  ALBUMIN 3.9  AST 22  ALT 18  ALKPHOS 45  BILITOT 0.8   PT/INR  Recent Labs  04/26/17 1035  LABPROT 14.4  INR 1.12    PREVIOUS ENDOSCOPIES:            None   Impression / Plan:   Impression: 1. Rectal Bleeding: Patient reports this started 2-3 days ago, occurs 2-3 times a day, has slowed some since being here with her last stool yesterday afternoon around 3 or 4, none since, drop in hemoglobin from 15.1-13.0 overnight, patient on chronic anticoagulation due to IVC filter placement, this has been held, last taken Thursday morning; likely lower GI bleed, most likely diverticular 2. Status post IVC filter placement: Maintained on subcutaneous fondaparineux and aspirin  Plan: 1. At this time, we will likely prep for a possible colonoscopy tomorrow. Did discuss risks, benefits, limitations and alternatives and the patient agrees to proceed. 2. Clear liquid diet was ordered today. 3. Discussed that we will likely order movi-prep for the patient to start this afternoon, after final recs from Dr. Loletha Carrow. 4. Discussed with the patient that she could likely take her Prednisone after eating some Jell-O or something of substance, she is worried to take this medication on an empty stomach. 5. Continue to monitor hemoglobin every 8 hours with transfusion as needed less than 7 6. Continue supportive measures 7. Please await any further recommendations from Dr. Loletha Carrow later today.  Thank you for your kind consultation, we will continue to follow.  Lavone Nian Lemmon  04/27/2017, 10:07 AM Pager #: [phone]   I have reviewed the entire case in detail with the above APP and discussed the plan in detail.  Therefore, I agree with the diagnoses recorded above. In addition,  I have personally interviewed and examined the patient and have personally reviewed any abdominal/pelvic CT scan images.  My additional thoughts are as  follows:  She is stable with a benign abdominal exam. The nature of bleeding is unclear: Diverticular, infectious, inflammatory, ischemic  Hemoglobin has dropped some but is still normal.  Colonoscopy planned for tomorrow. I stressed the importance of the bowel preparation  with her. Procedure time is not yet known, we will determine this tomorrow morning.  Her repeat hemoglobin check tomorrow morning will be sufficient. Every 6 hours hemoglobin and hematocrit are no longer necessary as the patient has stopped bleeding since stopping anticoagulation.  Patient at increased risk for cardiopulmonary complications of procedure due to medical comorbidities and the need to resume anticoagulation soon after the procedure.   [name] Pager [phone]  Mon-Fri 8a-5p [phone] after 5p, weekends, holidays

## 2017-04-28 NOTE — Op Note (Signed)
[facility] Patient Name: [name] Procedure Date : 04/28/2017 MRN: [id number] Attending MD: Estill Cotta. Loletha Carrow , MD Date of Birth: 1976/01/14 CSN: [id number] Age: 41 Admit Type: Inpatient Procedure:                Colonoscopy Indications:              Hematochezia (hemoglobin remains normal) Providers:                Estill Cotta. Loletha Carrow, MD, Laverta Baltimore RN, RN,                            William Dalton, Technician Referring MD:              Medicines:                Fentanyl 100 micrograms IV, Midazolam 8 mg IV,                            Sedation Administered by an Endoscopy Nurse Complications:            No immediate complications. Estimated Blood Loss:     Estimated blood loss: none. Procedure:                Pre-Anesthesia Assessment:                           - Prior to the procedure, a History and Physical                            was performed, and patient medications and                            allergies were reviewed. The patient's tolerance of                            previous anesthesia was also reviewed. The risks                            and benefits of the procedure and the sedation                            options and risks were discussed with the patient.                            All questions were answered, and informed consent                            was obtained. Prior Anticoagulants: The patient                            last took aspirin 1 day and anticoagulant                            medication (fondiparinux) 3 days prior to the  procedure. [ASA Grade]. After reviewing the risks                            and benefits, the patient was deemed in                            satisfactory condition to undergo the procedure.                           After obtaining informed consent, the colonoscope                            was passed under direct vision. Throughout the   procedure, the patient's blood pressure, pulse, and                            oxygen saturations were monitored continuously. The                            EC-[id number] ([id number]) scope was introduced through                            the anus with the intention of advancing to the                            cecum. The scope was advanced to the sigmoid colon                            before the procedure was aborted. Medications were                            given. The colonoscopy was performed with moderate                            difficulty due to inadequate bowel prep and the                            patient's inability to cooperate. The patient                            tolerated the procedure poorly due to the patient's                            discomfort during the procedure. The quality of the                            bowel preparation was poor. The bowel preparation                            used was MoviPrep. The rectum was photographed. Scope In: 8:07:37 AM Scope Out: 8:17:44 AM Total Procedure Duration: 0 hours 10 minutes 7 seconds  Findings:      The perianal and digital rectal examinations were normal.      Patchy mild  inflammation characterized by granularity, loss of       vascularity and shallow ulcerations was found in the mid sigmoid colon       and in the distal sigmoid colon.      Retroflexion in the rectum was not performed due to patient discomfort.      see above - exam incomplete, but sufficient to determine the cause of       bleeding. Impression:               - Preparation of the colon was poor.                           - Patchy mild inflammation was found in the mid                            sigmoid colon and in the distal sigmoid colon                            secondary to ischemic colitis.                           - No specimens collected. Moderate Sedation:      Moderate (conscious) sedation was administered by the endoscopy nurse        and supervised by the endoscopist. The following parameters were       monitored: oxygen saturation, heart rate, blood pressure, respiratory       rate, EKG, adequacy of pulmonary ventilation, and response to care.       Total physician intraservice time was 16 minutes. Recommendation:           - Resume regular diet.                           - Patient can resume anticoagulation and be                            discharged home today.                           - Continue present medications.                           Patient requires more extensive bowel preparation                            as well as deep sedation with propofol for any                            future colonoscopy.                           - If the pathology report is benign, then repeat                            colonoscopy for screening purposes age 27. Procedure Code(s):        --- Professional ---  16109, 53, Colonoscopy, flexible; diagnostic,                            including collection of specimen(s) by brushing or                            washing, when performed (separate procedure)                           99152, Moderate sedation services provided by the                            same physician or other qualified health care                            professional performing the diagnostic or                            therapeutic service that the sedation supports,                            requiring the presence of an independent trained                            observer to assist in the monitoring of the                            patient's level of consciousness and physiological                            status; initial 15 minutes of intraservice time,                            patient age 1 years or older Diagnosis Code(s):        --- Professional ---                           K55.9, Vascular disorder of intestine, unspecified                           K92.1,  Melena (includes Hematochezia) CPT copyright 2016 American Medical [employer]. All rights reserved. The codes documented in this report are preliminary and upon coder review may  be revised to meet current compliance requirements. [name], MD 04/28/2017 8:33:46 AM This report has been signed electronically. Number of Addenda: 0

## 2017-04-28 NOTE — Progress Notes (Signed)
  Date: 04/28/2017  Patient name: [name]  Medical record number: [id number]  Date of birth: 1976/02/13   This patient's plan of care was discussed with the house staff. Please see their note for complete details. I concur with their findings.   Sid Falcon, MD 04/28/2017, 1:34 PM

## 2017-04-28 NOTE — Discharge Instructions (Signed)
The Colonoscopy showed some inflammation in your lower colon which was likely the cause of your bleeding. You may resume your current home medications. Please follow up with your primary medical team in the next 1-2 weeks. Please seek medical attention if significant bleeding returns or you have symptoms of lightheadedness, dizziness, fatigue, fevers, shortness of breath, or other significant change.   Lower Gastrointestinal Bleeding Lower gastrointestinal (GI) bleeding is the result of bleeding from the colon, rectum, or anal area. The colon is the last part of the digestive tract, where stool, also called feces, is formed. If you have lower GI bleeding, you may see blood in or on your stool. It may be bright red. Lower GI bleeding often stops without treatment. Continued or heavy bleeding needs emergency treatment at the hospital. What are the causes? Lower GI bleeding may be caused by:  A condition that causes pouches to form in the colon over time (diverticulosis).  Swelling and irritation (inflammation) in areas with diverticulosis (diverticulitis).  Inflammation of the colon (inflammatory bowel disease).  Swollen veins in the rectum (hemorrhoids).  Painful tears in the anus (anal fissures), often caused by passing hard stools.  Cancer of the colon or rectum.  Noncancerous growths (polyps) of the colon or rectum.  A bleeding disorder that impairs the formation of blood clots and causes easy bleeding (coagulopathy).  An abnormal weakening of a blood vessel where an artery and a vein come together (arteriovenous malformation).  What increases the risk? You are more likely to develop this condition if:  You are older than 43 years of age.  You take aspirin or NSAIDs on a regular basis.  You take anticoagulant or antiplatelet drugs.  You have a history of high-dose X-ray treatment (radiation therapy) of the colon.  You recently had a colon polyp removed.  What are the signs  or symptoms? Symptoms of this condition include:  Bright red blood or blood clots coming from your rectum.  Bloody stools.  Black or maroon-colored stools.  Pain or cramping in the abdomen.  Weakness or dizziness.  Racing heartbeat.  How is this diagnosed? This condition may be diagnosed based on:  Your symptoms and medical history.  A physical exam. During the exam, your health care provider will check for signs of blood loss, such as low blood pressure and a rapid pulse.  Tests, such as: ? Flexible sigmoidoscopy. In this procedure, a flexible tube with a camera on the end is used to examine your anus and the first part of your colon to look for the source of bleeding. ? Colonoscopy. This is similar to a flexible sigmoidoscopy, but the camera can extend all the way to the uppermost part of your colon. ? Blood tests to measure your red blood cell count and to check for coagulopathy. ? An imaging study of your colon to look for a bleeding site. In some cases, you may have X-rays taken after a dye or radioactive substance is injected into your bloodstream (angiogram).  How is this treated? Treatment for this condition depends on the cause of the bleeding. Heavy or persistent bleeding is treated at the hospital. Treatment may include:  Getting fluids through an IV tube inserted into one of your veins.  Getting blood through an IV tube (blood transfusion).  Stopping bleeding through high-heat coagulation, injections of certain medicines, or applying surgical clips. This can all be done during a colonoscopy.  Having a procedure that involves first doing an angiogram and then blocking blood  flow to the bleeding site (embolization).  Stopping some of your regular medicines for a certain amount of time.  Having surgery to remove part of the colon. This may be needed if bleeding is severe and does not respond to other treatment.  Follow these instructions at home:  Take  over-the-counter and prescription medicines only as told by your health care provider. You may need to avoid aspirin, NSAIDs, or other medicines that increase bleeding.  Eat foods that are high in fiber. This will help keep your stools soft. These foods include whole grains, legumes, fruits, and vegetables. Eating 1-3 prunes each day works well for many people.  Drink enough fluid to keep your urine clear or pale yellow.  Keep all follow-up visits as told by your health care provider. This is important. Contact a health care provider if:  Your symptoms do not improve. Get help right away if:  Your bleeding increases.  You feel light-headed or you faint.  You feel weak.  You have severe cramps in your back or abdomen.  You pass large blood clots in your stool.  Your symptoms get worse. This information is not intended to replace advice given to you by your health care provider. Make sure you discuss any questions you have with your health care provider. Document Released: 03/29/2016 Document Revised: 04/19/2016 Document Reviewed: 03/29/2016 Elsevier Interactive Patient Education  [employer].

## 2017-04-28 NOTE — Progress Notes (Signed)
Patient was discharged home by MD order; discharged instructions review and give to patient with care notes; IV DIC;  patient will be escorted to the car by nurse tech via wheelchair.  

## 2017-04-28 NOTE — Interval H&P Note (Signed)
History and Physical Interval Note:  04/28/2017 7:51 AM  [name]  has presented today for surgery, with the diagnosis of Rectal Bleeding  The various methods of treatment have been discussed with the patient and family. After consideration of risks, benefits and other options for treatment, the patient has consented to  Procedure(s): COLONOSCOPY WITH PROPOFOL (N/A) as a surgical intervention .  The patient's history has been reviewed, patient examined, no change in status, stable for surgery.  I have reviewed the patient's chart and labs.  Questions were answered to the patient's satisfaction.     [name]

## 2017-05-02 ENCOUNTER — Observation Stay (HOSPITAL_COMMUNITY)
Admission: EM | Admit: 2017-05-02 | Discharge: 2017-05-03 | Disposition: A | Discharge status: Home / Self Care | Payer: 59 | Attending: Student in an Organized Health Care Education/Training Program | Admitting: Student in an Organized Health Care Education/Training Program

## 2017-05-02 ENCOUNTER — Encounter (HOSPITAL_COMMUNITY): Payer: Self-pay | Admitting: Emergency Medicine

## 2017-05-02 DIAGNOSIS — Z7952 Long term (current) use of systemic steroids: Secondary | ICD-10-CM

## 2017-05-02 DIAGNOSIS — K625 Hemorrhage of anus and rectum: Secondary | ICD-10-CM | POA: Insufficient documentation

## 2017-05-02 DIAGNOSIS — Z8249 Family history of ischemic heart disease and other diseases of the circulatory system: Secondary | ICD-10-CM

## 2017-05-02 DIAGNOSIS — Z823 Family history of stroke: Secondary | ICD-10-CM

## 2017-05-02 DIAGNOSIS — Z79899 Other long term (current) drug therapy: Secondary | ICD-10-CM | POA: Insufficient documentation

## 2017-05-02 DIAGNOSIS — G8110 Spastic hemiplegia affecting unspecified side: Secondary | ICD-10-CM | POA: Diagnosis present

## 2017-05-02 DIAGNOSIS — Z8673 Personal history of transient ischemic attack (TIA), and cerebral infarction without residual deficits: Secondary | ICD-10-CM | POA: Diagnosis not present

## 2017-05-02 DIAGNOSIS — Z7982 Long term (current) use of aspirin: Secondary | ICD-10-CM | POA: Insufficient documentation

## 2017-05-02 DIAGNOSIS — M314 Aortic arch syndrome [Takayasu]: Secondary | ICD-10-CM | POA: Diagnosis present

## 2017-05-02 DIAGNOSIS — K922 Gastrointestinal hemorrhage, unspecified: Principal | ICD-10-CM | POA: Diagnosis present

## 2017-05-02 DIAGNOSIS — Z7901 Long term (current) use of anticoagulants: Secondary | ICD-10-CM | POA: Diagnosis not present

## 2017-05-02 DIAGNOSIS — Z809 Family history of malignant neoplasm, unspecified: Secondary | ICD-10-CM

## 2017-05-02 DIAGNOSIS — K2970 Gastritis, unspecified, without bleeding: Secondary | ICD-10-CM | POA: Insufficient documentation

## 2017-05-02 DIAGNOSIS — Z95828 Presence of other vascular implants and grafts: Secondary | ICD-10-CM

## 2017-05-02 DIAGNOSIS — D689 Coagulation defect, unspecified: Secondary | ICD-10-CM | POA: Diagnosis present

## 2017-05-02 DIAGNOSIS — I10 Essential (primary) hypertension: Secondary | ICD-10-CM | POA: Diagnosis present

## 2017-05-02 DIAGNOSIS — Z86718 Personal history of other venous thrombosis and embolism: Secondary | ICD-10-CM | POA: Diagnosis not present

## 2017-05-02 DIAGNOSIS — E785 Hyperlipidemia, unspecified: Secondary | ICD-10-CM | POA: Diagnosis present

## 2017-05-02 DIAGNOSIS — I69354 Hemiplegia and hemiparesis following cerebral infarction affecting left non-dominant side: Secondary | ICD-10-CM | POA: Diagnosis not present

## 2017-05-02 HISTORY — DX: Gastrointestinal hemorrhage, unspecified: K92.2

## 2017-05-02 LAB — TYPE AND SCREEN
ABO/RH(D): B POS
ANTIBODY SCREEN: NEGATIVE

## 2017-05-02 LAB — COMPREHENSIVE METABOLIC PANEL
ALT: 20 U/L (ref 14–54)
AST: 19 U/L (ref 15–41)
Albumin: 3.9 g/dL (ref 3.5–5.0)
Alkaline Phosphatase: 46 U/L (ref 38–126)
Anion gap: 9 (ref 5–15)
BILIRUBIN TOTAL: 0.5 mg/dL (ref 0.3–1.2)
CO2: 24 mmol/L (ref 22–32)
CREATININE: 0.95 mg/dL (ref 0.44–1.00)
Calcium: 9.3 mg/dL (ref 8.9–10.3)
Chloride: 107 mmol/L (ref 101–111)
Glucose, Bld: 96 mg/dL (ref 65–99)
POTASSIUM: 3.2 mmol/L — AB (ref 3.5–5.1)
Sodium: 140 mmol/L (ref 135–145)
TOTAL PROTEIN: 6.2 g/dL — AB (ref 6.5–8.1)

## 2017-05-02 LAB — CBC
HEMATOCRIT: 37.6 % (ref 36.0–46.0)
Hemoglobin: 12.6 g/dL (ref 12.0–15.0)
MCH: 33.6 pg (ref 26.0–34.0)
MCHC: 33.5 g/dL (ref 30.0–36.0)
MCV: 100.3 fL — ABNORMAL HIGH (ref 78.0–100.0)
PLATELETS: 285 10*3/uL (ref 150–400)
RBC: 3.75 MIL/uL — AB (ref 3.87–5.11)
RDW: 14 % (ref 11.5–15.5)
WBC: 11.4 10*3/uL — AB (ref 4.0–10.5)

## 2017-05-02 LAB — LACTIC ACID, PLASMA: LACTIC ACID, VENOUS: 1.2 mmol/L (ref 0.5–1.9)

## 2017-05-02 MED ORDER — ACETAMINOPHEN 650 MG RE SUPP: 650.0000 mg | Freq: Four times a day (QID) | RECTAL | Status: DC | PRN

## 2017-05-02 MED ORDER — BIOTIN 1000 MCG PO TABS: 1000.0000 ug | ORAL_TABLET | Freq: Every day | ORAL | Status: DC

## 2017-05-02 MED ORDER — FOLIC ACID 1 MG PO TABS
1.0000 mg | ORAL_TABLET | Freq: Every day | ORAL | Status: DC
Start: 2017-05-02 — End: 2017-05-03
  Administered 2017-05-02 – 2017-05-03 (×2): 1 mg via ORAL
  Filled 2017-05-02 (×2): qty 1

## 2017-05-02 MED ORDER — MELATONIN 3 MG PO TABS
3.0000 mg | ORAL_TABLET | Freq: Every evening | ORAL | Status: DC | PRN
  Administered 2017-05-02: 3 mg via ORAL
  Filled 2017-05-02: qty 1

## 2017-05-02 MED ORDER — ACETAMINOPHEN 325 MG PO TABS
650.0000 mg | ORAL_TABLET | Freq: Four times a day (QID) | ORAL | Status: DC | PRN
  Administered 2017-05-02: 650 mg via ORAL
  Filled 2017-05-02: qty 2

## 2017-05-02 MED ORDER — FONDAPARINUX SODIUM 7.5 MG/0.6ML ~~LOC~~ SOLN
7.5000 mg | Freq: Every evening | SUBCUTANEOUS | Status: DC
  Administered 2017-05-02: 7.5 mg via SUBCUTANEOUS
  Filled 2017-05-02: qty 0.6

## 2017-05-02 MED ORDER — VITAMIN C 500 MG PO TABS
250.0000 mg | ORAL_TABLET | Freq: Every day | ORAL | Status: DC
  Administered 2017-05-02 – 2017-05-03 (×2): 250 mg via ORAL
  Filled 2017-05-02 (×2): qty 1

## 2017-05-02 MED ORDER — POTASSIUM CHLORIDE CRYS ER 20 MEQ PO TBCR
40.0000 meq | EXTENDED_RELEASE_TABLET | Freq: Once | ORAL | Status: AC
  Administered 2017-05-02: 40 meq via ORAL
  Filled 2017-05-02: qty 2

## 2017-05-02 MED ORDER — CALCIUM CARBONATE-VITAMIN D 500-200 MG-UNIT PO TABS
1.0000 | ORAL_TABLET | Freq: Every day | ORAL | Status: DC
  Administered 2017-05-02 – 2017-05-03 (×2): 1 via ORAL
  Filled 2017-05-02 (×2): qty 1

## 2017-05-02 MED ORDER — PANTOPRAZOLE SODIUM 40 MG IV SOLR
40.0000 mg | Freq: Two times a day (BID) | INTRAVENOUS | Status: DC
  Administered 2017-05-02 – 2017-05-03 (×3): 40 mg via INTRAVENOUS
  Filled 2017-05-02 (×3): qty 40

## 2017-05-02 MED ORDER — PREDNISONE 20 MG PO TABS
20.0000 mg | ORAL_TABLET | Freq: Every day | ORAL | Status: DC
Start: 2017-05-02 — End: 2017-05-03
  Administered 2017-05-02 – 2017-05-03 (×2): 20 mg via ORAL
  Filled 2017-05-02 (×2): qty 1

## 2017-05-02 MED ORDER — ASPIRIN EC 81 MG PO TBEC
81.0000 mg | DELAYED_RELEASE_TABLET | Freq: Every day | ORAL | Status: DC
  Administered 2017-05-02 – 2017-05-03 (×2): 81 mg via ORAL
  Filled 2017-05-02 (×2): qty 1

## 2017-05-02 NOTE — Progress Notes (Signed)
Patient trasfered from ED to [phone] via stretcher; alert and oriented x 4; no complaints of pain; IV saline locked in RFA. Orient patient to room and unit;  gave patient care guide; instructed how to use the call bell and  fall risk precautions. Will continue to monitor the patient.

## 2017-05-02 NOTE — ED Triage Notes (Signed)
Pt to ED from home c/o recurrent GI bleeding that started this evening when patient got out of the shower. Pt was just admitted for same and d/c on Sunday. 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 Last blood thinner injection 1700 today (takes every day). Pt denies abd pain/N/V/fevers. Resp e/u, skin warm/dry.

## 2017-05-02 NOTE — ED Notes (Addendum)
Ptc/o GI bleeding onset today, recently admitted and discharged on Sunday for the same. On arrival to room, pt requesting to have a bedside commode "quickly" and "many warm blankets." Pt refusing to get out of the wheelchair until a bedside commode was placed at bedside. Hx of a stroke with L sided deficits, assisted pt to bedside commode, bright red stool and blood noted in brief. Pt refusing to be connected to the cardiac monitor at this time.

## 2017-05-02 NOTE — ED Provider Notes (Signed)
[facility] DEPT Provider Note   CSN: [id number] Arrival date & time: 05/02/17  0043     History   Chief Complaint Chief Complaint  Patient presents with  . GI Bleeding    HPI [name] is a 41 y.o. female.  Patient with a history of CVA (residual left paralysis), HTN, DVT, GI bleed, coagulopathy presents with recurrent GI bleeding that started last night. No abdominal pain, nausea, lightheadedness or syncope. She reports that rectal bleeding appears bright red with clot, and has increased in quantity. She states she had a colonoscopy while hospitalized last week for same with finding of an ulcer on her colon. The bleeding stopped and she was discharged home without further intervention.   The history is provided by the patient. No language interpreter was used.    Past Medical History:  Diagnosis Date  . Anxiety   . Arthritis    Takyasu Arthritis   . Benign tumor of back   . DVT (deep venous thrombosis) ([place])   . Hypertension   . PVD (peripheral vascular disease) ([place])   . Stroke (cerebrum) ([place])   . Stroke [facility])    left sided deficits  . Swelling, lymph nodes 2006   intermittant, benign    Patient Active Problem List   Diagnosis Date Noted  . Bleeding per rectum 04/26/2017  . S/P IVC filter 04/26/2017  . HLD (hyperlipidemia)   . Spastic hemiplegia affecting nondominant side ([place]) 09/10/2013  . Cognitive deficits, late effect of cerebrovascular disease 09/10/2013  . 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 Postprocedural respiratory failure ([place]) 02/16/2013  . Essential hypertension, malignant 02/16/2013  . Sinus tachycardia 02/16/2013  . History of CVA (cerebrovascular accident) 12/05/2012  . Subtherapeutic international normalized ratio (INR) 12/05/2012  . DEPRESSION 09/04/2006  . 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 CONSTIPATION 09/04/2006  . OVARIAN CYST 09/04/2006  . FOOT DROP, LEFT 09/04/2006    Past Surgical History:  Procedure Laterality Date  .  BREAST SURGERY     bil breast implants  . COLONOSCOPY WITH PROPOFOL N/A 04/28/2017   Procedure: COLONOSCOPY WITH PROPOFOL;  Surgeon: Doran Stabler, MD;  Location: [facility];  Service: Endoscopy;  Laterality: N/A;  . ivc filter placement     TrapEase IVC filter Safe 1.5T    OB History    Gravida Para Term Preterm AB Living   1         1   SAB TAB Ectopic Multiple Live Births                   Home Medications    Prior to Admission medications   Medication Sig Start Date End Date Taking? Authorizing Provider  aspirin EC 81 MG EC tablet Take 1 tablet (81 mg total) by mouth daily. 07/01/15   Burgess Estelle, MD  B Complex Vitamins (VITAMIN B COMPLEX PO) Take 1 tablet by mouth daily. Super B Complex    [provider]  Biotin 1000 MCG tablet Take 1,000 mcg by mouth daily.    [provider]  CALCIUM CARBONATE-VITAMIN D PO Take 600 mg by mouth daily.     [provider]  folic acid (FOLVITE) 1 MG tablet Take 1 mg by mouth daily.    [provider]  fondaparinux (ARIXTRA) 7.5 MG/0.6ML SOLN injection Inject 7.5 mg into the skin every evening. 09/11/15   [provider]  Melatonin 5 MG TABS Take 5 mg by mouth at bedtime.     [provider]  Multiple Vitamin (MULTIVITAMIN) tablet Take 1 tablet by mouth daily.    [provider]  predniSONE (DELTASONE) 5 MG tablet Take 7.5 mg by mouth daily. 04/18/15   [provider]  tocilizumab (ACTEMRA) 400 MG/20ML SOLN injection Inject 400 mg/kg into the vein once a week. Take on Saturday    [provider]  vitamin C (ASCORBIC ACID) 250 MG tablet Take 250 mg by mouth daily.    [provider]    Family History Family History  Problem Relation Age of Onset  . Cancer - Other Father   . Coronary artery disease Mother   . Stroke Mother   . Hypertension Mother   . Hypertension Sister     Social History Social History  Substance Use Topics  . Smoking  status: Never Smoker  . Smokeless tobacco: Never Used  . Alcohol use No     Allergies   Pork-derived products   Review of Systems Review of Systems  Constitutional: Negative for chills, fatigue and fever.  HENT: Negative.   Respiratory: Negative.   Cardiovascular: Negative.   Gastrointestinal: Positive for blood in stool. Negative for abdominal pain, nausea and vomiting.  Genitourinary: Negative.   Musculoskeletal: Negative.   Skin: Negative.   Neurological: Negative.  Negative for weakness and light-headedness.     Physical Exam Updated Vital Signs BP (!) 164/93 (BP Location: Right Arm)   Pulse 71   Temp 98.8 F (37.1 C) (Oral)   Resp 20   LMP 04/26/2017 Comment: Pt has IUD in, claims no chance of pregnancy  SpO2 100%   Physical Exam  Constitutional: She is oriented to person, place, and time. She appears well-developed and well-nourished.  HENT:  Head: Normocephalic.  Neck: Normal range of motion. Neck supple.  Cardiovascular: Normal rate and regular rhythm.   Pulmonary/Chest: Effort normal and breath sounds normal.  Abdominal: Soft. Bowel sounds are normal. There is no tenderness. There is no rebound and no guarding.  Genitourinary:  Genitourinary Comments: Frank blood at rectum.  Musculoskeletal: Normal range of motion.  Left arm paralyzed.  Neurological: She is alert and oriented to person, place, and time.  Skin: Skin is warm and dry. No rash noted.  Psychiatric: She has a normal mood and affect.     ED Treatments / Results  Labs (all labs ordered are listed, but only abnormal results are displayed) Labs Reviewed  COMPREHENSIVE METABOLIC PANEL - Abnormal; Notable for the following:       Result Value   Potassium 3.2 (*)    BUN <5 (*)    Total Protein 6.2 (*)    All other components within normal limits  CBC - Abnormal; Notable for the following:    WBC 11.4 (*)    RBC 3.75 (*)    MCV 100.3 (*)    All other components within normal limits  POC  OCCULT BLOOD, ED  TYPE AND SCREEN    EKG  EKG Interpretation None       Radiology No results found.  Procedures Procedures (including critical care time)  Medications Ordered in ED Medications - No data to display   Initial Impression / Assessment and Plan / ED Course  I have reviewed the triage vital signs and the nursing notes.  Pertinent labs & imaging results that were available during my care of the patient were reviewed by me and considered in my medical decision making (see chart for details).     Patient returns after admission for  lower GI bleeding last week, with recurrent bleed last night. No pain, syncope, lightheadedness or palpitations.   She was discharged home and instructed to restart her anticoagulant medications.   She appears stable. Labs do not reveal any significant abnormality. No anemia.   She will be re-admitted for further management of active bleeding and coagulopathy.  Final Clinical Impressions(s) / ED Diagnoses   Final diagnoses:  Acute GI bleeding  Coagulopathy [facility])    New Prescriptions New Prescriptions   No medications on file     Charlann Lange, Hershal Coria 05/02/17 [id number]    Veryl Speak, MD 05/02/17 [phone]

## 2017-05-02 NOTE — ED Notes (Signed)
Patient called EMT over to let her know that "I can feel the blood pooling up inside of me".  EMT offered to take patient to the bathroom, patient refused.  States she would like a bedside commode waiting for her at the bedside upon rooming.

## 2017-05-02 NOTE — H&P (Signed)
Date: 05/02/2017               Patient Name:  [name] MRN: [id number]  DOB: May 02, 1976 Age / Sex: 41 y.o., female   PCP: [facility], [facility] Service: Internal Medicine Teaching Service         Attending Physician: Dr. Lalla Brothers    First Contact: Dr. Ophelia Shoulder Pager: [phone]  Second Contact: Dr. Zada Finders Pager: [phone]       After Hours (After 5p/  First Contact Pager: [phone]  weekends / holidays): Second Contact Pager: [phone]   Chief Complaint: Hematochezia  History of Present Illness: [name] is a 41 y.o. 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 Last night after taking a shower she had a large volume bright red bowel movement with clots, no melena. She had one additional smaller volume bloody BM in the ED overnight. She denies NSAID use, dizziness, nausea, or emesis. She denies abdominal pain but states that she cannot feel her abdomen since her stroke. She had been feeling fine since leaving the hospital four days ago, and had normal BMs the day after discharge. 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 She resumed her blood thinners on discharge. She follows with a [facility] rheumatologist and has taken prednisone chronically. Her dose was recently reduced to 12.5 mg from 20 mg two to three weeks ago. Her last flare was in March 2017 and her typical flare symptoms are chest/back/arm pain, none of which she has experienced recently..   In the ED she afebrile, HR 80, RR 20, BP 168/104, SpO2 99% on room air. Her labs were remarkable for Hb stable at 12.6 from 13.3 earlier this week, and a BMP with K 3.2. IMTS was contacted for  admission.  Meds:  Current Meds  Medication Sig  . aspirin EC 81 MG EC tablet Take 1 tablet (81 mg total) by mouth daily.  . B Complex Vitamins (VITAMIN B COMPLEX PO) Take 1 tablet by mouth daily. Super B Complex  . Biotin 1000 MCG tablet Take 1,000 mcg by mouth daily.  Marland Kitchen CALCIUM CARBONATE-VITAMIN D PO Take 600 mg by mouth daily.   . folic acid (FOLVITE) 1 MG tablet Take 1 mg by mouth daily.  . fondaparinux (ARIXTRA) 7.5 MG/0.6ML SOLN injection Inject 7.5 mg into the skin every evening.  . Melatonin 5 MG TABS Take 5 mg by mouth at bedtime.   . Multiple Vitamin (MULTIVITAMIN) tablet Take 1 tablet by mouth daily.  . predniSONE (DELTASONE) 5 MG tablet Take 7.5 mg by mouth daily.  . vitamin C (ASCORBIC ACID) 250 MG tablet Take 250 mg by mouth daily.  Tocilizumab (Actemra) 400mg  injection once weekly (IL-6 antag)  Allergies: Allergies as of 05/02/2017 - Review Complete 05/02/2017  Allergen Reaction Noted  . Pork-derived products Other (See Comments) 02/23/2013   Past Medical History:  Diagnosis Date  . Anxiety   . Arthritis    Takyasu Arthritis   . Benign tumor of back   . DVT (deep venous thrombosis) ([place])   . Hypertension   . PVD (peripheral vascular disease) ([place])   . Stroke (cerebrum) ([place])   . Stroke [facility])  left sided deficits  . Swelling, lymph nodes 2006   intermittant, benign   Family History:  Family History  Problem Relation Age of Onset  . Cancer - Other Father   . Coronary artery disease Mother   . Stroke Mother   . Hypertension Mother   . Hypertension Sister    Social History:  Social History   Social History  . Marital status: Married    Spouse name: N/A  . Number of children: N/A  . Years of education: N/A   Occupational History  . Not on file.   Social History Main Topics  . Smoking status: Never Smoker  . Smokeless tobacco: Never Used  . Alcohol use No  . Drug use: No  . Sexual activity: Not on file   Other Topics Concern  . Not on file    Social History Narrative   Lives in [place] with husband. Independent in ADLs/IADLs.    Review of Systems: A complete ROS was negative except as per HPI.   Physical Exam: Blood pressure (!) 164/93, pulse 71, temperature 98.8 F (37.1 C), temperature source Oral, resp. rate 20, last menstrual period 04/26/2017, SpO2 100 %.  General appearance: Obese woman resting comfortably in bed, in no distress, tired-appearing but conversational HENT: Normocephalic, atraumatic, moist mucous membranes, no pallor Eyes: PERRL, non-icteric Cardiovascular: Distant heart sounds, no audible murmurs, rubs, gallops Respiratory: Clear to auscultation bilaterally, normal work of breathing Abdomen: BS+, soft, non-tender, non-distended Extremities: Normal bulk, no edema, 2+ peripheral pulses Skin: Warm, dry, intact Neuro: Alert and oriented, flaccid paralysis of left arm Psych: Appropriate affect, clear speech, thoughts linear and goal-directed  Assessment & Plan by Problem: Principal Problem:   Lower GI bleed Active Problems:   TAKAYASU'S ARTERITIS   History of CVA (cerebrovascular accident)   Essential hypertension, malignant   Spastic hemiplegia affecting nondominant side (HCC)   HLD (hyperlipidemia)   S/P IVC filter   History of DVT (deep vein thrombosis)  Lower GI bleed, 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 Presenting with recurrence of painless large vol BRBPR 4 days after previous discharge. Taking fondiparinux and chronic prednisone. Her dose of Prednisone was recently reduced 2-3 weeks ago which may be allowing for it to flare up in her abdomen. Was seen by GI at previous hospitalization and colonoscopy revealed mild sigmoid colonic inflammation secondary to mesenteric ischemia most likely secondary to TKA. She does not have sensation in her abdomen secondary to her CVA, which could be masking symptoms of a vasculitis flare. She denies any melena but has not  yet had an EGD to r/o upper GI source. Her hemoglobin is largely unchanged from previous and she is hemodynamically stable.  -- Consult GI in AM -- Increase Prednisone to 20 mg daily -- IV Protonix 40 mg BID -- Consider obtaining CTA or MRA abdomen to confirm active inflammation of mesenteric vasculature -- hold Fondaparinux -- Optimize BP control -- Trend CBC  TKA, follows with [facility] rheumatologist. On weekly IL-6 inhibitor and chronic prednisone 12.5 mg, recently reduced from 20 mg 2-3 weeks ago. She does not like being on chronic steroids due to weight gain and other side effects. Plans to return to rheumatologist to discuss alternative agents. -- Prednisone 20 mg daily -- If still inpatient by this Saturday will require weekly Tocilizumab injection  Hypertension, BP 160s/100s on presentation, not well-controlled. Poor BP control may be contributing to her propensity to bleed.  -- Recommend starting antihypertensive med while inpatient  FEN/GI:  Regular diet, replete electrolytes as needed  DVT ppx: SCDs  Code status: Full code  Dispo: Admit patient to Observation with expected length of stay less than 2 midnights.  Signed: Asencion Partridge, MD 05/02/2017, 5:22 AM  Pager: [phone]

## 2017-05-02 NOTE — ED Notes (Signed)
Provider at bedside

## 2017-05-02 NOTE — ED Notes (Signed)
Admitting team at bedside.

## 2017-05-02 NOTE — ED Notes (Signed)
Pt refusing cardiac monitor by EMT

## 2017-05-02 NOTE — ED Notes (Signed)
Attempted report 

## 2017-05-03 DIAGNOSIS — Z95828 Presence of other vascular implants and grafts: Secondary | ICD-10-CM

## 2017-05-03 DIAGNOSIS — D6859 Other primary thrombophilia: Secondary | ICD-10-CM

## 2017-05-03 DIAGNOSIS — K529 Noninfective gastroenteritis and colitis, unspecified: Secondary | ICD-10-CM

## 2017-05-03 DIAGNOSIS — Z7901 Long term (current) use of anticoagulants: Secondary | ICD-10-CM | POA: Diagnosis not present

## 2017-05-03 DIAGNOSIS — M314 Aortic arch syndrome [Takayasu]: Secondary | ICD-10-CM | POA: Diagnosis not present

## 2017-05-03 DIAGNOSIS — Z7982 Long term (current) use of aspirin: Secondary | ICD-10-CM | POA: Diagnosis not present

## 2017-05-03 DIAGNOSIS — I69354 Hemiplegia and hemiparesis following cerebral infarction affecting left non-dominant side: Secondary | ICD-10-CM | POA: Diagnosis not present

## 2017-05-03 DIAGNOSIS — Z86718 Personal history of other venous thrombosis and embolism: Secondary | ICD-10-CM

## 2017-05-03 DIAGNOSIS — K625 Hemorrhage of anus and rectum: Secondary | ICD-10-CM

## 2017-05-03 DIAGNOSIS — E785 Hyperlipidemia, unspecified: Secondary | ICD-10-CM

## 2017-05-03 DIAGNOSIS — I10 Essential (primary) hypertension: Secondary | ICD-10-CM

## 2017-05-03 DIAGNOSIS — Z79899 Other long term (current) drug therapy: Secondary | ICD-10-CM | POA: Diagnosis not present

## 2017-05-03 DIAGNOSIS — K922 Gastrointestinal hemorrhage, unspecified: Secondary | ICD-10-CM | POA: Diagnosis not present

## 2017-05-03 LAB — CBC
HEMATOCRIT: 38 % (ref 36.0–46.0)
HEMOGLOBIN: 12.7 g/dL (ref 12.0–15.0)
MCH: 33.6 pg (ref 26.0–34.0)
MCHC: 33.4 g/dL (ref 30.0–36.0)
MCV: 100.5 fL — AB (ref 78.0–100.0)
PLATELETS: 331 10*3/uL (ref 150–400)
RBC: 3.78 MIL/uL — AB (ref 3.87–5.11)
RDW: 14.1 % (ref 11.5–15.5)
WBC: 13.1 10*3/uL — AB (ref 4.0–10.5)

## 2017-05-03 LAB — BASIC METABOLIC PANEL
ANION GAP: 6 (ref 5–15)
BUN: 5 mg/dL — ABNORMAL LOW (ref 6–20)
CHLORIDE: 107 mmol/L (ref 101–111)
CO2: 28 mmol/L (ref 22–32)
CREATININE: 0.87 mg/dL (ref 0.44–1.00)
Calcium: 9.3 mg/dL (ref 8.9–10.3)
GFR calc non Af Amer: >60 mL/min (ref >60)
Glucose, Bld: 94 mg/dL (ref 65–99)
POTASSIUM: 3.6 mmol/L (ref 3.5–5.1)
SODIUM: 141 mmol/L (ref 135–145)

## 2017-05-03 MED ORDER — PREDNISONE 20 MG PO TABS: 20.0000 mg | ORAL_TABLET | Freq: Every day | ORAL | 0 refills | Status: DC

## 2017-05-03 NOTE — Discharge Summary (Signed)
Name: [name] MRN: [id number] DOB: 1976/03/20 41 y.o. PCP: [facility] [place]  Date of Admission: 05/02/2017  2:28 AM Date of Discharge: 05/03/2017 Attending Physician: Axel Filler, *  Discharge Diagnosis: 1. Bright red blood per rectum 2. Chronic medical conditions Principal Problem:   Lower GI bleed Active Problems:   TAKAYASU'S ARTERITIS   History of CVA (cerebrovascular accident)   Essential hypertension, malignant   Spastic hemiplegia affecting nondominant side (HCC)   HLD (hyperlipidemia)   S/P IVC filter   History of DVT (deep vein thrombosis)   GIB (gastrointestinal bleeding)   Discharge Medications: Allergies as of 05/03/2017      Reactions   Pork-derived Products Other (See Comments)   Does not eat pork at all (agreeable to use Lovenox 08/11/13)      Medication List    TAKE these medications   ACTEMRA 400 MG/20ML Soln injection Generic drug:  tocilizumab Inject 400 mg/kg into the vein once a week. Take on Saturday   aspirin 81 MG EC tablet Take 1 tablet (81 mg total) by mouth daily.   Biotin 1000 MCG tablet Take 1,000 mcg by mouth daily.   CALCIUM CARBONATE-VITAMIN D PO Take 600 mg by mouth daily.   folic acid 1 MG tablet Commonly known as:  FOLVITE Take 1 mg by mouth daily.   fondaparinux 7.5 MG/0.6ML Soln injection Commonly known as:  ARIXTRA Inject 7.5 mg into the skin every evening.   Melatonin 5 MG Tabs Take 5 mg by mouth at bedtime.   multivitamin tablet Take 1 tablet by mouth daily.   predniSONE 20 MG tablet Commonly known as:  DELTASONE Take 1 tablet (20 mg total) by mouth daily. What changed:  medication strength  how much to take   VITAMIN B COMPLEX PO Take 1 tablet by mouth daily. Super B Complex   vitamin C 250 MG tablet Commonly known as:  ASCORBIC ACID Take 250 mg by mouth daily.       Disposition and follow-up:   Ms.Shakari S Lewison was discharged from [facility] in Good  condition.  At the hospital follow up visit please address:  1.  Please make adjustments to the patient's prednisone dose as this will need to be tapered. Consider referral to gastroenterology in the outpatient setting.  2.  Labs / imaging needed at time of follow-up: None  3.  Pending labs/ test needing follow-up: C. Diff results  Follow-up Appointments: 1. Patient to follow-up with the [facility] for ongoing care.   Hospital Course by problem list: Principal Problem:   Lower GI bleed Active Problems:   TAKAYASU'S ARTERITIS   History of CVA (cerebrovascular accident)   Essential hypertension, malignant   Spastic hemiplegia affecting nondominant side (HCC)   HLD (hyperlipidemia)   S/P IVC filter   History of DVT (deep vein thrombosis)   GIB (gastrointestinal bleeding)   1. Bright red blood per rectum The patient presented to the [facility] emergency department on 05/02/2017 with bright red blood per rectum. She was recently admitted approximately 1 week ago with the same chief complaint. At her prior hospitalization GI was consulted and performed a colonoscopy. For pathology and details of this report please see the chart in Epic. In brief, it showed areas of inflammation/gastritis consistent with flare of colitis most likely secondary to her arteritis. At that admission she remained hemodynamiclly stable and did not drop her hemoglobin. She was discharged to continue her home medical management. She returned on  June 7 with a single episode of bright red blood per rectum. She was afebrile and hemodynamically stable. She did not drop her hemoglobin. The case was discussed with gastroenterology over the phone who recommended conservative management. She had no additional episodes of bleeding while in the hospital. On the day of discharge the patient had no complaints and was HDS. I think the most likely etiology of the patient's bright red blood per rectum was secondary to a flare of her Takayasus  arteritis. We increased her prednisone dose to 20 mg daily. I advised the patient [facility] and develop a tapering dose schedule. She endorsed understanding of this. She'll be discharged on prednisone 20 mg daily in addition to her other home maintenance medications.  2. Chronic Medical Conditions We made no adjustments to her other chronic medications except as documented above under point #1. She will continue medical therapy as she was before she came to the hospital.  Discharge Vitals:   BP 132/66 (BP Location: Right Arm)   Pulse 71   Temp 97.7 F (36.5 C) (Oral)   Resp 18   Ht 5\' 7"  (1.702 m)   Wt 181 lb (82.1 kg)   LMP 04/26/2017 Comment: Pt has IUD in, claims no chance of pregnancy  SpO2 99%   BMI 28.35 kg/m   Pertinent Labs, Studies, and Procedures:  1. Hg remained stable   Discharge Instructions: Discharge Instructions    Diet - low sodium heart healthy    Complete by:  As directed    Discharge instructions    Complete by:  As directed    We will discharge you on an increased dose of prednisone. You will start taking 20 mg once daily. Please continue this dose until you are able to speak with your rheumatologist at the [facility]. I suggest you try calling him/her Monday morning to schedule an appointment for discuss changes to your prednisone dosing. I supplied you with a 30 day prescription for 20 mg to ensure you have this dose until you are able to discuss your case with your rheumatologist.  Please continue to eat a healthy diet. Please continue your other medications as they were prescribed before coming to the hospital.  It is been a pleasure getting to know you and taking care of you! Thank you so much for your service in the [place] [employer].   Increase activity slowly    Complete by:  As directed       Signed: Ophelia Shoulder, MD 05/03/2017, 11:11 AM   Pager: [phone]

## 2017-05-03 NOTE — Progress Notes (Signed)
Patient had a BM and because she had an order to be D/C home, RN called doctor to ask if they still want C-diff and hemoculture done. By MD, no need of these tests at this time.

## 2017-05-03 NOTE — Progress Notes (Signed)
   Subjective: No acute events overnight. Patient has had no additional bloody bowel movements or bright red blood per rectum. She is feeling well this morning. She is ready to go home. We discussed sending her out on a higher dose of her prednisone and she was amenable to this. We'll recommend follow up with her specialists through the [place].  Objective:  Vital signs in last 24 hours: Vitals:   05/02/17 1109 05/02/17 1405 05/02/17 1958 05/03/17 0553  BP:  128/78 131/72 132/66  Pulse:  92 86 71  Resp:  18 18 18   Temp:  98.1 F (36.7 C) 97.8 F (36.6 C) 97.7 F (36.5 C)  TempSrc:  Oral Oral Oral  SpO2:  100% 98% 99%  Weight: 181 lb (82.1 kg)     Height: 5\' 7"  (1.702 m)      Physical Exam  Constitutional: She is oriented to person, place, and time. She appears well-developed and well-nourished.  HENT:  Head: Normocephalic and atraumatic.  Cardiovascular: Normal rate and regular rhythm.  Exam reveals no gallop and no friction rub.   No murmur heard. Respiratory: Effort normal and breath sounds normal. No respiratory distress. She has no wheezes.  Musculoskeletal: She exhibits no edema.  Neurological: She is alert and oriented to person, place, and time.     Assessment/Plan:  Principal Problem:   Lower GI bleed Active Problems:   TAKAYASU'S ARTERITIS   History of CVA (cerebrovascular accident)   Essential hypertension, malignant   Spastic hemiplegia affecting nondominant side (HCC)   HLD (hyperlipidemia)   S/P IVC filter   History of DVT (deep vein thrombosis)   GIB (gastrointestinal bleeding)  # Lower GI bleed Patient stable this morning hemodynamically. Hemoglobin stable. She is feeling well without any complaints. She has had no additional bright red blood per rectum or bloody bowel movements. I suspect her lower GI bleed is most likely secondary to flare of her vasculitis. We have increased her prednisone to 20 mg daily. She will be discharged on this dose until she is  able to communicate with her rheumatologist at the [place]. She was agreeable to the solution. -- Increase prednisone to 20 mg daily -- Continue additional therapies -- [facility] at the [facility]   # TKA I suspect she is in a mild flare given complaints yesterday of increased pain and bright red blood per rectum. We have increased her prednisone to 20 mg daily. I attempted to contact the [facility] to discuss with her rheumatologist but was unable to reach any physicians. I discussed with the patient that she should continue on 20 mg daily until she is able to see or speak to her rheumatologist for further titration of this medication. She endorsed understanding of this. -- Prednisone 20 mg daily -- If still inpatient by this Saturday will require weekly Tocilizumab injection   FEN/GI: Regular diet, replete electrolytes as needed DVT ppx: SCDs Code status: Full code  Dispo: Anticipated discharge today.  Ophelia Shoulder, MD 05/03/2017, 8:09 AM Pager: [phone]

## 2017-05-03 NOTE — Progress Notes (Signed)
Patient was discharged home by MD order; discharged instructions review and give to patient with care notes; IV DIC; patient will be escorted to the car by a volunteer via wheelchair.  

## 2018-01-12 ENCOUNTER — Encounter (HOSPITAL_COMMUNITY): Payer: Self-pay

## 2018-01-12 ENCOUNTER — Other Ambulatory Visit: Payer: Self-pay

## 2018-01-12 ENCOUNTER — Emergency Department (HOSPITAL_COMMUNITY)
Admission: EM | Admit: 2018-01-12 | Discharge: 2018-01-12 | Disposition: A | Discharge status: Home / Self Care | Payer: Managed Care, Other (non HMO) | Attending: Emergency Medicine | Admitting: Emergency Medicine

## 2018-01-12 ENCOUNTER — Emergency Department (HOSPITAL_COMMUNITY): Payer: Managed Care, Other (non HMO)

## 2018-01-12 DIAGNOSIS — M79602 Pain in left arm: Secondary | ICD-10-CM | POA: Insufficient documentation

## 2018-01-12 DIAGNOSIS — Z5321 Procedure and treatment not carried out due to patient leaving prior to being seen by health care provider: Secondary | ICD-10-CM | POA: Diagnosis not present

## 2018-01-12 DIAGNOSIS — R079 Chest pain, unspecified: Secondary | ICD-10-CM | POA: Diagnosis not present

## 2018-01-12 LAB — BASIC METABOLIC PANEL
Anion gap: 10 (ref 5–15)
BUN: 5 mg/dL — AB (ref 6–20)
CO2: 24 mmol/L (ref 22–32)
Calcium: 9.3 mg/dL (ref 8.9–10.3)
Chloride: 106 mmol/L (ref 101–111)
Creatinine, Ser: 0.87 mg/dL (ref 0.44–1.00)
GFR calc Af Amer: >60 mL/min (ref >60)
Glucose, Bld: 138 mg/dL — ABNORMAL HIGH (ref 65–99)
POTASSIUM: 2.9 mmol/L — AB (ref 3.5–5.1)
SODIUM: 140 mmol/L (ref 135–145)

## 2018-01-12 LAB — CBC
HEMATOCRIT: 45.8 % (ref 36.0–46.0)
Hemoglobin: 16.5 g/dL — ABNORMAL HIGH (ref 12.0–15.0)
MCH: 35.1 pg — ABNORMAL HIGH (ref 26.0–34.0)
MCHC: 36 g/dL (ref 30.0–36.0)
MCV: 97.4 fL (ref 78.0–100.0)
PLATELETS: 245 10*3/uL (ref 150–400)
RBC: 4.7 MIL/uL (ref 3.87–5.11)
RDW: 12.4 % (ref 11.5–15.5)
WBC: 7.1 10*3/uL (ref 4.0–10.5)

## 2018-01-12 LAB — I-STAT TROPONIN, ED: Troponin i, poc: 0 ng/mL (ref 0.00–0.08)

## 2018-01-12 NOTE — ED Triage Notes (Signed)
Patient developed left arm pain during the night with headache, states that she could not rest due to the pain. Has had stroke with left sided deficits and states that she has paralyzes in arm but sensation present, NAD

## 2018-01-12 NOTE — ED Provider Notes (Cosign Needed)
Patient left without being seen after triage.  I did not participate in the care of this patient.   Monico Blitz, Hershal Coria 01/12/18 1312

## 2018-01-12 NOTE — ED Notes (Signed)
Have called pt multiple times in lobby with no answer

## 2018-01-12 NOTE — ED Notes (Signed)
Pt called x1 with no answer.  

## 2018-01-28 ENCOUNTER — Ambulatory Visit: Payer: 59 | Admitting: Gastroenterology

## 2018-02-18 ENCOUNTER — Encounter (INDEPENDENT_AMBULATORY_CARE_PROVIDER_SITE_OTHER): Payer: Self-pay

## 2018-02-18 ENCOUNTER — Encounter: Payer: Self-pay | Admitting: Gastroenterology

## 2018-02-18 ENCOUNTER — Ambulatory Visit (INDEPENDENT_AMBULATORY_CARE_PROVIDER_SITE_OTHER): Payer: Non-veteran care | Admitting: Gastroenterology

## 2018-02-18 VITALS — BP 126/66 | HR 76 | Ht 68.0 in | Wt 181.0 lb

## 2018-02-18 DIAGNOSIS — K625 Hemorrhage of anus and rectum: Secondary | ICD-10-CM

## 2018-02-18 DIAGNOSIS — K5900 Constipation, unspecified: Secondary | ICD-10-CM | POA: Diagnosis not present

## 2018-02-18 NOTE — Progress Notes (Signed)
[facility] Progress Note  Chief Complaint: Rectal bleeding  Subjective  History:  This is a 42 year old woman originally referred to [place] at the [place] last June for abdominal pain and rectal bleeding.  She was admitted to [facility] early June 2018, and I saw her in consultation at that time.  She has had a previous CVA and is maintained on anticoagulation and steroids due to an arteritis and clotting disorder.  A colonoscopy was attempted, but the patient did not complete the preparation.  The scope to the distal sigmoid was only possible, and she was very difficult to sedate.  I found mildly inflamed and friable mucosa but I suspect it was probably low-grade ischemic colitis. She recovered from that hospital stay but was readmitted shortly thereafter for more bleeding.  It was felt this might be related to her arteritis, and her prednisone was reportedly increased.  I reviewed that discharge summary, it does not seem that GI was reconsulted with that episode.  We have not seen her since then, and [facility] with [place] after that episode.  She has no abdominal pain and has had no more bleeding since that episode.  She does report chronic constipation for years, and sometimes has a BM only about once a week.  She finds it difficult to take much for therapy because she has decreased rectal sensation and does not know when she needs a bowel movement.  She will sometimes use a Dulcolax suppository.  Stool softeners daily have not been helpful.  ROS: Cardiovascular:  no chest pain Respiratory: no dyspnea  The patient's Past Medical, Family and Social History were reviewed and are on file in the EMR.  Objective:  Med list reviewed  Current Outpatient Medications:  .  aspirin EC 81 MG EC tablet, Take 1 tablet (81 mg total) by mouth daily., Disp: 90 tablet, Rfl: 1 .  B Complex Vitamins (VITAMIN B COMPLEX PO), Take 1 tablet by mouth daily. Super B  Complex, Disp: , Rfl:  .  CALCIUM CARBONATE-VITAMIN D PO, Take 600 mg by mouth daily. , Disp: , Rfl:  .  folic acid (FOLVITE) 1 MG tablet, Take 1 mg by mouth daily., Disp: , Rfl:  .  fondaparinux (ARIXTRA) 7.5 MG/0.6ML SOLN injection, Inject 7.5 mg into the skin every evening., Disp: , Rfl:  .  Melatonin 5 MG TABS, Take 5 mg by mouth at bedtime. , Disp: , Rfl:  .  Multiple Vitamin (MULTIVITAMIN) tablet, Take 1 tablet by mouth daily., Disp: , Rfl:  .  predniSONE (DELTASONE) 20 MG tablet, Take 1 tablet (20 mg total) by mouth daily., Disp: 30 tablet, Rfl: 0 .  tocilizumab (ACTEMRA) 400 MG/20ML SOLN injection, Inject 400 mg/kg into the vein once a week. Take on Saturday, Disp: , Rfl:  .  vitamin C (ASCORBIC ACID) 250 MG tablet, Take 250 mg by mouth daily., Disp: , Rfl:    Vital signs in last 24 hrs: Vitals:   02/18/18 1539  BP: 126/66  Pulse: 76    Physical Exam  Left hemiplegia, gets on exam table without assistance, pleasant and conversational  HEENT: sclera anicteric, oral mucosa moist without lesions  Neck: supple, no thyromegaly, JVD or lymphadenopathy  Cardiac: RRR without murmurs, S1S2 heard, no peripheral edema  Pulm: clear to auscultation bilaterally, normal RR and effort noted  Abdomen: soft, no tenderness, with active bowel sounds. No guarding or palpable hepatosplenomegaly.  Skin; warm and dry,  no jaundice or rash    @ASSESSMENTPLANBEGIN @ Assessment: Encounter Diagnoses  Name Primary?  . Constipation, unspecified constipation type Yes  . Rectal bleeding    She has chronic constipation, and I believe she got low-grade ischemic colitis as a result.  Chaniya has had no further bleeding since June 2018, and I do not think she needs a colonoscopy at this point.  We must be cautious not to use an overly aggressive bowel regimen because she has difficulty with mobility and anorectal sensation.   Plan: Discontinue stool softener since it is not helping. MiraLAX one  half capful a day, can increase as needed and tolerated. I suspect she will still need the Dulcolax suppository at least once or twice a week as needed should constipation persist. She will see me as needed.   Total time 20 minutes, over half spent in counseling and coordination of care.   [name]

## 2018-02-18 NOTE — Patient Instructions (Addendum)
Miralax - one half to one whole capful daily.  If needed, use ducolax suppository twice a week to relieve constipation.  If you are age 44 or older, your body mass index should be between 23-30. Your Body mass index is 27.52 kg/m. If this is out of the aforementioned range listed, please consider follow up with your Primary Care Provider.  If you are age 72 or younger, your body mass index should be between 19-25. Your Body mass index is 27.52 kg/m. If this is out of the aformentioned range listed, please consider follow up with your Primary Care Provider.   Follow up as needed. [phone]  Thank you for choosing [facility]  Dr Wilfrid Lund III

## 2018-06-26 ENCOUNTER — Ambulatory Visit (HOSPITAL_COMMUNITY)
Admission: EM | Admit: 2018-06-26 | Discharge: 2018-06-26 | Disposition: A | Discharge status: Home / Self Care | Payer: Managed Care, Other (non HMO) | Attending: Family Medicine | Admitting: Family Medicine

## 2018-06-26 ENCOUNTER — Encounter (HOSPITAL_COMMUNITY): Payer: Self-pay

## 2018-06-26 DIAGNOSIS — Z8249 Family history of ischemic heart disease and other diseases of the circulatory system: Secondary | ICD-10-CM | POA: Insufficient documentation

## 2018-06-26 DIAGNOSIS — Z79899 Other long term (current) drug therapy: Secondary | ICD-10-CM | POA: Diagnosis not present

## 2018-06-26 DIAGNOSIS — Z7982 Long term (current) use of aspirin: Secondary | ICD-10-CM | POA: Insufficient documentation

## 2018-06-26 DIAGNOSIS — E785 Hyperlipidemia, unspecified: Secondary | ICD-10-CM | POA: Insufficient documentation

## 2018-06-26 DIAGNOSIS — Z95828 Presence of other vascular implants and grafts: Secondary | ICD-10-CM | POA: Insufficient documentation

## 2018-06-26 DIAGNOSIS — Z8673 Personal history of transient ischemic attack (TIA), and cerebral infarction without residual deficits: Secondary | ICD-10-CM | POA: Diagnosis not present

## 2018-06-26 DIAGNOSIS — Z7952 Long term (current) use of systemic steroids: Secondary | ICD-10-CM | POA: Diagnosis not present

## 2018-06-26 DIAGNOSIS — R32 Unspecified urinary incontinence: Secondary | ICD-10-CM | POA: Insufficient documentation

## 2018-06-26 DIAGNOSIS — Z9882 Breast implant status: Secondary | ICD-10-CM | POA: Diagnosis not present

## 2018-06-26 DIAGNOSIS — R1011 Right upper quadrant pain: Secondary | ICD-10-CM | POA: Diagnosis not present

## 2018-06-26 DIAGNOSIS — K5900 Constipation, unspecified: Secondary | ICD-10-CM | POA: Insufficient documentation

## 2018-06-26 DIAGNOSIS — Z823 Family history of stroke: Secondary | ICD-10-CM | POA: Diagnosis not present

## 2018-06-26 DIAGNOSIS — I10 Essential (primary) hypertension: Secondary | ICD-10-CM | POA: Diagnosis not present

## 2018-06-26 DIAGNOSIS — Z86718 Personal history of other venous thrombosis and embolism: Secondary | ICD-10-CM | POA: Insufficient documentation

## 2018-06-26 LAB — CBC
HCT: 46.6 % — ABNORMAL HIGH (ref 36.0–46.0)
Hemoglobin: 16.6 g/dL — ABNORMAL HIGH (ref 12.0–15.0)
MCH: 34.9 pg — AB (ref 26.0–34.0)
MCHC: 35.6 g/dL (ref 30.0–36.0)
MCV: 97.9 fL (ref 78.0–100.0)
Platelets: 323 10*3/uL (ref 150–400)
RBC: 4.76 MIL/uL (ref 3.87–5.11)
RDW: 12.1 % (ref 11.5–15.5)
WBC: 8.1 10*3/uL (ref 4.0–10.5)

## 2018-06-26 LAB — POCT URINALYSIS DIP (DEVICE)
BILIRUBIN URINE: NEGATIVE
GLUCOSE, UA: NEGATIVE mg/dL
Ketones, ur: NEGATIVE mg/dL
NITRITE: NEGATIVE
Protein, ur: NEGATIVE mg/dL
Specific Gravity, Urine: 1.02 (ref 1.005–1.030)
UROBILINOGEN UA: 0.2 mg/dL (ref 0.0–1.0)
pH: 7 (ref 5.0–8.0)

## 2018-06-26 LAB — COMPREHENSIVE METABOLIC PANEL
ALBUMIN: 4.5 g/dL (ref 3.5–5.0)
ALK PHOS: 49 U/L (ref 38–126)
ALT: 40 U/L (ref 0–44)
ANION GAP: 12 (ref 5–15)
AST: 40 U/L (ref 15–41)
BUN: 7 mg/dL (ref 6–20)
CALCIUM: 10.1 mg/dL (ref 8.9–10.3)
CO2: 25 mmol/L (ref 22–32)
Chloride: 103 mmol/L (ref 98–111)
Creatinine, Ser: 1.04 mg/dL — ABNORMAL HIGH (ref 0.44–1.00)
GFR calc non Af Amer: >60 mL/min (ref >60)
GLUCOSE: 89 mg/dL (ref 70–99)
POTASSIUM: 3.2 mmol/L — AB (ref 3.5–5.1)
SODIUM: 140 mmol/L (ref 135–145)
TOTAL PROTEIN: 7.1 g/dL (ref 6.5–8.1)
Total Bilirubin: 0.8 mg/dL (ref 0.3–1.2)

## 2018-06-26 LAB — LIPASE, BLOOD: Lipase: 59 U/L — ABNORMAL HIGH (ref 11–51)

## 2018-06-26 NOTE — ED Triage Notes (Signed)
Pt presents with pain in upper right quadrant and right side

## 2018-06-26 NOTE — ED Provider Notes (Signed)
[facility]    CSN: [id number] Arrival date & time: 06/26/18  1817     History   Chief Complaint Chief Complaint  Patient presents with  . Abdominal Pain    upper right quadrant and right side    HPI [name] is a 42 y.o. female.   [name] presents with complaints of RUQ abdominal pain which started last night after eating [employer]. Radiated to her right back. Persisted today, has not worsened. Took a 75mg  tab of diclofenac which helped with pain and now with minimal pain. States it is now so mild she is unable to describe the pain. No known fevers. Takes prednisone daily due to arthritis, is on blood thinner injection daily due to stroke history. Left sided deficits. She is incontinent of urine s/p stroke. No nausea or vomting, has been able to eat today which did not worsen pain. No diarrhea. History of constipation, uses laxatives weekly as well as suppository every three days, large BM yesterday.    ROS per HPI.      Past Medical History:  Diagnosis Date  . Anemia   . Anxiety   . Arthritis    Takyasu Arthritis   . Benign tumor of back   . Carotid occlusion, left   . DVT (deep venous thrombosis) ([place])   . GI bleed 04/2017  . Hypertension   . Menorrhagia   . PVD (peripheral vascular disease) ([place])   . Stroke (cerebrum) ([place])   . Stroke [facility])    left sided deficits  . Swelling, lymph nodes 2006   intermittant, benign    Patient Active Problem List   Diagnosis Date Noted  . Lower GI bleed 05/02/2017  . History of DVT (deep vein thrombosis) 05/02/2017  . GIB (gastrointestinal bleeding) 05/02/2017  . Bleeding per rectum 04/26/2017  . S/P IVC filter 04/26/2017  . HLD (hyperlipidemia)   . Spastic hemiplegia affecting nondominant side ([place]) 09/10/2013  . Cognitive deficits, late effect of cerebrovascular disease 09/10/2013  . 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 Postprocedural respiratory failure ([place]) 02/16/2013  . Essential  hypertension, malignant 02/16/2013  . Sinus tachycardia 02/16/2013  . History of CVA (cerebrovascular accident) 12/05/2012  . Subtherapeutic international normalized ratio (INR) 12/05/2012  . DEPRESSION 09/04/2006  . 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 CONSTIPATION 09/04/2006  . OVARIAN CYST 09/04/2006  . FOOT DROP, LEFT 09/04/2006    Past Surgical History:  Procedure Laterality Date  . BREAST SURGERY     bil breast implants  . COLONOSCOPY WITH PROPOFOL N/A 04/28/2017   Procedure: COLONOSCOPY WITH PROPOFOL;  Surgeon: Doran Stabler, MD;  Location: [facility];  Service: Endoscopy;  Laterality: N/A;  . ivc filter placement     TrapEase IVC filter Safe 1.5T    OB History    Gravida  1   Para      Term      Preterm      AB      Living  1     SAB      TAB      Ectopic      Multiple      Live Births               Home Medications    Prior to Admission medications   Medication Sig Start Date End Date Taking? Authorizing Provider  aspirin EC 81 MG EC tablet Take 1 tablet (81 mg total) by mouth daily. 07/01/15   Burgess Estelle,  MD  B Complex Vitamins (VITAMIN B COMPLEX PO) Take 1 tablet by mouth daily. Super B Complex    [provider]  CALCIUM CARBONATE-VITAMIN D PO Take 600 mg by mouth daily.     [provider]  folic acid (FOLVITE) 1 MG tablet Take 1 mg by mouth daily.    [provider]  fondaparinux (ARIXTRA) 7.5 MG/0.6ML SOLN injection Inject 7.5 mg into the skin every evening. 09/11/15   [provider]  Melatonin 5 MG TABS Take 5 mg by mouth at bedtime.     [provider]  Multiple Vitamin (MULTIVITAMIN) tablet Take 1 tablet by mouth daily.    [provider]  predniSONE (DELTASONE) 20 MG tablet Take 1 tablet (20 mg total) by mouth daily. 05/03/17   Ophelia Shoulder, MD  tocilizumab (ACTEMRA) 400 MG/20ML SOLN injection Inject 400 mg/kg into the vein once a week. Take on Saturday    [provider]  vitamin C (ASCORBIC ACID) 250 MG tablet Take 250 mg by mouth daily.    [provider]    Family History Family History  Problem Relation Age of Onset  . Cancer - Other Father   . Coronary artery disease Mother   . Stroke Mother   . Hypertension Mother   . Hypertension Sister   . Colon cancer Neg Hx     Social History Social History   Tobacco Use  . Smoking status: Never Smoker  . Smokeless tobacco: Never Used  Substance Use Topics  . Alcohol use: No  . Drug use: No     Allergies   Pork-derived products   Review of Systems Review of Systems   Physical Exam Triage Vital Signs ED Triage Vitals  Enc Vitals Group     BP 06/26/18 1848 (!) 141/87     Pulse Rate 06/26/18 1848 99     Resp 06/26/18 1848 20     Temp 06/26/18 1910 98.1 F (36.7 C)     Temp src --      SpO2 06/26/18 1848 100 %     Weight --      Height --      Head Circumference --      Peak Flow --      Pain Score --      Pain Loc --      Pain Edu? --      Excl. in [place]? --    No data found.  Updated Vital Signs BP (!) 141/87 (BP Location: Right Arm)   Pulse 99   Temp 98.1 F (36.7 C)   Resp 20   SpO2 100%    Physical Exam  Constitutional: She is oriented to person, place, and time. She appears well-developed and well-nourished. No distress.  Cardiovascular: Normal rate, regular rhythm and normal heart sounds.  Pulmonary/Chest: Effort normal and breath sounds normal.  Abdominal: Soft. Bowel sounds are normal. There is no hepatosplenomegaly. There is no tenderness. There is CVA tenderness. There is no rigidity, no rebound, no guarding, no tenderness at McBurney's point and negative Murphy's sign.  Neurological: She is alert and oriented to person, place, and time.  Skin: Skin is warm and dry.     UC Treatments / Results  Labs (all labs ordered are listed, but only abnormal results are displayed) Labs Reviewed  POCT URINALYSIS DIP (DEVICE) - Abnormal; Notable for the  following components:      Result Value   Hgb urine dipstick LARGE (*)  Leukocytes, UA TRACE (*)    All other components within normal limits  URINE CULTURE  CBC  COMPREHENSIVE METABOLIC PANEL  LIPASE, BLOOD    EKG None  Radiology No results found.  Procedures Procedures (including critical care time)  Medications Ordered in UC Medications - No data to display  Initial Impression / Assessment and Plan / UC Course  I have reviewed the triage vital signs and the nursing notes.  Pertinent labs & imaging results that were available during my care of the patient were reviewed by me and considered in my medical decision making (see chart for details).     Non toxic in appearance. Afebrile. Pain improved after diclofenac. Initially worse after eating. No nausea or vomiting, no fevers. She is on her period. Concern for gallstones, labs pending to evaluate for cholecystitis. ua collected as she did seem to have CVA tenderness and is incontinent of urine. UA with trace leuks, will culture. Will call if emergent labs result. Otherwise already has appointment next week with PCP. Return precautions provided. Patient verbalized understanding and agreeable to plan.    Final Clinical Impressions(s) / UC Diagnoses   Final diagnoses:  Right upper quadrant abdominal pain     Discharge Instructions     Limit fat in your diet as this may exacerbate symptoms.  Tylenol as needed for pain control, may use intermittent diclofenac for severe pain- increases your risk of bleeding as you are already on a blood thinner and steroids.  We will call you tonight or tomorrow morning if your labs are abnormal and warrant more urgent follow up, otherwise please make appointment with your primary care provider for outpatient follow up as you will likely need further imaging.  If you develop worsening of pain,fevers, shortness of breath , nausea or vomiting, or otherwise worsening please go to the ER sooner.      ED Prescriptions    None     Controlled Substance Prescriptions [place] Controlled Substance Registry consulted? Not Applicable   Zigmund Gottron, NP 06/26/18 [phone]

## 2018-06-26 NOTE — Discharge Instructions (Addendum)
Limit fat in your diet as this may exacerbate symptoms.  Tylenol as needed for pain control, may use intermittent diclofenac for severe pain- increases your risk of bleeding as you are already on a blood thinner and steroids.  We will call you tonight or tomorrow morning if your labs are abnormal and warrant more urgent follow up, otherwise please make appointment with your primary care provider for outpatient follow up as you will likely need further imaging.  If you develop worsening of pain,fevers, shortness of breath , nausea or vomiting, or otherwise worsening please go to the ER sooner.

## 2018-06-29 LAB — URINE CULTURE

## 2018-07-01 ENCOUNTER — Telehealth (HOSPITAL_COMMUNITY): Payer: Self-pay

## 2018-07-01 MED ORDER — NITROFURANTOIN MONOHYD MACRO 100 MG PO CAPS: 100.0000 mg | ORAL_CAPSULE | Freq: Two times a day (BID) | ORAL | 0 refills | Status: DC

## 2018-07-01 NOTE — Telephone Encounter (Signed)
Urine culture positive for e.coli. This was not treated at ucc visit. Rx for Macrobid 100 mg bid x 5 days sent to pharmacy of choice. Attempted to reach patient. No answer at this time. Voicemail left.

## 2018-09-01 ENCOUNTER — Telehealth: Payer: Self-pay | Admitting: *Deleted

## 2018-09-01 NOTE — Telephone Encounter (Signed)
error 

## 2018-12-28 ENCOUNTER — Ambulatory Visit (HOSPITAL_COMMUNITY)
Admission: EM | Admit: 2018-12-28 | Discharge: 2018-12-28 | Disposition: A | Discharge status: Home / Self Care | Payer: Managed Care, Other (non HMO) | Attending: Family Medicine | Admitting: Family Medicine

## 2018-12-28 ENCOUNTER — Encounter (HOSPITAL_COMMUNITY): Payer: Self-pay

## 2018-12-28 ENCOUNTER — Other Ambulatory Visit: Payer: Self-pay

## 2018-12-28 DIAGNOSIS — J22 Unspecified acute lower respiratory infection: Secondary | ICD-10-CM

## 2018-12-28 DIAGNOSIS — F418 Other specified anxiety disorders: Secondary | ICD-10-CM | POA: Diagnosis not present

## 2018-12-28 DIAGNOSIS — N938 Other specified abnormal uterine and vaginal bleeding: Secondary | ICD-10-CM

## 2018-12-28 MED ORDER — BENZONATATE 200 MG PO CAPS: 200.0000 mg | ORAL_CAPSULE | Freq: Two times a day (BID) | ORAL | 0 refills | Status: DC | PRN

## 2018-12-28 MED ORDER — AMOXICILLIN-POT CLAVULANATE 875-125 MG PO TABS: 1.0000 | ORAL_TABLET | Freq: Two times a day (BID) | ORAL | 0 refills | Status: DC

## 2018-12-28 MED ORDER — ALPRAZOLAM 0.5 MG PO TABS: 0.5000 mg | ORAL_TABLET | Freq: Two times a day (BID) | ORAL | 0 refills | Status: DC | PRN

## 2018-12-28 NOTE — ED Triage Notes (Signed)
Pt cc states she has been having panic attack she is dealing with some home issues. Pt cc her period has been on since 12/17/18 until now. Pt states she has a bad cough 1 week.
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
## 2018-12-28 NOTE — Discharge Instructions (Signed)
Take the antibiotic 2 x a day Push fluids Take the cough medicine 2 x a day Expect improvement in a few days  Take the alprazolem ( xanax)  for panic Avoid caffeine Try to get enough sleep

## 2021-02-13 ENCOUNTER — Encounter (HOSPITAL_COMMUNITY): Payer: Self-pay | Admitting: Emergency Medicine

## 2021-02-13 ENCOUNTER — Ambulatory Visit (HOSPITAL_COMMUNITY)
Admission: EM | Admit: 2021-02-13 | Discharge: 2021-02-13 | Disposition: A | Discharge status: Home / Self Care | Payer: Non-veteran care | Attending: Student | Admitting: Student

## 2021-02-13 ENCOUNTER — Other Ambulatory Visit: Payer: Self-pay

## 2021-02-13 DIAGNOSIS — N6452 Nipple discharge: Secondary | ICD-10-CM | POA: Diagnosis not present

## 2021-02-13 DIAGNOSIS — R5383 Other fatigue: Secondary | ICD-10-CM | POA: Diagnosis not present

## 2021-02-13 DIAGNOSIS — Z3202 Encounter for pregnancy test, result negative: Secondary | ICD-10-CM | POA: Diagnosis not present

## 2021-02-13 DIAGNOSIS — Z9882 Breast implant status: Secondary | ICD-10-CM | POA: Diagnosis not present

## 2021-02-13 DIAGNOSIS — Z975 Presence of (intrauterine) contraceptive device: Secondary | ICD-10-CM | POA: Diagnosis not present

## 2021-02-13 DIAGNOSIS — Z09 Encounter for follow-up examination after completed treatment for conditions other than malignant neoplasm: Secondary | ICD-10-CM

## 2021-02-13 DIAGNOSIS — Z862 Personal history of diseases of the blood and blood-forming organs and certain disorders involving the immune mechanism: Secondary | ICD-10-CM

## 2021-02-13 DIAGNOSIS — Z8673 Personal history of transient ischemic attack (TIA), and cerebral infarction without residual deficits: Secondary | ICD-10-CM

## 2021-02-13 LAB — POC URINE PREG, ED: Preg Test, Ur: NEGATIVE

## 2021-02-13 NOTE — ED Triage Notes (Signed)
Pt presents today with c/o morning tiredness and breast discharge (bilateral). She does report having an IUD in placed "many years ago". LMP 02/08/21 approx.

## 2021-02-13 NOTE — Discharge Instructions (Addendum)
-  I placed a referral for a mammogram.  The breast center in [place] will reach out to you for a mammogram and follow-up appointment with the women's health provider. -For your fatigue, I recommend that you follow-up with your primary care for this since you declined blood work today.  You should follow-up with your primary care for iron testing given your history of anemia.  Please do this in the next 2 to 3 weeks. -Seek additional immediate medical attention if you develop new symptoms like headaches, vision changes, worsening of discharge, breast tenderness or redness or swelling or pain, fever/chills.

## 2021-02-13 NOTE — ED Provider Notes (Signed)
[name]    CSN: [id number] Arrival date & time: 02/13/21  1130      History   Chief Complaint Chief Complaint  Patient presents with  . Fatigue  . Breast Discharge  . Possible Pregnancy    HPI [name] is a 44 y.o. female presenting with bilateral nipple discharge and fatigue.  History anemia, anxiety, takyasu osteoarthritis, benign tumor of back, left carotid occlusion, DVT, GI bleed, menorrhagia, peripheral vascular disease, stroke with left-sided deficits.  States that for the last 3 years, when she squeezes her nipples scant greenish-white discharge will come out, L>R.  This does not happen spontaneously.  States there are no changes in discharge, like breast tenderness, redness, swelling, pain, fever/chills. did not feel like going to see her primary care and so she presented to this urgent care today.    Left-sided deficits following 2014 stroke, denies changes in these. History bilateral breast implants. Denies headaches, vision changes, vision loss. History iron deficiency anemia previously on iron supplement, today with 3 weeks of fatigue. She is unsure how long she has had her IUD. She is unsure when her last mammogram was. LMP 02/08/2021, she is still on her period  HPI  Past Medical History:  Diagnosis Date  . Anemia   . Anxiety   . Arthritis    Takyasu Arthritis   . Benign tumor of back   . Carotid occlusion, left   . DVT (deep venous thrombosis) ([place])   . GI bleed 04/2017  . Hypertension   . Menorrhagia   . PVD (peripheral vascular disease) ([place])   . Stroke (cerebrum) ([place])   . Stroke [facility])    left sided deficits  . Swelling, lymph nodes 2006   intermittant, benign    Patient Active Problem List   Diagnosis Date Noted  . Lower GI bleed 05/02/2017  . History of DVT (deep vein thrombosis) 05/02/2017  . GIB (gastrointestinal bleeding) 05/02/2017  . Bleeding per rectum 04/26/2017  . S/P IVC filter 04/26/2017  . HLD (hyperlipidemia)    . Spastic hemiplegia affecting nondominant side ([place]) 09/10/2013  . Cognitive deficits, late effect of cerebrovascular disease 09/10/2013  . 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 Postprocedural respiratory failure ([place]) 02/16/2013  . Essential hypertension, malignant 02/16/2013  . Sinus tachycardia 02/16/2013  . History of CVA (cerebrovascular accident) 12/05/2012  . Subtherapeutic international normalized ratio (INR) 12/05/2012  . DEPRESSION 09/04/2006  . 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 CONSTIPATION 09/04/2006  . OVARIAN CYST 09/04/2006  . FOOT DROP, LEFT 09/04/2006    Past Surgical History:  Procedure Laterality Date  . BREAST SURGERY     bil breast implants  . COLONOSCOPY WITH PROPOFOL N/A 04/28/2017   Procedure: COLONOSCOPY WITH PROPOFOL;  Surgeon: Doran Stabler, MD;  Location: [facility];  Service: Endoscopy;  Laterality: N/A;  . ivc filter placement     TrapEase IVC filter Safe 1.5T    OB History    Gravida  1   Para      Term      Preterm      AB      Living  1     SAB      IAB      Ectopic      Multiple      Live Births               Home Medications    Prior to Admission medications   Medication Sig Start Date End  Date Taking? Authorizing Provider  ALPRAZolam Duanne Moron) 0.5 MG tablet Take 1 tablet (0.5 mg total) by mouth 2 (two) times daily as needed for anxiety. 12/28/18   Raylene Everts, MD  amoxicillin-clavulanate (AUGMENTIN) 875-125 MG tablet Take 1 tablet by mouth every 12 (twelve) hours. 12/28/18   Raylene Everts, MD  aspirin EC 81 MG EC tablet Take 1 tablet (81 mg total) by mouth daily. 07/01/15   Burgess Estelle, MD  B Complex Vitamins (VITAMIN B COMPLEX PO) Take 1 tablet by mouth daily. Super B Complex    [provider]  benzonatate (TESSALON) 200 MG capsule Take 1 capsule (200 mg total) by mouth 2 (two) times daily as needed for cough. 12/28/18   Raylene Everts, MD  CALCIUM CARBONATE-VITAMIN D PO Take 600 mg by  mouth daily.     [provider]  folic acid (FOLVITE) 1 MG tablet Take 1 mg by mouth daily.    [provider]  fondaparinux (ARIXTRA) 7.5 MG/0.6ML SOLN injection Inject 7.5 mg into the skin every evening. 09/11/15   [provider]  Melatonin 5 MG TABS Take 5 mg by mouth at bedtime.     [provider]  Multiple Vitamin (MULTIVITAMIN) tablet Take 1 tablet by mouth daily.    [provider]  nitrofurantoin, macrocrystal-monohydrate, (MACROBID) 100 MG capsule Take 1 capsule (100 mg total) by mouth 2 (two) times daily. 07/01/18   Raylene Everts, MD  tocilizumab (ACTEMRA) 400 MG/20ML SOLN injection Inject 400 mg/kg into the vein once a week. Take on Saturday    [provider]  vitamin C (ASCORBIC ACID) 250 MG tablet Take 250 mg by mouth daily.    [provider]    Family History Family History  Problem Relation Age of Onset  . Cancer - Other Father   . Coronary artery disease Mother   . Stroke Mother   . Hypertension Mother   . Hypertension Sister   . Colon cancer Neg Hx     Social History Social History   Tobacco Use  . Smoking status: Never Smoker  . Smokeless tobacco: Never Used  Vaping Use  . Vaping Use: Never used  Substance Use Topics  . Alcohol use: No  . Drug use: No     Allergies   Pork-derived products   Review of Systems Review of Systems  Genitourinary:       Bilateral breast discharge.  All other systems reviewed and are negative.    Physical Exam Triage Vital Signs ED Triage Vitals  Enc Vitals Group     BP 02/13/21 1202 (!) 150/77     Pulse Rate 02/13/21 1202 78     Resp 02/13/21 1202 16     Temp 02/13/21 1202 97.8 F (36.6 C)     Temp Source 02/13/21 1202 Oral     SpO2 02/13/21 1204 98 %     Weight --      Height --      Head Circumference --      Peak Flow --      Pain Score 02/13/21 1159 0     Pain Loc --      Pain Edu? --      Excl. in [place]? --    No data  found.  Updated Vital Signs BP (!) 150/77 (BP Location: Right Arm)   Pulse 78   Temp 97.8 F (36.6 C) (Oral)   Resp 16   LMP 02/08/2021   SpO2  98%   Visual Acuity Right Eye Distance:   Left Eye Distance:   Bilateral Distance:    Right Eye Near:   Left Eye Near:    Bilateral Near:     Physical Exam Vitals reviewed.  Constitutional:      General: She is not in acute distress.    Appearance: Normal appearance. She is not ill-appearing.  HENT:     Head: Normocephalic and atraumatic.  Cardiovascular:     Rate and Rhythm: Normal rate and regular rhythm.     Heart sounds: Normal heart sounds.  Pulmonary:     Effort: Pulmonary effort is normal.     Breath sounds: Normal breath sounds. No wheezing, rhonchi or rales.  Chest:     Comments: Pendulous breasts. No nipple discharge able to be expressed. No erythema swelling lumps discharge.  Abdominal:     Tenderness: There is no abdominal tenderness. There is no right CVA tenderness, left CVA tenderness, guarding or rebound. Negative signs include Murphy's sign, Rovsing's sign and McBurney's sign.  Musculoskeletal:     Cervical back: Normal range of motion and neck supple. No rigidity.  Lymphadenopathy:     Cervical: No cervical adenopathy.  Neurological:     Mental Status: She is alert and oriented to person, place, and time. Mental status is at baseline.     Cranial Nerves: Cranial nerves are intact. No cranial nerve deficit or facial asymmetry.     Sensory: Sensation is intact. No sensory deficit.     Motor: Weakness present.     Coordination: Coordination is intact. Coordination normal.     Gait: Gait is intact. Gait normal.     Comments: L arm paralysis, unchanged. CN 2-12 otherwise intact. Visual fields full to confrontation, EOMI, PERRLA  Psychiatric:        Mood and Affect: Mood normal.        Behavior: Behavior normal.        Thought Content: Thought content normal.        Judgment: Judgment normal.      UC  Treatments / Results  Labs (all labs ordered are listed, but only abnormal results are displayed) Labs Reviewed  POC URINE PREG, ED    EKG   Radiology No results found.  Procedures Procedures (including critical care time)  Medications Ordered in UC Medications - No data to display  Initial Impression / Assessment and Plan / UC Course  I have reviewed the triage vital signs and the nursing notes.  Pertinent labs & imaging results that were available during my care of the patient were reviewed by me and considered in my medical decision making (see chart for details).     This patient is a 45 year old female presenting with bilateral nipple discharge for 3 years, unchanged. Today she is afebrile and nontachycardic, no evidence of infection on breast exam. Patient attempted to express discharge during this visit and was not able to produce any discharge.  Negative urine pregnancy.  Continue IUD for contraception.  Patient with history of iron deficiency anemia, declines blood work multiple times for CBC today.  Rec close follow-up with PCP for this.  She verbalizes understanding and agreement.  History of stroke, no new deficits today. No visual field deficits or visual changes.  Mammogram and breast center referral placed. She cannot recall when her last mammogram was.   Return precautions discussed.  This chart was dictated using voice recognition software, Dragon. Despite the best efforts of this provider to proofread  and correct errors, errors may still occur which can change documentation meaning.   Final Clinical Impressions(s) / UC Diagnoses   Final diagnoses:  Negative pregnancy test  IUD contraception  Bilateral nipple discharge  Breast implant in situ  Other fatigue  History of iron deficiency anemia  History of ischemic stroke     Discharge Instructions     -I placed a referral for a mammogram.  The breast center in [place] will reach out to you for  a mammogram and follow-up appointment with the women's health provider. -For your fatigue, I recommend that you follow-up with your primary care for this since you declined blood work today.  You should follow-up with your primary care for iron testing given your history of anemia.  Please do this in the next 2 to 3 weeks. -Seek additional immediate medical attention if you develop new symptoms like headaches, vision changes, worsening of discharge, breast tenderness or redness or swelling or pain, fever/chills.    ED Prescriptions    None     PDMP not reviewed this encounter.   Hazel Sams, PA-C 02/13/21 1434

## 2021-02-16 ENCOUNTER — Other Ambulatory Visit: Payer: Self-pay | Admitting: Student

## 2021-02-16 DIAGNOSIS — N6452 Nipple discharge: Secondary | ICD-10-CM

## 2021-03-23 ENCOUNTER — Ambulatory Visit (HOSPITAL_COMMUNITY)
Admission: EM | Admit: 2021-03-23 | Discharge: 2021-03-23 | Disposition: A | Discharge status: Home / Self Care | Payer: 59 | Attending: Psychiatry | Admitting: Psychiatry

## 2021-03-23 ENCOUNTER — Other Ambulatory Visit: Payer: Self-pay

## 2021-03-23 DIAGNOSIS — R440 Auditory hallucinations: Secondary | ICD-10-CM | POA: Insufficient documentation

## 2021-03-23 DIAGNOSIS — F310 Bipolar disorder, current episode hypomanic: Secondary | ICD-10-CM | POA: Diagnosis present

## 2021-03-23 MED ORDER — BENZTROPINE MESYLATE 0.5 MG PO TABS: 0.5000 mg | ORAL_TABLET | Freq: Every day | ORAL | 1 refills | Status: AC

## 2021-03-23 MED ORDER — TRAZODONE HCL 50 MG PO TABS: 50.0000 mg | ORAL_TABLET | Freq: Every day | ORAL | 1 refills | Status: AC

## 2021-03-23 MED ORDER — RISPERIDONE 2 MG PO TABS: 2.0000 mg | ORAL_TABLET | Freq: Every day | ORAL | 1 refills | Status: DC

## 2021-03-23 NOTE — ED Provider Notes (Signed)
[facility] Medical Screening Exam  Patient Name: [name] MRN: [id number] Date of Evaluation: 03/23/21 Chief Complaint:   Diagnosis:  Final diagnoses:  Auditory hallucinations  Bipolar affective disorder, current episode hypomanic ([place])    History of Present illness: [name] is a 45 y.o. female.  Patient presents voluntarily as a walk-in to the [facility] reporting auditory hallucinations telling her to go to her ex-husband's house to see if there is another woman there.  Patient reports that her and her husband separated and sold their house last month.  She states that they have been trying to work things out.  She states that she is suspected that he is seeing another woman and that she has had confrontations with this woman as well as with her ex-husband.  She reports that she is not suicidal nor homicidal and denies any visual hallucinations.  She states that she had 1 other episode in March of last year where she was admitted to the [facility] for inpatient hospitalization.  Patient had paperwork with her and I discussed that the patient was admitted due to psychosis and they were considering the patient having bipolar disorder.  Patient was started on Risperdal 2 mg p.o. nightly and trazodone 50 mg p.o. nightly.  Patient showed improvement with the medications and she is here today requesting to restart these medications.  She stated that she had stopped them for a while.  Patient also reported some side effects to the Risperdal and was requesting to be started on Cogentin 0.5 mg p.o. daily.  The patient does report that she did not get in trouble for talking to other people about this woman that her ex-husband is possibly seeing.  She reports that because she was discussing this with other people and they were talking behind her back and she has been charged with solicitation of first-degree murder and accused of possibly hiring someone to kill this woman.  She adamantly  denies it and states that she does have a court date coming up in May.  Patient states that she is not a threat to anyone or to herself.  She reports that she just wants the voices to go away telling her to go to her ex-husband's house and she reports that she has had poor sleep for the last 2 to 3 days.  She states that the medications did help her with sleep in the past and they did help her with the voices going away. Patient is pleasant, calm, cooperative.  Patient has congruent affect.  Patient does have rapid speech and is tangential at times when discussing the events that have happened with her husband.  However patient is able to have logical thought process, stop her communication and have a conversation with me of logical thought as well as decision making.  Patient is able to tell me which pharmacy she uses and agrees that the medications in the past have worked and is here to simply restart her medications.  Patient reports that she does have insurance and that she is seeing a therapist currently but would like to be established with a psychiatrist.  Patient is provided with outpatient resources for psychiatry for medication management.  I have agreed to prescribe the patient Risperdal 2 mg p.o. nightly, trazodone 50 mg p.o. nightly, and Cogentin 0.5 mg p.o. daily that has been prescribed to pharmacy of choice.  Psychiatric Specialty Exam  Presentation  General Appearance:Appropriate for Environment; Casual  Eye Contact:Good  Speech:Clear and Coherent; Other (comment) (rapid at times but not pressured)  Speech Volume:Increased  Handedness:Right   Mood and Affect  Mood:Anxious; Euthymic  Affect:Appropriate; Congruent   Thought Process  Thought Processes:Coherent  Descriptions of Associations:Intact  Orientation:Full (Time, Place and Person)  Thought Content:Tangential; WDL    Hallucinations:Auditory Daily voices telling her to go see if there is a woman at her ex-husbands  house  Ideas of Reference:Paranoia (That there is a woman at her husband's house)  Suicidal Thoughts:No  Homicidal Thoughts:No   Sensorium  Memory:Immediate Good; Recent Good; Remote Good  Judgment:Fair  Insight:Fair   Executive Functions  Concentration:Good  Attention Span:Good  [place] [employer]  Language:Good   Psychomotor Activity  Psychomotor Activity:Normal   Assets  Assets:Communication Skills; Desire for Improvement; Financial Resources/Insurance; Housing; Physical Health; Social Support; Transportation   Sleep  Sleep:Poor  Number of hours: No data recorded  No data recorded  Physical Exam: Physical Exam Vitals and nursing note reviewed.  Constitutional:      Appearance: She is well-developed.  HENT:     Head: Normocephalic.  Eyes:     Pupils: Pupils are equal, round, and reactive to light.  Cardiovascular:     Rate and Rhythm: Normal rate.  Pulmonary:     Effort: Pulmonary effort is normal.  Musculoskeletal:        General: Normal range of motion.  Neurological:     Mental Status: She is alert and oriented to person, place, and time.    Review of Systems  Constitutional: Negative.   HENT: Negative.   Eyes: Negative.   Respiratory: Negative.   Cardiovascular: Negative.   Gastrointestinal: Negative.   Genitourinary: Negative.   Musculoskeletal: Negative.   Skin: Negative.   Neurological: Negative.   Endo/Heme/Allergies: Negative.   Psychiatric/Behavioral: Positive for hallucinations. The patient is nervous/anxious and has insomnia.    Blood pressure 133/79, pulse 82, temperature 98.4 F (36.9 C), temperature source Oral, resp. rate 16, SpO2 98 %. There is no height or weight on file to calculate BMI.  Musculoskeletal: Strength & Muscle Tone: within normal limits Gait & Station: normal Patient leans: N/A   [place] MSE Discharge Disposition for Follow up and Recommendations: Based on my evaluation the patient  does not appear to have an emergency medical condition and can be discharged with resources and follow up care in outpatient services for Medication Management and [place], FNP 03/23/2021, 12:51 PM

## 2021-03-23 NOTE — Discharge Instructions (Addendum)

## 2021-03-23 NOTE — BH Assessment (Signed)
Patient reported to [facility] reporting [place] voices telling her to go to her ex husbands house to see if other women are there. Patient denies SI/ HI / VH is a veteran who suffered a stroke in 2014 and has paralysis. Patient stated she has previous inpatient history over a year ago for psychosis in the [place] hospital. Patient reports using CBD gummies in past 24-48 hours . Patient reports per her primary care doctor for follow up . Patient is routine.

## 2021-03-23 NOTE — ED Notes (Signed)
Pt discharged in no acute distress. Verbalized understanding of discharge instructions to include resources for follow up. Safety maintained.

## 2021-07-21 ENCOUNTER — Encounter (HOSPITAL_COMMUNITY): Payer: Self-pay

## 2021-07-21 ENCOUNTER — Other Ambulatory Visit: Payer: Self-pay

## 2021-07-21 ENCOUNTER — Emergency Department (HOSPITAL_COMMUNITY): Payer: No Typology Code available for payment source

## 2021-07-21 ENCOUNTER — Emergency Department (HOSPITAL_COMMUNITY)
Admission: EM | Admit: 2021-07-21 | Discharge: 2021-07-21 | Disposition: A | Discharge status: Home / Self Care | Payer: No Typology Code available for payment source | Attending: Emergency Medicine | Admitting: Emergency Medicine

## 2021-07-21 DIAGNOSIS — Z20822 Contact with and (suspected) exposure to covid-19: Secondary | ICD-10-CM | POA: Insufficient documentation

## 2021-07-21 DIAGNOSIS — Z7982 Long term (current) use of aspirin: Secondary | ICD-10-CM | POA: Insufficient documentation

## 2021-07-21 DIAGNOSIS — Z79899 Other long term (current) drug therapy: Secondary | ICD-10-CM | POA: Insufficient documentation

## 2021-07-21 DIAGNOSIS — R531 Weakness: Secondary | ICD-10-CM | POA: Diagnosis present

## 2021-07-21 DIAGNOSIS — E876 Hypokalemia: Secondary | ICD-10-CM | POA: Diagnosis not present

## 2021-07-21 DIAGNOSIS — I10 Essential (primary) hypertension: Secondary | ICD-10-CM | POA: Diagnosis not present

## 2021-07-21 LAB — BASIC METABOLIC PANEL
Anion gap: 11 (ref 5–15)
BUN: 12 mg/dL (ref 6–20)
CO2: 22 mmol/L (ref 22–32)
Calcium: 9.1 mg/dL (ref 8.9–10.3)
Chloride: 109 mmol/L (ref 98–111)
Creatinine, Ser: 0.92 mg/dL (ref 0.44–1.00)
GFR, Estimated: >60 mL/min (ref >60)
Glucose, Bld: 139 mg/dL — ABNORMAL HIGH (ref 70–99)
Potassium: 3 mmol/L — ABNORMAL LOW (ref 3.5–5.1)
Sodium: 142 mmol/L (ref 135–145)

## 2021-07-21 LAB — RESP PANEL BY RT-PCR (FLU A&B, COVID) ARPGX2
Influenza A by PCR: NEGATIVE
Influenza B by PCR: NEGATIVE
SARS Coronavirus 2 by RT PCR: NEGATIVE

## 2021-07-21 LAB — CBC WITH DIFFERENTIAL/PLATELET
Abs Immature Granulocytes: 0.03 10*3/uL (ref 0.00–0.07)
Basophils Absolute: 0 10*3/uL (ref 0.0–0.1)
Basophils Relative: 0 %
Eosinophils Absolute: 0 10*3/uL (ref 0.0–0.5)
Eosinophils Relative: 0 %
HCT: 39.9 % (ref 36.0–46.0)
Hemoglobin: 13.2 g/dL (ref 12.0–15.0)
Immature Granulocytes: 0 %
Lymphocytes Relative: 7 %
Lymphs Abs: 1 10*3/uL (ref 0.7–4.0)
MCH: 31.7 pg (ref 26.0–34.0)
MCHC: 33.1 g/dL (ref 30.0–36.0)
MCV: 95.7 fL (ref 80.0–100.0)
Monocytes Absolute: 0.4 10*3/uL (ref 0.1–1.0)
Monocytes Relative: 3 %
Neutro Abs: 12.4 10*3/uL — ABNORMAL HIGH (ref 1.7–7.7)
Neutrophils Relative %: 90 %
Platelets: 212 10*3/uL (ref 150–400)
RBC: 4.17 MIL/uL (ref 3.87–5.11)
RDW: 13.4 % (ref 11.5–15.5)
WBC: 13.9 10*3/uL — ABNORMAL HIGH (ref 4.0–10.5)
nRBC: 0 % (ref 0.0–0.2)

## 2021-07-21 LAB — TYPE AND SCREEN
ABO/RH(D): B POS
Antibody Screen: NEGATIVE

## 2021-07-21 MED ORDER — LACTATED RINGERS IV SOLN: INTRAVENOUS | Status: DC

## 2021-07-21 MED ORDER — POTASSIUM CHLORIDE CRYS ER 20 MEQ PO TBCR
60.0000 meq | EXTENDED_RELEASE_TABLET | Freq: Once | ORAL | Status: AC
  Administered 2021-07-21: 60 meq via ORAL
  Filled 2021-07-21: qty 3

## 2021-07-21 MED ORDER — LACTATED RINGERS IV BOLUS
1000.0000 mL | Freq: Once | INTRAVENOUS | Status: AC
  Administered 2021-07-21: 1000 mL via INTRAVENOUS

## 2021-07-21 NOTE — ED Provider Notes (Signed)
Emergency Medicine Provider Triage Evaluation Note  [name] , a 45 y.o. female  was evaluated in triage.  Patient with past medical history of CVA with left-sided deficits, anemia with history of blood transfusion.  Presents with progressive weakness starting yesterday.  States that it is worse today with difficulty getting out of bed this morning.  Also complains of lightheadedness and shortness of breath that began this morning.  Review of Systems  Positive: Lightheaded, weakness, shortness of breath Negative: Syncope, dizziness, visual changes, nausea, vomiting, hematochezia, hematemesis, melena, chest pain, palpitations, fever   Physical Exam  BP 117/67   Pulse 80   Temp 98.3 F (36.8 C) (Oral)   Resp 16   SpO2 99%  Gen:   Awake, no distress, alert and oriented  Resp:  Normal effort, no respiratory distress, lung sounds clear Cardiac: Regular rate and rhythm, no murmurs, pulses 2+ MSK:   Prior CVA with left-sided deficits.  4/5 strength in right upper and right lower extremity.  Paralysis of the left upper extremity which is baseline.  2/5 strength in left lower extremity which is baseline. Neuro:   Alert and oriented x3, no focal neurological deficits, sensorimotor function at baseline.  At neurological baseline since CVA.     Medical Decision Making  Medically screening exam initiated at 12:55 PM.  Appropriate orders placed.  [name] was informed that the remainder of the evaluation will be completed by another provider, this initial triage assessment does not replace that evaluation, and the importance of remaining in the ED until their evaluation is complete.     Mickie Hillier, PA-C 07/21/21 1310    Lacretia Leigh, MD 07/23/21 [phone]

## 2021-07-21 NOTE — ED Triage Notes (Signed)
Pt BIB EMS. Pt endorses weakness and fatigue since this morning. Pt recently had a heavy period. Pt has hx of blood transfusion for previous menses. Pt is pale.

## 2021-07-21 NOTE — ED Provider Notes (Signed)
[facility] DEPT Provider Note   CSN: [id number] Arrival date & time: 07/21/21  1236     History Chief Complaint  Patient presents with   Weakness    [name] is a 45 y.o. female.  45 year old female presents with increasing weakness that began today.  Does have a history of CVA with persistent left-sided deficits.  Patient just finished her menstrual cycle and states that it was very heavy.  Does have a history of anemia.  Has had some dyspnea on exertion.  Denies any cough or congestion.  No black or bloody stools.  Hematemesis.  No recent fever or chills      Past Medical History:  Diagnosis Date   Anemia    Anxiety    Arthritis    Takyasu Arthritis    Benign tumor of back    Carotid occlusion, left    DVT (deep venous thrombosis) (HCC)    GI bleed 04/2017   Hypertension    Menorrhagia    PVD (peripheral vascular disease) (HCC)    Stroke (cerebrum) (HCC)    Stroke (HCC)    left sided deficits   Swelling, lymph nodes 2006   intermittant, benign    Patient Active Problem List   Diagnosis Date Noted   Lower GI bleed 05/02/2017   History of DVT (deep vein thrombosis) 05/02/2017   GIB (gastrointestinal bleeding) 05/02/2017   Bleeding per rectum 04/26/2017   S/P IVC filter 04/26/2017   HLD (hyperlipidemia)    Spastic hemiplegia affecting nondominant side ([place]) 09/10/2013   Cognitive deficits, late effect of cerebrovascular disease [id number]   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   Postprocedural respiratory failure ([place]) 02/16/2013   Essential hypertension, malignant 02/16/2013   Sinus tachycardia 02/16/2013   History of CVA (cerebrovascular accident) 12/05/2012   Subtherapeutic international normalized ratio (INR) 12/05/2012   DEPRESSION 09/04/2006   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   CONSTIPATION 09/04/2006   OVARIAN CYST 09/04/2006   FOOT DROP, LEFT 09/04/2006    Past Surgical History:  Procedure Laterality Date    BREAST SURGERY     bil breast implants   COLONOSCOPY WITH PROPOFOL N/A 04/28/2017   Procedure: COLONOSCOPY WITH PROPOFOL;  Surgeon: Doran Stabler, MD;  Location: [facility];  Service: Endoscopy;  Laterality: N/A;   ivc filter placement     TrapEase IVC filter Safe 1.5T     OB History     Gravida  1   Para      Term      Preterm      AB      Living  1      SAB      IAB      Ectopic      Multiple      Live Births              Family History  Problem Relation Age of Onset   Cancer - Other Father    Coronary artery disease Mother    Stroke Mother    Hypertension Mother    Hypertension Sister    Colon cancer Neg Hx     Social History   Tobacco Use   Smoking status: Never   Smokeless tobacco: Never  Vaping Use   Vaping Use: Never used  Substance Use Topics   Alcohol use: No   Drug use: No    Home Medications Prior to Admission medications   Medication Sig Start Date End Date Taking? Authorizing Provider  aspirin EC 81 MG EC tablet Take 1 tablet (81 mg total) by mouth daily. 07/01/15  Yes Burgess Estelle, MD  benztropine (COGENTIN) 0.5 MG tablet Take 1 tablet (0.5 mg total) by mouth daily. 03/23/21 09/02/21 Yes Money, Lowry Ram, FNP  folic acid (FOLVITE) 1 MG tablet Take 1 mg by mouth daily.   Yes [provider]  hydrochlorothiazide (HYDRODIURIL) 25 MG tablet Take 12.5 mg by mouth daily. 12/21/20 12/22/21 Yes [provider]  traZODone (DESYREL) 50 MG tablet Take 1 tablet (50 mg total) by mouth at bedtime. Patient taking differently: Take 100 mg by mouth at bedtime. 03/23/21  Yes Money, Lowry Ram, FNP  CALCIUM CARBONATE-VITAMIN D PO Take 600 mg by mouth daily.     [provider]  fondaparinux (ARIXTRA) 7.5 MG/0.6ML SOLN injection Inject 7.5 mg into the skin every evening. 09/11/15   [provider]  Melatonin 5 MG TABS Take 5 mg by mouth at bedtime.     [provider]  Multiple Vitamin (MULTIVITAMIN) tablet  Take 1 tablet by mouth daily.    [provider]  omeprazole (PRILOSEC) 20 MG capsule Take 20 mg by mouth daily. 05/07/21   [provider]  polyethylene glycol powder (GLYCOLAX/MIRALAX) 17 GM/SCOOP powder Take 17 g by mouth daily. 04/13/21   [provider]  tocilizumab (ACTEMRA) 400 MG/20ML SOLN injection Inject 400 mg/kg into the vein once a week. Take on Saturday    [provider]  vitamin C (ASCORBIC ACID) 250 MG tablet Take 250 mg by mouth daily.    [provider]    Allergies    Pork-derived products  Review of Systems   Review of Systems  All other systems reviewed and are negative.  Physical Exam Updated Vital Signs BP 117/67   Pulse 80   Temp 98.3 F (36.8 C) (Oral)   Resp 16   SpO2 99%   Physical Exam Vitals and nursing note reviewed.  Constitutional:      General: She is not in acute distress.    Appearance: Normal appearance. She is well-developed. She is not toxic-appearing.  HENT:     Head: Normocephalic and atraumatic.  Eyes:     General: Lids are normal.     Conjunctiva/sclera: Conjunctivae normal.     Pupils: Pupils are equal, round, and reactive to light.  Neck:     Thyroid: No thyroid mass.     Trachea: No tracheal deviation.  Cardiovascular:     Rate and Rhythm: Normal rate and regular rhythm.     Heart sounds: Normal heart sounds. No murmur heard.   No gallop.  Pulmonary:     Effort: Pulmonary effort is normal. No respiratory distress.     Breath sounds: Normal breath sounds. No stridor. No decreased breath sounds, wheezing, rhonchi or rales.  Abdominal:     General: There is no distension.     Palpations: Abdomen is soft.     Tenderness: There is no abdominal tenderness. There is no rebound.  Musculoskeletal:        General: No tenderness. Normal range of motion.     Cervical back: Normal range of motion and neck supple.  Skin:    General: Skin is warm and dry.     Findings: No abrasion or rash.   Neurological:     Mental Status: She is alert and oriented to person, place, and time. Mental status is at baseline.     GCS: GCS eye subscore is 4. GCS  verbal subscore is 5. GCS motor subscore is 6.     Cranial Nerves: Cranial nerves are intact. No cranial nerve deficit.     Sensory: No sensory deficit.     Motor: Motor function is intact.  Psychiatric:        Attention and Perception: Attention normal.        Speech: Speech normal.        Behavior: Behavior normal.    ED Results / Procedures / Treatments   Labs (all labs ordered are listed, but only abnormal results are displayed) Labs Reviewed  CBC WITH DIFFERENTIAL/PLATELET  BASIC METABOLIC PANEL  TYPE AND SCREEN    EKG None  Radiology No results found.  Procedures Procedures   Medications Ordered in ED Medications - No data to display  ED Course  I have reviewed the triage vital signs and the nursing notes.  Pertinent labs & imaging results that were available during my care of the patient were reviewed by me and considered in my medical decision making (see chart for details).    MDM Rules/Calculators/A&P                           Patient presented with generalized weakness.  Labs are significant for mild hypokalemia which was treated with oral potassium.  Her COVID test was negative.  Hemoglobin stable.  Chest x-ray is normal.  Given IV fluids does feel better.  Will discharge home Final Clinical Impression(s) / ED Diagnoses Final diagnoses:  None    Rx / DC Orders ED Discharge Orders     None        Lacretia Leigh, MD 07/21/21 1640

## 2021-07-24 ENCOUNTER — Other Ambulatory Visit: Payer: Self-pay

## 2021-07-24 ENCOUNTER — Emergency Department (HOSPITAL_COMMUNITY): Payer: No Typology Code available for payment source

## 2021-07-24 ENCOUNTER — Emergency Department (HOSPITAL_COMMUNITY)
Admission: EM | Admit: 2021-07-24 | Discharge: 2021-07-24 | Disposition: A | Discharge status: Home / Self Care | Payer: No Typology Code available for payment source | Attending: Emergency Medicine | Admitting: Emergency Medicine

## 2021-07-24 ENCOUNTER — Encounter (HOSPITAL_COMMUNITY): Payer: Self-pay

## 2021-07-24 ENCOUNTER — Emergency Department (HOSPITAL_BASED_OUTPATIENT_CLINIC_OR_DEPARTMENT_OTHER)
Admit: 2021-07-24 | Discharge: 2021-07-24 | Disposition: A | Discharge status: Home / Self Care | Payer: No Typology Code available for payment source | Attending: Emergency Medicine | Admitting: Emergency Medicine

## 2021-07-24 DIAGNOSIS — I82401 Acute embolism and thrombosis of unspecified deep veins of right lower extremity: Secondary | ICD-10-CM | POA: Diagnosis not present

## 2021-07-24 DIAGNOSIS — Z7982 Long term (current) use of aspirin: Secondary | ICD-10-CM | POA: Diagnosis not present

## 2021-07-24 DIAGNOSIS — R609 Edema, unspecified: Secondary | ICD-10-CM | POA: Diagnosis not present

## 2021-07-24 DIAGNOSIS — R0602 Shortness of breath: Secondary | ICD-10-CM | POA: Insufficient documentation

## 2021-07-24 DIAGNOSIS — I10 Essential (primary) hypertension: Secondary | ICD-10-CM | POA: Diagnosis not present

## 2021-07-24 DIAGNOSIS — R531 Weakness: Secondary | ICD-10-CM | POA: Diagnosis not present

## 2021-07-24 DIAGNOSIS — Z79899 Other long term (current) drug therapy: Secondary | ICD-10-CM | POA: Diagnosis not present

## 2021-07-24 DIAGNOSIS — R2241 Localized swelling, mass and lump, right lower limb: Secondary | ICD-10-CM | POA: Diagnosis present

## 2021-07-24 LAB — CBC WITH DIFFERENTIAL/PLATELET
Abs Immature Granulocytes: 0.04 10*3/uL (ref 0.00–0.07)
Basophils Absolute: 0.1 10*3/uL (ref 0.0–0.1)
Basophils Relative: 0 %
Eosinophils Absolute: 0.3 10*3/uL (ref 0.0–0.5)
Eosinophils Relative: 2 %
HCT: 38.4 % (ref 36.0–46.0)
Hemoglobin: 13 g/dL (ref 12.0–15.0)
Immature Granulocytes: 0 %
Lymphocytes Relative: 14 %
Lymphs Abs: 1.9 10*3/uL (ref 0.7–4.0)
MCH: 32.2 pg (ref 26.0–34.0)
MCHC: 33.9 g/dL (ref 30.0–36.0)
MCV: 95 fL (ref 80.0–100.0)
Monocytes Absolute: 0.5 10*3/uL (ref 0.1–1.0)
Monocytes Relative: 4 %
Neutro Abs: 10.9 10*3/uL — ABNORMAL HIGH (ref 1.7–7.7)
Neutrophils Relative %: 80 %
Platelets: 184 10*3/uL (ref 150–400)
RBC: 4.04 MIL/uL (ref 3.87–5.11)
RDW: 13.5 % (ref 11.5–15.5)
WBC: 13.8 10*3/uL — ABNORMAL HIGH (ref 4.0–10.5)
nRBC: 0 % (ref 0.0–0.2)

## 2021-07-24 LAB — BASIC METABOLIC PANEL
Anion gap: 12 (ref 5–15)
BUN: 10 mg/dL (ref 6–20)
CO2: 26 mmol/L (ref 22–32)
Calcium: 9.9 mg/dL (ref 8.9–10.3)
Chloride: 103 mmol/L (ref 98–111)
Creatinine, Ser: 0.89 mg/dL (ref 0.44–1.00)
GFR, Estimated: >60 mL/min (ref >60)
Glucose, Bld: 112 mg/dL — ABNORMAL HIGH (ref 70–99)
Potassium: 3.4 mmol/L — ABNORMAL LOW (ref 3.5–5.1)
Sodium: 141 mmol/L (ref 135–145)

## 2021-07-24 LAB — TROPONIN I (HIGH SENSITIVITY)
Troponin I (High Sensitivity): 3 ng/L (ref <18)
Troponin I (High Sensitivity): 3 ng/L (ref <18)

## 2021-07-24 MED ORDER — IOHEXOL 350 MG/ML SOLN
75.0000 mL | Freq: Once | INTRAVENOUS | Status: AC | PRN
  Administered 2021-07-24: 75 mL via INTRAVENOUS

## 2021-07-24 MED ORDER — ENOXAPARIN SODIUM 100 MG/ML IJ SOSY: 90.0000 mg | PREFILLED_SYRINGE | Freq: Two times a day (BID) | INTRAMUSCULAR | 0 refills | Status: AC

## 2021-07-24 MED ORDER — ENOXAPARIN SODIUM 100 MG/ML IJ SOSY
90.0000 mg | PREFILLED_SYRINGE | Freq: Once | INTRAMUSCULAR | Status: AC
  Administered 2021-07-24: 90 mg via SUBCUTANEOUS
  Filled 2021-07-24: qty 0.9

## 2021-07-24 MED ORDER — ENOXAPARIN SODIUM 100 MG/ML IJ SOSY: 90.0000 mg | PREFILLED_SYRINGE | Freq: Two times a day (BID) | INTRAMUSCULAR | Status: DC

## 2021-07-24 NOTE — ED Triage Notes (Signed)
Patient reports that she was in the ED 3 days ago for weakness. Patient states after discharge she began having right leg swelling. Patient reports a history of DVT. Patient states she is only taking Aspirin now.

## 2021-07-24 NOTE — Progress Notes (Signed)
ANTICOAGULATION CONSULT NOTE - Initial Consult  Pharmacy Consult for Enoxaparin Indication: VTE  Allergies  Allergen Reactions   Pork-Derived Products Other (See Comments)    Does not eat pork at all (agreeable to use Lovenox 08/11/13)    Patient Measurements: Height: '5\' 9"'$  (175.3 cm) Weight: 88.5 kg (195 lb) IBW/kg (Calculated) : 66.2 Heparin Dosing Weight:   Vital Signs: Temp: 98.2 F (36.8 C) (08/29 1205) Temp Source: Oral (08/29 1205) BP: 138/96 (08/29 1458) Pulse Rate: 98 (08/29 1458)  Labs: Recent Labs    07/24/21 1537  HGB 13.0  HCT 38.4  PLT 184    Estimated Creatinine Clearance: 90.6 mL/min (by C-G formula based on SCr of 0.92 mg/dL).   Medical History: Past Medical History:  Diagnosis Date   Anemia    Anxiety    Arthritis    Takyasu Arthritis    Benign tumor of back    Carotid occlusion, left    DVT (deep venous thrombosis) (HCC)    GI bleed 04/2017   Hypertension    Menorrhagia    PVD (peripheral vascular disease) (HCC)    Stroke (cerebrum) (HCC)    Stroke (HCC)    left sided deficits   Swelling, lymph nodes 2006   intermittant, benign    Medications:  Scheduled:   enoxaparin (LOVENOX) injection  90 mg Subcutaneous Once   Infusions:   Assessment: 30 yoF presents to ED on 8/29 with acute DVT.   PMH of anticoagulation for history of DVT, CVA, and lupus anticoagulant.  She was previously taking warfarin (2014), was on Xarelto briefly (03/20/2013-03/29/2013) before being changed back to Warfarin, then was prescribed Fondaparinux (2016).  She also has history of GIB and anemia in 2018, IVC filter placed (2018), and anticoagulation with Fondaparinux continued.  No current bleeding or anemia noted.  She thinks she hasn't taken the Fondaparinux in years and reports currently only taking Aspirin. Documentation shows that while she does not eat pork at all, she is agreeable to use Lovenox (08/11/13).  Today, 07/24/2021:  SCr pending (last SCr 0.92, on  8/26), CrCl ~ 90 ml/min CBC:  Hgb 13, Plt 184 8/29 [place]:  acute DVT involving the right common femoral vein, SF junction, right femoral vein, right proximal profunda vein, right popliteal vein, right posterior tibial veins, right peroneal veins, and right gastrocnemius veins.    Goal of Therapy:  Anti-Xa level 0.6-1 units/ml 4hrs after LMWH dose given Monitor platelets by anticoagulation protocol: Yes   Plan:  Enoxaparin 1 mg/kg ('90mg'$ ) Echo q12h Continue to monitor SCr, CBC   Gretta Arab PharmD, BCPS Clinical Pharmacist [facility] main pharmacy [phone] 07/24/2021 4:01 PM

## 2021-07-24 NOTE — ED Provider Notes (Signed)
Emergency Medicine Provider Triage Evaluation Note  [name] , a 45 y.o. female  was evaluated in triage.  Pt complains of rle swelling that started after she was discharged from the hospital 3 days ago. Reports sob as well. Denies chest pain. Hx dvt, currently only on asa.  Review of Systems  Positive: Rle swelling, leg pain, sob Negative: Chest pain  Physical Exam  BP 137/82 (BP Location: Right Arm)   Pulse 90   Temp 98.2 F (36.8 C) (Oral)   Resp 18   Ht '5\' 9"'$  (1.753 m)   Wt 88.5 kg   SpO2 100%   BMI 28.80 kg/m  Gen:   Awake, no distress   Resp:  Normal effort  MSK:   Moves extremities without difficulty  Other:  3+ pitting edema to the rle  Medical Decision Making  Medically screening exam initiated at 12:47 PM.  Appropriate orders placed.  [name] was informed that the remainder of the evaluation will be completed by another provider, this initial triage assessment does not replace that evaluation, and the importance of remaining in the ED until their evaluation is complete.     Bishop Dublin 07/24/21 1248    Lacretia Leigh, MD 07/25/21 [phone]

## 2021-07-24 NOTE — Progress Notes (Signed)
Right lower extremity venous duplex has been completed. Preliminary results can be found in CV Proc through chart review.  Results were given to [employer] PA.  07/24/21 1:18 PM [name] RVT

## 2021-07-24 NOTE — Discharge Instructions (Addendum)
You are seen in the emergency department for right leg swelling and pain.  You had an ultrasound that showed an acute blood clot in your deep veins.  You did not have any evidence of a blood clot to your lung.  We started you on your first dose of Lovenox tonight.  You will need to be on it twice a day.  Please go to the [place] tomorrow for follow-up and confirmation of this is the appropriate treatment for you.  Included below is the report of your ultrasound.  Summary:  RIGHT:  - Findings consistent with acute deep vein thrombosis involving the right common femoral vein, SF junction, right femoral vein, right proximal profunda vein, right popliteal vein, right posterior tibial veins, right peroneal veins, and right   gastrocnemius veins.  - No cystic structure found in the popliteal fossa.     LEFT:  - No evidence of common femoral vein obstruction.

## 2021-07-24 NOTE — ED Provider Notes (Signed)
[name] DEPT Provider Note   CSN: [id number] Arrival date & time: 07/24/21  1105     History Chief Complaint  Patient presents with   Leg Swelling    [name] is a 45 y.o. female.  She has a prior history of stroke with left-sided deficits, history of DVT currently not on anticoagulation.  She was here few days ago for generalized weakness and found to have low potassium.  Since discharge she has noticed some increased swelling and discomfort in her right leg.  She is also noticed some shortness of breath with exertion.  No fevers or chills.  No chest pain or abdominal pain.  No recent trauma.  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  She was on fondaparinux in the past although unclear when she stopped it, she thinks years.  She only takes an aspirin for anticoagulation.  She gets most of her care through the [place] so records are limited  The history is provided by the patient.  Leg Pain Location:  Leg Time since incident:  4 days Injury: no   Leg location:  R leg Pain details:    Quality:  Aching   Severity:  Moderate   Onset quality:  Gradual   Duration:  4 days   Timing:  Constant   Progression:  Worsening Chronicity:  New Relieved by:  None tried Worsened by:  Activity Ineffective treatments:  Rest Associated symptoms: swelling   Associated symptoms: no back pain and no fever       Past Medical History:  Diagnosis Date   Anemia    Anxiety    Arthritis    Takyasu Arthritis    Benign tumor of back    Carotid occlusion, left    DVT (deep venous thrombosis) (HCC)    GI bleed 04/2017   Hypertension    Menorrhagia    PVD (peripheral vascular disease) ([place])    Stroke (cerebrum) (HCC)    Stroke (HCC)    left sided deficits   Swelling, lymph nodes 2006   intermittant, benign    Patient Active Problem List   Diagnosis Date Noted   Lower GI bleed 05/02/2017   History of DVT (deep vein thrombosis) 05/02/2017    GIB (gastrointestinal bleeding) 05/02/2017   Bleeding per rectum 04/26/2017   S/P IVC filter 04/26/2017   HLD (hyperlipidemia)    Spastic hemiplegia affecting nondominant side ([place]) 09/10/2013   Cognitive deficits, late effect of cerebrovascular disease [id number]   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   Postprocedural respiratory failure ([place]) 02/16/2013   Essential hypertension, malignant 02/16/2013   Sinus tachycardia 02/16/2013   History of CVA (cerebrovascular accident) 12/05/2012   Subtherapeutic international normalized ratio (INR) 12/05/2012   DEPRESSION 09/04/2006   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   CONSTIPATION 09/04/2006   OVARIAN CYST 09/04/2006   FOOT DROP, LEFT 09/04/2006    Past Surgical History:  Procedure Laterality Date   BREAST SURGERY     bil breast implants   COLONOSCOPY WITH PROPOFOL N/A 04/28/2017   Procedure: COLONOSCOPY WITH PROPOFOL;  Surgeon: Doran Stabler, MD;  Location: [facility];  Service: Endoscopy;  Laterality: N/A;   ivc filter placement     TrapEase IVC filter Safe 1.5T     OB History     Gravida  1   Para      Term      Preterm      AB  Living  1      SAB      IAB      Ectopic      Multiple      Live Births              Family History  Problem Relation Age of Onset   Cancer - Other Father    Coronary artery disease Mother    Stroke Mother    Hypertension Mother    Hypertension Sister    Colon cancer Neg Hx     Social History   Tobacco Use   Smoking status: Never   Smokeless tobacco: Never  Vaping Use   Vaping Use: Never used  Substance Use Topics   Alcohol use: No   Drug use: No    Home Medications Prior to Admission medications   Medication Sig Start Date End Date Taking? Authorizing Provider  aspirin EC 81 MG EC tablet Take 1 tablet (81 mg total) by mouth daily. 07/01/15  Yes Burgess Estelle, MD  benztropine (COGENTIN) 0.5 MG tablet Take 1 tablet (0.5 mg total) by mouth daily. 03/23/21  09/02/21  Money, Lowry Ram, FNP  BIOTIN PO Take 1 tablet by mouth daily.    [provider]  folic acid (FOLVITE) 1 MG tablet Take 1 mg by mouth daily.    [provider]  hydrochlorothiazide (HYDRODIURIL) 25 MG tablet Take 12.5 mg by mouth daily. 12/21/20 12/22/21  [provider]  omeprazole (PRILOSEC) 20 MG capsule Take 20 mg by mouth daily. 05/07/21   [provider]  polyethylene glycol powder (GLYCOLAX/MIRALAX) 17 GM/SCOOP powder Take 17 g by mouth daily. 04/13/21   [provider]  risperiDONE (RISPERDAL) 3 MG tablet Take 3 mg by mouth at bedtime.    [provider]  traZODone (DESYREL) 50 MG tablet Take 1 tablet (50 mg total) by mouth at bedtime. Patient taking differently: Take 100 mg by mouth at bedtime. 03/23/21   Money, Lowry Ram, FNP  vitamin C (ASCORBIC ACID) 250 MG tablet Take 250 mg by mouth daily.    [provider]    Allergies    Pork-derived products  Review of Systems   Review of Systems  Constitutional:  Negative for fever.  HENT:  Negative for sore throat.   Eyes:  Negative for visual disturbance.  Respiratory:  Positive for shortness of breath.   Cardiovascular:  Positive for leg swelling. Negative for chest pain.  Gastrointestinal:  Negative for abdominal pain.  Genitourinary:  Negative for dysuria.  Musculoskeletal:  Negative for back pain.  Skin:  Negative for rash.  Neurological:  Negative for headaches.   Physical Exam Updated Vital Signs BP (!) 138/96   Pulse 98   Temp 98.2 F (36.8 C) (Oral)   Resp 18   Ht '5\' 9"'$  (1.753 m)   Wt 88.5 kg   SpO2 100%   BMI 28.80 kg/m   Physical Exam Vitals and nursing note reviewed.  Constitutional:      General: She is not in acute distress.    Appearance: Normal appearance. She is well-developed.  HENT:     Head: Normocephalic and atraumatic.  Eyes:     Conjunctiva/sclera: Conjunctivae normal.  Cardiovascular:     Rate and Rhythm: Normal rate and  regular rhythm.     Heart sounds: No murmur heard. Pulmonary:     Effort: Pulmonary effort is normal. No respiratory distress.     Breath sounds: Normal breath sounds.  Abdominal:  Palpations: Abdomen is soft.     Tenderness: There is no abdominal tenderness. There is no guarding or rebound.  Musculoskeletal:        General: Swelling and tenderness present.     Cervical back: Neck supple.     Right lower leg: Edema present.  Skin:    General: Skin is warm and dry.  Neurological:     Mental Status: She is alert. Mental status is at baseline.     Comments: She has some baseline decrease use of her left arm    ED Results / Procedures / Treatments   Labs (all labs ordered are listed, but only abnormal results are displayed) Labs Reviewed  CBC WITH DIFFERENTIAL/PLATELET - Abnormal; Notable for the following components:      Result Value   WBC 13.8 (*)    Neutro Abs 10.9 (*)    All other components within normal limits  BASIC METABOLIC PANEL - Abnormal; Notable for the following components:   Potassium 3.4 (*)    Glucose, Bld 112 (*)    All other components within normal limits  RESP PANEL BY RT-PCR (FLU A&B, COVID) [id number]  TROPONIN I (HIGH SENSITIVITY)  TROPONIN I (HIGH SENSITIVITY)    EKG None  Radiology DG Chest 2 View  Result Date: 07/24/2021 CLINICAL DATA:  Shortness of breath EXAM: CHEST - 2 VIEW COMPARISON:  Chest radiograph 07/21/2021 FINDINGS: The cardiomediastinal silhouette is stable. The lungs clear, with no focal consolidation or pulmonary edema. There is no pleural effusion or pneumothorax. There is no acute osseous abnormality. IMPRESSION: No radiographic evidence of acute cardiopulmonary process. Electronically Signed   By: Valetta Mole M.D.   On: 07/24/2021 13:51   VAS [place] LOWER EXTREMITY VENOUS (DVT) (ONLY [facility] & [facility] 7a-7p)  Result Date: 07/24/2021  Lower Venous DVT Study Patient Name:  TINISHIA BALEK  Date of Exam:   07/24/2021 Medical Rec #: [id number]      Accession #:    [id number] Date of Birth: 1975/12/23    Patient Gender: F Patient Age:   52 years Exam Location:  [facility] Procedure:      VAS [place] LOWER EXTREMITY VENOUS (DVT) Referring Phys: Marijean Bravo COUTURE --------------------------------------------------------------------------------  Indications: Edema.  Risk Factors: None identified. Limitations: Poor ultrasound/tissue interface. Comparison Study: No prior studies. Performing Technologist: Oliver Hum RVT  Examination Guidelines: A complete evaluation includes B-mode imaging, spectral Doppler, color Doppler, and power Doppler as needed of all accessible portions of each vessel. Bilateral testing is considered an integral part of a complete examination. Limited examinations for reoccurring indications may be performed as noted. The reflux portion of the exam is performed with the patient in reverse Trendelenburg.  +---------+---------------+---------+-----------+----------+--------------+ RIGHT    CompressibilityPhasicitySpontaneityPropertiesThrombus Aging +---------+---------------+---------+-----------+----------+--------------+ CFV      None           No       No                   Acute          +---------+---------------+---------+-----------+----------+--------------+ SFJ      None                                         Acute          +---------+---------------+---------+-----------+----------+--------------+ FV Prox  None           No  No                   Acute          +---------+---------------+---------+-----------+----------+--------------+ FV Mid   None           No       No                   Acute          +---------+---------------+---------+-----------+----------+--------------+ FV DistalNone           No       No                   Acute          +---------+---------------+---------+-----------+----------+--------------+ PFV      None           No       No                   Acute           +---------+---------------+---------+-----------+----------+--------------+ POP      None           No       No                   Acute          +---------+---------------+---------+-----------+----------+--------------+ PTV      Partial                                      Acute          +---------+---------------+---------+-----------+----------+--------------+ PERO     None                                         Acute          +---------+---------------+---------+-----------+----------+--------------+ Gastroc  None                                         Acute          +---------+---------------+---------+-----------+----------+--------------+ EIV                     Yes      Yes                                 +---------+---------------+---------+-----------+----------+--------------+ The external iliac vein appears patient.  +----+---------------+---------+-----------+----------+--------------+ LEFTCompressibilityPhasicitySpontaneityPropertiesThrombus Aging +----+---------------+---------+-----------+----------+--------------+ CFV Full           Yes      Yes                                 +----+---------------+---------+-----------+----------+--------------+     Summary: RIGHT: - Findings consistent with acute deep vein thrombosis involving the right common femoral vein, SF junction, right femoral vein, right proximal profunda vein, right popliteal vein, right posterior tibial veins, right peroneal veins, and right gastrocnemius veins. - No cystic structure found in the popliteal fossa.  LEFT: - No evidence of common femoral vein obstruction.  *See table(s) above for measurements and observations.  Preliminary     Procedures Procedures   Medications Ordered in ED Medications  enoxaparin (LOVENOX) injection 90 mg (90 mg Subcutaneous Given 07/24/21 1727)  iohexol (OMNIPAQUE) 350 MG/ML injection 75 mL (75 mLs Intravenous Contrast Given 07/24/21  1644)    ED Course  I have reviewed the triage vital signs and the nursing notes.  Pertinent labs & imaging results that were available during my care of the patient were reviewed by me and considered in my medical decision making (see chart for details).  Clinical Course as of 07/25/21 1118  Mon Jul 24, 2021  1755 Patient states she used to be on Coumadin but had a stroke on that so she was using Lovenox shots in the past.  She is only taking aspirin now.  Looking through her records she does have a history of IVC filter.  She feels like she can manage her symptoms at home and is comfortable giving herself the shots. [MB]  [id number] Patient's husband is here now and I reviewed his work-up with him.  He said he can go to the [facility] [place] tomorrow to fill the prescription of the medication.  Return instructions discussed [MB]    Clinical Course User Index [MB] Hayden Rasmussen, MD   MDM Rules/Calculators/A&P                          This patient complains of right leg swelling and pain shortness of breath; this involves an extensive number of treatment Options and is a complaint that carries with it a high risk of complications and Morbidity. The differential includes DVT, peripheral edema, PE  I ordered, reviewed and interpreted labs, which included CBC with mildly elevated white count, stable hemoglobin, chemistries with mildly low potassium, troponins flat I ordered medication subcu Lovenox I ordered imaging studies which included duplex right lower extremity and CT angiogram and I independently    visualized and interpreted imaging which showed acute DVT right lower extremity, no evidence of PE Previous records obtained and reviewed in epic  After the interventions stated above, I reevaluated the patient and found patient to be hemodynamically stable.  She feels that she can manage her symptoms at home and with her [place] providers.  Provided prescription for Lovenox.  She is planning on taking this to  the [facility] tomorrow for reassessment and filling of the prescription.  Due to the lack of medical records I am not sure why she is not on a DOAC.  I am asking her to confirm with her providers that Lovenox is the appropriate management for her condition.  Return instructions discussed   Final Clinical Impression(s) / ED Diagnoses Final diagnoses:  Leg DVT (deep venous thromboembolism), acute, right ([place])    Rx / DC Orders ED Discharge Orders          Ordered    enoxaparin (LOVENOX) 100 MG/ML injection  Every 12 hours        07/24/21 1830             Hayden Rasmussen, MD 07/25/21 1122

## 2021-08-31 ENCOUNTER — Encounter (HOSPITAL_COMMUNITY): Payer: Self-pay | Admitting: *Deleted

## 2021-08-31 ENCOUNTER — Emergency Department (HOSPITAL_COMMUNITY): Payer: No Typology Code available for payment source

## 2021-08-31 ENCOUNTER — Emergency Department (HOSPITAL_COMMUNITY)
Admission: EM | Admit: 2021-08-31 | Discharge: 2021-08-31 | Discharge status: Against Medical Advice | Payer: No Typology Code available for payment source | Attending: Emergency Medicine | Admitting: Emergency Medicine

## 2021-08-31 DIAGNOSIS — Z7982 Long term (current) use of aspirin: Secondary | ICD-10-CM | POA: Insufficient documentation

## 2021-08-31 DIAGNOSIS — R600 Localized edema: Secondary | ICD-10-CM | POA: Insufficient documentation

## 2021-08-31 DIAGNOSIS — Z79899 Other long term (current) drug therapy: Secondary | ICD-10-CM | POA: Diagnosis not present

## 2021-08-31 DIAGNOSIS — M79674 Pain in right toe(s): Secondary | ICD-10-CM

## 2021-08-31 DIAGNOSIS — I10 Essential (primary) hypertension: Secondary | ICD-10-CM | POA: Insufficient documentation

## 2021-08-31 NOTE — ED Triage Notes (Signed)
Pt complains of bilateral leg swelling, worse in right leg x 1 month. Hx of DVT, is taking eliquis.

## 2021-08-31 NOTE — ED Provider Notes (Signed)
[facility] DEPT Provider Note   CSN: [id number] Arrival date & time: 08/31/21  1057     History Chief Complaint  Patient presents with   Leg Swelling    [name] is a 45 y.o. female.  46 year old female presents with increased edema right lower extremity.  Seen by myself over a month ago and diagnosed with DVT.  Patient states that she takes Eliquis and has been compliant.  She also has an IVC filter in.  Denies any chest pain or shortness of breath.  No pleurisy.  Called the [facility] [place] and was told to come here      Past Medical History:  Diagnosis Date   Anemia    Anxiety    Arthritis    Takyasu Arthritis    Benign tumor of back    Carotid occlusion, left    DVT (deep venous thrombosis) (HCC)    GI bleed 04/2017   Hypertension    Menorrhagia    PVD (peripheral vascular disease) ([place])    Stroke (cerebrum) (HCC)    Stroke (HCC)    left sided deficits   Swelling, lymph nodes 2006   intermittant, benign    Patient Active Problem List   Diagnosis Date Noted   Lower GI bleed 05/02/2017   History of DVT (deep vein thrombosis) 05/02/2017   GIB (gastrointestinal bleeding) 05/02/2017   Bleeding per rectum 04/26/2017   S/P IVC filter 04/26/2017   HLD (hyperlipidemia)    Spastic hemiplegia affecting nondominant side ([place]) 09/10/2013   Cognitive deficits, late effect of cerebrovascular disease 95/18/8416   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   Postprocedural respiratory failure ([place]) 02/16/2013   Essential hypertension, malignant 02/16/2013   Sinus tachycardia 02/16/2013   History of CVA (cerebrovascular accident) 12/05/2012   Subtherapeutic international normalized ratio (INR) 12/05/2012   DEPRESSION 09/04/2006   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   CONSTIPATION 09/04/2006   OVARIAN CYST 09/04/2006   FOOT DROP, LEFT 09/04/2006    Past Surgical History:  Procedure Laterality Date   BREAST SURGERY     bil breast implants   COLONOSCOPY  WITH PROPOFOL N/A 04/28/2017   Procedure: COLONOSCOPY WITH PROPOFOL;  Surgeon: Doran Stabler, MD;  Location: [facility];  Service: Endoscopy;  Laterality: N/A;   ivc filter placement     TrapEase IVC filter Safe 1.5T     OB History     Gravida  1   Para      Term      Preterm      AB      Living  1      SAB      IAB      Ectopic      Multiple      Live Births              Family History  Problem Relation Age of Onset   Cancer - Other Father    Coronary artery disease Mother    Stroke Mother    Hypertension Mother    Hypertension Sister    Colon cancer Neg Hx     Social History   Tobacco Use   Smoking status: Never   Smokeless tobacco: Never  Vaping Use   Vaping Use: Never used  Substance Use Topics   Alcohol use: No   Drug use: No    Home Medications Prior to Admission medications   Medication Sig Start Date End Date Taking? Authorizing Provider  aspirin EC 81 MG EC  tablet Take 1 tablet (81 mg total) by mouth daily. 07/01/15   Burgess Estelle, MD  benztropine (COGENTIN) 0.5 MG tablet Take 1 tablet (0.5 mg total) by mouth daily. 03/23/21 09/02/21  Money, Lowry Ram, FNP  BIOTIN PO Take 1 tablet by mouth daily.    [provider]  enoxaparin (LOVENOX) 100 MG/ML injection Inject 0.9 mLs (90 mg total) into the skin every 12 (twelve) hours. 07/24/21   Hayden Rasmussen, MD  folic acid (FOLVITE) 1 MG tablet Take 1 mg by mouth daily.    [provider]  hydrochlorothiazide (HYDRODIURIL) 25 MG tablet Take 12.5 mg by mouth daily. 12/21/20 12/22/21  [provider]  omeprazole (PRILOSEC) 20 MG capsule Take 20 mg by mouth daily as needed (acid reflux). 05/07/21   [provider]  polyethylene glycol powder (GLYCOLAX/MIRALAX) 17 GM/SCOOP powder Take 17 g by mouth daily as needed for mild constipation. 04/13/21   [provider]  risperiDONE (RISPERDAL) 1 MG tablet Take 3 mg by mouth at bedtime.    [provider]   traZODone (DESYREL) 50 MG tablet Take 1 tablet (50 mg total) by mouth at bedtime. Patient taking differently: Take 100 mg by mouth at bedtime. 03/23/21   Money, Lowry Ram, FNP  vitamin C (ASCORBIC ACID) 250 MG tablet Take 250 mg by mouth daily.    [provider]    Allergies    Pork-derived products  Review of Systems   Review of Systems  All other systems reviewed and are negative.  Physical Exam Updated Vital Signs BP 121/75 (BP Location: Left Arm)   Pulse 74   Temp 98.7 F (37.1 C) (Oral)   Resp 16   SpO2 99%   Physical Exam Vitals and nursing note reviewed.  Constitutional:      General: She is not in acute distress.    Appearance: Normal appearance. She is well-developed. She is not toxic-appearing.  HENT:     Head: Normocephalic and atraumatic.  Eyes:     General: Lids are normal.     Conjunctiva/sclera: Conjunctivae normal.     Pupils: Pupils are equal, round, and reactive to light.  Neck:     Thyroid: No thyroid mass.     Trachea: No tracheal deviation.  Cardiovascular:     Rate and Rhythm: Normal rate and regular rhythm.     Heart sounds: Normal heart sounds. No murmur heard.   No gallop.  Pulmonary:     Effort: Pulmonary effort is normal. No respiratory distress.     Breath sounds: Normal breath sounds. No stridor. No decreased breath sounds, wheezing, rhonchi or rales.  Abdominal:     General: There is no distension.     Palpations: Abdomen is soft.     Tenderness: There is no abdominal tenderness. There is no rebound.  Musculoskeletal:        General: No tenderness. Normal range of motion.     Cervical back: Normal range of motion and neck supple.  Feet:     Comments: Edema to right foot noted.  Neurovascular intact Skin:    General: Skin is warm and dry.     Findings: No abrasion or rash.  Neurological:     Mental Status: She is alert and oriented to person, place, and time. Mental status is at baseline.     GCS: GCS eye subscore is 4.  GCS verbal subscore is 5. GCS motor subscore is 6.     Cranial Nerves: Cranial nerves are  intact. No cranial nerve deficit.     Sensory: No sensory deficit.     Motor: Motor function is intact.  Psychiatric:        Attention and Perception: Attention normal.        Speech: Speech normal.        Behavior: Behavior normal.    ED Results / Procedures / Treatments   Labs (all labs ordered are listed, but only abnormal results are displayed) Labs Reviewed  I-STAT CHEM 8, ED    EKG None  Radiology No results found.  Procedures Procedures   Medications Ordered in ED Medications - No data to display  ED Course  I have reviewed the triage vital signs and the nursing notes.  Pertinent labs & imaging results that were available during my care of the patient were reviewed by me and considered in my medical decision making (see chart for details).    MDM Rules/Calculators/A&P                           Attempted to call patient back to review her results with her and patient has eloped Final Clinical Impression(s) / ED Diagnoses Final diagnoses:  None    Rx / DC Orders ED Discharge Orders     None        Lacretia Leigh, MD 08/31/21 1334

## 2021-08-31 NOTE — ED Provider Notes (Signed)
Emergency Medicine Provider Triage Evaluation Note  [name] , a 45 y.o. female  was evaluated in triage.  Pt complains of right leg swelling and 2nd toe pain. Diagnosed with DVT in right leg 1 month ago, reports worsening swelling in the foot and ankle with occasional sharp pain in the 2nd toe. Taking Aleve for pain with limited relief. No toe pain at this time. Uses a wheelchair, more often now due to leg swelling and foot pain  Review of Systems  Positive: Toe pain, ankle swelling Negative: CP  Physical Exam  BP 121/75 (BP Location: Left Arm)   Pulse 74   Temp 98.7 F (37.1 C) (Oral)   Resp 16   SpO2 99%  Gen:   Awake, no distress   Resp:  Normal effort  MSK:   Moves extremities without difficulty  Other:  Pitting edema right foot/ankle, no calf pain, cap refill present each toe, skin in tact  Medical Decision Making  Medically screening exam initiated at 11:40 AM.  Appropriate orders placed.  AVANNA SOWDER was informed that the remainder of the evaluation will be completed by another provider, this initial triage assessment does not replace that evaluation, and the importance of remaining in the ED until their evaluation is complete.     Tacy Learn, PA-C 08/31/21 1141    Hayden Rasmussen, MD 08/31/21 [phone]

## 2021-12-09 ENCOUNTER — Encounter (HOSPITAL_COMMUNITY): Payer: Self-pay | Admitting: *Deleted

## 2021-12-09 ENCOUNTER — Other Ambulatory Visit: Payer: Self-pay

## 2021-12-09 ENCOUNTER — Ambulatory Visit (HOSPITAL_COMMUNITY)
Admission: EM | Admit: 2021-12-09 | Discharge: 2021-12-09 | Disposition: A | Discharge status: Home / Self Care | Payer: 59 | Attending: Family Medicine | Admitting: Family Medicine

## 2021-12-09 DIAGNOSIS — Z1152 Encounter for screening for COVID-19: Secondary | ICD-10-CM | POA: Diagnosis present

## 2021-12-09 DIAGNOSIS — J069 Acute upper respiratory infection, unspecified: Secondary | ICD-10-CM | POA: Insufficient documentation

## 2021-12-09 LAB — SARS CORONAVIRUS 2 (TAT 6-24 HRS): SARS Coronavirus 2: POSITIVE — AB

## 2021-12-09 LAB — POC INFLUENZA A AND B ANTIGEN (URGENT CARE ONLY)
INFLUENZA A ANTIGEN, POC: NEGATIVE
INFLUENZA B ANTIGEN, POC: NEGATIVE

## 2021-12-09 MED ORDER — ACETAMINOPHEN 325 MG PO TABS
ORAL_TABLET | ORAL | Status: AC
  Filled 2021-12-09: qty 2

## 2021-12-09 MED ORDER — ACETAMINOPHEN 325 MG PO TABS
650.0000 mg | ORAL_TABLET | Freq: Once | ORAL | Status: AC
  Administered 2021-12-09: 650 mg via ORAL

## 2021-12-09 MED ORDER — ONDANSETRON 8 MG PO TBDP: 8.0000 mg | ORAL_TABLET | Freq: Three times a day (TID) | ORAL | 0 refills | Status: DC | PRN

## 2021-12-09 NOTE — ED Provider Notes (Signed)
[place]    CSN: [id number] Arrival date & time: 12/09/21  1041      History   Chief Complaint Chief Complaint  Patient presents with   Cough   Sore Throat   Chills    HPI [name] is a 46 y.o. female presenting with flulike illness x2 days. Medical history DVT, stroke. Denies history pulm ds. Describes 2 days of subjective chills, nonproductive cough, ST. Denies n/v/d/c. Generalized myalgias but not bothersome. Denies sick contacts. Denies SOB, CP, dizziness, weakness. Hasn't attempted medications at home.  HPI  Past Medical History:  Diagnosis Date   Anemia    Anxiety    Arthritis    Takyasu Arthritis    Benign tumor of back    Carotid occlusion, left    DVT (deep venous thrombosis) (HCC)    GI bleed 04/2017   Hypertension    Menorrhagia    PVD (peripheral vascular disease) (HCC)    Stroke (cerebrum) (HCC)    Stroke (HCC)    left sided deficits   Swelling, lymph nodes 2006   intermittant, benign    Patient Active Problem List   Diagnosis Date Noted   Lower GI bleed 05/02/2017   History of DVT (deep vein thrombosis) 05/02/2017   GIB (gastrointestinal bleeding) 05/02/2017   Bleeding per rectum 04/26/2017   S/P IVC filter 04/26/2017   HLD (hyperlipidemia)    Spastic hemiplegia affecting nondominant side ([place]) 09/10/2013   Cognitive deficits, late effect of cerebrovascular disease 70/62/3762   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   Postprocedural respiratory failure ([place]) 02/16/2013   Essential hypertension, malignant 02/16/2013   Sinus tachycardia 02/16/2013   History of CVA (cerebrovascular accident) 12/05/2012   Subtherapeutic international normalized ratio (INR) 12/05/2012   DEPRESSION 09/04/2006   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   CONSTIPATION 09/04/2006   OVARIAN CYST 09/04/2006   FOOT DROP, LEFT 09/04/2006    Past Surgical History:  Procedure Laterality Date   BREAST SURGERY     bil breast implants   COLONOSCOPY WITH PROPOFOL  N/A 04/28/2017   Procedure: COLONOSCOPY WITH PROPOFOL;  Surgeon: Doran Stabler, MD;  Location: [facility];  Service: Endoscopy;  Laterality: N/A;   ivc filter placement     TrapEase IVC filter Safe 1.5T    OB History     Gravida  1   Para      Term      Preterm      AB      Living  1      SAB      IAB      Ectopic      Multiple      Live Births               Home Medications    Prior to Admission medications   Medication Sig Start Date End Date Taking? Authorizing Provider  ondansetron (ZOFRAN-ODT) 8 MG disintegrating tablet Take 1 tablet (8 mg total) by mouth every 8 (eight) hours as needed for nausea or vomiting. 12/09/21  Yes Hazel Sams, PA-C  aspirin EC 81 MG EC tablet Take 1 tablet (81 mg total) by mouth daily. 07/01/15   Burgess Estelle, MD  benztropine (COGENTIN) 0.5 MG tablet Take 1 tablet (0.5 mg total) by mouth daily. 03/23/21 09/02/21  Money, Lowry Ram, FNP  BIOTIN PO Take 1 tablet by mouth daily.    [provider]  enoxaparin (LOVENOX) 100 MG/ML injection Inject 0.9 mLs (90 mg total) into the  skin every 12 (twelve) hours. 07/24/21   Hayden Rasmussen, MD  folic acid (FOLVITE) 1 MG tablet Take 1 mg by mouth daily.    [provider]  hydrochlorothiazide (HYDRODIURIL) 25 MG tablet Take 12.5 mg by mouth daily. 12/21/20 12/22/21  [provider]  omeprazole (PRILOSEC) 20 MG capsule Take 20 mg by mouth daily as needed (acid reflux). 05/07/21   [provider]  polyethylene glycol powder (GLYCOLAX/MIRALAX) 17 GM/SCOOP powder Take 17 g by mouth daily as needed for mild constipation. 04/13/21   [provider]  risperiDONE (RISPERDAL) 1 MG tablet Take 3 mg by mouth at bedtime.    [provider]  traZODone (DESYREL) 50 MG tablet Take 1 tablet (50 mg total) by mouth at bedtime. Patient taking differently: Take 100 mg by mouth at bedtime. 03/23/21   Money, Lowry Ram, FNP  vitamin C (ASCORBIC ACID) 250 MG tablet  Take 250 mg by mouth daily.    [provider]    Family History Family History  Problem Relation Age of Onset   Cancer - Other Father    Coronary artery disease Mother    Stroke Mother    Hypertension Mother    Hypertension Sister    Colon cancer Neg Hx     Social History Social History   Tobacco Use   Smoking status: Never   Smokeless tobacco: Never  Vaping Use   Vaping Use: Never used  Substance Use Topics   Alcohol use: No   Drug use: No     Allergies   Pork-derived products   Review of Systems Review of Systems  Constitutional:  Positive for chills and fever. Negative for appetite change.  HENT:  Positive for congestion and sore throat. Negative for ear pain, rhinorrhea, sinus pressure and sinus pain.   Eyes:  Negative for redness and visual disturbance.  Respiratory:  Positive for cough. Negative for chest tightness, shortness of breath and wheezing.   Cardiovascular:  Negative for chest pain and palpitations.  Gastrointestinal:  Negative for abdominal pain, constipation, diarrhea, nausea and vomiting.  Genitourinary:  Negative for dysuria, frequency and urgency.  Musculoskeletal:  Positive for myalgias.  Neurological:  Negative for dizziness, weakness and headaches.  Psychiatric/Behavioral:  Negative for confusion.   All other systems reviewed and are negative.   Physical Exam Triage Vital Signs ED Triage Vitals  Enc Vitals Group     BP 12/09/21 1159 127/76     Pulse Rate 12/09/21 1159 (!) 114     Resp 12/09/21 1159 18     Temp 12/09/21 1159 (!) 101.9 F (38.8 C)     Temp src --      SpO2 12/09/21 1159 98 %     Weight --      Height --      Head Circumference --      Peak Flow --      Pain Score 12/09/21 1156 5     Pain Loc --      Pain Edu? --      Excl. in [place]? --    No data found.  Updated Vital Signs BP 127/76    Pulse (!) 114    Temp (!) 101.9 F (38.8 C)    Resp 18    SpO2 98%   Visual Acuity Right Eye Distance:   Left  Eye Distance:   Bilateral Distance:    Right Eye Near:   Left Eye Near:    Bilateral Near:  Physical Exam Vitals reviewed.  Constitutional:      General: She is not in acute distress.    Appearance: Normal appearance. She is ill-appearing.  HENT:     Head: Normocephalic and atraumatic.     Right Ear: Tympanic membrane, ear canal and external ear normal. No tenderness. No middle ear effusion. There is no impacted cerumen. Tympanic membrane is not perforated, erythematous, retracted or bulging.     Left Ear: Tympanic membrane, ear canal and external ear normal. No tenderness.  No middle ear effusion. There is no impacted cerumen. Tympanic membrane is not perforated, erythematous, retracted or bulging.     Nose: Nose normal. No congestion.     Mouth/Throat:     Mouth: Mucous membranes are moist.     Pharynx: Uvula midline. Posterior oropharyngeal erythema present. No oropharyngeal exudate.     Comments: Smooth erythema posterior pharynx Tonsils not visible. Eyes:     Extraocular Movements: Extraocular movements intact.     Pupils: Pupils are equal, round, and reactive to light.  Cardiovascular:     Rate and Rhythm: Normal rate and regular rhythm.     Heart sounds: Normal heart sounds.  Pulmonary:     Effort: Pulmonary effort is normal.     Breath sounds: Normal breath sounds. No decreased breath sounds, wheezing, rhonchi or rales.  Abdominal:     Palpations: Abdomen is soft.     Tenderness: There is no abdominal tenderness. There is no guarding or rebound.  Lymphadenopathy:     Cervical: No cervical adenopathy.     Right cervical: No superficial cervical adenopathy.    Left cervical: No superficial cervical adenopathy.  Neurological:     General: No focal deficit present.     Mental Status: She is alert and oriented to person, place, and time.  Psychiatric:        Mood and Affect: Mood normal.        Behavior: Behavior normal.        Thought Content: Thought content  normal.        Judgment: Judgment normal.     UC Treatments / Results  Labs (all labs ordered are listed, but only abnormal results are displayed) Labs Reviewed  SARS CORONAVIRUS 2 (TAT 6-24 HRS)  POC INFLUENZA A AND B ANTIGEN (URGENT CARE ONLY)    EKG   Radiology No results found.  Procedures Procedures (including critical care time)  Medications Ordered in UC Medications  acetaminophen (TYLENOL) tablet 650 mg (650 mg Oral Given 12/09/21 1205)    Initial Impression / Assessment and Plan / UC Course  I have reviewed the triage vital signs and the nursing notes.  Pertinent labs & imaging results that were available during my care of the patient were reviewed by me and considered in my medical decision making (see chart for details).     This patient is a very pleasant 46 y.o. year old female presenting with viral syndrome x2 days. Febrile at 101.9, tachy at 114. Lung sounds clear throughout and oxygenating well on room air. IUD contraception. Tylenol administered during visit.  Rapid influenza negative. Covid PCR sent.  Zofran ODT sent to have on hand. She is content with over-the-counter medications.  ED return precautions discussed. Patient verbalizes understanding and agreement.   Coding Level 4 for acute illness with systemic symptoms, and prescription drug management   Final Clinical Impressions(s) / UC Diagnoses   Final diagnoses:  Viral URI with cough  Encounter for screening for COVID-19  Discharge Instructions      -Your flu test was negative, we are also going to test for COVID. -Take the Zofran (ondansetron) up to 3 times daily for nausea and vomiting. Dissolve one pill under your tongue or between your teeth and your cheek. -Over-the-counter medications for additional relief -With a virus, you're typically contagious for 5-7 days, or as long as you're having fevers.  - Follow-up with new symptoms of shortness of breath, chest pain.     ED  Prescriptions     Medication Sig Dispense Auth. Provider   ondansetron (ZOFRAN-ODT) 8 MG disintegrating tablet Take 1 tablet (8 mg total) by mouth every 8 (eight) hours as needed for nausea or vomiting. 20 tablet Hazel Sams, PA-C      PDMP not reviewed this encounter.   Hazel Sams, PA-C 12/09/21 1246

## 2021-12-09 NOTE — Discharge Instructions (Addendum)
-  Your flu test was negative, we are also going to test for COVID. -Take the Zofran (ondansetron) up to 3 times daily for nausea and vomiting. Dissolve one pill under your tongue or between your teeth and your cheek. -Over-the-counter medications for additional relief -With a virus, you're typically contagious for 5-7 days, or as long as you're having fevers.  - Follow-up with new symptoms of shortness of breath, chest pain.

## 2021-12-09 NOTE — ED Triage Notes (Signed)
For the past week pt reports having chils,cough and sore throat.

## 2023-01-14 ENCOUNTER — Ambulatory Visit (HOSPITAL_COMMUNITY)
Admission: EM | Admit: 2023-01-14 | Discharge: 2023-01-14 | Disposition: A | Discharge status: Home / Self Care | Payer: No Typology Code available for payment source | Attending: Emergency Medicine | Admitting: Emergency Medicine

## 2023-01-14 ENCOUNTER — Other Ambulatory Visit: Payer: Self-pay

## 2023-01-14 ENCOUNTER — Encounter (HOSPITAL_COMMUNITY): Payer: Self-pay | Admitting: *Deleted

## 2023-01-14 DIAGNOSIS — F430 Acute stress reaction: Secondary | ICD-10-CM | POA: Diagnosis not present

## 2023-01-14 DIAGNOSIS — G4709 Other insomnia: Secondary | ICD-10-CM

## 2023-01-14 MED ORDER — HYDROXYZINE HCL 25 MG PO TABS: ORAL_TABLET | ORAL | 0 refills | Status: DC

## 2023-01-14 NOTE — ED Triage Notes (Signed)
Pt reports sh not slept in 4 days. Pt is requesting something for sleep.

## 2023-01-14 NOTE — Discharge Instructions (Addendum)
Take medication as prescribed. Due to increased anxiety, recommend taking low dose during the day with higher dose at bedtime. You can take up to 166m once a day at bedtime. Follow-up with your regular provider for further management or go to the behavioral health urgent care.

## 2023-01-14 NOTE — ED Provider Notes (Signed)
[place]    CSN: [id number] Arrival date & time: 01/14/23  1603      History   Chief Complaint Chief Complaint  Patient presents with   Insomnia    HPI [name] is a 47 y.o. female.   Patient presents with concerns of being unable to sleep. She reports she has been unable to fall asleep the past four days. The patient admits to high stress and anxiety as she may have to go to prison. She has tried various OTC supplements such as chamomile tea without improvement. She has history of insomnia and is already prescribed trazodone. She requests something to try to help her sleep.   The history is provided by the patient.  Insomnia    Past Medical History:  Diagnosis Date   Anemia    Anxiety    Arthritis    Takyasu Arthritis    Benign tumor of back    Carotid occlusion, left    DVT (deep venous thrombosis) (HCC)    GI bleed 04/2017   Hypertension    Menorrhagia    PVD (peripheral vascular disease) (HCC)    Stroke (cerebrum) (HCC)    Stroke (HCC)    left sided deficits   Swelling, lymph nodes 2006   intermittant, benign    Patient Active Problem List   Diagnosis Date Noted   Lower GI bleed 05/02/2017   History of DVT (deep vein thrombosis) 05/02/2017   GIB (gastrointestinal bleeding) 05/02/2017   Bleeding per rectum 04/26/2017   S/P IVC filter 04/26/2017   HLD (hyperlipidemia)    Spastic hemiplegia affecting nondominant side ([place]) 09/10/2013   Cognitive deficits, late effect of cerebrovascular disease [id number]   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   Postprocedural respiratory failure ([place]) 02/16/2013   Essential hypertension, malignant 02/16/2013   Sinus tachycardia 02/16/2013   History of CVA (cerebrovascular accident) 12/05/2012   Subtherapeutic international normalized ratio (INR) 12/05/2012   DEPRESSION 09/04/2006   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   CONSTIPATION 09/04/2006   OVARIAN CYST 09/04/2006   FOOT DROP, LEFT 09/04/2006     Past Surgical History:  Procedure Laterality Date   BREAST SURGERY     bil breast implants   COLONOSCOPY WITH PROPOFOL N/A 04/28/2017   Procedure: COLONOSCOPY WITH PROPOFOL;  Surgeon: Doran Stabler, MD;  Location: [facility];  Service: Endoscopy;  Laterality: N/A;   ivc filter placement     TrapEase IVC filter Safe 1.5T    OB History     Gravida  1   Para      Term      Preterm      AB      Living  1      SAB      IAB      Ectopic      Multiple      Live Births               Home Medications    Prior to Admission medications   Medication Sig Start Date End Date Taking? Authorizing Provider  hydrOXYzine (ATARAX) 25 MG tablet Take one tablet (23m) up to every 6 hours with bedtime dose increased two tablets (567m 01/14/23  Yes ArAbner GreenspanAmy L, PA  aspirin EC 81 MG EC tablet Take 1 tablet (81 mg total) by mouth daily. 07/01/15   SaBurgess EstelleMD  benztropine (COGENTIN) 0.5 MG tablet Take 1 tablet (0.5 mg total) by mouth daily. 03/23/21 09/02/21  Money, TrLowry RamFNP  BIOTIN PO Take 1 tablet by mouth daily.    [provider]  enoxaparin (LOVENOX) 100 MG/ML injection Inject 0.9 mLs (90 mg total) into the skin every 12 (twelve) hours. 07/24/21   Hayden Rasmussen, MD  folic acid (FOLVITE) 1 MG tablet Take 1 mg by mouth daily.    [provider]  omeprazole (PRILOSEC) 20 MG capsule Take 20 mg by mouth daily as needed (acid reflux). 05/07/21   [provider]  ondansetron (ZOFRAN-ODT) 8 MG disintegrating tablet Take 1 tablet (8 mg total) by mouth every 8 (eight) hours as needed for nausea or vomiting. 12/09/21   Hazel Sams, PA-C  polyethylene glycol powder (GLYCOLAX/MIRALAX) 17 GM/SCOOP powder Take 17 g by mouth daily as needed for mild constipation. 04/13/21   [provider]  risperiDONE (RISPERDAL) 1 MG tablet Take 3 mg by mouth at bedtime.    [provider]  traZODone (DESYREL) 50 MG tablet Take 1 tablet (50 mg  total) by mouth at bedtime. Patient taking differently: Take 100 mg by mouth at bedtime. 03/23/21   Money, Lowry Ram, FNP  vitamin C (ASCORBIC ACID) 250 MG tablet Take 250 mg by mouth daily.    [provider]    Family History Family History  Problem Relation Age of Onset   Cancer - Other Father    Coronary artery disease Mother    Stroke Mother    Hypertension Mother    Hypertension Sister    Colon cancer Neg Hx     Social History Social History   Tobacco Use   Smoking status: Never   Smokeless tobacco: Never  Vaping Use   Vaping Use: Never used  Substance Use Topics   Alcohol use: No   Drug use: No     Allergies   Pork-derived products   Review of Systems Review of Systems  Psychiatric/Behavioral:  Positive for sleep disturbance. Negative for agitation, confusion and hallucinations. The patient is nervous/anxious and has insomnia.      Physical Exam Triage Vital Signs ED Triage Vitals  Enc Vitals Group     BP 01/14/23 1741 134/78     Pulse Rate 01/14/23 1741 74     Resp 01/14/23 1741 18     Temp 01/14/23 1741 98.4 F (36.9 C)     Temp src --      SpO2 01/14/23 1741 96 %     Weight --      Height --      Head Circumference --      Peak Flow --      Pain Score 01/14/23 1740 0     Pain Loc --      Pain Edu? --      Excl. in [place]? --    No data found.  Updated Vital Signs BP 134/78   Pulse 74   Temp 98.4 F (36.9 C)   Resp 18   SpO2 96%   Visual Acuity Right Eye Distance:   Left Eye Distance:   Bilateral Distance:    Right Eye Near:   Left Eye Near:    Bilateral Near:     Physical Exam Vitals and nursing note reviewed.  Constitutional:      General: She is not in acute distress. Cardiovascular:     Rate and Rhythm: Normal rate and regular rhythm.     Heart sounds: Normal heart sounds.  Pulmonary:     Effort: Pulmonary effort is normal.     Breath  sounds: Normal breath sounds.  Neurological:     Mental Status: She is alert.   Psychiatric:        Mood and Affect: Affect normal. Mood is anxious.        Speech: Speech normal.        Behavior: Behavior normal.      UC Treatments / Results  Labs (all labs ordered are listed, but only abnormal results are displayed) Labs Reviewed - No data to display  EKG   Radiology No results found.  Procedures Procedures (including critical care time)  Medications Ordered in UC Medications - No data to display  Initial Impression / Assessment and Plan / UC Course  I have reviewed the triage vital signs and the nursing notes.  Pertinent labs & imaging results that were available during my care of the patient were reviewed by me and considered in my medical decision making (see chart for details).     Trial short term hydroxyzine to see if this helps patient's insomnia. Encouraged to continue trying other modalities such as listening to progressive relaxation tapes. [facility] and discussed [place].   E/M: 1 chronic illness with exacerbation, no data, moderate risk due to prescription management  Final Clinical Impressions(s) / UC Diagnoses   Final diagnoses:  Other insomnia  Stress reaction     Discharge Instructions      Take medication as prescribed. Due to increased anxiety, recommend taking low dose during the day with higher dose at bedtime. You can take up to 19m once a day at bedtime. Follow-up with your regular provider for further management or go to the behavioral health urgent care.     ED Prescriptions     Medication Sig Dispense Auth. Provider   hydrOXYzine (ATARAX) 25 MG tablet Take one tablet (271m up to every 6 hours with bedtime dose increased two tablets (5087m20 tablet ArrAbner Greenspanmy L, PA      PDMP not reviewed this encounter.   ArrDelsa SaleA [place]/19/24 [id number]

## 2023-05-31 ENCOUNTER — Encounter (HOSPITAL_COMMUNITY): Payer: Self-pay | Admitting: *Deleted

## 2023-05-31 ENCOUNTER — Ambulatory Visit (HOSPITAL_COMMUNITY)
Admission: EM | Admit: 2023-05-31 | Discharge: 2023-05-31 | Disposition: A | Discharge status: Home / Self Care | Payer: No Typology Code available for payment source | Attending: Emergency Medicine | Admitting: Emergency Medicine

## 2023-05-31 ENCOUNTER — Ambulatory Visit (HOSPITAL_COMMUNITY): Payer: No Typology Code available for payment source

## 2023-05-31 DIAGNOSIS — R0602 Shortness of breath: Secondary | ICD-10-CM

## 2023-05-31 DIAGNOSIS — Z760 Encounter for issue of repeat prescription: Secondary | ICD-10-CM | POA: Insufficient documentation

## 2023-05-31 DIAGNOSIS — R5383 Other fatigue: Secondary | ICD-10-CM | POA: Insufficient documentation

## 2023-05-31 DIAGNOSIS — Z20822 Contact with and (suspected) exposure to covid-19: Secondary | ICD-10-CM | POA: Diagnosis present

## 2023-05-31 DIAGNOSIS — R531 Weakness: Secondary | ICD-10-CM | POA: Diagnosis present

## 2023-05-31 LAB — CBC WITH DIFFERENTIAL/PLATELET
Abs Immature Granulocytes: 0.02 10*3/uL (ref 0.00–0.07)
Basophils Absolute: 0 10*3/uL (ref 0.0–0.1)
Basophils Relative: 1 %
Eosinophils Absolute: 0.1 10*3/uL (ref 0.0–0.5)
Eosinophils Relative: 2 %
HCT: 38 % (ref 36.0–46.0)
Hemoglobin: 13.2 g/dL (ref 12.0–15.0)
Immature Granulocytes: 0 %
Lymphocytes Relative: 25 %
Lymphs Abs: 2 10*3/uL (ref 0.7–4.0)
MCH: 31.4 pg (ref 26.0–34.0)
MCHC: 34.7 g/dL (ref 30.0–36.0)
MCV: 90.5 fL (ref 80.0–100.0)
Monocytes Absolute: 0.6 10*3/uL (ref 0.1–1.0)
Monocytes Relative: 7 %
Neutro Abs: 5.3 10*3/uL (ref 1.7–7.7)
Neutrophils Relative %: 65 %
Platelets: 367 10*3/uL (ref 150–400)
RBC: 4.2 MIL/uL (ref 3.87–5.11)
RDW: 14.4 % (ref 11.5–15.5)
WBC: 8.1 10*3/uL (ref 4.0–10.5)
nRBC: 0 % (ref 0.0–0.2)

## 2023-05-31 LAB — SARS CORONAVIRUS 2 (TAT 6-24 HRS): SARS Coronavirus 2: NEGATIVE

## 2023-05-31 LAB — COMPREHENSIVE METABOLIC PANEL
ALT: 21 U/L (ref 0–44)
AST: 24 U/L (ref 15–41)
Albumin: 3.7 g/dL (ref 3.5–5.0)
Alkaline Phosphatase: 70 U/L (ref 38–126)
Anion gap: 11 (ref 5–15)
BUN: 12 mg/dL (ref 6–20)
CO2: 25 mmol/L (ref 22–32)
Calcium: 8.9 mg/dL (ref 8.9–10.3)
Chloride: 103 mmol/L (ref 98–111)
Creatinine, Ser: 1.05 mg/dL — ABNORMAL HIGH (ref 0.44–1.00)
GFR, Estimated: >60 mL/min (ref >60)
Glucose, Bld: 126 mg/dL — ABNORMAL HIGH (ref 70–99)
Potassium: 2.8 mmol/L — ABNORMAL LOW (ref 3.5–5.1)
Sodium: 139 mmol/L (ref 135–145)
Total Bilirubin: 0.6 mg/dL (ref 0.3–1.2)
Total Protein: 6.6 g/dL (ref 6.5–8.1)

## 2023-05-31 MED ORDER — HYDROXYZINE HCL 25 MG PO TABS: 25.0000 mg | ORAL_TABLET | Freq: Four times a day (QID) | ORAL | 0 refills | Status: DC

## 2023-05-31 NOTE — ED Triage Notes (Signed)
Pt states she has been SOB, Weak, fatigued, no energy x 2 weeks. She states she did have some vomiting 2 weeks ago but it has resolved. She states she has restarted her iron supp and MVI to try to help but it hasn't provided any relief.

## 2023-05-31 NOTE — Discharge Instructions (Addendum)
Your chest x-ray was negative for pneumonia or infection.  We have swabbed you for COVID-19 and we will contact you if positive.  We have obtained basic lab work and we will contact you if there is anything emergent.  In the meantime, please ensure you are drinking at least 64 ounces of water daily, you can add an electrolyte solution like Pedialyte or liquid IV as well.  Please continue to take your iron and multivitamin.  Please seek immediate care at the nearest Emergency Department if you develop worsening shortness of breath, loss of consciousness, blood in your stool, fever, any new concerning symptoms for further evaluation.

## 2023-05-31 NOTE — ED Provider Notes (Signed)
[facility]    CSN: [id number] Arrival date & time: 05/31/23  1400      History   Chief Complaint Chief Complaint  Patient presents with   Shortness of Breath   Weakness   Fatigue    HPI [name] is a 47 y.o. female.   Patient presents to clinic for shortness of breath, weakness, fatigue and lack of energy over the past 2 weeks. She goes to jail every month for 20 days and does not usually like the food they serve at jail, reports that the meat is raw.  While she was at jail she started to have vomiting and diminished appetite and feeling unwell.  Over the past week she has been out of jail and her appetite has returned.  No more emesis.  She denies any fevers.  She did recently restart her multivitamin and iron supplement, she is anemic.  She would also like a refill of her hydroxyzine, she takes this at night to help with sleep.  Reports compliance with her Eliquis, is taking this daily.    The history is provided by the patient and medical records.  Shortness of Breath Associated symptoms: no chest pain, no cough, no fever and no wheezing   Weakness Associated symptoms: shortness of breath   Associated symptoms: no chest pain, no cough, no dysuria and no fever     Past Medical History:  Diagnosis Date   Anemia    Anxiety    Arthritis    Takyasu Arthritis    Benign tumor of back    Carotid occlusion, left    DVT (deep venous thrombosis) (HCC)    GI bleed 04/2017   Hypertension    Menorrhagia    PVD (peripheral vascular disease) (HCC)    Stroke (cerebrum) (HCC)    Stroke (HCC)    left sided deficits   Swelling, lymph nodes 2006   intermittant, benign    Patient Active Problem List   Diagnosis Date Noted   Lower GI bleed 05/02/2017   History of DVT (deep vein thrombosis) 05/02/2017   GIB (gastrointestinal bleeding) 05/02/2017   Bleeding per rectum 04/26/2017   S/P IVC filter 04/26/2017   HLD (hyperlipidemia)    Spastic hemiplegia  affecting nondominant side (HCC) 09/10/2013   Cognitive deficits, late effect of cerebrovascular disease 09/10/2013   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   Postprocedural respiratory failure (HCC) 02/16/2013   Essential hypertension, malignant 02/16/2013   Sinus tachycardia 02/16/2013   History of CVA (cerebrovascular accident) 12/05/2012   Subtherapeutic international normalized ratio (INR) 12/05/2012   DEPRESSION 09/04/2006   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   CONSTIPATION 09/04/2006   OVARIAN CYST 09/04/2006   FOOT DROP, LEFT 09/04/2006    Past Surgical History:  Procedure Laterality Date   BREAST SURGERY     bil breast implants   COLONOSCOPY WITH PROPOFOL N/A 04/28/2017   Procedure: COLONOSCOPY WITH PROPOFOL;  Surgeon: Sherrilyn Rist, MD;  Location: [facility];  Service: Endoscopy;  Laterality: N/A;   ivc filter placement     TrapEase IVC filter Safe 1.5T    OB History     Gravida  1   Para      Term      Preterm      AB      Living  1      SAB      IAB      Ectopic      Multiple  Live Births               Home Medications    Prior to Admission medications   Medication Sig Start Date End Date Taking? Authorizing Provider  aspirin EC 81 MG EC tablet Take 1 tablet (81 mg total) by mouth daily. 07/01/15  Yes Deneise Lever, MD  ELIQUIS 5 MG TABS tablet TAKE ONE TABLET BY MOUTH EVERY 12 HOURS CAUTION BLOOD THINNER 08/07/22 08/08/23 Yes [provider]  folic acid (FOLVITE) 1 MG tablet Take 1 mg by mouth daily.   Yes [provider]  hydrochlorothiazide (HYDRODIURIL) 12.5 MG tablet Take by mouth. 01/23/23  Yes [provider]  hydrOXYzine (ATARAX) 25 MG tablet Take 1 tablet (25 mg total) by mouth every 6 (six) hours. 05/31/23  Yes Rinaldo Ratel, [place], FNP  risperiDONE (RISPERDAL) 1 MG tablet Take 3 mg by mouth at bedtime.   Yes [provider]  traZODone (DESYREL) 50 MG tablet Take 1 tablet (50 mg total) by mouth at  bedtime. Patient taking differently: Take 100 mg by mouth at bedtime. 03/23/21  Yes Money, Gerlene Burdock, FNP  benztropine (COGENTIN) 0.5 MG tablet Take 1 tablet (0.5 mg total) by mouth daily. 03/23/21 09/02/21  Money, Gerlene Burdock, FNP  BIOTIN PO Take 1 tablet by mouth daily.    [provider]  enoxaparin (LOVENOX) 100 MG/ML injection Inject 0.9 mLs (90 mg total) into the skin every 12 (twelve) hours. 07/24/21   Terrilee Files, MD  omeprazole (PRILOSEC) 20 MG capsule Take 20 mg by mouth daily as needed (acid reflux). 05/07/21   [provider]  ondansetron (ZOFRAN-ODT) 8 MG disintegrating tablet Take 1 tablet (8 mg total) by mouth every 8 (eight) hours as needed for nausea or vomiting. 12/09/21   Rhys Martini, PA-C  polyethylene glycol powder (GLYCOLAX/MIRALAX) 17 GM/SCOOP powder Take 17 g by mouth daily as needed for mild constipation. 04/13/21   [provider]  vitamin C (ASCORBIC ACID) 250 MG tablet Take 250 mg by mouth daily.    [provider]    Family History Family History  Problem Relation Age of Onset   Cancer - Other Father    Coronary artery disease Mother    Stroke Mother    Hypertension Mother    Hypertension Sister    Colon cancer Neg Hx     Social History Social History   Tobacco Use   Smoking status: Never   Smokeless tobacco: Never  Vaping Use   Vaping Use: Never used  Substance Use Topics   Alcohol use: No   Drug use: No     Allergies   Pork-derived products   Review of Systems Review of Systems  Constitutional:  Positive for fatigue. Negative for fever.  Respiratory:  Positive for shortness of breath. Negative for cough and wheezing.   Cardiovascular:  Negative for chest pain.  Genitourinary:  Negative for dysuria.  Neurological:  Positive for weakness.     Physical Exam Triage Vital Signs ED Triage Vitals  Enc Vitals Group     BP 05/31/23 1453 135/84     Pulse Rate 05/31/23 1453 95     Resp 05/31/23 1453 18      Temp 05/31/23 1453 98.5 F (36.9 C)     Temp Source 05/31/23 1453 Oral     SpO2 05/31/23 1453 96 %     Weight --      Height --      Head Circumference --  Peak Flow --      Pain Score 05/31/23 1450 0     Pain Loc --      Pain Edu? --      Excl. in GC? --    No data found.  Updated Vital Signs BP 135/84 (BP Location: Right Arm)   Pulse 95   Temp 98.5 F (36.9 C) (Oral)   Resp 18   SpO2 96%   Visual Acuity Right Eye Distance:   Left Eye Distance:   Bilateral Distance:    Right Eye Near:   Left Eye Near:    Bilateral Near:     Physical Exam Vitals and nursing note reviewed.  Constitutional:      Appearance: She is well-developed.  HENT:     Head: Normocephalic and atraumatic.     Mouth/Throat:     Mouth: Mucous membranes are moist.     Pharynx: Oropharynx is clear.  Eyes:     Pupils: Pupils are equal, round, and reactive to light.  Cardiovascular:     Rate and Rhythm: Normal rate and regular rhythm.  Pulmonary:     Effort: Pulmonary effort is normal.     Breath sounds: Normal breath sounds. No decreased breath sounds.  Musculoskeletal:        General: Normal range of motion.  Skin:    General: Skin is warm and dry.  Neurological:     General: No focal deficit present.     Mental Status: She is alert and oriented to person, place, and time.  Psychiatric:        Mood and Affect: Mood normal.      UC Treatments / Results  Labs (all labs ordered are listed, but only abnormal results are displayed) Labs Reviewed  SARS CORONAVIRUS 2 (TAT 6-24 HRS)  CBC WITH DIFFERENTIAL/PLATELET  COMPREHENSIVE METABOLIC PANEL    EKG   Radiology DG Chest 2 View  Result Date: 05/31/2023 CLINICAL DATA:  Shortness of breath and fatigue. EXAM: CHEST - 2 VIEW COMPARISON:  07/24/2021.  Chest CT 07/24/2021. FINDINGS: The lungs are clear without focal pneumonia, edema, pneumothorax or pleural effusion. Hazy opacity overlying the lower lungs bilaterally likely  superimposition of soft tissues. The cardiopericardial silhouette is within normal limits for size. No acute bony abnormality. IMPRESSION: No active cardiopulmonary disease. Electronically Signed   By: Kennith Center M.D.   On: 05/31/2023 15:33    Procedures Procedures (including critical care time)  Medications Ordered in UC Medications - No data to display  Initial Impression / Assessment and Plan / UC Course  I have reviewed the triage vital signs and the nursing notes.  Pertinent labs & imaging results that were available during my care of the patient were reviewed by me and considered in my medical decision making (see chart for details).  Vitals and triage reviewed, patient is hemodynamically stable.  Subjective shortness of breath with exertion, lungs are vesicular posteriorly.  Afebrile and without cough.  Patient does have a history of anemia, recently restarted her iron supplement after being off of it for prolonged period.  Reports compliance with her Eliquis.  Chest x-ray does not have infiltrate or concern for PNA.   Had recent exposure to COVID, COVID-19 testing obtained.  Discussed that she is well outside the treatment window if positive.  Basic labs also obtained to evaluate for hemoglobin and metabolic abnormalities.  Symptomatic management discussed, strict emergency and return precautions given, no questions at this time.   Final Clinical Impressions(s) /  UC Diagnoses   Final diagnoses:  Exposure to COVID-19 virus  Generalized weakness  Other fatigue  Medication refill     Discharge Instructions      Your chest x-ray was negative for pneumonia or infection.  We have swabbed you for COVID-19 and we will contact you if positive.  We have obtained basic lab work and we will contact you if there is anything emergent.  In the meantime, please ensure you are drinking at least 64 ounces of water daily, you can add an electrolyte solution like Pedialyte or liquid IV as well.   Please continue to take your iron and multivitamin.  Please seek immediate care at the nearest Emergency Department if you develop worsening shortness of breath, loss of consciousness, blood in your stool, fever, any new concerning symptoms for further evaluation.       ED Prescriptions     Medication Sig Dispense Auth. Provider   hydrOXYzine (ATARAX) 25 MG tablet Take 1 tablet (25 mg total) by mouth every 6 (six) hours. 30 tablet Bolivar Koranda, [place], [place]      PDMP not reviewed this encounter.   Emberlynn Riggan, [place] N, [place] 05/31/23 [phone]

## 2024-02-05 ENCOUNTER — Encounter (HOSPITAL_COMMUNITY): Payer: Self-pay

## 2024-02-05 ENCOUNTER — Ambulatory Visit (HOSPITAL_COMMUNITY)
Admission: EM | Admit: 2024-02-05 | Discharge: 2024-02-05 | Disposition: A | Discharge status: Home / Self Care | Attending: Emergency Medicine | Admitting: Emergency Medicine

## 2024-02-05 DIAGNOSIS — J069 Acute upper respiratory infection, unspecified: Secondary | ICD-10-CM | POA: Diagnosis present

## 2024-02-05 LAB — POCT INFLUENZA A/B
Influenza A, POC: NEGATIVE
Influenza B, POC: NEGATIVE

## 2024-02-05 MED ORDER — GUAIFENESIN ER 600 MG PO TB12: 600.0000 mg | ORAL_TABLET | Freq: Two times a day (BID) | ORAL | 0 refills | Status: AC

## 2024-02-05 MED ORDER — BENZONATATE 100 MG PO CAPS: 100.0000 mg | ORAL_CAPSULE | Freq: Three times a day (TID) | ORAL | 0 refills | Status: DC

## 2024-02-05 NOTE — ED Triage Notes (Signed)
 Patient is here for cough and nasal congestion x 3 days. Patient is coughing up mucous.

## 2024-02-05 NOTE — ED Provider Notes (Signed)
 [facility]    CSN: [id number] Arrival date & time: 02/05/24  1122      History   Chief Complaint Chief Complaint  Patient presents with   Cough   Nasal Congestion    HPI [name] is a 48 y.o. female.   Patient presents to clinic over concerns of a productive cough with phlegm, nasal congestion, rhinorrhea and sore throat for the past 3 days.  She has shortness of breath of baseline and has been unable to tell if this is worsened.  Denies wheezing.  Denies fevers.  Think she had hot and cold chills one night.  Without vomiting or diarrhea.  Has not tried any interventions at home.  Reports she is on probation and is unsure which she can take.  She lives with her husband, he has not had any similar symptoms.  The history is provided by the patient and medical records.  Cough   Past Medical History:  Diagnosis Date   Anemia    Anxiety    Arthritis    Takyasu Arthritis    Benign tumor of back    Carotid occlusion, left    DVT (deep venous thrombosis) (HCC)    GI bleed 04/2017   Hypertension    Menorrhagia    PVD (peripheral vascular disease) (HCC)    Stroke (cerebrum) (HCC)    Stroke (HCC)    left sided deficits   Swelling, lymph nodes 2006   intermittant, benign    Patient Active Problem List   Diagnosis Date Noted   Lower GI bleed 05/02/2017   History of DVT (deep vein thrombosis) 05/02/2017   GIB (gastrointestinal bleeding) 05/02/2017   Bleeding per rectum 04/26/2017   S/P IVC filter 04/26/2017   HLD (hyperlipidemia)    Spastic hemiplegia affecting nondominant side (HCC) 09/10/2013   Cognitive deficits as late effect of cerebrovascular disease 09/10/2013   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   Postprocedural respiratory failure (HCC) 02/16/2013   Essential hypertension, malignant 02/16/2013   Sinus tachycardia 02/16/2013   History of CVA (cerebrovascular accident) 12/05/2012   Subtherapeutic international normalized ratio (INR) 12/05/2012    DEPRESSION 09/04/2006   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   Constipation 09/04/2006   OVARIAN CYST 09/04/2006   FOOT DROP, LEFT 09/04/2006    Past Surgical History:  Procedure Laterality Date   BREAST SURGERY     bil breast implants   COLONOSCOPY WITH PROPOFOL N/A 04/28/2017   Procedure: COLONOSCOPY WITH PROPOFOL;  Surgeon: Sherrilyn Rist, MD;  Location: [facility];  Service: Endoscopy;  Laterality: N/A;   ivc filter placement     TrapEase IVC filter Safe 1.5T    OB History     Gravida  1   Para      Term      Preterm      AB      Living  1      SAB      IAB      Ectopic      Multiple      Live Births               Home Medications    Prior to Admission medications   Medication Sig Start Date End Date Taking? Authorizing Provider  aspirin EC 81 MG EC tablet Take 1 tablet (81 mg total) by mouth daily. 07/01/15  Yes Deneise Lever, MD  benzonatate (TESSALON) 100 MG capsule Take 1 capsule (100 mg total) by mouth every 8 (eight) hours.  02/05/24  Yes Rinaldo Ratel, [place], FNP  BIOTIN PO Take 1 tablet by mouth daily.   Yes [provider]  guaiFENesin (MUCINEX) 600 MG 12 hr tablet Take 1 tablet (600 mg total) by mouth 2 (two) times daily for 5 days. 02/05/24 02/10/24 Yes Rinaldo Ratel, [place] N, FNP  hydrochlorothiazide (HYDRODIURIL) 12.5 MG tablet Take by mouth. 01/23/23  Yes [provider]  omeprazole (PRILOSEC) 20 MG capsule Take 20 mg by mouth daily as needed (acid reflux). 05/07/21  Yes [provider]  ondansetron (ZOFRAN-ODT) 8 MG disintegrating tablet Take 1 tablet (8 mg total) by mouth every 8 (eight) hours as needed for nausea or vomiting. 12/09/21  Yes Rhys Martini, PA-C  risperiDONE (RISPERDAL) 1 MG tablet Take 3 mg by mouth at bedtime.   Yes [provider]  traZODone (DESYREL) 50 MG tablet Take 1 tablet (50 mg total) by mouth at bedtime. Patient taking differently: Take 100 mg by mouth at bedtime. 03/23/21  Yes  Money, Gerlene Burdock, FNP  vitamin C (ASCORBIC ACID) 250 MG tablet Take 250 mg by mouth daily.   Yes [provider]  benztropine (COGENTIN) 0.5 MG tablet Take 1 tablet (0.5 mg total) by mouth daily. 03/23/21 09/02/21  Money, Gerlene Burdock, FNP  ELIQUIS 5 MG TABS tablet TAKE ONE TABLET BY MOUTH EVERY 12 HOURS CAUTION BLOOD THINNER 08/07/22 08/08/23  [provider]  enoxaparin (LOVENOX) 100 MG/ML injection Inject 0.9 mLs (90 mg total) into the skin every 12 (twelve) hours. 07/24/21   Terrilee Files, MD  folic acid (FOLVITE) 1 MG tablet Take 1 mg by mouth daily.    [provider]  hydrOXYzine (ATARAX) 25 MG tablet Take 1 tablet (25 mg total) by mouth every 6 (six) hours. 05/31/23   Vallory Oetken, [place] N, FNP  polyethylene glycol powder [facility]) 17 GM/SCOOP powder Take 17 g by mouth daily as needed for mild constipation. 04/13/21   [provider]    Family History Family History  Problem Relation Age of Onset   Cancer - Other Father    Coronary artery disease Mother    Stroke Mother    Hypertension Mother    Hypertension Sister    Colon cancer Neg Hx     Social History Social History   Tobacco Use   Smoking status: Never   Smokeless tobacco: Never  Vaping Use   Vaping status: Never Used  Substance Use Topics   Alcohol use: No   Drug use: No     Allergies   Pork-derived products   Review of Systems Review of Systems  Per HPI  Physical Exam Triage Vital Signs ED Triage Vitals [02/05/24 1155]  Encounter Vitals Group     BP 138/72     Systolic BP Percentile      Diastolic BP Percentile      Pulse Rate (!) 109     Resp 18     Temp 98.3 F (36.8 C)     Temp Source Oral     SpO2 97 %     Weight      Height      Head Circumference      Peak Flow      Pain Score      Pain Loc      Pain Education      Exclude from Growth Chart    No data found.  Updated Vital Signs BP 138/72 (BP Location: Left Arm)   Pulse (!) 109   Temp  98.3  F (36.8 C) (Oral)   Resp 18   SpO2 97%   Visual Acuity Right Eye Distance:   Left Eye Distance:   Bilateral Distance:    Right Eye Near:   Left Eye Near:    Bilateral Near:     Physical Exam Vitals and nursing note reviewed.  Constitutional:      Appearance: Normal appearance.  HENT:     Head: Normocephalic and atraumatic.     Right Ear: External ear normal.     Left Ear: External ear normal.     Nose: Congestion and rhinorrhea present.     Mouth/Throat:     Mouth: Mucous membranes are moist.     Pharynx: Posterior oropharyngeal erythema present.  Eyes:     Conjunctiva/sclera: Conjunctivae normal.  Cardiovascular:     Rate and Rhythm: Normal rate and regular rhythm.     Heart sounds: Normal heart sounds. No murmur heard. Pulmonary:     Effort: Pulmonary effort is normal. No respiratory distress.     Breath sounds: Normal breath sounds. No wheezing.  Musculoskeletal:        General: Normal range of motion.  Skin:    General: Skin is warm and dry.  Neurological:     General: No focal deficit present.     Mental Status: She is alert.  Psychiatric:        Mood and Affect: Mood normal.      UC Treatments / Results  Labs (all labs ordered are listed, but only abnormal results are displayed) Labs Reviewed  SARS CORONAVIRUS 2 (TAT 6-24 HRS)  POCT INFLUENZA A/B    EKG   Radiology No results found.  Procedures Procedures (including critical care time)  Medications Ordered in UC Medications - No data to display  Initial Impression / Assessment and Plan / UC Course  I have reviewed the triage vital signs and the nursing notes.  Pertinent labs & imaging results that were available during my care of the patient were reviewed by me and considered in my medical decision making (see chart for details).  Vitals and triage reviewed, patient is hemodynamically stable.  Lungs are vesicular, heart with regular rate and rhythm.  Congestion, rhinorrhea and postnasal  drip present on physical exam.  Symptoms consistent with viral URI.  POC influenza testing negative in clinic.  Send out COVID test obtained.  Symptomatic management for cough and congestion discussed.  Plan of care, follow-up care return precautions given, no questions at this time.     Final Clinical Impressions(s) / UC Diagnoses   Final diagnoses:  Viral URI with cough     Discharge Instructions      Your flu testing was negative.  We have swabbed you for COVID-19 and will contact if your results are positive.  You most likely have a viral illness, you can manage your congestion by taking 1200 mg of Mucinex, drink at least 64 ounces of water and sleeping with a humidifier.  For your cough you can take the Tessalon Perles every 8 hours as needed.  Warm saline gargles, tea with honey and over-the-counter cough drops can help with sore throat.  Symptoms should improve over the next 5 days or so.  If no improvement or any changes follow-up with your primary care provider or return to clinic.      ED Prescriptions     Medication Sig Dispense Auth. Provider   guaiFENesin (MUCINEX) 600 MG 12 hr tablet Take 1 tablet (600  mg total) by mouth 2 (two) times daily for 5 days. 10 tablet Rinaldo Ratel, [place], FNP   benzonatate (TESSALON) 100 MG capsule Take 1 capsule (100 mg total) by mouth every 8 (eight) hours. 21 capsule Kolden Dupee, [place], [place]      PDMP not reviewed this encounter.   Cala Kruckenberg, [place], [place] 02/05/24 1231

## 2024-02-05 NOTE — Discharge Instructions (Addendum)
 Your flu testing was negative.  We have swabbed you for COVID-19 and will contact if your results are positive.  You most likely have a viral illness, you can manage your congestion by taking 1200 mg of Mucinex, drink at least 64 ounces of water and sleeping with a humidifier.  For your cough you can take the Tessalon Perles every 8 hours as needed.  Warm saline gargles, tea with honey and over-the-counter cough drops can help with sore throat.  Symptoms should improve over the next 5 days or so.  If no improvement or any changes follow-up with your primary care provider or return to clinic.

## 2024-02-06 ENCOUNTER — Telehealth (HOSPITAL_COMMUNITY): Payer: Self-pay

## 2024-02-06 LAB — SARS CORONAVIRUS 2 (TAT 6-24 HRS): SARS Coronavirus 2: POSITIVE — AB

## 2024-02-06 MED ORDER — PROMETHAZINE-DM 6.25-15 MG/5ML PO SYRP: 5.0000 mL | ORAL_SOLUTION | Freq: Four times a day (QID) | ORAL | 0 refills | Status: DC | PRN

## 2024-02-06 NOTE — Telephone Encounter (Signed)
 Reached pt by phone. Notified of positive result and current CDC recommendations. Pt verbalized understanding. Pt c/o excessive nighttime coughing. Promethazine DM sent per protocol.

## 2024-04-11 ENCOUNTER — Encounter (HOSPITAL_COMMUNITY): Payer: Self-pay | Admitting: Emergency Medicine

## 2024-04-11 ENCOUNTER — Other Ambulatory Visit: Payer: Self-pay

## 2024-04-11 ENCOUNTER — Ambulatory Visit (HOSPITAL_COMMUNITY)
Admission: EM | Admit: 2024-04-11 | Discharge: 2024-04-11 | Disposition: A | Discharge status: Home / Self Care | Attending: Physician Assistant | Admitting: Physician Assistant

## 2024-04-11 DIAGNOSIS — J069 Acute upper respiratory infection, unspecified: Secondary | ICD-10-CM

## 2024-04-11 LAB — POC COVID19/FLU A&B COMBO
Covid Antigen, POC: NEGATIVE
Influenza A Antigen, POC: NEGATIVE
Influenza B Antigen, POC: NEGATIVE

## 2024-04-11 MED ORDER — HYDROCODONE BIT-HOMATROP MBR 5-1.5 MG/5ML PO SOLN: 5.0000 mL | Freq: Every day | ORAL | 0 refills | Status: DC

## 2024-04-11 MED ORDER — BENZONATATE 100 MG PO CAPS: 100.0000 mg | ORAL_CAPSULE | Freq: Three times a day (TID) | ORAL | 0 refills | Status: DC

## 2024-04-11 NOTE — Discharge Instructions (Signed)
 You tested negative for COVID and flu.  I suspect you have a different virus.  Use Tessalon  to help with your cough during the day and Hycodan at night.  This medication contains hydrocodone  which is addictive and sedating.  Please try to limit use is much as possible.  We are unable to provide a refill of this medicine.  I also recommend over-the-counter medication including Mucinex , Flonase, Tylenol .  Make sure that you rest and drink plenty of fluids.  If you are not feeling better in a week please return for reevaluation.  If anything worsens and you have high fever, worsening cough, shortness of breath, chest pain, nausea, vomiting you need to be seen immediately.

## 2024-04-11 NOTE — ED Triage Notes (Signed)
 Pt reports persistent cough and running nose and is requesting to be check for Covid.

## 2024-04-11 NOTE — ED Provider Notes (Signed)
 [facility]    CSN: [id number] Arrival date & time: 04/11/24  1133      History   Chief Complaint Chief Complaint  Patient presents with   Cough    HPI [name] is a 48 y.o. female.   Patient presents today with a several day history of URI symptoms including cough, congestion.  She does report shortness of breath but states that this is chronic related to significant past medical history and is unchanged from baseline.  She denies any fever, chest pain, nausea, vomiting, diarrhea.  She is requesting COVID testing as she has had COVID in the past and current symptoms are similar to previous episodes of this condition.  She last tested positive in March 2025.  She has had COVID vaccines.  Denies any recent antibiotics or steroids.  She has been taking NyQuil and over-the-counter cough medicine without improvement of symptoms.    Past Medical History:  Diagnosis Date   Anemia    Anxiety    Arthritis    Takyasu Arthritis    Benign tumor of back    Carotid occlusion, left    DVT (deep venous thrombosis) (HCC)    GI bleed 04/2017   Hypertension    Menorrhagia    PVD (peripheral vascular disease) (HCC)    Stroke (cerebrum) (HCC)    Stroke (HCC)    left sided deficits   Swelling, lymph nodes 2006   intermittant, benign    Patient Active Problem List   Diagnosis Date Noted   Lower GI bleed 05/02/2017   History of DVT (deep vein thrombosis) 05/02/2017   GIB (gastrointestinal bleeding) 05/02/2017   Bleeding per rectum 04/26/2017   S/P IVC filter 04/26/2017   HLD (hyperlipidemia)    Spastic hemiplegia affecting nondominant side (HCC) 09/10/2013   Cognitive deficits as late effect of cerebrovascular disease 09/10/2013   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   Postprocedural respiratory failure (HCC) 02/16/2013   Essential hypertension, malignant 02/16/2013   Sinus tachycardia 02/16/2013   History of CVA (cerebrovascular accident) 12/05/2012   Subtherapeutic  international normalized ratio (INR) 12/05/2012   DEPRESSION 09/04/2006   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   Constipation 09/04/2006   OVARIAN CYST 09/04/2006   FOOT DROP, LEFT 09/04/2006    Past Surgical History:  Procedure Laterality Date   BREAST SURGERY     bil breast implants   COLONOSCOPY WITH PROPOFOL  N/A 04/28/2017   Procedure: COLONOSCOPY WITH PROPOFOL ;  Surgeon: Albertina Hugger, MD;  Location: [facility];  Service: Endoscopy;  Laterality: N/A;   ivc filter placement     TrapEase IVC filter Safe 1.5T    OB History     Gravida  1   Para      Term      Preterm      AB      Living  1      SAB      IAB      Ectopic      Multiple      Live Births               Home Medications    Prior to Admission medications   Medication Sig Start Date End Date Taking? Authorizing Provider  benzonatate  (TESSALON ) 100 MG capsule Take 1 capsule (100 mg total) by mouth every 8 (eight) hours. 04/11/24  Yes Soraiya Ahner K, PA-C  HYDROcodone  bit-homatropine (HYCODAN) 5-1.5 MG/5ML syrup Take 5 mLs by mouth at bedtime. 04/11/24  Yes Kanoe Wanner, [place] Dales  K, PA-C  aspirin  EC 81 MG EC tablet Take 1 tablet (81 mg total) by mouth daily. 07/01/15   Burnell Carry, MD  benztropine  (COGENTIN ) 0.5 MG tablet Take 1 tablet (0.5 mg total) by mouth daily. 03/23/21 09/02/21  Money, Christella Coventry, FNP  BIOTIN  PO Take 1 tablet by mouth daily.    [provider]  ELIQUIS 5 MG TABS tablet TAKE ONE TABLET BY MOUTH EVERY 12 HOURS CAUTION BLOOD THINNER 08/07/22 08/08/23  [provider]  enoxaparin  (LOVENOX ) 100 MG/ML injection Inject 0.9 mLs (90 mg total) into the skin every 12 (twelve) hours. 07/24/21   Tonya Fredrickson, MD  folic acid  (FOLVITE ) 1 MG tablet Take 1 mg by mouth daily.    [provider]  hydrochlorothiazide  (HYDRODIURIL ) 12.5 MG tablet Take by mouth. 01/23/23   [provider]  hydrOXYzine  (ATARAX ) 25 MG tablet Take 1 tablet (25 mg total) by mouth every 6 (six)  hours. 05/31/23   Harlow Lighter, [place]  N, FNP  omeprazole (PRILOSEC) 20 MG capsule Take 20 mg by mouth daily as needed (acid reflux). 05/07/21   [provider]  ondansetron  (ZOFRAN -ODT) 8 MG disintegrating tablet Take 1 tablet (8 mg total) by mouth every 8 (eight) hours as needed for nausea or vomiting. 12/09/21   Graham, Laura E, PA-C  polyethylene glycol powder (GLYCOLAX /MIRALAX ) 17 GM/SCOOP powder Take 17 g by mouth daily as needed for mild constipation. 04/13/21   [provider]  promethazine -dextromethorphan (PROMETHAZINE -DM) 6.25-15 MG/5ML syrup Take 5 mLs by mouth 4 (four) times daily as needed for cough. 02/06/24   Ann Keto, MD  risperiDONE  (RISPERDAL ) 1 MG tablet Take 3 mg by mouth at bedtime.    [provider]  traZODone  (DESYREL ) 50 MG tablet Take 1 tablet (50 mg total) by mouth at bedtime. Patient taking differently: Take 100 mg by mouth at bedtime. 03/23/21   Money, Christella Coventry, FNP  vitamin C  (ASCORBIC ACID ) 250 MG tablet Take 250 mg by mouth daily.    [provider]    Family History Family History  Problem Relation Age of Onset   Cancer - Other Father    Coronary artery disease Mother    Stroke Mother    Hypertension Mother    Hypertension Sister    Colon cancer Neg Hx     Social History Social History   Tobacco Use   Smoking status: Never   Smokeless tobacco: Never  Vaping Use   Vaping status: Never Used  Substance Use Topics   Alcohol use: No   Drug use: No     Allergies   Pork-derived products   Review of Systems Review of Systems  Constitutional:  Positive for activity change. Negative for appetite change, fatigue and fever.  HENT:  Positive for congestion. Negative for sinus pressure, sneezing and sore throat.   Respiratory:  Positive for cough and shortness of breath (at baseline).   Cardiovascular:  Negative for chest pain.  Gastrointestinal:  Negative for abdominal pain, diarrhea, nausea and vomiting.   Musculoskeletal:  Negative for arthralgias and myalgias.  Neurological:  Negative for dizziness, light-headedness and headaches.     Physical Exam Triage Vital Signs ED Triage Vitals [04/11/24 1225]  Encounter Vitals Group     BP 122/78     Systolic BP Percentile      Diastolic BP Percentile      Pulse Rate 98     Resp 18     Temp 98.2 F (36.8 C)  Temp Source Oral     SpO2 98 %     Weight      Height      Head Circumference      Peak Flow      Pain Score 0     Pain Loc      Pain Education      Exclude from Growth Chart    No data found.  Updated Vital Signs BP 122/78 (BP Location: Right Arm)   Pulse 98   Temp 98.2 F (36.8 C) (Oral)   Resp 18   SpO2 98%   Visual Acuity Right Eye Distance:   Left Eye Distance:   Bilateral Distance:    Right Eye Near:   Left Eye Near:    Bilateral Near:     Physical Exam Vitals reviewed.  Constitutional:      General: She is awake. She is not in acute distress.    Appearance: Normal appearance. She is well-developed. She is not ill-appearing.     Comments: Very pleasant female appears stated age in no acute distress sitting comfortably in exam room  HENT:     Head: Normocephalic and atraumatic.     Right Ear: External ear normal. There is impacted cerumen.     Left Ear: Tympanic membrane, ear canal and external ear normal. Tympanic membrane is not erythematous or bulging.     Nose:     Right Sinus: No maxillary sinus tenderness or frontal sinus tenderness.     Left Sinus: No maxillary sinus tenderness or frontal sinus tenderness.     Mouth/Throat:     Pharynx: Uvula midline. No oropharyngeal exudate or posterior oropharyngeal erythema.  Cardiovascular:     Rate and Rhythm: Normal rate and regular rhythm.     Heart sounds: Normal heart sounds, S1 normal and S2 normal. No murmur heard. Pulmonary:     Effort: Pulmonary effort is normal.     Breath sounds: Examination of the right-lower field reveals decreased breath  sounds. Examination of the left-lower field reveals decreased breath sounds. Decreased breath sounds present. No wheezing, rhonchi or rales.  Psychiatric:        Behavior: Behavior is cooperative.      UC Treatments / Results  Labs (all labs ordered are listed, but only abnormal results are displayed) Labs Reviewed  POC COVID19/FLU A&B COMBO    EKG   Radiology No results found.  Procedures Procedures (including critical care time)  Medications Ordered in UC Medications - No data to display  Initial Impression / Assessment and Plan / UC Course  I have reviewed the triage vital signs and the nursing notes.  Pertinent labs & imaging results that were available during my care of the patient were reviewed by me and considered in my medical decision making (see chart for details).     Patient is well-appearing, afebrile, nontoxic, nontachycardic.  Viral testing was negative in clinic.  No evidence of acute infection on physical exam or warrant initiation of antibiotics.  Chest x-ray was deferred as she had no adventitious lung sounds on exam and her oxygen saturation was 98%.  Will provide symptomatic care.  She was given Tessalon  for diurnal cough and Hycodan for nocturnal cough as she reports Promethazine  DM causes her to have side effects including dry mouth that prevents her from sleeping.  We discussed that Hycodan contains an opioid and is therefore addictive and sedating.  She is not to drive or drink alcohol taking it and should limit  use is much as possible.  Review of   controlled substance database shows no inappropriate refills.  She can use over-the-counter medication including Mucinex , Flonase, Tylenol  for additional symptom relief.  Recommend she rest and drink plenty of fluid.  We discussed that if her symptoms are not improving within a week she should return for reevaluation.  If she has any worsening symptoms she needs to be seen immediately.  All questions  were answered patient satisfaction.  Final Clinical Impressions(s) / UC Diagnoses   Final diagnoses:  Viral URI with cough     Discharge Instructions      You tested negative for COVID and flu.  I suspect you have a different virus.  Use Tessalon  to help with your cough during the day and Hycodan at night.  This medication contains hydrocodone  which is addictive and sedating.  Please try to limit use is much as possible.  We are unable to provide a refill of this medicine.  I also recommend over-the-counter medication including Mucinex , Flonase, Tylenol .  Make sure that you rest and drink plenty of fluids.  If you are not feeling better in a week please return for reevaluation.  If anything worsens and you have high fever, worsening cough, shortness of breath, chest pain, nausea, vomiting you need to be seen immediately.   ED Prescriptions     Medication Sig Dispense Auth. Provider   benzonatate  (TESSALON ) 100 MG capsule Take 1 capsule (100 mg total) by mouth every 8 (eight) hours. 21 capsule Falicia Lizotte K, PA-C   HYDROcodone  bit-homatropine (HYCODAN) 5-1.5 MG/5ML syrup Take 5 mLs by mouth at bedtime. 25 mL Natalee Tomkiewicz K, PA-C      I have reviewed the PDMP during this encounter.   Budd Cargo, PA-C 04/11/24 1352

## 2024-04-17 ENCOUNTER — Ambulatory Visit (INDEPENDENT_AMBULATORY_CARE_PROVIDER_SITE_OTHER)

## 2024-04-17 ENCOUNTER — Ambulatory Visit (HOSPITAL_COMMUNITY): Admission: EM | Admit: 2024-04-17 | Discharge: 2024-04-17 | Disposition: A | Discharge status: Home / Self Care

## 2024-04-17 ENCOUNTER — Encounter (HOSPITAL_COMMUNITY): Payer: Self-pay | Admitting: *Deleted

## 2024-04-17 ENCOUNTER — Other Ambulatory Visit: Payer: Self-pay

## 2024-04-17 DIAGNOSIS — J189 Pneumonia, unspecified organism: Secondary | ICD-10-CM

## 2024-04-17 MED ORDER — AZELASTINE HCL 0.1 % NA SOLN
1.0000 | Freq: Two times a day (BID) | NASAL | 1 refills | Status: AC
Start: 2024-04-17 — End: ?

## 2024-04-17 MED ORDER — AZITHROMYCIN 250 MG PO TABS
ORAL_TABLET | ORAL | 0 refills | Status: AC
Start: 2024-04-17 — End: ?

## 2024-04-17 NOTE — ED Triage Notes (Signed)
 DOB and full name confirmed

## 2024-04-17 NOTE — Discharge Instructions (Addendum)
  1. Community acquired pneumonia of right lower lobe of lung - DG Chest 2 View x-ray performed in UC shows possible right lower lobe opacity possibly consistent with early pneumonia.  Final radiologist read still pending empiric treatment with azithromycin will be prescribed. - azelastine (ASTELIN) 0.1 % nasal spray; Place 1 spray into both nostrils 2 (two) times daily. Use in each nostril as directed  Dispense: 30 mL; Refill: 1 - azithromycin (ZITHROMAX Z-PAK) 250 MG tablet; Take 500 mg on day 1 followed by 250 mg for 4 days.  Take medication for a total of 5 days.  Dispense: 6 tablet; Refill: 0 -Continue to monitor symptoms for any change in severity if there is any escalation of current symptoms or development of new symptoms follow-up in ER for further evaluation and management.

## 2024-04-17 NOTE — ED Triage Notes (Signed)
 PT reports ongoing cough with out improvement. Pt has muscle pain when coughing. PT reports she was seen last week for same.

## 2024-04-17 NOTE — ED Provider Notes (Signed)
 [facility]  Note:  This document was prepared using Dragon voice recognition software and may include unintentional dictation errors.  MRN: [id number] DOB: July 10, 1976  Subjective:   [name] is a 48 y.o. female presenting for ongoing persistent cough x 2 weeks.  Patient reports intermittent chest pain with coughing and states that cough is much worse at night.  Patient reports that she was here last week and treated with cough suppressant medication however she states that symptoms are not improving and actually becoming somewhat worse.  Patient denies any severe shortness of breath, weakness, dizziness.  Patient does have mild headache.  Patient also reports that her husband has very similar symptoms and have not resolved.  Patient was prescribed Tessalon  Perles and prescription strength cough syrup with minimal improvement.  No current facility-administered medications for this encounter.  Current Outpatient Medications:    aspirin  EC 81 MG EC tablet, Take 1 tablet (81 mg total) by mouth daily., Disp: 90 tablet, Rfl: 1   azelastine (ASTELIN) 0.1 % nasal spray, Place 1 spray into both nostrils 2 (two) times daily. Use in each nostril as directed, Disp: 30 mL, Rfl: 1   azithromycin (ZITHROMAX Z-PAK) 250 MG tablet, Take 500 mg on day 1 followed by 250 mg for 4 days.  Take medication for a total of 5 days., Disp: 6 tablet, Rfl: 0   BIOTIN  PO, Take 1 tablet by mouth daily., Disp: , Rfl:    ELIQUIS 5 MG TABS tablet, TAKE ONE TABLET BY MOUTH EVERY 12 HOURS CAUTION BLOOD THINNER, Disp: , Rfl:    folic acid  (FOLVITE ) 1 MG tablet, Take 1 mg by mouth daily., Disp: , Rfl:    hydrochlorothiazide  (HYDRODIURIL ) 12.5 MG tablet, Take by mouth., Disp: , Rfl:    risperiDONE  (RISPERDAL ) 1 MG tablet, Take 3 mg by mouth at bedtime., Disp: , Rfl:    benztropine  (COGENTIN ) 0.5 MG tablet, Take 1 tablet (0.5 mg total) by mouth daily., Disp: 30 tablet, Rfl: 1   enoxaparin  (LOVENOX ) 100 MG/ML  injection, Inject 0.9 mLs (90 mg total) into the skin every 12 (twelve) hours., Disp: 60 mL, Rfl: 0   traZODone  (DESYREL ) 50 MG tablet, Take 1 tablet (50 mg total) by mouth at bedtime. (Patient taking differently: Take 100 mg by mouth at bedtime.), Disp: 30 tablet, Rfl: 1   Allergies  Allergen Reactions   Pork-Derived Products Other (See Comments)    Does not eat pork at all (agreeable to use Lovenox  08/11/13)    Past Medical History:  Diagnosis Date   Anemia    Anxiety    Arthritis    Takyasu Arthritis    Benign tumor of back    Carotid occlusion, left    DVT (deep venous thrombosis) (HCC)    GI bleed 04/2017   Hypertension    Menorrhagia    PVD (peripheral vascular disease) (HCC)    Stroke (cerebrum) (HCC)    Stroke (HCC)    left sided deficits   Swelling, lymph nodes 2006   intermittant, benign     Past Surgical History:  Procedure Laterality Date   BREAST SURGERY     bil breast implants   COLONOSCOPY WITH PROPOFOL  N/A 04/28/2017   Procedure: COLONOSCOPY WITH PROPOFOL ;  Surgeon: Albertina Hugger, MD;  Location: [facility];  Service: Endoscopy;  Laterality: N/A;   ivc filter placement     TrapEase IVC filter Safe 1.5T    Family History  Problem Relation Age of Onset  Cancer - Other Father    Coronary artery disease Mother    Stroke Mother    Hypertension Mother    Hypertension Sister    Colon cancer Neg Hx     Social History   Tobacco Use   Smoking status: Never   Smokeless tobacco: Never  Vaping Use   Vaping status: Never Used  Substance Use Topics   Alcohol use: No   Drug use: No    ROS Refer to HPI for ROS details.  Objective:   Vitals: BP 111/71   Pulse 96   Temp (!) 97.1 F (36.2 C)   Resp 20   LMP 04/14/2024   SpO2 94%   Physical Exam Vitals and nursing note reviewed.  Constitutional:      General: She is not in acute distress.    Appearance: She is well-developed. She is not ill-appearing or toxic-appearing.  HENT:     Head:  Normocephalic and atraumatic.     Nose: No congestion or rhinorrhea.     Mouth/Throat:     Mouth: Mucous membranes are moist.     Pharynx: Oropharynx is clear. No oropharyngeal exudate or posterior oropharyngeal erythema.  Cardiovascular:     Rate and Rhythm: Normal rate and regular rhythm.     Heart sounds: Normal heart sounds. No murmur heard. Pulmonary:     Effort: Pulmonary effort is normal. No respiratory distress.     Breath sounds: No stridor or decreased air movement. Examination of the left-lower field reveals decreased breath sounds. Decreased breath sounds present. No wheezing, rhonchi or rales.  Chest:     Chest wall: No tenderness.  Skin:    General: Skin is warm and dry.  Neurological:     General: No focal deficit present.     Mental Status: She is alert and oriented to person, place, and time.  Psychiatric:        Mood and Affect: Mood normal.        Behavior: Behavior normal.     Procedures  No results found for this or any previous visit (from the past 24 hours).  DG Chest 2 View Result Date: 04/17/2024 CLINICAL DATA:  Persisting cough. EXAM: CHEST - 2 VIEW COMPARISON:  05/31/2023 FINDINGS: Patient is rotated. The cardiomediastinal contours are unchanged allowing for rotation. Surgical clips project over the left upper mediastinum/thoracic inlet. Pulmonary vasculature is normal. No consolidation, pleural effusion, or pneumothorax. No acute osseous abnormalities are seen. IMPRESSION: No acute chest findings. Electronically Signed   By: Chadwick Colonel M.D.   On: 04/17/2024 14:24     Assessment and Plan :     Discharge Instructions       1. Community acquired pneumonia of right lower lobe of lung - DG Chest 2 View x-ray performed in UC shows possible right lower lobe opacity possibly consistent with early pneumonia.  Final radiologist read still pending empiric treatment with azithromycin will be prescribed. - azelastine (ASTELIN) 0.1 % nasal spray; Place 1  spray into both nostrils 2 (two) times daily. Use in each nostril as directed  Dispense: 30 mL; Refill: 1 - azithromycin (ZITHROMAX Z-PAK) 250 MG tablet; Take 500 mg on day 1 followed by 250 mg for 4 days.  Take medication for a total of 5 days.  Dispense: 6 tablet; Refill: 0 -Continue to monitor symptoms for any change in severity if there is any escalation of current symptoms or development of new symptoms follow-up in ER for further evaluation and management.  Zaxton Angerer B Tishanna Dunford   Talyssa Gibas, [place] B, [place] 04/17/24 1430

## 2024-04-19 ENCOUNTER — Emergency Department (HOSPITAL_COMMUNITY)

## 2024-04-19 ENCOUNTER — Other Ambulatory Visit: Payer: Self-pay

## 2024-04-19 ENCOUNTER — Emergency Department (HOSPITAL_COMMUNITY)
Admission: EM | Admit: 2024-04-19 | Discharge: 2024-04-19 | Disposition: A | Discharge status: Home / Self Care | Attending: Emergency Medicine | Admitting: Emergency Medicine

## 2024-04-19 DIAGNOSIS — J189 Pneumonia, unspecified organism: Secondary | ICD-10-CM

## 2024-04-19 DIAGNOSIS — R051 Acute cough: Secondary | ICD-10-CM | POA: Insufficient documentation

## 2024-04-19 DIAGNOSIS — E876 Hypokalemia: Secondary | ICD-10-CM | POA: Insufficient documentation

## 2024-04-19 DIAGNOSIS — Z7901 Long term (current) use of anticoagulants: Secondary | ICD-10-CM | POA: Diagnosis not present

## 2024-04-19 DIAGNOSIS — Z7982 Long term (current) use of aspirin: Secondary | ICD-10-CM | POA: Insufficient documentation

## 2024-04-19 DIAGNOSIS — Z79899 Other long term (current) drug therapy: Secondary | ICD-10-CM | POA: Insufficient documentation

## 2024-04-19 DIAGNOSIS — Z8673 Personal history of transient ischemic attack (TIA), and cerebral infarction without residual deficits: Secondary | ICD-10-CM | POA: Insufficient documentation

## 2024-04-19 DIAGNOSIS — I10 Essential (primary) hypertension: Secondary | ICD-10-CM | POA: Diagnosis not present

## 2024-04-19 LAB — BASIC METABOLIC PANEL WITH GFR
Anion gap: 10 (ref 5–15)
BUN: 8 mg/dL (ref 6–20)
CO2: 28 mmol/L (ref 22–32)
Calcium: 9.7 mg/dL (ref 8.9–10.3)
Chloride: 103 mmol/L (ref 98–111)
Creatinine, Ser: 1.13 mg/dL — ABNORMAL HIGH (ref 0.44–1.00)
GFR, Estimated: 60 mL/min — ABNORMAL LOW (ref >60)
Glucose, Bld: 131 mg/dL — ABNORMAL HIGH (ref 70–99)
Potassium: 2.7 mmol/L — CL (ref 3.5–5.1)
Sodium: 141 mmol/L (ref 135–145)

## 2024-04-19 LAB — I-STAT CG4 LACTIC ACID, ED: Lactic Acid, Venous: 1.3 mmol/L (ref 0.5–1.9)

## 2024-04-19 LAB — CBC
HCT: 39.9 % (ref 36.0–46.0)
Hemoglobin: 14.2 g/dL (ref 12.0–15.0)
MCH: 32.4 pg (ref 26.0–34.0)
MCHC: 35.6 g/dL (ref 30.0–36.0)
MCV: 91.1 fL (ref 80.0–100.0)
Platelets: 392 10*3/uL (ref 150–400)
RBC: 4.38 MIL/uL (ref 3.87–5.11)
RDW: 13.8 % (ref 11.5–15.5)
WBC: 10.1 10*3/uL (ref 4.0–10.5)
nRBC: 0 % (ref 0.0–0.2)

## 2024-04-19 LAB — HCG, SERUM, QUALITATIVE: Preg, Serum: NEGATIVE

## 2024-04-19 LAB — MAGNESIUM: Magnesium: 2.5 mg/dL — ABNORMAL HIGH (ref 1.7–2.4)

## 2024-04-19 MED ORDER — SODIUM CHLORIDE 0.9 % IV BOLUS
1000.0000 mL | Freq: Once | INTRAVENOUS | Status: AC
  Administered 2024-04-19: 1000 mL via INTRAVENOUS

## 2024-04-19 MED ORDER — AMOXICILLIN-POT CLAVULANATE 875-125 MG PO TABS: 1.0000 | ORAL_TABLET | Freq: Two times a day (BID) | ORAL | 0 refills | Status: AC

## 2024-04-19 MED ORDER — IPRATROPIUM-ALBUTEROL 0.5-2.5 (3) MG/3ML IN SOLN
3.0000 mL | Freq: Once | RESPIRATORY_TRACT | Status: AC
  Administered 2024-04-19: 3 mL via RESPIRATORY_TRACT
  Filled 2024-04-19: qty 3

## 2024-04-19 MED ORDER — HYDROCOD POLI-CHLORPHE POLI ER 10-8 MG/5ML PO SUER
5.0000 mL | Freq: Once | ORAL | Status: AC
  Administered 2024-04-19: 5 mL via ORAL
  Filled 2024-04-19: qty 5

## 2024-04-19 MED ORDER — ALBUTEROL SULFATE HFA 108 (90 BASE) MCG/ACT IN AERS
2.0000 | INHALATION_SPRAY | Freq: Once | RESPIRATORY_TRACT | Status: AC
  Administered 2024-04-19: 2 via RESPIRATORY_TRACT
  Filled 2024-04-19: qty 6.7

## 2024-04-19 MED ORDER — IOHEXOL 350 MG/ML SOLN
75.0000 mL | Freq: Once | INTRAVENOUS | Status: AC | PRN
  Administered 2024-04-19: 75 mL via INTRAVENOUS

## 2024-04-19 MED ORDER — SODIUM CHLORIDE 0.9 % IV SOLN
1.0000 g | Freq: Once | INTRAVENOUS | Status: AC
  Administered 2024-04-19: 1 g via INTRAVENOUS
  Filled 2024-04-19: qty 10

## 2024-04-19 MED ORDER — HYDROCOD POLI-CHLORPHE POLI ER 10-8 MG/5ML PO SUER: 5.0000 mL | Freq: Two times a day (BID) | ORAL | 0 refills | Status: AC | PRN

## 2024-04-19 MED ORDER — POTASSIUM CHLORIDE CRYS ER 20 MEQ PO TBCR
40.0000 meq | EXTENDED_RELEASE_TABLET | Freq: Once | ORAL | Status: AC
  Administered 2024-04-19: 40 meq via ORAL
  Filled 2024-04-19: qty 2

## 2024-04-19 MED ORDER — AMOXICILLIN-POT CLAVULANATE 875-125 MG PO TABS
1.0000 | ORAL_TABLET | Freq: Once | ORAL | Status: AC
  Administered 2024-04-19: 1 via ORAL
  Filled 2024-04-19: qty 1

## 2024-04-19 MED ORDER — ALBUTEROL SULFATE HFA 108 (90 BASE) MCG/ACT IN AERS: 2.0000 | INHALATION_SPRAY | RESPIRATORY_TRACT | 0 refills | Status: AC | PRN

## 2024-04-19 MED ORDER — SODIUM CHLORIDE 0.9 % IV SOLN
500.0000 mg | Freq: Once | INTRAVENOUS | Status: AC
  Administered 2024-04-19: 500 mg via INTRAVENOUS
  Filled 2024-04-19: qty 5

## 2024-04-19 MED ORDER — POTASSIUM CHLORIDE 10 MEQ/100ML IV SOLN
10.0000 meq | Freq: Once | INTRAVENOUS | Status: AC
  Administered 2024-04-19: 10 meq via INTRAVENOUS
  Filled 2024-04-19: qty 100

## 2024-04-19 NOTE — ED Provider Notes (Signed)
 [facility] AT [facility] Provider Note   CSN: [id number] Arrival date & time: 04/19/24  1142     History  Chief Complaint  Patient presents with   Shortness of Breath    [name] is a 48 y.o. female.  Pt is a 48 yo female with pmhx significant for PVD, DVT (on Eliquis), hx CVA with left sided weakness, gi bleed, htn, and anemia.  Pt has had a cough for several weeks.  She went to UC on 5/17 and was d/c with tessalon  perles.  She went back on 5/23 and was dx'd with pna and put on zithromax.  She feels like she is not any better.  She can't sleep due to coughing so much.  No fevers/chills.       Home Medications Prior to Admission medications   Medication Sig Start Date End Date Taking? Authorizing Provider  aspirin  EC 81 MG EC tablet Take 1 tablet (81 mg total) by mouth daily. 07/01/15   Burnell Carry, MD  azelastine (ASTELIN) 0.1 % nasal spray Place 1 spray into both nostrils 2 (two) times daily. Use in each nostril as directed 04/17/24   Reddick, Johnathan B, NP  azithromycin (ZITHROMAX Z-PAK) 250 MG tablet Take 500 mg on day 1 followed by 250 mg for 4 days.  Take medication for a total of 5 days. 04/17/24   Reddick, Johnathan B, NP  benztropine  (COGENTIN ) 0.5 MG tablet Take 1 tablet (0.5 mg total) by mouth daily. 03/23/21 09/02/21  Money, Christella Coventry, FNP  BIOTIN  PO Take 1 tablet by mouth daily.    [provider]  ELIQUIS 5 MG TABS tablet TAKE ONE TABLET BY MOUTH EVERY 12 HOURS CAUTION BLOOD THINNER 08/07/22 04/17/24  [provider]  enoxaparin  (LOVENOX ) 100 MG/ML injection Inject 0.9 mLs (90 mg total) into the skin every 12 (twelve) hours. 07/24/21   Tonya Fredrickson, MD  folic acid  (FOLVITE ) 1 MG tablet Take 1 mg by mouth daily.    [provider]  hydrochlorothiazide  (HYDRODIURIL ) 12.5 MG tablet Take by mouth. 01/23/23   [provider]  risperiDONE  (RISPERDAL ) 1 MG tablet Take 3 mg by mouth at bedtime.     [provider]  traZODone  (DESYREL ) 50 MG tablet Take 1 tablet (50 mg total) by mouth at bedtime. Patient taking differently: Take 100 mg by mouth at bedtime. 03/23/21   Money, Christella Coventry, FNP      Allergies    Pork-derived products    Review of Systems   Review of Systems  Respiratory:  Positive for cough and shortness of breath.   All other systems reviewed and are negative.   Physical Exam Updated Vital Signs BP 124/73   Pulse 85   Temp 98 F (36.7 C) (Oral)   Resp 18   Ht 5\' 9"  (1.753 m)   Wt 92.1 kg   LMP 04/14/2024   SpO2 100%   BMI 29.98 kg/m  Physical Exam Vitals and nursing note reviewed.  Constitutional:      Appearance: She is well-developed.  HENT:     Head: Normocephalic and atraumatic.     Mouth/Throat:     Mouth: Mucous membranes are moist.     Pharynx: Oropharynx is clear.  Eyes:     Extraocular Movements: Extraocular movements intact.     Pupils: Pupils are equal, round, and reactive to light.  Cardiovascular:     Rate and Rhythm: Normal rate and regular rhythm.  Pulmonary:  Effort: Pulmonary effort is normal.     Breath sounds: Rhonchi present.  Abdominal:     General: Bowel sounds are normal.     Palpations: Abdomen is soft.  Musculoskeletal:        General: Normal range of motion.     Cervical back: Normal range of motion and neck supple.     Comments: Left arm contracture  Skin:    General: Skin is warm.     Capillary Refill: Capillary refill takes less than 2 seconds.  Neurological:     Mental Status: She is alert and oriented to person, place, and time.     Comments: Left arm paralysis, left leg weak  Psychiatric:        Mood and Affect: Mood normal.        Behavior: Behavior normal.     ED Results / Procedures / Treatments   Labs (all labs ordered are listed, but only abnormal results are displayed) Labs Reviewed  BASIC METABOLIC PANEL WITH GFR - Abnormal; Notable for the following components:      Result Value    Potassium 2.7 (*)    Glucose, Bld 131 (*)    Creatinine, Ser 1.13 (*)    GFR, Estimated 60 (*)    All other components within normal limits  MAGNESIUM - Abnormal; Notable for the following components:   Magnesium 2.5 (*)    All other components within normal limits  CULTURE, BLOOD (ROUTINE X 2)  CULTURE, BLOOD (ROUTINE X 2)  RESP PANEL BY RT-PCR (RSV, FLU A&B, COVID)  RVPGX2  CBC  HCG, SERUM, QUALITATIVE  URINALYSIS, ROUTINE W REFLEX MICROSCOPIC  I-STAT CG4 LACTIC ACID, ED  I-STAT CG4 LACTIC ACID, ED    EKG EKG Interpretation Date/Time:  Sunday Apr 19 2024 12:36:08 EDT Ventricular Rate:  82 PR Interval:  158 QRS Duration:  76 QT Interval:  416 QTC Calculation: 486 R Axis:   55  Text Interpretation: Normal sinus rhythm Nonspecific T wave abnormality Prolonged QT Abnormal ECG When compared with ECG of 24-Jul-2021 16:21, PREVIOUS ECG IS PRESENT Confirmed by Sueellen Emery [phone]) on 04/19/2024 3:12:16 PM  Radiology DG Chest 2 View Result Date: 04/19/2024 CLINICAL DATA:  Shortness of breath EXAM: CHEST - 2 VIEW COMPARISON:  X-ray 04/17/2024.  Older exams as well FINDINGS: Minimal linear opacity left midlung likely atelectasis, increased from previous. No pneumothorax, effusion or edema. Normal cardiopericardial silhouette. Air-fluid level along the stomach beneath the left hemidiaphragm. IMPRESSION: New left midlung atelectasis. Electronically Signed   By: Adrianna Horde M.D.   On: 04/19/2024 13:21    Procedures Procedures    Medications Ordered in ED Medications  azithromycin (ZITHROMAX) 500 mg in sodium chloride  0.9 % 250 mL IVPB (500 mg Intravenous New Bag/Given 04/19/24 1501)  potassium chloride  SA (KLOR-CON  M) CR tablet 40 mEq (has no administration in time range)  potassium chloride  10 mEq in 100 mL IVPB (has no administration in time range)  sodium chloride  0.9 % bolus 1,000 mL (has no administration in time range)  sodium chloride  0.9 % bolus 1,000 mL (1,000 mLs  Intravenous New Bag/Given 04/19/24 1458)  cefTRIAXone (ROCEPHIN) 1 g in sodium chloride  0.9 % 100 mL IVPB (1 g Intravenous New Bag/Given 04/19/24 1459)  ipratropium-albuterol (DUONEB) 0.5-2.5 (3) MG/3ML nebulizer solution 3 mL (3 mLs Nebulization Given 04/19/24 1434)  chlorpheniramine-HYDROcodone  (TUSSIONEX) 10-8 MG/5ML suspension 5 mL (5 mLs Oral Given 04/19/24 1431)    ED Course/ Medical Decision Making/ A&P  Medical Decision Making Amount and/or Complexity of Data Reviewed Labs: ordered. Radiology: ordered.  Risk Prescription drug management.   This patient presents to the ED for concern of cough, this involves an extensive number of treatment options, and is a complaint that carries with it a high risk of complications and morbidity.  The differential diagnosis includes pna, covid/flu/rsv, bronchitis, pe   Co morbidities that complicate the patient evaluation  PVD, DVT (on Eliquis), hx CVA with left sided weakness, gi bleed, htn, and anemia   Additional history obtained:  Additional history obtained from epic chart review External records from outside source obtained and reviewed including husband   Lab Tests:  I Ordered, and personally interpreted labs.  The pertinent results include:  preg neg, cbc nl, bmp with k low at 2.7, mg nl, lactic nl   Imaging Studies ordered:  I ordered imaging studies including cxr, ct chest  I independently visualized and interpreted imaging which showed  CXR: New left midlung atelectasis.  CT chest pending at shift change I agree with the radiologist interpretation   Cardiac Monitoring:  The patient was maintained on a cardiac monitor.  I personally viewed and interpreted the cardiac monitored which showed an underlying rhythm of: nsr   Medicines ordered and prescription drug management:  I ordered medication including ivfs/abx  for sx  Reevaluation of the patient after these medicines showed that  the patient improved I have reviewed the patients home medicines and have made adjustments as needed   Test Considered:  ct   Critical Interventions:  abx  Problem List / ED Course:  Bronchitis vs pna:  pt given iv abx, nebs, and tussionex.  CT pending. Hypokalemia: iv kcl and oral kdur given   Reevaluation:  After the interventions noted above, I reevaluated the patient and found that they have :improved   Social Determinants of Health:  Lives at home   Dispostion:  Pending at shift change        Final Clinical Impression(s) / ED Diagnoses Final diagnoses:  Hypokalemia  Acute cough    Rx / DC Orders ED Discharge Orders     None         Sueellen Emery, MD 04/19/24 1540

## 2024-04-19 NOTE — Discharge Instructions (Addendum)
 You were seen in the emergency department for your shortness of breath and your cough.  You do have a pneumonia on your left lower lung.  I have given you a second antibiotic that you should take in addition to the antibiotic that you are prescribed at urgent care and you should complete these as prescribed.  We have given you an albuterol inhaler and another cough medication that you can take as needed for your cough.  That Tussionex does come contain narcotics and this can make you drowsy.  Do not take it before driving, working or operating heavy machinery.  Your lab work also showed that your potassium level was low today and we did give you repletion in the ER.  You should follow-up with your primary doctor in the next few days to have your symptoms and potassium levels rechecked.  You should return to the emergency department if you are having fevers despite the antibiotics, worsening shortness of breath or any other new or concerning symptoms.

## 2024-04-19 NOTE — ED Provider Notes (Signed)
 Patient signed out to me at 1530 by Dr. Scarlette Currier pending CTPE study. In short this is a 48 year old female with PMH Takayasu's arteritis, prior CVA, VTE on lovenox  that presented to the emergency department with SOB and cough. Was recently treated for pneumonia with azithromycin without improvement of symptoms. Work up here showed hypokalemia, otherwise within normal range. Potassium was repleted and she was given a dose of IV antibiotics. CTPE study negative for PE and does show LLL pneumonia, otherwise no new findings.  On my evaluation, the patient reports no significant change in her shortness of breath or cough.  She does have a bronchospastic cough on exam, otherwise lungs sound clear.  Will be given an albuterol inhaler and will be started on augmentin  and is otherwise stable for discharge home with outpatient follow up.   Kingsley, Huxton Glaus K, DO 04/19/24 1714

## 2024-04-19 NOTE — ED Triage Notes (Signed)
 Pt was dx with PNA on Friday and is compliant with ABX. Cough is getting worse and SOB. Cough is productive. Does not feel fever and chills. Pt voided this morning at 0600 and felt like she emptied her bladder. Then at 1000 pt was unable to void. Pt is not continent due to stroke and usually voids every 3 hours. Pt has not voided since 0600 this morning despite trying.

## 2024-04-24 LAB — CULTURE, BLOOD (ROUTINE X 2)
Culture: NO GROWTH
Culture: NO GROWTH
Special Requests: ADEQUATE
